# Patient Record
Sex: Male | Born: 1960 | Race: Black or African American | Hispanic: No | Smoking: Never smoker
Health system: Southern US, Community
[De-identification: ages and names within clinical notes are randomized; demographics above are authoritative.]

## PROBLEM LIST (undated history)

## (undated) DIAGNOSIS — T4145XA Adverse effect of unspecified anesthetic, initial encounter: Secondary | ICD-10-CM

## (undated) DIAGNOSIS — T8859XA Other complications of anesthesia, initial encounter: Secondary | ICD-10-CM

## (undated) DIAGNOSIS — I639 Cerebral infarction, unspecified: Secondary | ICD-10-CM

## (undated) DIAGNOSIS — I10 Essential (primary) hypertension: Secondary | ICD-10-CM

## (undated) DIAGNOSIS — D649 Anemia, unspecified: Secondary | ICD-10-CM

## (undated) DIAGNOSIS — I330 Acute and subacute infective endocarditis: Secondary | ICD-10-CM

## (undated) DIAGNOSIS — I480 Paroxysmal atrial fibrillation: Secondary | ICD-10-CM

## (undated) DIAGNOSIS — N289 Disorder of kidney and ureter, unspecified: Secondary | ICD-10-CM

## (undated) DIAGNOSIS — N183 Chronic kidney disease, stage 3 (moderate): Secondary | ICD-10-CM

## (undated) HISTORY — PX: DIALYSIS FISTULA CREATION: SHX611

---
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
## 2003-10-26 ENCOUNTER — Emergency Department (HOSPITAL_COMMUNITY): Admission: AD | Admit: 2003-10-26 | Discharge: 2003-10-26 | Payer: Self-pay | Admitting: Family Medicine

## 2005-01-10 ENCOUNTER — Ambulatory Visit: Payer: Self-pay | Admitting: Family Medicine

## 2005-02-14 ENCOUNTER — Ambulatory Visit: Payer: Self-pay | Admitting: Family Medicine

## 2005-03-07 ENCOUNTER — Ambulatory Visit: Payer: Self-pay | Admitting: Family Medicine

## 2005-04-18 ENCOUNTER — Ambulatory Visit: Payer: Self-pay | Admitting: Family Medicine

## 2005-05-03 ENCOUNTER — Ambulatory Visit: Payer: Self-pay

## 2005-10-16 ENCOUNTER — Ambulatory Visit: Payer: Self-pay | Admitting: Family Medicine

## 2013-09-17 ENCOUNTER — Inpatient Hospital Stay (HOSPITAL_COMMUNITY)
Admission: AD | Admit: 2013-09-17 | Discharge: 2013-09-23 | DRG: 673 | Disposition: A | Discharge status: Home / Self Care | Payer: Medicaid Other | Source: Other Acute Inpatient Hospital | Attending: Internal Medicine | Admitting: Internal Medicine

## 2013-09-17 DIAGNOSIS — E669 Obesity, unspecified: Secondary | ICD-10-CM | POA: Diagnosis present

## 2013-09-17 DIAGNOSIS — Z8249 Family history of ischemic heart disease and other diseases of the circulatory system: Secondary | ICD-10-CM

## 2013-09-17 DIAGNOSIS — N186 End stage renal disease: Secondary | ICD-10-CM | POA: Diagnosis present

## 2013-09-17 DIAGNOSIS — Z6836 Body mass index (BMI) 36.0-36.9, adult: Secondary | ICD-10-CM

## 2013-09-17 DIAGNOSIS — N2581 Secondary hyperparathyroidism of renal origin: Secondary | ICD-10-CM | POA: Diagnosis present

## 2013-09-17 DIAGNOSIS — D649 Anemia, unspecified: Secondary | ICD-10-CM | POA: Diagnosis present

## 2013-09-17 DIAGNOSIS — I10 Essential (primary) hypertension: Secondary | ICD-10-CM | POA: Diagnosis present

## 2013-09-17 DIAGNOSIS — Z992 Dependence on renal dialysis: Secondary | ICD-10-CM

## 2013-09-17 DIAGNOSIS — D638 Anemia in other chronic diseases classified elsewhere: Secondary | ICD-10-CM | POA: Diagnosis present

## 2013-09-17 DIAGNOSIS — N289 Disorder of kidney and ureter, unspecified: Secondary | ICD-10-CM | POA: Diagnosis present

## 2013-09-17 DIAGNOSIS — N179 Acute kidney failure, unspecified: Secondary | ICD-10-CM | POA: Diagnosis present

## 2013-09-17 DIAGNOSIS — I120 Hypertensive chronic kidney disease with stage 5 chronic kidney disease or end stage renal disease: Principal | ICD-10-CM | POA: Diagnosis present

## 2013-09-17 HISTORY — DX: Essential (primary) hypertension: I10

## 2013-09-17 HISTORY — DX: Chronic kidney disease, stage 3 (moderate): N18.3

## 2013-09-17 NOTE — Plan of Care (Signed)
Name: LUM PFAB MRN: [id number]  PCP: No PCP  HPI: Mr. Rasso is a 53 year old gentleman with history of hypertension, has been on lisinopril in the past.  Presented to [facility] with malaise and weakness.  Found to have elevated blood pressure of 250/143.  Patient received clonidine and ACE inhibitor for elevated blood pressure.  Later found to have abnormal labs with a creatinine of 14.3, previously 2012 was 1.88.  Anemic with a hemoglobin of 7.5, Previously was 13.  Requested transfer to [facility] for acute renal failure.  Dr. Justin Mend with nephrology notified. Had a borderline elevated troponin of 0.4, no EKG changes, normal sinus rhythm with LVH.  Vitals:Most recent 98.8, 97, 20, blood pressure 226/124, 99% on RA.  Labs: Sodium 142, potassium 5.3, chloride 106, CO2 18, BUN 90, creatinine 14.3 ( previously in 2012 was 1.88), glucose 83, WBC 8.8, hemoglobin 7.5, platelet count 187, normal liver function tests, INR 1.2, chest x-ray shows pulmonary vascular congestion.  Requested tests: urine drug screen and stool guaiac, still pending.  Bed request: Admit to step down, MC10.  Juanpablo Ciresi A, MD 09/17/2013, 9:06 PM

## 2013-09-18 ENCOUNTER — Inpatient Hospital Stay (HOSPITAL_COMMUNITY): Payer: Medicaid Other

## 2013-09-18 ENCOUNTER — Encounter (HOSPITAL_COMMUNITY): Payer: Self-pay | Admitting: Internal Medicine

## 2013-09-18 DIAGNOSIS — N179 Acute kidney failure, unspecified: Secondary | ICD-10-CM | POA: Diagnosis present

## 2013-09-18 DIAGNOSIS — N183 Chronic kidney disease, stage 3 unspecified: Secondary | ICD-10-CM

## 2013-09-18 DIAGNOSIS — T82898A Other specified complication of vascular prosthetic devices, implants and grafts, initial encounter: Secondary | ICD-10-CM

## 2013-09-18 DIAGNOSIS — I10 Essential (primary) hypertension: Secondary | ICD-10-CM | POA: Diagnosis present

## 2013-09-18 DIAGNOSIS — D649 Anemia, unspecified: Secondary | ICD-10-CM

## 2013-09-18 LAB — HEPATITIS C ANTIBODY (REFLEX): HCV Ab: NEGATIVE

## 2013-09-18 LAB — CBC WITH DIFFERENTIAL/PLATELET
BASOS PCT: 0 % (ref 0–1)
Basophils Absolute: 0 10*3/uL (ref 0.0–0.1)
EOS ABS: 0.1 10*3/uL (ref 0.0–0.7)
EOS PCT: 2 % (ref 0–5)
HEMATOCRIT: 20.5 % — AB (ref 39.0–52.0)
HEMOGLOBIN: 6.7 g/dL — AB (ref 13.0–17.0)
LYMPHS PCT: 14 % (ref 12–46)
Lymphs Abs: 1 10*3/uL (ref 0.7–4.0)
MCH: 27.1 pg (ref 26.0–34.0)
MCHC: 32.7 g/dL (ref 30.0–36.0)
MCV: 83 fL (ref 78.0–100.0)
Monocytes Absolute: 0.3 10*3/uL (ref 0.1–1.0)
Monocytes Relative: 4 % (ref 3–12)
NEUTROS ABS: 5.6 10*3/uL (ref 1.7–7.7)
Neutrophils Relative %: 80 % — ABNORMAL HIGH (ref 43–77)
Platelets: 190 10*3/uL (ref 150–400)
RBC: 2.47 MIL/uL — AB (ref 4.22–5.81)
RDW: 16.3 % — ABNORMAL HIGH (ref 11.5–15.5)
WBC: 7 10*3/uL (ref 4.0–10.5)

## 2013-09-18 LAB — BASIC METABOLIC PANEL
BUN: 93 mg/dL — ABNORMAL HIGH (ref 6–23)
CO2: 16 meq/L — AB (ref 19–32)
CREATININE: 13.85 mg/dL — AB (ref 0.50–1.35)
Calcium: 8.8 mg/dL (ref 8.4–10.5)
Chloride: 103 mEq/L (ref 96–112)
GFR calc non Af Amer: 4 mL/min — ABNORMAL LOW (ref >90)
GFR, EST AFRICAN AMERICAN: 4 mL/min — AB (ref >90)
GLUCOSE: 115 mg/dL — AB (ref 70–99)
POTASSIUM: 5.1 meq/L (ref 3.7–5.3)
Sodium: 139 mEq/L (ref 137–147)

## 2013-09-18 LAB — ABO/RH: ABO/RH(D): O POS

## 2013-09-18 LAB — URINE MICROSCOPIC-ADD ON

## 2013-09-18 LAB — URINALYSIS, ROUTINE W REFLEX MICROSCOPIC
Bilirubin Urine: NEGATIVE
Glucose, UA: NEGATIVE mg/dL
KETONES UR: NEGATIVE mg/dL
Leukocytes, UA: NEGATIVE
Nitrite: NEGATIVE
PROTEIN: 100 mg/dL — AB
Specific Gravity, Urine: 1.01 (ref 1.005–1.030)
Urobilinogen, UA: 0.2 mg/dL (ref 0.0–1.0)
pH: 6.5 (ref 5.0–8.0)

## 2013-09-18 LAB — TROPONIN I
Troponin I: <0.3 ng/mL (ref <0.30)
Troponin I: <0.3 ng/mL (ref <0.30)

## 2013-09-18 LAB — COMPREHENSIVE METABOLIC PANEL
ALBUMIN: 3.6 g/dL (ref 3.5–5.2)
ALK PHOS: 66 U/L (ref 39–117)
ALT: 20 U/L (ref 0–53)
AST: 9 U/L (ref 0–37)
BUN: 93 mg/dL — AB (ref 6–23)
CHLORIDE: 103 meq/L (ref 96–112)
CO2: 17 mEq/L — ABNORMAL LOW (ref 19–32)
CREATININE: 13.75 mg/dL — AB (ref 0.50–1.35)
Calcium: 8.9 mg/dL (ref 8.4–10.5)
GFR calc Af Amer: 4 mL/min — ABNORMAL LOW (ref >90)
GFR, EST NON AFRICAN AMERICAN: 4 mL/min — AB (ref >90)
Glucose, Bld: 115 mg/dL — ABNORMAL HIGH (ref 70–99)
POTASSIUM: 5.2 meq/L (ref 3.7–5.3)
Sodium: 139 mEq/L (ref 137–147)
Total Bilirubin: 0.4 mg/dL (ref 0.3–1.2)
Total Protein: 6.7 g/dL (ref 6.0–8.3)

## 2013-09-18 LAB — PROTEIN / CREATININE RATIO, URINE
CREATININE, URINE: 130.52 mg/dL
Protein Creatinine Ratio: 0.38 — ABNORMAL HIGH (ref 0.00–0.15)
Total Protein, Urine: 50.2 mg/dL

## 2013-09-18 LAB — PROTIME-INR
INR: 1.42 (ref 0.00–1.49)
PROTHROMBIN TIME: 17 s — AB (ref 11.6–15.2)

## 2013-09-18 LAB — IRON AND TIBC
Iron: 34 ug/dL — ABNORMAL LOW (ref 42–135)
Saturation Ratios: 15 % — ABNORMAL LOW (ref 20–55)
TIBC: 230 ug/dL (ref 215–435)
UIBC: 196 ug/dL (ref 125–400)

## 2013-09-18 LAB — RETICULOCYTES
RBC.: 2.48 MIL/uL — ABNORMAL LOW (ref 4.22–5.81)
RETIC COUNT ABSOLUTE: 27.3 10*3/uL (ref 19.0–186.0)
RETIC CT PCT: 1.1 % (ref 0.4–3.1)

## 2013-09-18 LAB — HEPATITIS B SURFACE ANTIGEN: HEP B S AG: NEGATIVE

## 2013-09-18 LAB — FOLATE: Folate: 9.4 ng/mL

## 2013-09-18 LAB — HIV ANTIBODY (ROUTINE TESTING W REFLEX): HIV: NONREACTIVE

## 2013-09-18 LAB — PREPARE RBC (CROSSMATCH)

## 2013-09-18 LAB — VITAMIN B12: VITAMIN B 12: 397 pg/mL (ref 211–911)

## 2013-09-18 LAB — MRSA PCR SCREENING: MRSA by PCR: NEGATIVE

## 2013-09-18 LAB — FERRITIN: FERRITIN: 63 ng/mL (ref 22–322)

## 2013-09-18 MED ORDER — DARBEPOETIN ALFA-POLYSORBATE 150 MCG/0.3ML IJ SOLN
150.0000 ug | INTRAMUSCULAR | Status: DC
  Filled 2013-09-18: qty 0.3

## 2013-09-18 MED ORDER — ONDANSETRON HCL 4 MG PO TABS
4.0000 mg | ORAL_TABLET | Freq: Four times a day (QID) | ORAL | Status: DC | PRN
  Administered 2013-09-18 – 2013-09-19 (×2): 4 mg via ORAL
  Filled 2013-09-18 (×2): qty 1

## 2013-09-18 MED ORDER — SODIUM CHLORIDE 0.9 % IV SOLN
100.0000 mL | INTRAVENOUS | Status: DC | PRN
Start: 2013-09-18 — End: 2013-09-18

## 2013-09-18 MED ORDER — ONDANSETRON HCL 4 MG/2ML IJ SOLN: 4.0000 mg | Freq: Four times a day (QID) | INTRAMUSCULAR | Status: DC | PRN

## 2013-09-18 MED ORDER — LIDOCAINE HCL (PF) 1 % IJ SOLN
5.0000 mL | INTRAMUSCULAR | Status: DC | PRN
Start: 2013-09-18 — End: 2013-09-18

## 2013-09-18 MED ORDER — CLONIDINE HCL 0.1 MG PO TABS
0.1000 mg | ORAL_TABLET | Freq: Three times a day (TID) | ORAL | Status: DC | PRN
  Administered 2013-09-18: 0.1 mg via ORAL
  Filled 2013-09-18: qty 1

## 2013-09-18 MED ORDER — LIDOCAINE-PRILOCAINE 2.5-2.5 % EX CREA
1.0000 | TOPICAL_CREAM | CUTANEOUS | Status: DC | PRN
Start: 2013-09-18 — End: 2013-09-18
  Filled 2013-09-18: qty 5

## 2013-09-18 MED ORDER — NEPRO/CARBSTEADY PO LIQD
237.0000 mL | Freq: Two times a day (BID) | ORAL | Status: DC | PRN
  Filled 2013-09-18: qty 237

## 2013-09-18 MED ORDER — AMLODIPINE BESYLATE 5 MG PO TABS
5.0000 mg | ORAL_TABLET | Freq: Every day | ORAL | Status: DC
  Administered 2013-09-18: 5 mg via ORAL
  Filled 2013-09-18: qty 1

## 2013-09-18 MED ORDER — HEPARIN SODIUM (PORCINE) 1000 UNIT/ML DIALYSIS: 1000.0000 [IU] | INTRAMUSCULAR | Status: DC | PRN

## 2013-09-18 MED ORDER — ACETAMINOPHEN 650 MG RE SUPP: 650.0000 mg | Freq: Four times a day (QID) | RECTAL | Status: DC | PRN

## 2013-09-18 MED ORDER — BOOST / RESOURCE BREEZE PO LIQD
1.0000 | Freq: Two times a day (BID) | ORAL | Status: DC
  Administered 2013-09-20 – 2013-09-23 (×4): 1 via ORAL

## 2013-09-18 MED ORDER — PENTAFLUOROPROP-TETRAFLUOROETH EX AERO
1.0000 | INHALATION_SPRAY | CUTANEOUS | Status: DC | PRN
Start: 2013-09-18 — End: 2013-09-18

## 2013-09-18 MED ORDER — HEPARIN SODIUM (PORCINE) 5000 UNIT/ML IJ SOLN
5000.0000 [IU] | Freq: Three times a day (TID) | INTRAMUSCULAR | Status: DC
  Administered 2013-09-18 – 2013-09-23 (×13): 5000 [IU] via SUBCUTANEOUS
  Filled 2013-09-18 (×19): qty 1

## 2013-09-18 MED ORDER — ALTEPLASE 2 MG IJ SOLR
2.0000 mg | Freq: Once | INTRAMUSCULAR | Status: DC | PRN
  Filled 2013-09-18: qty 2

## 2013-09-18 MED ORDER — AMLODIPINE BESYLATE 10 MG PO TABS
10.0000 mg | ORAL_TABLET | Freq: Every day | ORAL | Status: DC
  Administered 2013-09-19 – 2013-09-23 (×4): 10 mg via ORAL
  Filled 2013-09-18 (×5): qty 1

## 2013-09-18 MED ORDER — ACETAMINOPHEN 325 MG PO TABS
650.0000 mg | ORAL_TABLET | Freq: Four times a day (QID) | ORAL | Status: DC | PRN
  Filled 2013-09-18 (×2): qty 2

## 2013-09-18 MED ORDER — HYDRALAZINE HCL 20 MG/ML IJ SOLN
10.0000 mg | Freq: Four times a day (QID) | INTRAMUSCULAR | Status: DC | PRN
  Administered 2013-09-18 – 2013-09-19 (×2): 10 mg via INTRAVENOUS
  Filled 2013-09-18 (×2): qty 1

## 2013-09-18 MED ORDER — SODIUM CHLORIDE 0.9 % IV SOLN
INTRAVENOUS | Status: AC
  Administered 2013-09-18: 02:00:00 via INTRAVENOUS

## 2013-09-18 MED ORDER — NEPRO/CARBSTEADY PO LIQD
237.0000 mL | ORAL | Status: DC | PRN
  Filled 2013-09-18: qty 237

## 2013-09-18 MED ORDER — SODIUM CHLORIDE 0.9 % IV SOLN: 100.0000 mL | INTRAVENOUS | Status: DC | PRN

## 2013-09-18 NOTE — Progress Notes (Signed)
Pt taken to HD this am for cath placement & treatment, VSS.

## 2013-09-18 NOTE — Progress Notes (Signed)
INITIAL NUTRITION ASSESSMENT  DOCUMENTATION CODES Per approved criteria  -Obesity Unspecified   INTERVENTION: Provide Nepro with Carb Steady BID PRN Provide Resource Breeze BID RD to continue to monitor  NUTRITION DIAGNOSIS: Predicted suboptimal energy intake related to medical condition as evidenced by new CKD stage 5, nausea, and taste changes per chart.   Goal: Pt to meet >/= 90% of their estimated nutrition needs   Monitor:  PO intake, weight trends, labs, plan of care  Reason for Assessment: Malnutrition Screening Tool, score of 5  53 y.o. male  Admitting Dx: Acute renal failure  ASSESSMENT: 53 year old gentleman history of HTN many years with poor compliance and was injured at work in October. 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 He was out of BP meds for 2 weeks and states that since not taking he has had nausea and vomiting. He presented to the ER and labs demonstrated a creatinine of 14 and Hb of 7. He has complained for months of metalic taste in his mouth. Pt out of room for x-ray at time of visit. Per MD note, pt will likely need hemodialysis. No weight history on file. Pt reported recent weight loss and decreased appetite per MST.   Height: Ht Readings from Last 1 Encounters:  09/18/13 5\' 9"  (1.753 m)    Weight: Wt Readings from Last 1 Encounters:  09/18/13 248 lb 3.8 oz (112.6 kg)    Ideal Body Weight: 160 lbs  % Ideal Body Weight: 155%  Wt Readings from Last 10 Encounters:  09/18/13 248 lb 3.8 oz (112.6 kg)    Usual Body Weight: unknown  % Usual Body Weight: NA  BMI:  Body mass index is 36.64 kg/(m^2). (Obesity)  Estimated Nutritional Needs: Kcal: 2100-2300 Protein: 70-80 grams Fluid: 2.1-2.3 L/day  Skin: non-pitting RLE and LLE edema, intact  Diet Order: Renal  EDUCATION NEEDS: -Education not appropriate at this time   Intake/Output Summary (Last 24  hours) at 09/18/13 1450 Last data filed at 09/18/13 1201  Gross per 24 hour  Intake 1112.5 ml  Output   1300 ml  Net -187.5 ml    Last BM: PTA   Labs:   Recent Labs Lab 09/18/13 0100  NA 139  139  K 5.2  5.1  CL 103  103  CO2 17*  16*  BUN 93*  93*  CREATININE 13.75*  13.85*  CALCIUM 8.9  8.8  GLUCOSE 115*  115*    CBG (last 3)  No results found for this basename: GLUCAP,  in the last 72 hours  Scheduled Meds: . amLODipine  5 mg Oral Daily  . darbepoetin (ARANESP) injection - DIALYSIS  150 mcg Intravenous Q Sat-HD  . heparin  5,000 Units Subcutaneous Q8H    Continuous Infusions: . sodium chloride 75 mL/hr at 09/18/13 0600    Past Medical History  Diagnosis Date  . Hypertension   . CKD (chronic kidney disease), stage III     History reviewed. No pertinent past surgical history.  Pryor Ochoa RD, LDN Inpatient Clinical Dietitian Pager: [phone] After Hours Pager: [phone]

## 2013-09-18 NOTE — Procedures (Signed)
I have seen and examined this patient and agree with the plan of care .   Houda Brau W 09/18/2013, 9:06 AM

## 2013-09-18 NOTE — Progress Notes (Signed)
[facility] TEAM 1 - Stepdown/ICU TEAM Progress Note  Thomas Adams [id number] DOB: 1961/06/28 DOA: 09/17/2013 PCP: No PCP Per Patient  Admit HPI / Brief Narrative: 53 y.o. male with history of hypertension and chronic kidney disease stage III (baseline creatinine of 1.88 in 2012) who presented for elevated BP. Patient reported that 3 weeks prior he ran out of his lisinopril. 2 weeks prior he started feeling weak with nausea. He presented to [facility] to get a prescription for his blood pressure medications. In the ED he was found to be hypertensive at 250/143 and he was found to be in acute renal failure with a creatinine of 14.3. As [facility] did not have dialysis capabilities, he was transferred to [facility] for further management. He was also found to be anemic with a hemoglobin of 7.5.   HPI/Subjective: Pt seen for f/u visit.  Assessment/Plan:  Acute on chronic kidney failure   Malignant HTN  Anemia w/o evidence of blood loss  Obesity - Body mass index is 36.64 kg/(m^2).  Code Status: FULL Family Communication: no family present at time of exam Disposition Plan: SDU  Consultants: Nephrology  Procedures: 2/07 - R IJ HD catheter insertion   Antibiotics: None  DVT prophylaxis: SQ heparin   Objective: Blood pressure 179/104, pulse 86, temperature 98.3 F (36.8 C), temperature source Oral, resp. rate 20, height 5\' 9"  (1.753 m), weight 112.6 kg (248 lb 3.8 oz), SpO2 100.00%.  Intake/Output Summary (Last 24 hours) at 09/18/13 1532 Last data filed at 09/18/13 1201  Gross per 24 hour  Intake 1112.5 ml  Output   1300 ml  Net -187.5 ml   Exam: F/U exam completed  Data Reviewed: Basic Metabolic Panel:  Recent Labs Lab 09/18/13 0100  NA 139  139  K 5.2  5.1  CL 103  103  CO2 17*  16*  GLUCOSE 115*  115*  BUN 93*  93*  CREATININE 13.75*  13.85*  CALCIUM 8.9  8.8   Liver Function Tests:  Recent Labs Lab 09/18/13 0100  AST 9    ALT 20  ALKPHOS 66  BILITOT 0.4  PROT 6.7  ALBUMIN 3.6   No results found for this basename: LIPASE, AMYLASE,  in the last 168 hours No results found for this basename: AMMONIA,  in the last 168 hours  CBC:  Recent Labs Lab 09/18/13 0100  WBC 7.0  NEUTROABS 5.6  HGB 6.7*  HCT 20.5*  MCV 83.0  PLT 190   Cardiac Enzymes:  Recent Labs Lab 09/18/13 0100 09/18/13 0703  TROPONINI <0.30 <0.30    Recent Results (from the past 240 hour(s))  MRSA PCR SCREENING     Status: None   Collection Time    09/18/13 12:34 AM      Result Value Range Status   MRSA by PCR NEGATIVE  NEGATIVE Final   Comment:            The GeneXpert MRSA Assay (FDA     approved for NASAL specimens     only), is one component of a     comprehensive MRSA colonization     surveillance program. It is not     intended to diagnose MRSA     infection nor to guide or     monitor treatment for     MRSA infections.     Studies:  Recent x-ray studies have been reviewed in detail by the Attending Physician  Scheduled Meds:  Scheduled Meds: . amLODipine  5 mg Oral Daily  . darbepoetin (ARANESP) injection - DIALYSIS  150 mcg Intravenous Q Sat-HD  . feeding supplement (RESOURCE BREEZE)  1 Container Oral BID WC  . heparin  5,000 Units Subcutaneous Q8H    Time spent on care of this patient: 25+ mins   [facility] Office  [phone] Pager - Text Page per Shea Evans as per below:  On-Call/Text Page:      [email or web address]      password [email or web address]  If 7PM-7AM, please contact night-coverage [email or web address] Password [email or web address] 09/18/2013, 3:32 PM   LOS: 1 day

## 2013-09-18 NOTE — Consult Note (Signed)
Referring Provider: No ref. provider found Primary Care Physician:  No PCP Per Patient Primary Nephrologist:  None  Reason for Consultation:  Management of renal insufficiency  CKD stage 5 Anemia and secondary Hyperparathyroidism, volume overload and HTN  HPI: 53 year old gentleman history of HTN many years with poor compliance and was injured at work in October. 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 He was out of BP meds for 2 weeks and states that since not taking he has had nausea and vomiting. He presented to the ER and labs demonstrated a creatinine of 14 and Hb of 7.  He has complained for months of metalic taste in his mouth  Past Medical History  Diagnosis Date  . Hypertension   . CKD (chronic kidney disease), stage III     History reviewed. No pertinent past surgical history.  Prior to Admission medications   Not on File    Current Facility-Administered Medications  Medication Dose Route Frequency Provider Last Rate Last Dose  . 0.9 %  sodium chloride infusion   Intravenous Continuous Bynum Bellows, MD 75 mL/hr at 09/18/13 0600    . acetaminophen (TYLENOL) tablet 650 mg  650 mg Oral Q6H PRN Bynum Bellows, MD       Or  . acetaminophen (TYLENOL) suppository 650 mg  650 mg Rectal Q6H PRN Bynum Bellows, MD      . amLODipine (NORVASC) tablet 5 mg  5 mg Oral Daily Bynum Bellows, MD   5 mg at 09/18/13 0246  . cloNIDine (CATAPRES) tablet 0.1 mg  0.1 mg Oral TID PRN Bynum Bellows, MD      . heparin injection 5,000 Units  5,000 Units Subcutaneous Q8H Bynum Bellows, MD   5,000 Units at 09/18/13 0543  . hydrALAZINE (APRESOLINE) injection 10 mg  10 mg Intravenous Q6H PRN Bynum Bellows, MD      . ondansetron [facility]) tablet 4 mg  4 mg Oral Q6H PRN Bynum Bellows, MD       Or  . ondansetron (ZOFRAN) injection 4 mg  4 mg Intravenous Q6H PRN Bynum Bellows, MD        Allergies as of 09/17/2013  .  (Not on File)    Family History  Problem Relation Age of Onset  . Hypertension Father     History   Social History  . Marital Status: Divorced    Spouse Name: N/A    Number of Children: N/A  . Years of Education: N/A   Occupational History  . Not on file.   Social History Main Topics  . Smoking status: Never Smoker   . Smokeless tobacco: Not on file  . Alcohol Use: No  . Drug Use: Not on file  . Sexual Activity: Not on file   Other Topics Concern  . Not on file   Social History Narrative  . No narrative on file    Review of Systems: Gen: Denies any fever, chills, sweats, + anorexia, + fatigue, + weakness, + malaise,  HEENT: No visual complaints, No history of Retinopathy. Normal external appearance No Epistaxis or Sore throat. No sinusitis.   CV: Denies chest pain, angina, palpitations, syncope, orthopnea, PND, peripheral edema, and claudication. Resp: Denies dyspnea at rest, dyspnea with exercise, cough, sputum, wheezing, coughing up blood, and pleurisy. GI: + vomiting + nausea GU : Denies urinary burning, blood in urine, urinary  frequency, urinary hesitancy, nocturnal urination, and urinary incontinence.  No renal calculi. MS: Back Pain. Derm: Denies rash, itching, dry skin, hives, moles, warts, or unhealing ulcers.  Psych: Denies depression, anxiety, memory loss, suicidal ideation, hallucinations, paranoia, and confusion. Heme: Denies bruising, bleeding, and enlarged lymph nodes. Neuro: No headache.  No diplopia. No dysarthria.  No dysphasia.  No history of CVA.  No Seizures. No paresthesias.  No weakness. Endocrine No DM.  No Thyroid disease.  No Adrenal disease.  Physical Exam: Vital signs in last 24 hours: Temp:  [97.6 F (36.4 C)-99.2 F (37.3 C)] 99.1 F (37.3 C) (02/07 0819) Pulse Rate:  [75-83] 76 (02/07 0803) Resp:  [16-26] 18 (02/07 0803) BP: (143-161)/(79-99) 157/99 mmHg (02/07 0803) SpO2:  [99 %-100 %] 100 % (02/07 0532) Weight:  [111 kg (244 lb  11.4 oz)-112 kg (246 lb 14.6 oz)] 112 kg (246 lb 14.6 oz) (02/07 0358)   General:   Alert,  Well-developed, well-nourished, pleasant and cooperative in NAD Head:  Normocephalic and atraumatic. Eyes:  Sclera clear, no icterus.   Conjunctiva pink. Ears:  Normal auditory acuity. Nose:  No deformity, discharge,  or lesions. Mouth:  No deformity or lesions, dentition normal. Neck:  Supple; no masses or thyromegaly. JVP not elevated Lungs:  Clear throughout to auscultation.   No wheezes, crackles, or rhonchi. No acute distress. Heart:  Regular rate and rhythm; no murmurs, clicks, rubs,  or gallops. Abdomen:  Soft, nontender and nondistended. No masses, hepatosplenomegaly or hernias noted. Normal bowel sounds, without guarding, and without rebound.   Msk:  Symmetrical without gross deformities. Normal posture. Pulses:  No carotid, renal, femoral bruits. DP and PT symmetrical and equal Extremities:  Without clubbing or edema. Neurologic:  Alert and  oriented x4;  grossly normal neurologically. Skin:  Intact without significant lesions or rashes. Cervical Nodes:  No significant cervical adenopathy. Psych:  Alert and cooperative. Normal mood and affect.  Intake/Output from previous day: 02/06 0701 - 02/07 0700 In: 992.5 [P.O.:480; I.V.:375; Blood:137.5] Out: -  Intake/Output this shift: Total I/O In: 120 [P.O.:120] Out: 300 [Urine:300]  Lab Results:  Recent Labs  09/18/13 0100  WBC 7.0  HGB 6.7*  HCT 20.5*  PLT 190   BMET  Recent Labs  09/18/13 0100  NA 139  139  K 5.2  5.1  CL 103  103  CO2 17*  16*  GLUCOSE 115*  115*  BUN 93*  93*  CREATININE 13.75*  13.85*  CALCIUM 8.9  8.8   LFT  Recent Labs  09/18/13 0100  PROT 6.7  ALBUMIN 3.6  AST 9  ALT 20  ALKPHOS 66  BILITOT 0.4   PT/INR  Recent Labs  09/18/13 0100  LABPROT 17.0*  INR 1.42   Hepatitis Panel No results found for this basename: HEPBSAG, HCVAB, HEPAIGM, HEPBIGM,  in the last 72  hours  Studies/Results: Dg Chest Port 1 View  09/18/2013   CLINICAL DATA:  Status post temporary dialysis catheter placement  EXAM: PORTABLE CHEST - 1 VIEW  COMPARISON:  09/17/2013  FINDINGS: Cardiac shadow is stable. A new right jugular dialysis catheter is noted with the tip at the cavoatrial junction. Mild vascular congestion is seen likely related to volume overload. No bony abnormality is noted. No pneumothorax is seen.  IMPRESSION: No pneumothorax following dialysis catheter placement.  Vascular congestion consistent with volume overload.   Electronically Signed   By: Inez Catalina M.D.   On: 09/18/2013 09:01    Assessment/Plan:  Newly  recognized chronic renal disease stage V  Will check some serologies SPEP UPEP HIV and Hepatitis and renal U/S  HTN continue antihypertensives and challenge fluid  Anemia chronic check Iron studies start aranesp  Bones check PTH calcium and phosphorus  Will need permcath and AVF next week   LOS: 1 Ravon Mcilhenny W @TODAY @9 :09 AM

## 2013-09-18 NOTE — Progress Notes (Signed)
CRITICAL VALUE ALERT  Critical value received:  Hemoglobin 6.7   Date of notification:  09/18/2013  Time of notification:  0152  Critical value read back:yes  Nurse who received alert:  K. Royden Purl  MD notified (1st page):  Rogue Bussing, NP  Time of first page:  0155  MD notified (2nd page):  Time of second page:  Responding MD:  Rogue Bussing, NP  Time MD responded:  0200  NP at bedside to discuss transfusion.

## 2013-09-18 NOTE — Progress Notes (Signed)
09/18/2013 3:48 PM Hemodialysis Outpatient Note; I have initiated the CLIP process for outpatient placement,however I am unsure whether the patient has primary insurance. I have called and left a message for the financial counselor to advise whether emergency medicaid is indicated. I will follow up on Monday February 9. Thank you. Gordy Savers

## 2013-09-18 NOTE — Consult Note (Addendum)
Referred by:  Dr. Justin Mend (Nephrology)  Reason for referral: New access  History of Present Illness  Thomas Adams is a 53 y.o. (01/26/1961) male who presents for evaluation for permanent access.  The patient is right hand dominant.  The patient has not had previous access procedures.  Previous central venous cannulation procedures include: RIJ temp catheter.  The patient has never had a PPM placed.  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  The patient is admitted currently for malignant HTN.  Temp dialysis catheter was placed in RIJV.  Past Medical History  Diagnosis Date  . Hypertension   . CKD (chronic kidney disease), stage III    Past Surgical History: none  History   Social History  . Marital Status: Divorced    Spouse Name: N/A    Number of Children: N/A  . Years of Education: N/A   Occupational History  . Not on file.   Social History Main Topics  . Smoking status: Never Smoker   . Smokeless tobacco: Not on file  . Alcohol Use: No  . Drug Use: Not on file  . Sexual Activity: Not on file   Other Topics Concern  . Not on file   Social History Narrative  . No narrative on file    Family History  Problem Relation Age of Onset  . Hypertension Father    Current Facility-Administered Medications  Medication Dose Route Frequency Provider Last Rate Last Dose  . 0.9 %  sodium chloride infusion   Intravenous Continuous Cherene Altes, MD 75 mL/hr at 09/18/13 0600    . acetaminophen (TYLENOL) tablet 650 mg  650 mg Oral Q6H PRN Bynum Bellows, MD       Or  . acetaminophen (TYLENOL) suppository 650 mg  650 mg Rectal Q6H PRN Bynum Bellows, MD      . Derrill Memo ON 09/19/2013] amLODipine (NORVASC) tablet 10 mg  10 mg Oral Daily Cherene Altes, MD      . cloNIDine (CATAPRES) tablet 0.1 mg  0.1 mg Oral TID PRN Bynum Bellows, MD      . darbepoetin Kyra Searles) injection 150 mcg  150 mcg Intravenous Q Sat-HD Sherril Croon, MD      . feeding supplement (NEPRO CARB STEADY) liquid 237 mL   237 mL Oral BID PRN Baird Lyons, RD      . feeding supplement (RESOURCE BREEZE) (RESOURCE BREEZE) liquid 1 Container  1 Container Oral BID WC Baird Lyons, RD      . heparin injection 5,000 Units  5,000 Units Subcutaneous Q8H Bynum Bellows, MD   5,000 Units at 09/18/13 1500  . hydrALAZINE (APRESOLINE) injection 10 mg  10 mg Intravenous Q6H PRN Bynum Bellows, MD   10 mg at 09/18/13 1801  . ondansetron (ZOFRAN) tablet 4 mg  4 mg Oral Q6H PRN Bynum Bellows, MD       Or  . ondansetron (ZOFRAN) injection 4 mg  4 mg Intravenous Q6H PRN Bynum Bellows, MD        Allergies  Allergen Reactions  . Penicillins Nausea Only    Nausea and dizziness   REVIEW OF SYSTEMS:  (Positives checked otherwise negative)  CARDIOVASCULAR:  []  chest pain, []  chest pressure, []  palpitations, []  shortness of breath when laying flat, []  shortness of breath with exertion,  []  pain in feet when walking, []  pain in feet when laying flat, []  history of blood clot in veins (DVT), []   history of phlebitis, []  swelling in legs, []  varicose veins  PULMONARY:  []  productive cough, []  asthma, []  wheezing  NEUROLOGIC:  []  weakness in arms or legs, []  numbness in arms or legs, []  difficulty speaking or slurred speech, []  temporary loss of vision in one eye, []  dizziness  HEMATOLOGIC:  []  bleeding problems, []  problems with blood clotting too easily  MUSCULOSKEL:  []  joint pain, []  joint swelling  GASTROINTEST:  []  vomiting blood, []  blood in stool     GENITOURINARY:  []  burning with urination, []  blood in urine  PSYCHIATRIC:  []  history of major depression  INTEGUMENTARY:  []  rashes, []  ulcers  CONSTITUTIONAL:  []  fever, []  chills  Physical Examination  Filed Vitals:   09/18/13 1100 09/18/13 1130 09/18/13 1201 09/18/13 1600  BP: 163/97 170/98 179/104   Pulse: 87 92 86   Temp:   98.3 F (36.8 C) 98.3 F (36.8 C)  TempSrc:   Oral Oral  Resp:  20 20   Height:      Weight:   248 lb 3.8 oz (112.6 kg)     SpO2:   100%    Body mass index is 36.64 kg/(m^2).  General: A&O x 3, WD, obese  Head: [place]/AT  Ear/Nose/Throat: Hearing grossly intact, nares w/o erythema or drainage, oropharynx w/o Erythema/Exudate, Mallampati score: 3  Eyes: PERRLA, EOMI  Neck: Supple, no nuchal rigidity, no palpable LAD  Pulmonary: Sym exp, good air movt, CTAB, no rales, rhonchi, & wheezing  Cardiac: RRR, Nl S1, S2, no Murmurs, rubs or gallops  Vascular: Vessel Right Left  Radial Palpable Palpable  Ulnar Faintly Palpable Faintly Palpable  Brachial Palpable Palpable  Carotid Palpable, without bruit Palpable, without bruit  Aorta Not palpable N/A  Femoral Palpable Palpable  Popliteal Not palpable Not palpable  PT Palpable Palpable  DP Palpable Palpable   Gastrointestinal: soft, NTND, -G/R, - HSM, - masses, - CVAT B  Musculoskeletal: M/S 5/5 throughout , Extremities without ischemic changes , antecubital IV in place bilaterally  Neurologic: CN 2-12 intact , Pain and light touch intact in extremities , Motor exam as listed above  Psychiatric: Judgment intact, Mood & affect appropriate for pt's clinical situation  Dermatologic: See M/S exam for extremity exam, no rashes otherwise noted  Lymph : No Cervical, Axillary, or Inguinal lymphadenopathy   Laboratory: CBC:    Component Value Date/Time   WBC 7.0 09/18/2013 0100   RBC 2.47* 09/18/2013 0100   RBC 2.48* 09/18/2013 0100   HGB 6.7* 09/18/2013 0100   HCT 20.5* 09/18/2013 0100   PLT 190 09/18/2013 0100   MCV 83.0 09/18/2013 0100   MCH 27.1 09/18/2013 0100   MCHC 32.7 09/18/2013 0100   RDW 16.3* 09/18/2013 0100   LYMPHSABS 1.0 09/18/2013 0100   MONOABS 0.3 09/18/2013 0100   EOSABS 0.1 09/18/2013 0100   BASOSABS 0.0 09/18/2013 0100    BMP:    Component Value Date/Time   NA 139 09/18/2013 0100   NA 139 09/18/2013 0100   K 5.2 09/18/2013 0100   K 5.1 09/18/2013 0100   CL 103 09/18/2013 0100   CL 103 09/18/2013 0100   CO2 17* 09/18/2013 0100   CO2 16* 09/18/2013 0100   GLUCOSE  115* 09/18/2013 0100   GLUCOSE 115* 09/18/2013 0100   BUN 93* 09/18/2013 0100   BUN 93* 09/18/2013 0100   CREATININE 13.75* 09/18/2013 0100   CREATININE 13.85* 09/18/2013 0100   CALCIUM 8.9 09/18/2013 0100   CALCIUM 8.8 09/18/2013 0100  GFRNONAA 4* 09/18/2013 0100   GFRNONAA 4* 09/18/2013 0100   GFRAA 4* 09/18/2013 0100   GFRAA 4* 09/18/2013 0100    Coagulation: Lab Results  Component Value Date   INR 1.42 09/18/2013   No results found for this basename: PTT    Radiology: US Renal  09/18/2013   CLINICAL DATA:  Chronic kidney disease, stage III.  EXAM: RENAL/URINARY TRACT ULTRASOUND COMPLETE  COMPARISON:  Abdominal CT 09/17/2013  FINDINGS: Right Kidney:  Length: 11.6 cm. There are small round hypoechoic structures in the right kidney mid and upper pole region. One measures 1.3 cm and the other measures up to 1.2 cm. There is a hypoechoic exophytic structure in the right kidney lower pole that measures 2.8 x 2.0 x 2.7 cm. Structure is predominantly hypoechoic but there are internal echogenic areas and the structure does not clearly have acoustic enhancement. This lesion corresponds with the hyperdense structure on the previous CT. Negative for hydronephrosis.  Left Kidney:  Length: 12.3 cm. Echogenicity within normal limits. No mass or hydronephrosis visualized.  Bladder:  Small amount of fluid in the urinary bladder. The prostate is prominent measuring 4.5 x 3.6 x 3.7 cm.  IMPRESSION: Negative for hydronephrosis.  There is a heterogeneous structure in the right kidney lower pole that may represent a complex cyst but indeterminate. Due to the chronic kidney disease, the patient is probably not a candidate for a pre and post-contrast MRI for more definitive evaluation. This lesion could be further evaluated with a non contrast MRI or follow-up with ultrasound.   Electronically Signed   By: Markus Daft M.D.   On: 09/18/2013 15:28   Dg Chest Port 1 View  09/18/2013   CLINICAL DATA:  Status post temporary dialysis catheter  placement  EXAM: PORTABLE CHEST - 1 VIEW  COMPARISON:  09/17/2013  FINDINGS: Cardiac shadow is stable. A new right jugular dialysis catheter is noted with the tip at the cavoatrial junction. Mild vascular congestion is seen likely related to volume overload. No bony abnormality is noted. No pneumothorax is seen.  IMPRESSION: No pneumothorax following dialysis catheter placement.  Vascular congestion consistent with volume overload.   Electronically Signed   By: Inez Catalina M.D.   On: 09/18/2013 09:01   Medical Decision Making  Thomas Adams is a 53 y.o. male who presents with ESRD requiring hemodialysis, malignant HTN   BUE vein mapping ordered.  Protect L arm and remove IV  His dialysis options will be determined once the vein mapping is available.   Will aim for [facility] placement and L arm access placement this coming Tuesday/Wednesday, as I suspect the vein mapping might not be done by tomorrow.  Also poorly controlled HTN will likely result in cancellation by Anesthesiology, so I suspect his BP control will take another day or two to optimize.  Adele Barthel, MD [facility] Office: [phone] Pager: [phone]  09/18/2013, 6:19 PM

## 2013-09-18 NOTE — Procedures (Signed)
Placement of right IJ dialysis catheter  20 cm  Local lidocaine anesthetic  Diagnosis Chronic renal failure stage 5  Now end stage renal disease  Seldinger aseptic technique  Blood loss 10 cc  No complications

## 2013-09-18 NOTE — H&P (Addendum)
Patient's PCP: No PCP Per Patient  Chief Complaint: Elevated blood pressure  History of Present Illness: Thomas Adams is a 53 y.o. African American male with history of hypertension has been on lisinopril in the past, and chronic kidney disease stage III baseline creatinine of 1.88 in 2012 who presented with the above complaints.  Patient reports that about 3 weeks ago he ran out of his lisinopril.  2 weeks ago he started feeling weak with nausea.  He presented to [facility] to get a prescription for his blood pressure medications.  In the emergency department he was found to be hypertensive 250/143 and he was found to be in acute renal failure with a creatinine of 14.3.  As [facility] did not have dialysis capabilities, he was transferred to [facility] hospital for further management.  He was also found to be anemic with a hemoglobin of 7.5. He denies any recent fevers, chills, chest pain, shortness of breath, abdominal pain, diarrhea, headaches or vision changes.  He does admit to feeling nauseated in the morning and has been vomiting frequently, denies any blood in his emesis.  Denies any overt bleeding or blood in stools.  Review of Systems: All systems reviewed with the patient and positive as per history of present illness, otherwise all other systems are negative.  Past Medical History  Diagnosis Date  . Hypertension   . CKD (chronic kidney disease), stage III    History reviewed. No pertinent past surgical history. Family History  Problem Relation Age of Onset  . Hypertension Father    History   Social History  . Marital Status: Divorced    Spouse Name: N/A    Number of Children: N/A  . Years of Education: N/A   Occupational History  . Not on file.   Social History Main Topics  . Smoking status: Never Smoker   . Smokeless tobacco: Not on file  . Alcohol Use: No  . Drug Use: Not on file  . Sexual Activity: Not on file   Other Topics Concern  . Not on file    Social History Narrative  . No narrative on file   Allergies: Penicillins  Home Meds: Prior to Admission medications   Not on File    Physical Exam: Blood pressure 161/94, pulse 77, temperature 99 F (37.2 C), temperature source Oral, resp. rate 16, height 5\' 9"  (1.753 m), weight 111 kg (244 lb 11.4 oz), SpO2 100.00%. General: Awake, Oriented x3, No acute distress. HEENT: EOMI, Moist mucous membranes Neck: Supple CV: S1 and S2 Lungs: Clear to ascultation bilaterally Abdomen: Soft, Nontender, Nondistended, +bowel sounds. Ext: Good pulses. Trace edema. No clubbing or cyanosis noted. Neuro: Cranial Nerves II-XII grossly intact. Has 5/5 motor strength in upper and lower extremities.  Lab results: No results found for this basename: NA, K, CL, CO2, GLUCOSE, BUN, CREATININE, CALCIUM, MG, PHOS,  in the last 72 hours No results found for this basename: AST, ALT, ALKPHOS, BILITOT, PROT, ALBUMIN,  in the last 72 hours No results found for this basename: LIPASE, AMYLASE,  in the last 72 hours No results found for this basename: WBC, NEUTROABS, HGB, HCT, MCV, PLT,  in the last 72 hours No results found for this basename: CKTOTAL, CKMB, CKMBINDEX, TROPONINI,  in the last 72 hours No components found with this basename: POCBNP,  No results found for this basename: DDIMER,  in the last 72 hours No results found for this basename: HGBA1C,  in the last 72 hours No results  found for this basename: CHOL, HDL, LDLCALC, TRIG, CHOLHDL, LDLDIRECT,  in the last 72 hours No results found for this basename: TSH, T4TOTAL, FREET3, T3FREE, THYROIDAB,  in the last 72 hours No results found for this basename: VITAMINB12, FOLATE, FERRITIN, TIBC, IRON, RETICCTPCT,  in the last 72 hours  Labs from [facility]:  Sodium 142, potassium 5.3, chloride 106, CO2 18, BUN 90, creatinine 14.3 ( previously in 2012 was 1.88), glucose 83, WBC 8.8, hemoglobin 7.5, platelet count 187, normal liver function tests, INR  1.2  Imaging results:  Reports from [facility]: CT of abdomen and pelvis without contrast shows mild bilateral perinephric stranding, nonspecific.  No hydronephrosis, renal or ureteral stones.    Chest x-ray shows progressive cardiomegaly and pulmonary vascular congestion, mild chronic interstitial lung disease with resolved interstitial pulmonary edema.    Head CT shows findings consistent with small vessel white matter ischemic changes.  Other results: EKG: EKG at [facility] shows normal sinus rhythm with heart rate of 90, LVH with slightly peaked T. waves in leads V3, V4, and V5.  Assessment & Plan by Problem: Acute renal failure on chronic kidney disease stage III, now progressed possibly to end-stage renal disease Likely due to chronic uncontrolled hypertension.  CT of abdomen and pelvis without contrast shows no hydronephrosis.  Discussed with Dr. Justin Mend, nephrology who will evaluate the patient in the morning.  Given patient's significantly elevated creatinine, likely will need dialysis.  This was discussed patient.  Further workup and management as per nephrology.  Malignant/uncontrolled hypertension Blood pressure improved after being given labetalol.  When necessary hydralazine and clonidine for systolic blood pressure greater than 180 for the first 24 hours then greater than 160.  Started the patient on amlodipine.  Allow for permissive hypertension for the first 24 hours given chronically elevated blood pressure, after the first 24 hours then attempt to control his blood pressure as tolerated.  Further titration of antihypertensive medications depending on patient's clinical course.  Admitted the patient stepped down, thinking he may need to be placed on a drip for uncontrolled hypertension.  Cycle cardiac enzymes, initial troponin at the facility was 0.11, reference range less than 0.44, likely elevated due to acute renal failure.  Hold aspirin or anticoagulation given  anemia, do not suspect ACS at this time.  Anemia Stool guaiac in the emergency department was negative for blood.  Guaiac stool as needed.  Check anemia panel.  Strongly suspect anemia likely related to Acute renal failure with possible progression to end-stage renal disease.  If hemoglobin is less than 7.0 then transfuse blood.  Prophylaxis Subcutaneous heparin.  CODE STATUS Full code.  Disposition Admit the patient as inpatient to step down.  Time spent on admission, talking to the patient, and coordinating care was: 50 mins.  Dorethia Jeanmarie A, MD 09/18/2013, 1:03 AM

## 2013-09-19 LAB — RENAL FUNCTION PANEL
Albumin: 3.2 g/dL — ABNORMAL LOW (ref 3.5–5.2)
BUN: 70 mg/dL — ABNORMAL HIGH (ref 6–23)
CHLORIDE: 100 meq/L (ref 96–112)
CO2: 19 meq/L (ref 19–32)
CREATININE: 10.43 mg/dL — AB (ref 0.50–1.35)
Calcium: 8.3 mg/dL — ABNORMAL LOW (ref 8.4–10.5)
GFR calc Af Amer: 6 mL/min — ABNORMAL LOW (ref >90)
GFR calc non Af Amer: 5 mL/min — ABNORMAL LOW (ref >90)
Glucose, Bld: 103 mg/dL — ABNORMAL HIGH (ref 70–99)
Phosphorus: 4.6 mg/dL (ref 2.3–4.6)
Potassium: 4.4 mEq/L (ref 3.7–5.3)
Sodium: 137 mEq/L (ref 137–147)

## 2013-09-19 LAB — CBC
HEMATOCRIT: 23.1 % — AB (ref 39.0–52.0)
HEMOGLOBIN: 7.7 g/dL — AB (ref 13.0–17.0)
MCH: 27.7 pg (ref 26.0–34.0)
MCHC: 33.3 g/dL (ref 30.0–36.0)
MCV: 83.1 fL (ref 78.0–100.0)
Platelets: 169 10*3/uL (ref 150–400)
RBC: 2.78 MIL/uL — AB (ref 4.22–5.81)
RDW: 15.8 % — ABNORMAL HIGH (ref 11.5–15.5)
WBC: 7.7 10*3/uL (ref 4.0–10.5)

## 2013-09-19 LAB — TYPE AND SCREEN
ABO/RH(D): O POS
Antibody Screen: NEGATIVE
UNIT DIVISION: 0

## 2013-09-19 LAB — HEPATITIS B SURFACE ANTIBODY,QUALITATIVE: Hep B S Ab: NEGATIVE

## 2013-09-19 LAB — HEPATITIS B CORE ANTIBODY, TOTAL: Hep B Core Total Ab: NONREACTIVE

## 2013-09-19 MED ORDER — CLONIDINE HCL 0.1 MG PO TABS
0.2000 mg | ORAL_TABLET | Freq: Three times a day (TID) | ORAL | Status: DC | PRN
  Filled 2013-09-19: qty 1

## 2013-09-19 MED ORDER — HYDRALAZINE HCL 10 MG PO TABS
10.0000 mg | ORAL_TABLET | Freq: Three times a day (TID) | ORAL | Status: DC
  Administered 2013-09-19 – 2013-09-23 (×11): 10 mg via ORAL
  Filled 2013-09-19 (×18): qty 1

## 2013-09-19 MED ORDER — ISOSORBIDE DINITRATE 10 MG PO TABS
10.0000 mg | ORAL_TABLET | Freq: Three times a day (TID) | ORAL | Status: DC
  Administered 2013-09-19 – 2013-09-23 (×12): 10 mg via ORAL
  Filled 2013-09-19 (×15): qty 1

## 2013-09-19 MED ORDER — PANTOPRAZOLE SODIUM 40 MG PO TBEC
40.0000 mg | DELAYED_RELEASE_TABLET | Freq: Every day | ORAL | Status: DC
  Administered 2013-09-19 – 2013-09-23 (×4): 40 mg via ORAL
  Filled 2013-09-19 (×3): qty 1

## 2013-09-19 MED ORDER — LABETALOL HCL 200 MG PO TABS
200.0000 mg | ORAL_TABLET | Freq: Two times a day (BID) | ORAL | Status: DC
  Administered 2013-09-19 – 2013-09-23 (×9): 200 mg via ORAL
  Filled 2013-09-19 (×11): qty 1

## 2013-09-19 NOTE — Progress Notes (Signed)
[facility] ROUNDING NOTE   Subjective:   Interval History:no new complaints  Quiet and asking appropriate questions . Still shocked about state of kidneys but appears to be processing the information. Watched dialysis video   Objective:  Vital signs in last 24 hours:  Temp:  [98 F (36.7 C)-98.6 F (37 C)] 98.6 F (37 C) (02/08 1230) Pulse Rate:  [84-92] 88 (02/08 1230) Resp:  [19-32] 25 (02/08 1230) BP: (158-187)/(81-105) 182/103 mmHg (02/08 1230) SpO2:  [98 %-99 %] 98 % (02/08 0812) Weight:  [113.4 kg (250 lb)] 113.4 kg (250 lb) (02/08 0334)  Weight change: 2.4 kg (5 lb 4.7 oz) Filed Weights   09/18/13 1000 09/18/13 1201 09/19/13 0334  Weight: 113.4 kg (250 lb) 112.6 kg (248 lb 3.8 oz) 113.4 kg (250 lb)    Intake/Output: I/O last 3 completed shifts: In: 1112.5 [P.O.:600; I.V.:375; Blood:137.5] Out: 2150 [Urine:1150; Other:1000]   Intake/Output this shift:  Total I/O In: 360 [P.O.:360] Out: 225 [Urine:225]  CVS- RRR RS- CTA Right IJ ABD- BS present soft non-distended EXT- no edema   Basic Metabolic Panel:  Recent Labs Lab 09/18/13 0100 09/19/13 0328  NA 139  139 137  K 5.2  5.1 4.4  CL 103  103 100  CO2 17*  16* 19  GLUCOSE 115*  115* 103*  BUN 93*  93* 70*  CREATININE 13.75*  13.85* 10.43*  CALCIUM 8.9  8.8 8.3*  PHOS  --  4.6    Liver Function Tests:  Recent Labs Lab 09/18/13 0100 09/19/13 0328  AST 9  --   ALT 20  --   ALKPHOS 66  --   BILITOT 0.4  --   PROT 6.7  --   ALBUMIN 3.6 3.2*   No results found for this basename: LIPASE, AMYLASE,  in the last 168 hours No results found for this basename: AMMONIA,  in the last 168 hours  CBC:  Recent Labs Lab 09/18/13 0100 09/19/13 0328  WBC 7.0 7.7  NEUTROABS 5.6  --   HGB 6.7* 7.7*  HCT 20.5* 23.1*  MCV 83.0 83.1  PLT 190 169    Cardiac Enzymes:  Recent Labs Lab 09/18/13 0100 09/18/13 0703  TROPONINI <0.30 <0.30    BNP: No components found with this  basename: POCBNP,   CBG: No results found for this basename: GLUCAP,  in the last 168 hours  Microbiology: Results for orders placed during the hospital encounter of 09/17/13  MRSA PCR SCREENING     Status: None   Collection Time    09/18/13 12:34 AM      Result Value Range Status   MRSA by PCR NEGATIVE  NEGATIVE Final   Comment:            The GeneXpert MRSA Assay (FDA     approved for NASAL specimens     only), is one component of a     comprehensive MRSA colonization     surveillance program. It is not     intended to diagnose MRSA     infection nor to guide or     monitor treatment for     MRSA infections.    Coagulation Studies:  Recent Labs  09/18/13 0100  LABPROT 17.0*  INR 1.42    Urinalysis:  Recent Labs  09/18/13 2013  COLORURINE YELLOW  LABSPEC 1.010  PHURINE 6.5  GLUCOSEU NEGATIVE  HGBUR SMALL*  BILIRUBINUR NEGATIVE  KETONESUR NEGATIVE  PROTEINUR 100*  UROBILINOGEN 0.2  NITRITE NEGATIVE  LEUKOCYTESUR NEGATIVE      Imaging: US Renal  09/18/2013   CLINICAL DATA:  Chronic kidney disease, stage III.  EXAM: RENAL/URINARY TRACT ULTRASOUND COMPLETE  COMPARISON:  Abdominal CT 09/17/2013  FINDINGS: Right Kidney:  Length: 11.6 cm. There are small round hypoechoic structures in the right kidney mid and upper pole region. One measures 1.3 cm and the other measures up to 1.2 cm. There is a hypoechoic exophytic structure in the right kidney lower pole that measures 2.8 x 2.0 x 2.7 cm. Structure is predominantly hypoechoic but there are internal echogenic areas and the structure does not clearly have acoustic enhancement. This lesion corresponds with the hyperdense structure on the previous CT. Negative for hydronephrosis.  Left Kidney:  Length: 12.3 cm. Echogenicity within normal limits. No mass or hydronephrosis visualized.  Bladder:  Small amount of fluid in the urinary bladder. The prostate is prominent measuring 4.5 x 3.6 x 3.7 cm.  IMPRESSION: Negative for  hydronephrosis.  There is a heterogeneous structure in the right kidney lower pole that may represent a complex cyst but indeterminate. Due to the chronic kidney disease, the patient is probably not a candidate for a pre and post-contrast MRI for more definitive evaluation. This lesion could be further evaluated with a non contrast MRI or follow-up with ultrasound.   Electronically Signed   By: Markus Daft M.D.   On: 09/18/2013 15:28   Dg Chest Port 1 View  09/18/2013   CLINICAL DATA:  Status post temporary dialysis catheter placement  EXAM: PORTABLE CHEST - 1 VIEW  COMPARISON:  09/17/2013  FINDINGS: Cardiac shadow is stable. A new right jugular dialysis catheter is noted with the tip at the cavoatrial junction. Mild vascular congestion is seen likely related to volume overload. No bony abnormality is noted. No pneumothorax is seen.  IMPRESSION: No pneumothorax following dialysis catheter placement.  Vascular congestion consistent with volume overload.   Electronically Signed   By: Inez Catalina M.D.   On: 09/18/2013 09:01     Medications:   . sodium chloride 75 mL/hr at 09/18/13 0600   . amLODipine  10 mg Oral Daily  . darbepoetin (ARANESP) injection - DIALYSIS  150 mcg Intravenous Q Sat-HD  . feeding supplement (RESOURCE BREEZE)  1 Container Oral BID WC  . heparin  5,000 Units Subcutaneous Q8H  . hydrALAZINE  10 mg Oral Q8H  . isosorbide dinitrate  10 mg Oral TID  . labetalol  200 mg Oral BID  . pantoprazole  40 mg Oral Q1200   acetaminophen, acetaminophen, cloNIDine, feeding supplement (NEPRO CARB STEADY), hydrALAZINE, ondansetron (ZOFRAN) IV, ondansetron  Assessment/ Plan:  Assessment/Plan:  Newly recognized chronic renal disease stage V serologies negative so far HTN continue antihypertensives and challenge fluid add labetalol 200 BID Anemia chronic check Iron studies start aranesp transfuse 1-2 units with dialysis Bones check PTH calcium and phosphorus  Will need permcath and AVF next  week appreciate  Dr Bridgett Larsson Renal mass  I believe that urology will need to see patient as there are some worrying features of the Left Kidney. This can be set up as an outpatient    LOS: 2 Thomas Adams W @TODAY @2 :18 PM

## 2013-09-19 NOTE — Progress Notes (Signed)
[place] TEAM 1 - Stepdown/ICU TEAM Progress Note  Thomas Adams [id number] DOB: 1961-07-17 DOA: 09/17/2013 PCP: No PCP Per Patient  Admit HPI / Brief Narrative: 53 y.o. male with history of hypertension and chronic kidney disease stage III (baseline creatinine of 1.88 in 2012) who presented for elevated BP. Patient reported that 3 weeks prior he ran out of his lisinopril. 2 weeks prior he started feeling weak with nausea. He presented to [facility] to get a prescription for his blood pressure medications. In the ED he was found to be hypertensive at 250/143 and he was found to be in acute renal failure with a creatinine of 14.3. As [facility] did not have dialysis capabilities, he was transferred to [facility] for further management. He was also found to be anemic with a hemoglobin of 7.5.   HPI/Subjective: Having a hard time accepting that he now needs HD.  No physical complaints otherwise.  Does admit to some mild SOB.  Assessment/Plan:  Acute on chronic kidney failure  As per Nephrology - HD has been initiated   Malignant HTN Remains poorly controlled - HD will be greatest contributor to control - adjust meds for slow improvement as is a chronic issue   Anemia w/o evidence of blood loss Hgb improved w/ HD (volume removal) - epo and Fe as per Renal protocol   Obesity - Body mass index is 36.9 kg/(m^2).  Code Status: FULL Family Communication: no family present at time of exam  Disposition Plan: SDU - move to Renal unit in AM if BP improved   Consultants: Nephrology Vasc Surgery   Procedures: 2/07 - R IJ HD catheter insertion   Antibiotics: None  DVT prophylaxis: SQ heparin   Objective: Blood pressure 182/103, pulse 88, temperature 98.6 F (37 C), temperature source Oral, resp. rate 25, height 5\' 9"  (1.753 m), weight 113.4 kg (250 lb), SpO2 98.00%.  Intake/Output Summary (Last 24 hours) at 09/19/13 1412 Last data filed at 09/19/13 1000  Gross  per 24 hour  Intake    360 ml  Output   1075 ml  Net   -715 ml   Exam: General: No acute respiratory distress Lungs: mild diffuse crackles - no wheeze  Cardiovascular: Regular rate and rhythm without murmur gallop or rub normal S1 and S2 Abdomen: Nontender, nondistended, soft, bowel sounds positive, no rebound, no ascites, no appreciable mass Extremities: No significant cyanosis, clubbing; 1+ edema bilateral lower extremities  Data Reviewed: Basic Metabolic Panel:  Recent Labs Lab 09/18/13 0100 09/19/13 0328  NA 139  139 137  K 5.2  5.1 4.4  CL 103  103 100  CO2 17*  16* 19  GLUCOSE 115*  115* 103*  BUN 93*  93* 70*  CREATININE 13.75*  13.85* 10.43*  CALCIUM 8.9  8.8 8.3*  PHOS  --  4.6   Liver Function Tests:  Recent Labs Lab 09/18/13 0100 09/19/13 0328  AST 9  --   ALT 20  --   ALKPHOS 66  --   BILITOT 0.4  --   PROT 6.7  --   ALBUMIN 3.6 3.2*   No results found for this basename: LIPASE, AMYLASE,  in the last 168 hours No results found for this basename: AMMONIA,  in the last 168 hours  CBC:  Recent Labs Lab 09/18/13 0100 09/19/13 0328  WBC 7.0 7.7  NEUTROABS 5.6  --   HGB 6.7* 7.7*  HCT 20.5* 23.1*  MCV 83.0 83.1  PLT 190 169  Cardiac Enzymes:  Recent Labs Lab 09/18/13 0100 09/18/13 0703  TROPONINI <0.30 <0.30    Recent Results (from the past 240 hour(s))  MRSA PCR SCREENING     Status: None   Collection Time    09/18/13 12:34 AM      Result Value Range Status   MRSA by PCR NEGATIVE  NEGATIVE Final   Comment:            The GeneXpert MRSA Assay (FDA     approved for NASAL specimens     only), is one component of a     comprehensive MRSA colonization     surveillance program. It is not     intended to diagnose MRSA     infection nor to guide or     monitor treatment for     MRSA infections.     Studies:  Recent x-ray studies have been reviewed in detail by the Attending Physician  Scheduled Meds:  Scheduled  Meds: . amLODipine  10 mg Oral Daily  . darbepoetin (ARANESP) injection - DIALYSIS  150 mcg Intravenous Q Sat-HD  . feeding supplement (RESOURCE BREEZE)  1 Container Oral BID WC  . heparin  5,000 Units Subcutaneous Q8H  . pantoprazole  40 mg Oral Q1200    Time spent on care of this patient: 35 mins   [place]  [phone] Pager - Text Page per Shea Evans as per below:  On-Call/Text Page:      [email or web address]      password [email or web address]  If 7PM-7AM, please contact night-coverage [email or web address] Password [email or web address] 09/19/2013, 2:12 PM   LOS: 2 days

## 2013-09-20 LAB — RENAL FUNCTION PANEL
Albumin: 3 g/dL — ABNORMAL LOW (ref 3.5–5.2)
Albumin: 3.3 g/dL — ABNORMAL LOW (ref 3.5–5.2)
BUN: 74 mg/dL — ABNORMAL HIGH (ref 6–23)
BUN: 71 mg/dL — AB (ref 6–23)
CHLORIDE: 100 meq/L (ref 96–112)
CO2: 18 mEq/L — ABNORMAL LOW (ref 19–32)
CO2: 19 meq/L (ref 19–32)
Calcium: 8.2 mg/dL — ABNORMAL LOW (ref 8.4–10.5)
Calcium: 8.5 mg/dL (ref 8.4–10.5)
Chloride: 102 mEq/L (ref 96–112)
Creatinine, Ser: 11.42 mg/dL — ABNORMAL HIGH (ref 0.50–1.35)
Creatinine, Ser: 11.57 mg/dL — ABNORMAL HIGH (ref 0.50–1.35)
GFR calc Af Amer: 5 mL/min — ABNORMAL LOW (ref >90)
GFR, EST AFRICAN AMERICAN: 5 mL/min — AB (ref >90)
GFR, EST NON AFRICAN AMERICAN: 5 mL/min — AB (ref >90)
GFR, EST NON AFRICAN AMERICAN: 4 mL/min — AB (ref >90)
GLUCOSE: 91 mg/dL (ref 70–99)
Glucose, Bld: 114 mg/dL — ABNORMAL HIGH (ref 70–99)
PHOSPHORUS: 4.6 mg/dL (ref 2.3–4.6)
POTASSIUM: 4.8 meq/L (ref 3.7–5.3)
Phosphorus: 4.5 mg/dL (ref 2.3–4.6)
Potassium: 4.6 mEq/L (ref 3.7–5.3)
Sodium: 137 mEq/L (ref 137–147)
Sodium: 136 mEq/L — ABNORMAL LOW (ref 137–147)

## 2013-09-20 LAB — CBC
HEMATOCRIT: 21.6 % — AB (ref 39.0–52.0)
Hemoglobin: 7.3 g/dL — ABNORMAL LOW (ref 13.0–17.0)
MCH: 28.4 pg (ref 26.0–34.0)
MCHC: 33.8 g/dL (ref 30.0–36.0)
MCV: 84 fL (ref 78.0–100.0)
PLATELETS: 176 10*3/uL (ref 150–400)
RBC: 2.57 MIL/uL — AB (ref 4.22–5.81)
RDW: 16.2 % — ABNORMAL HIGH (ref 11.5–15.5)
WBC: 8.6 10*3/uL (ref 4.0–10.5)

## 2013-09-20 LAB — PARATHYROID HORMONE, INTACT (NO CA): PTH: 1127.3 pg/mL — ABNORMAL HIGH (ref 14.0–72.0)

## 2013-09-20 MED ORDER — LIDOCAINE HCL (PF) 1 % IJ SOLN: 5.0000 mL | INTRAMUSCULAR | Status: DC | PRN

## 2013-09-20 MED ORDER — PENTAFLUOROPROP-TETRAFLUOROETH EX AERO: 1.0000 "application " | INHALATION_SPRAY | CUTANEOUS | Status: DC | PRN

## 2013-09-20 MED ORDER — ALTEPLASE 2 MG IJ SOLR: 2.0000 mg | Freq: Once | INTRAMUSCULAR | Status: DC | PRN

## 2013-09-20 MED ORDER — LIDOCAINE-PRILOCAINE 2.5-2.5 % EX CREA: 1.0000 "application " | TOPICAL_CREAM | CUTANEOUS | Status: DC | PRN

## 2013-09-20 MED ORDER — NEPRO/CARBSTEADY PO LIQD: 237.0000 mL | ORAL | Status: DC | PRN

## 2013-09-20 MED ORDER — DARBEPOETIN ALFA-POLYSORBATE 150 MCG/0.3ML IJ SOLN
150.0000 ug | INTRAMUSCULAR | Status: DC
  Administered 2013-09-22: 150 ug via INTRAVENOUS
  Filled 2013-09-20: qty 0.3

## 2013-09-20 MED ORDER — SODIUM CHLORIDE 0.9 % IV SOLN: 100.0000 mL | INTRAVENOUS | Status: DC | PRN

## 2013-09-20 MED ORDER — HEPARIN SODIUM (PORCINE) 1000 UNIT/ML DIALYSIS
1000.0000 [IU] | INTRAMUSCULAR | Status: DC | PRN
Start: 2013-09-20 — End: 2013-09-20

## 2013-09-20 NOTE — Progress Notes (Signed)
[place] TEAM 1 - Stepdown/ICU TEAM Progress Note  TALIS GREENLIEF [id number] DOB: October 20, 1960 DOA: 09/17/2013 PCP: No PCP Per Patient  Admit HPI / Brief Narrative: 53 y.o. male with history of hypertension and chronic kidney disease stage III (baseline creatinine of 1.88 in 2012) who presented for elevated BP. Patient reported that 3 weeks prior he ran out of his lisinopril. 2 weeks prior he started feeling weak with nausea. He presented to [facility] to get a prescription for his blood pressure medications. In the ED he was found to be hypertensive at 250/143 and he was found to be in acute renal failure with a creatinine of 14.3. As [facility] did not have dialysis capabilities, he was transferred to [facility] for further management. He was also found to be anemic with a hemoglobin of 7.5.   HPI/Subjective: Still a bit down about needing HD.  No new physical complaints.  Denies sob, cp, n/v, or abdom pain.    Assessment/Plan:  Acute on chronic kidney failure  As per Nephrology - HD has been initiated - awaiting long term HD cath and perm vasc access per [facility]   Malignant HTN Much improved, and not too rapidly corrected - HD will likely be greatest contributor to control, and meds may need to be decreased after more tx are completed/pt approaches dry weight   R kidney mass Will need further eval, as noted by Nephrology - pt will need outpt Urology referral, perhaps arranged per [facility]?  Anemia w/o evidence of blood loss Hgb improved w/ HD (volume removal) + 1U PRBC 2/6  - epo and Fe as per Renal protocol   Obesity - Body mass index is 36.15 kg/(m^2).  Code Status: FULL Family Communication: no family present at time of exam  Disposition Plan: move to Renal unit   Consultants: Nephrology Vasc Surgery   Procedures: 2/07 - R IJ HD catheter insertion   Antibiotics: None  DVT prophylaxis: SQ heparin   Objective: Blood pressure 152/91, pulse 89,  temperature 98.4 F (36.9 C), temperature source Oral, resp. rate 23, height 5\' 9"  (1.753 m), weight 111.1 kg (244 lb 14.9 oz), SpO2 100.00%.  Intake/Output Summary (Last 24 hours) at 09/20/13 1251 Last data filed at 09/20/13 1233  Gross per 24 hour  Intake    360 ml  Output   2752 ml  Net  -2392 ml   Exam: General: No acute respiratory distress Lungs: mild diffuse crackles but less pronounced - no wheeze  Cardiovascular: Regular rate and rhythm without murmur gallop or rub normal S1 and S2 Abdomen: Nontender, nondistended, soft, bowel sounds positive, no rebound, no ascites, no appreciable mass Extremities: No significant cyanosis, clubbing; trace edema bilateral lower extremities  Data Reviewed: Basic Metabolic Panel:  Recent Labs Lab 09/18/13 0100 09/19/13 0328 09/20/13 0310 09/20/13 0710  NA 139  139 137 137 136*  K 5.2  5.1 4.4 4.8 4.6  CL 103  103 100 102 100  CO2 17*  16* 19 18* 19  GLUCOSE 115*  115* 103* 91 114*  BUN 93*  93* 70* 74* 71*  CREATININE 13.75*  13.85* 10.43* 11.42* 11.57*  CALCIUM 8.9  8.8 8.3* 8.2* 8.5  PHOS  --  4.6 4.6 4.5   Liver Function Tests:  Recent Labs Lab 09/18/13 0100 09/19/13 0328 09/20/13 0310 09/20/13 0710  AST 9  --   --   --   ALT 20  --   --   --   Arabella Merles  66  --   --   --   BILITOT 0.4  --   --   --   PROT 6.7  --   --   --   ALBUMIN 3.6 3.2* 3.0* 3.3*   No results found for this basename: LIPASE, AMYLASE,  in the last 168 hours No results found for this basename: AMMONIA,  in the last 168 hours  CBC:  Recent Labs Lab 09/18/13 0100 09/19/13 0328 09/20/13 0710  WBC 7.0 7.7 8.6  NEUTROABS 5.6  --   --   HGB 6.7* 7.7* 7.3*  HCT 20.5* 23.1* 21.6*  MCV 83.0 83.1 84.0  PLT 190 169 176   Cardiac Enzymes:  Recent Labs Lab 09/18/13 0100 09/18/13 0703  TROPONINI <0.30 <0.30    Recent Results (from the past 240 hour(s))  MRSA PCR SCREENING     Status: None   Collection Time    09/18/13 12:34 AM       Result Value Range Status   MRSA by PCR NEGATIVE  NEGATIVE Final   Comment:            The GeneXpert MRSA Assay (FDA     approved for NASAL specimens     only), is one component of a     comprehensive MRSA colonization     surveillance program. It is not     intended to diagnose MRSA     infection nor to guide or     monitor treatment for     MRSA infections.     Studies:  Recent x-ray studies have been reviewed in detail by the Attending Physician  Scheduled Meds:  Scheduled Meds: . amLODipine  10 mg Oral Daily  . darbepoetin (ARANESP) injection - DIALYSIS  150 mcg Intravenous Q Sat-HD  . feeding supplement (RESOURCE BREEZE)  1 Container Oral BID WC  . heparin  5,000 Units Subcutaneous Q8H  . hydrALAZINE  10 mg Oral Q8H  . isosorbide dinitrate  10 mg Oral TID  . labetalol  200 mg Oral BID  . pantoprazole  40 mg Oral Q1200    Time spent on care of this patient: 25 mins   [place]  [phone] Pager - Text Page per Shea Evans as per below:  On-Call/Text Page:      [email or web address]      password [email or web address]  If 7PM-7AM, please contact night-coverage [email or web address] Password [email or web address] 09/20/2013, 12:51 PM   LOS: 3 days

## 2013-09-20 NOTE — Progress Notes (Signed)
Utilization review completed.  

## 2013-09-20 NOTE — Progress Notes (Signed)
   Awaiting vein mapping.  Will plan on access + TDC once available.  Adele Barthel, MD [facility] Office: [phone] Pager: [phone]  09/20/2013, 11:34 AM

## 2013-09-20 NOTE — Progress Notes (Signed)
[facility] ROUNDING NOTE   Subjective:   Interval History: no new complaints  S/p HD this AM-tolerated well.  CLIP in process   Objective:  Vital signs in last 24 hours:  Temp:  [98.2 F (36.8 C)-98.6 F (37 C)] 98.4 F (36.9 C) (02/09 0958) Pulse Rate:  [81-102] 89 (02/09 0940) Resp:  [17-29] 23 (02/09 0940) BP: (118-182)/(69-103) 152/91 mmHg (02/09 0940) SpO2:  [98 %-100 %] 100 % (02/09 0940) Weight:  [111.1 kg (244 lb 14.9 oz)-113.5 kg (250 lb 3.6 oz)] 111.1 kg (244 lb 14.9 oz) (02/09 0940)  Weight change: 0.1 kg (3.5 oz) Filed Weights   09/20/13 0419 09/20/13 0656 09/20/13 0940  Weight: 113.5 kg (250 lb 3.6 oz) 113.2 kg (249 lb 9 oz) 111.1 kg (244 lb 14.9 oz)    Intake/Output: I/O last 3 completed shifts: In: 600 [P.O.:600] Out: 1525 [Urine:1525]   Intake/Output this shift:  Total I/O In: -  Out: 2002 [Other:2002]  CVS- RRR RS- CTA Right IJ ABD- BS present soft non-distended EXT- no edema   Basic Metabolic Panel:  Recent Labs Lab 09/18/13 0100 09/19/13 0328 09/20/13 0310 09/20/13 0710  NA 139  139 137 137 136*  K 5.2  5.1 4.4 4.8 4.6  CL 103  103 100 102 100  CO2 17*  16* 19 18* 19  GLUCOSE 115*  115* 103* 91 114*  BUN 93*  93* 70* 74* 71*  CREATININE 13.75*  13.85* 10.43* 11.42* 11.57*  CALCIUM 8.9  8.8 8.3* 8.2* 8.5  PHOS  --  4.6 4.6 4.5    Liver Function Tests:  Recent Labs Lab 09/18/13 0100 09/19/13 0328 09/20/13 0310 09/20/13 0710  AST 9  --   --   --   ALT 20  --   --   --   ALKPHOS 66  --   --   --   BILITOT 0.4  --   --   --   PROT 6.7  --   --   --   ALBUMIN 3.6 3.2* 3.0* 3.3*   No results found for this basename: LIPASE, AMYLASE,  in the last 168 hours No results found for this basename: AMMONIA,  in the last 168 hours  CBC:  Recent Labs Lab 09/18/13 0100 09/19/13 0328 09/20/13 0710  WBC 7.0 7.7 8.6  NEUTROABS 5.6  --   --   HGB 6.7* 7.7* 7.3*  HCT 20.5* 23.1* 21.6*  MCV 83.0 83.1 84.0   PLT 190 169 176    Cardiac Enzymes:  Recent Labs Lab 09/18/13 0100 09/18/13 0703  TROPONINI <0.30 <0.30    BNP: No components found with this basename: POCBNP,   CBG: No results found for this basename: GLUCAP,  in the last 168 hours  Microbiology: Results for orders placed during the hospital encounter of 09/17/13  MRSA PCR SCREENING     Status: None   Collection Time    09/18/13 12:34 AM      Result Value Range Status   MRSA by PCR NEGATIVE  NEGATIVE Final   Comment:            The GeneXpert MRSA Assay (FDA     approved for NASAL specimens     only), is one component of a     comprehensive MRSA colonization     surveillance program. It is not     intended to diagnose MRSA     infection nor to guide or     monitor treatment  for     MRSA infections.    Coagulation Studies:  Recent Labs  09/18/13 0100  LABPROT 17.0*  INR 1.42    Urinalysis:  Recent Labs  09/18/13 2013  COLORURINE YELLOW  LABSPEC 1.010  PHURINE 6.5  GLUCOSEU NEGATIVE  HGBUR SMALL*  BILIRUBINUR NEGATIVE  KETONESUR NEGATIVE  PROTEINUR 100*  UROBILINOGEN 0.2  NITRITE NEGATIVE  LEUKOCYTESUR NEGATIVE      Imaging: US Renal  09/18/2013   CLINICAL DATA:  Chronic kidney disease, stage III.  EXAM: RENAL/URINARY TRACT ULTRASOUND COMPLETE  COMPARISON:  Abdominal CT 09/17/2013  FINDINGS: Right Kidney:  Length: 11.6 cm. There are small round hypoechoic structures in the right kidney mid and upper pole region. One measures 1.3 cm and the other measures up to 1.2 cm. There is a hypoechoic exophytic structure in the right kidney lower pole that measures 2.8 x 2.0 x 2.7 cm. Structure is predominantly hypoechoic but there are internal echogenic areas and the structure does not clearly have acoustic enhancement. This lesion corresponds with the hyperdense structure on the previous CT. Negative for hydronephrosis.  Left Kidney:  Length: 12.3 cm. Echogenicity within normal limits. No mass or  hydronephrosis visualized.  Bladder:  Small amount of fluid in the urinary bladder. The prostate is prominent measuring 4.5 x 3.6 x 3.7 cm.  IMPRESSION: Negative for hydronephrosis.  There is a heterogeneous structure in the right kidney lower pole that may represent a complex cyst but indeterminate. Due to the chronic kidney disease, the patient is probably not a candidate for a pre and post-contrast MRI for more definitive evaluation. This lesion could be further evaluated with a non contrast MRI or follow-up with ultrasound.   Electronically Signed   By: Markus Daft M.D.   On: 09/18/2013 15:28     Medications:     . amLODipine  10 mg Oral Daily  . darbepoetin (ARANESP) injection - DIALYSIS  150 mcg Intravenous Q Sat-HD  . feeding supplement (RESOURCE BREEZE)  1 Container Oral BID WC  . heparin  5,000 Units Subcutaneous Q8H  . hydrALAZINE  10 mg Oral Q8H  . isosorbide dinitrate  10 mg Oral TID  . labetalol  200 mg Oral BID  . pantoprazole  40 mg Oral Q1200   acetaminophen, acetaminophen, cloNIDine, feeding supplement (NEPRO CARB STEADY), hydrALAZINE, ondansetron (ZOFRAN) IV, ondansetron  Assessment/ Plan:  Assessment/Plan:  Newly recognized chronic renal disease stage V serologies negative so far- have initiated HD- s/p HD this AM via temp HD cath- plan is for [facility] and perm access per [facility] tomorrow or Wednesday and will coordinate next HD with that but likely will try to do on Wednesday.   Awaiting OP dialysis arrangements.   HTN continue antihypertensives and challenge fluid add labetalol 200 BID- not terrible control at present on amlodipine/hydralazine and labetalol.  May be able to wean meds as HD gets more established.  Anemia  Iron studies low, will replete and also giving aranesp transfused previously, now watching closely Bones check PTH calcium and phosphorus - PTH still pending- phos good on no binder at present Will need permcath and AVF next week appreciate  Dr Bridgett Larsson Renal mass  I  believe that urology will need to see patient as there are some worrying features of the Left Kidney. This can be set up as an outpatient    LOS: 3 Nancylee Gaines A @TODAY @10 :15 AM

## 2013-09-21 DIAGNOSIS — N19 Unspecified kidney failure: Secondary | ICD-10-CM

## 2013-09-21 LAB — PROTEIN ELECTROPHORESIS, SERUM
ALPHA-2-GLOBULIN: 8.8 % (ref 7.1–11.8)
Albumin ELP: 60.5 % (ref 55.8–66.1)
Alpha-1-Globulin: 4.8 % (ref 2.9–4.9)
BETA GLOBULIN: 4.8 % (ref 4.7–7.2)
Beta 2: 4.4 % (ref 3.2–6.5)
GAMMA GLOBULIN: 16.7 % (ref 11.1–18.8)
M-Spike, %: NOT DETECTED g/dL
Total Protein ELP: 3.8 g/dL — ABNORMAL LOW (ref 6.0–8.3)

## 2013-09-21 LAB — RENAL FUNCTION PANEL
ALBUMIN: 3.3 g/dL — AB (ref 3.5–5.2)
BUN: 52 mg/dL — ABNORMAL HIGH (ref 6–23)
CHLORIDE: 101 meq/L (ref 96–112)
CO2: 21 mEq/L (ref 19–32)
Calcium: 8.7 mg/dL (ref 8.4–10.5)
Creatinine, Ser: 9.62 mg/dL — ABNORMAL HIGH (ref 0.50–1.35)
GFR calc non Af Amer: 6 mL/min — ABNORMAL LOW (ref >90)
GFR, EST AFRICAN AMERICAN: 6 mL/min — AB (ref >90)
Glucose, Bld: 87 mg/dL (ref 70–99)
POTASSIUM: 4.7 meq/L (ref 3.7–5.3)
Phosphorus: 4.6 mg/dL (ref 2.3–4.6)
Sodium: 137 mEq/L (ref 137–147)

## 2013-09-21 MED ORDER — KIDNEY FAILURE BOOK
Freq: Once | Status: AC
  Administered 2013-09-21: 04:00:00
  Filled 2013-09-21: qty 1

## 2013-09-21 MED ORDER — DOXERCALCIFEROL 4 MCG/2ML IV SOLN
4.0000 ug | INTRAVENOUS | Status: DC
  Administered 2013-09-22: 4 ug via INTRAVENOUS
  Filled 2013-09-21: qty 2

## 2013-09-21 MED ORDER — FAMOTIDINE 20 MG PO TABS
20.0000 mg | ORAL_TABLET | Freq: Two times a day (BID) | ORAL | Status: DC | PRN
Start: 2013-09-21 — End: 2013-09-23
  Filled 2013-09-21: qty 1

## 2013-09-21 MED ORDER — NA FERRIC GLUC CPLX IN SUCROSE 12.5 MG/ML IV SOLN
125.0000 mg | Freq: Every day | INTRAVENOUS | Status: AC
  Administered 2013-09-21 – 2013-09-22 (×2): 125 mg via INTRAVENOUS
  Filled 2013-09-21 (×3): qty 10

## 2013-09-21 NOTE — Progress Notes (Signed)
VASCULAR LAB PRELIMINARY  PRELIMINARY  PRELIMINARY  PRELIMINARY  Right  Upper Extremity Vein Map    Cephalic  Segment Diameter Depth Comment  1. Near shoulder 4.5 mm mm   2. Proximal upper arm 4.3 mm mm   3. Mid upper arm 4.1 mm mm   4. Above AC 6.0 mm mm   5. In AC mm mm IV  6. Below AC mm mm IV  7. Mid forearm 3.3 mm mm   8. Above wrist 3.5 mm mm    mm mm    mm mm    Basilic  Segment Diameter Depth Comment  1. Mid upper arm 4.0 mm 19.2 mm   2. Above AC 2.5 mm 14.1 mm   3. At [facility] mm mm IV   mm mm    mm mm    mm mm    mm mm    mm mm    mm mm    mm mm    Left Upper Extremity Vein Map    Cephalic  Segment Diameter Depth Comment  1. Above shoulder 4.2 mm mm   2. Proximal upper arm 4.0 mm mm tortuous  3. Mid upper arm 4.4 mm mm branch  4. Above AC 3.5 mm mm   5. In AC 4.3 mm mm branch  6. Below AC 3.2 mm mm   7. Mid forearm 3.3 mm mm   8. Above wrist 3.2 mm mm    mm mm    mm mm    Basilic  Segment Diameter Depth Comment  1. Above AC 3.1 mm 8.9 mm   2. At [facility] 2.7 mm 3.2 mm   3. Below AC 2.1 mm 2.8 mm    mm mm    mm mm    mm mm    mm mm    mm mm    mm mm    mm mm     Lorna Strother, RVT 09/21/2013, 10:28 AM

## 2013-09-21 NOTE — Progress Notes (Signed)
NUTRITION FOLLOW-UP  DOCUMENTATION CODES Per approved criteria  -Obesity Unspecified   INTERVENTION: Continue Resource Breeze BID. RD to continue to monitor.  NUTRITION DIAGNOSIS: Predicted suboptimal energy intake related to medical condition as evidenced by new CKD stage 5, nausea, and taste changes per chart. Ongoing.  Goal: Pt to meet >/= 90% of their estimated nutrition needs   Monitor:  PO intake, weight trends, labs, plan of care  ASSESSMENT: 53 year old gentleman history of HTN many years with poor compliance and was injured at work in October. 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 He was out of BP meds for 2 weeks and states that since not taking he has had nausea and vomiting. He presented to the ER and labs demonstrated a creatinine of 14 and Hb of 7. He has complained for months of metalic taste in his mouth.  CLIP process initiated 2/7. HD has been initiated; pt is currently awaiting long-term HD cath and permanent vascular access per [facility].  Pt reports that his usual body weight is 225 lb, however current weight is 249 lb. Pt says he has lost some weight, however discussed current weight trends. Pt reports that his appetite is slowly improving and is drinking his [employer]. Denies any questions/concerns. Agreeable to education closer to d/c. Appears well-nourished with no significant fat/muscle mass loss.  Height: Ht Readings from Last 1 Encounters:  09/18/13 5\' 9"  (1.753 m)    Weight: Wt Readings from Last 1 Encounters:  09/20/13 249 lb 6.4 oz (113.127 kg)  Admit wt 244 lb  BMI:  Body mass index is 36.81 kg/(m^2). (Obesity)  Estimated Nutritional Needs: Kcal: 2100-2300 Protein: 115 - 125 grams Fluid: 1.2 L/day  Skin: non-pitting RLE and LLE edema, intact  Diet Order: Renal  EDUCATION NEEDS: -Education not appropriate at this time   Intake/Output Summary (Last 24  hours) at 09/21/13 0951 Last data filed at 09/20/13 1900  Gross per 24 hour  Intake    360 ml  Output    300 ml  Net     60 ml    Last BM: 2/8  Labs:   Recent Labs Lab 09/20/13 0310 09/20/13 0710 09/21/13 0515  NA 137 136* 137  K 4.8 4.6 4.7  CL 102 100 101  CO2 18* 19 21  BUN 74* 71* 52*  CREATININE 11.42* 11.57* 9.62*  CALCIUM 8.2* 8.5 8.7  PHOS 4.6 4.5 4.6  GLUCOSE 91 114* 87    CBG (last 3)  No results found for this basename: GLUCAP,  in the last 72 hours  Scheduled Meds: . amLODipine  10 mg Oral Daily  . [START ON 09/22/2013] darbepoetin (ARANESP) injection - DIALYSIS  150 mcg Intravenous Q Wed-HD  . feeding supplement (RESOURCE BREEZE)  1 Container Oral BID WC  . heparin  5,000 Units Subcutaneous Q8H  . hydrALAZINE  10 mg Oral Q8H  . isosorbide dinitrate  10 mg Oral TID  . labetalol  200 mg Oral BID  . pantoprazole  40 mg Oral Q1200    Continuous Infusions:    Inda Coke MS, RD, LDN Pager: [phone] After-hours pager: [phone]

## 2013-09-21 NOTE — Progress Notes (Signed)
PROGRESS NOTE   Thomas Adams [id number] DOB: 1960/11/29 DOA: 09/17/2013 PCP: No PCP Per Patient  Brief narrative: Thomas Adams is an 53 y.o. male with a PMH of hypertension and chronic kidney disease stage III (baseline creatinine of 1.88 in 2012) who presented for elevated BP. Patient reported that 3 weeks prior he ran out of his lisinopril. 2 weeks prior he started feeling weak with nausea. He presented to [facility] to get a prescription for his blood pressure medications. In the ED he was found to be hypertensive at 250/143 and he was found to be in acute renal failure with a creatinine of 14.3. As [facility] did not have dialysis capabilities, he was transferred to [facility] for further management. He was also found to be anemic with a hemoglobin of 7.5.   Assessment/Plan: Principal Problem:   Acute on chronic renal failure Continue hemodialysis per nephrology. Serologies including SPEP, UPEP, HIV and hepatitis studies negative. Renal ultrasound done 09/18/13, negative for hydronephrosis. Continue renal diet. Active Problems:   Obesity Body mass index is 36.15 kg/(m^2). Dietician consulted for diet counseling.   Right kidney mass Further outpatient evaluation needed, but not a good candidate for MRI with contrast.   Secondary hyperparathyroidism Phosphorus levels within normal limits.   Malignant hypertension Continue labetalol, amlodipine, isordil, hydralazine and as needed clonidine.   Anemia of chronic disease Continue Aranesp and iron.  Status post 1 unit of packed red blood cells on 09/17/13.   DVT Prophylaxis Continue heparin every 8 hours.  Code Status: Full. Family Communication: No family at the bedside. Disposition Plan: Home when stable.   IV access:  2/07 - R IJ HD catheter insertion    Medical Consultants:  Dr. Adele Barthel, Vascular Surgery  Dr. Edrick Oh, Nephrology  Anti-infectives:  None  HPI/Subjective: Thomas Adams  denies pain, dyspnea, constipation, nausea/vomiting.  Objective: Filed Vitals:   09/20/13 1501 09/20/13 1615 09/20/13 1746 09/20/13 2114  BP:  140/65 142/93 143/97  Pulse:  92 104 98  Temp: 98.3 F (36.8 C) 98.3 F (36.8 C) 98.8 F (37.1 C) 99.1 F (37.3 C)  TempSrc: Oral Oral Oral Oral  Resp:  19 20 18   Height:      Weight:    113.127 kg (249 lb 6.4 oz)  SpO2:  100% 100% 100%    Intake/Output Summary (Last 24 hours) at 09/21/13 0853 Last data filed at 09/20/13 1900  Gross per 24 hour  Intake    360 ml  Output   2302 ml  Net  -1942 ml    Exam: Gen:  NAD Cardiovascular:  Mildly tachycardic, No M/R/G Respiratory:  Lungs slightly course Gastrointestinal:  Abdomen soft, NT/ND, + BS Extremities:  No C/E/C  Data Reviewed: Basic Metabolic Panel:  Recent Labs Lab 09/18/13 0100 09/19/13 0328 09/20/13 0310 09/20/13 0710 09/21/13 0515  NA 139  139 137 137 136* 137  K 5.2  5.1 4.4 4.8 4.6 4.7  CL 103  103 100 102 100 101  CO2 17*  16* 19 18* 19 21  GLUCOSE 115*  115* 103* 91 114* 87  BUN 93*  93* 70* 74* 71* 52*  CREATININE 13.75*  13.85* 10.43* 11.42* 11.57* 9.62*  CALCIUM 8.9  8.8 8.3* 8.2* 8.5 8.7  PHOS  --  4.6 4.6 4.5 4.6   GFR Estimated Creatinine Clearance: 11.4 ml/min (by C-G formula based on Cr of 9.62). Liver Function Tests:  Recent Labs Lab 09/18/13 0100 09/19/13 0328 09/20/13  0310 09/20/13 0710 09/21/13 0515  AST 9  --   --   --   --   ALT 20  --   --   --   --   ALKPHOS 66  --   --   --   --   BILITOT 0.4  --   --   --   --   PROT 6.7  --   --   --   --   ALBUMIN 3.6 3.2* 3.0* 3.3* 3.3*   Coagulation profile  Recent Labs Lab 09/18/13 0100  INR 1.42    CBC:  Recent Labs Lab 09/18/13 0100 09/19/13 0328 09/20/13 0710  WBC 7.0 7.7 8.6  NEUTROABS 5.6  --   --   HGB 6.7* 7.7* 7.3*  HCT 20.5* 23.1* 21.6*  MCV 83.0 83.1 84.0  PLT 190 169 176   Cardiac Enzymes:  Recent Labs Lab 09/18/13 0100 09/18/13 0703  TROPONINI  <0.30 <0.30   Microbiology Recent Results (from the past 240 hour(s))  MRSA PCR SCREENING     Status: None   Collection Time    09/18/13 12:34 AM      Result Value Range Status   MRSA by PCR NEGATIVE  NEGATIVE Final   Comment:            The GeneXpert MRSA Assay (FDA     approved for NASAL specimens     only), is one component of a     comprehensive MRSA colonization     surveillance program. It is not     intended to diagnose MRSA     infection nor to guide or     monitor treatment for     MRSA infections.     Procedures and Diagnostic Studies: US Renal  09/18/2013   CLINICAL DATA:  Chronic kidney disease, stage III.  EXAM: RENAL/URINARY TRACT ULTRASOUND COMPLETE  COMPARISON:  Abdominal CT 09/17/2013  FINDINGS: Right Kidney:  Length: 11.6 cm. There are small round hypoechoic structures in the right kidney mid and upper pole region. One measures 1.3 cm and the other measures up to 1.2 cm. There is a hypoechoic exophytic structure in the right kidney lower pole that measures 2.8 x 2.0 x 2.7 cm. Structure is predominantly hypoechoic but there are internal echogenic areas and the structure does not clearly have acoustic enhancement. This lesion corresponds with the hyperdense structure on the previous CT. Negative for hydronephrosis.  Left Kidney:  Length: 12.3 cm. Echogenicity within normal limits. No mass or hydronephrosis visualized.  Bladder:  Small amount of fluid in the urinary bladder. The prostate is prominent measuring 4.5 x 3.6 x 3.7 cm.  IMPRESSION: Negative for hydronephrosis.  There is a heterogeneous structure in the right kidney lower pole that may represent a complex cyst but indeterminate. Due to the chronic kidney disease, the patient is probably not a candidate for a pre and post-contrast MRI for more definitive evaluation. This lesion could be further evaluated with a non contrast MRI or follow-up with ultrasound.   Electronically Signed   By: Markus Daft M.D.   On: 09/18/2013  15:28   Dg Chest Port 1 View  09/18/2013   CLINICAL DATA:  Status post temporary dialysis catheter placement  EXAM: PORTABLE CHEST - 1 VIEW  COMPARISON:  09/17/2013  FINDINGS: Cardiac shadow is stable. A new right jugular dialysis catheter is noted with the tip at the cavoatrial junction. Mild vascular congestion is seen likely related to volume overload. No bony  abnormality is noted. No pneumothorax is seen.  IMPRESSION: No pneumothorax following dialysis catheter placement.  Vascular congestion consistent with volume overload.   Electronically Signed   By: Inez Catalina M.D.   On: 09/18/2013 09:01    Scheduled Meds: . amLODipine  10 mg Oral Daily  . [START ON 09/22/2013] darbepoetin (ARANESP) injection - DIALYSIS  150 mcg Intravenous Q Wed-HD  . feeding supplement (RESOURCE BREEZE)  1 Container Oral BID WC  . heparin  5,000 Units Subcutaneous Q8H  . hydrALAZINE  10 mg Oral Q8H  . isosorbide dinitrate  10 mg Oral TID  . labetalol  200 mg Oral BID  . pantoprazole  40 mg Oral Q1200   Continuous Infusions:   Time spent: 25 minutes.   LOS: 4 days   [facility] Pager [phone]. If unable to reach me by pager, please call my cell phone at [phone].  *Please note that the hospitalists switch teams on Wednesdays. Please call the flow manager at [phone] if you are having difficulty reaching the hospitalist taking care of this patient as she can update you and provide the most up-to-date pager number of provider caring for the patient. If 8PM-8AM, please contact night-coverage at [email or web address], password [facility]  09/21/2013, 8:53 AM    **Disclaimer: This note was dictated with voice recognition software. Similar sounding words can inadvertently be transcribed and this note may contain transcription errors which may not have been corrected upon publication of note.**

## 2013-09-21 NOTE — Progress Notes (Signed)
09/21/2013 12:54 PM Hemodialysis Outpatient Note; financial counselor Sharyn Lull has been assigned to Mr Scheid.  We are attempting to place this patient at the [facility]. I am awaiting acceptance from regional VP and the medical director of the [place] [facility]. I will follow up when I am advised of an appointment time for the the medicaid application. Thank you.Gordy Savers

## 2013-09-21 NOTE — Progress Notes (Signed)
   Daily Progress Note  Assessment/Planning: ESRD   TDC conversion and L RC vs BC AVF scheduled for tomorrow Risk, benefits, and alternatives to access surgery were discussed.  The patient is aware the risks include but are not limited to: bleeding, infection, steal syndrome, nerve damage, ischemic monomelic neuropathy, failure to mature, need for additional procedures, death and stroke.   The patient agrees to proceed forward with the procedure.  Subjective  - * No surgery found *  No complaints  Objective Filed Vitals:   09/20/13 1615 09/20/13 1746 09/20/13 2114 09/21/13 0857  BP: 140/65 142/93 143/97 157/81  Pulse: 92 104 98 78  Temp: 98.3 F (36.8 C) 98.8 F (37.1 C) 99.1 F (37.3 C) 98.1 F (36.7 C)  TempSrc: Oral Oral Oral Oral  Resp: 19 20 18 16   Height:      Weight:   249 lb 6.4 oz (113.127 kg)   SpO2: 100% 100% 100% 99%    Intake/Output Summary (Last 24 hours) at 09/21/13 1111 Last data filed at 09/20/13 1900  Gross per 24 hour  Intake    360 ml  Output    300 ml  Net     60 ml    PULM  CTAB CV  RRR VASC  L radial and brachial pulses palpable Vein mapping: 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

## 2013-09-21 NOTE — Progress Notes (Signed)
[facility] ROUNDING NOTE   Subjective:   Interval History: no new complaints   CLIP in process- no insurance will complicate.  VVS following for perm access and PC   Objective:  Vital signs in last 24 hours:  Temp:  [98.1 F (36.7 C)-99.1 F (37.3 C)] 98.1 F (36.7 C) (02/10 0857) Pulse Rate:  [78-104] 78 (02/10 0857) Resp:  [16-20] 16 (02/10 0857) BP: (133-157)/(65-97) 157/81 mmHg (02/10 0857) SpO2:  [99 %-100 %] 99 % (02/10 0857) Weight:  [113.127 kg (249 lb 6.4 oz)] 113.127 kg (249 lb 6.4 oz) (02/09 2114)  Weight change: -2.4 kg (-5 lb 4.7 oz) Filed Weights   09/20/13 0656 09/20/13 0940 09/20/13 2114  Weight: 113.2 kg (249 lb 9 oz) 111.1 kg (244 lb 14.9 oz) 113.127 kg (249 lb 6.4 oz)    Intake/Output: I/O last 3 completed shifts: In: 600 [P.O.:600] Out: 2752 [Urine:750; Other:2002]   Intake/Output this shift:     CVS- RRR RS- CTA Right IJ ABD- BS present soft non-distended EXT- no edema   Basic Metabolic Panel:  Recent Labs Lab 09/18/13 0100 09/19/13 0328 09/20/13 0310 09/20/13 0710 09/21/13 0515  NA 139  139 137 137 136* 137  K 5.2  5.1 4.4 4.8 4.6 4.7  CL 103  103 100 102 100 101  CO2 17*  16* 19 18* 19 21  GLUCOSE 115*  115* 103* 91 114* 87  BUN 93*  93* 70* 74* 71* 52*  CREATININE 13.75*  13.85* 10.43* 11.42* 11.57* 9.62*  CALCIUM 8.9  8.8 8.3* 8.2* 8.5 8.7  PHOS  --  4.6 4.6 4.5 4.6    Liver Function Tests:  Recent Labs Lab 09/18/13 0100 09/19/13 0328 09/20/13 0310 09/20/13 0710 09/21/13 0515  AST 9  --   --   --   --   ALT 20  --   --   --   --   ALKPHOS 66  --   --   --   --   BILITOT 0.4  --   --   --   --   PROT 6.7  --   --   --   --   ALBUMIN 3.6 3.2* 3.0* 3.3* 3.3*   No results found for this basename: LIPASE, AMYLASE,  in the last 168 hours No results found for this basename: AMMONIA,  in the last 168 hours  CBC:  Recent Labs Lab 09/18/13 0100 09/19/13 0328 09/20/13 0710  WBC 7.0 7.7 8.6   NEUTROABS 5.6  --   --   HGB 6.7* 7.7* 7.3*  HCT 20.5* 23.1* 21.6*  MCV 83.0 83.1 84.0  PLT 190 169 176    Cardiac Enzymes:  Recent Labs Lab 09/18/13 0100 09/18/13 0703  TROPONINI <0.30 <0.30    BNP: No components found with this basename: POCBNP,   CBG: No results found for this basename: GLUCAP,  in the last 168 hours  Microbiology: Results for orders placed during the hospital encounter of 09/17/13  MRSA PCR SCREENING     Status: None   Collection Time    09/18/13 12:34 AM      Result Value Range Status   MRSA by PCR NEGATIVE  NEGATIVE Final   Comment:            The GeneXpert MRSA Assay (FDA     approved for NASAL specimens     only), is one component of a     comprehensive MRSA colonization  surveillance program. It is not     intended to diagnose MRSA     infection nor to guide or     monitor treatment for     MRSA infections.    Coagulation Studies: No results found for this basename: LABPROT, INR,  in the last 72 hours  Urinalysis:  Recent Labs  09/18/13 2013  COLORURINE YELLOW  LABSPEC 1.010  PHURINE 6.5  GLUCOSEU NEGATIVE  HGBUR SMALL*  BILIRUBINUR NEGATIVE  KETONESUR NEGATIVE  PROTEINUR 100*  UROBILINOGEN 0.2  NITRITE NEGATIVE  LEUKOCYTESUR NEGATIVE      Imaging: No results found.   Medications:     . amLODipine  10 mg Oral Daily  . [START ON 09/22/2013] darbepoetin (ARANESP) injection - DIALYSIS  150 mcg Intravenous Q Wed-HD  . feeding supplement (RESOURCE BREEZE)  1 Container Oral BID WC  . heparin  5,000 Units Subcutaneous Q8H  . hydrALAZINE  10 mg Oral Q8H  . isosorbide dinitrate  10 mg Oral TID  . labetalol  200 mg Oral BID  . pantoprazole  40 mg Oral Q1200   acetaminophen, acetaminophen, cloNIDine, feeding supplement (NEPRO CARB STEADY), hydrALAZINE, ondansetron (ZOFRAN) IV, ondansetron  Assessment/ Plan:  Assessment/Plan:  Newly recognized chronic renal disease stage V serologies negative so far- have initiated  HD- s/p HD #2 on Monday via temp HD cath- plan is for PC and perm access per VVS soon and will coordinate next HD for now planning #3 for tomorrow (wed).   Awaiting OP dialysis arrangements, unfortunately pt not having insurance may complicate.   HTN continue antihypertensives and challenge fluid- not terrible control at present on amlodipine/hydralazine and labetalol.  May be able to wean meds as HD gets more established.  Anemia  Iron studies low, will replete and also giving aranesp transfused previously, now watching closely Bones  - PTH 1100 - phos good on no binder at present- will add vitamin D Will need permcath and AVF soon appreciate  Dr Thomas Adams Renal mass  I believe that urology will need to see patient as there are some worrying features of the Left Kidney. This can be set up as an outpatient    LOS: 4 Thomas Adams A @TODAY @10 :16 AM

## 2013-09-22 ENCOUNTER — Inpatient Hospital Stay (HOSPITAL_COMMUNITY): Payer: Medicaid Other | Admitting: Anesthesiology

## 2013-09-22 ENCOUNTER — Encounter (HOSPITAL_COMMUNITY): Payer: Self-pay | Admitting: Anesthesiology

## 2013-09-22 ENCOUNTER — Encounter (HOSPITAL_COMMUNITY)
Admission: AD | Disposition: A | Discharge status: Home / Self Care | Payer: Self-pay | Source: Other Acute Inpatient Hospital | Attending: Internal Medicine

## 2013-09-22 ENCOUNTER — Inpatient Hospital Stay (HOSPITAL_COMMUNITY): Payer: Medicaid Other

## 2013-09-22 DIAGNOSIS — N183 Chronic kidney disease, stage 3 unspecified: Secondary | ICD-10-CM

## 2013-09-22 HISTORY — PX: INSERTION OF DIALYSIS CATHETER: SHX1324

## 2013-09-22 HISTORY — PX: REMOVAL OF A DIALYSIS CATHETER: SHX6053

## 2013-09-22 HISTORY — PX: AV FISTULA PLACEMENT: SHX1204

## 2013-09-22 LAB — RENAL FUNCTION PANEL
ALBUMIN: 3.1 g/dL — AB (ref 3.5–5.2)
BUN: 57 mg/dL — ABNORMAL HIGH (ref 6–23)
CO2: 21 mEq/L (ref 19–32)
Calcium: 8.5 mg/dL (ref 8.4–10.5)
Chloride: 98 mEq/L (ref 96–112)
Creatinine, Ser: 11.01 mg/dL — ABNORMAL HIGH (ref 0.50–1.35)
GFR, EST AFRICAN AMERICAN: 5 mL/min — AB (ref >90)
GFR, EST NON AFRICAN AMERICAN: 5 mL/min — AB (ref >90)
GLUCOSE: 95 mg/dL (ref 70–99)
PHOSPHORUS: 5 mg/dL — AB (ref 2.3–4.6)
POTASSIUM: 4.5 meq/L (ref 3.7–5.3)
SODIUM: 135 meq/L — AB (ref 137–147)

## 2013-09-22 LAB — UIFE/LIGHT CHAINS/TP QN, 24-HR UR
Albumin, U: DETECTED
Alpha 1, Urine: DETECTED — AB
Alpha 2, Urine: DETECTED — AB
BETA UR: DETECTED — AB
Free Kappa Lt Chains,Ur: 34.4 mg/dL — ABNORMAL HIGH (ref 0.14–2.42)
Free Kappa/Lambda Ratio: 4.74 ratio (ref 2.04–10.37)
Free Lambda Lt Chains,Ur: 7.26 mg/dL — ABNORMAL HIGH (ref 0.02–0.67)
Gamma Globulin, Urine: DETECTED — AB
Total Protein, Urine: 57.9 mg/dL

## 2013-09-22 SURGERY — ARTERIOVENOUS (AV) FISTULA CREATION
Anesthesia: Monitor Anesthesia Care | Site: Neck | Laterality: Right

## 2013-09-22 MED ORDER — HEPARIN SODIUM (PORCINE) 1000 UNIT/ML DIALYSIS
1000.0000 [IU] | INTRAMUSCULAR | Status: DC | PRN
  Filled 2013-09-22: qty 1

## 2013-09-22 MED ORDER — SODIUM CHLORIDE 0.9 % IV SOLN
INTRAVENOUS | Status: DC | PRN
  Administered 2013-09-22 (×2): via INTRAVENOUS

## 2013-09-22 MED ORDER — LIDOCAINE HCL (PF) 1 % IJ SOLN: 5.0000 mL | INTRAMUSCULAR | Status: DC | PRN

## 2013-09-22 MED ORDER — DARBEPOETIN ALFA-POLYSORBATE 150 MCG/0.3ML IJ SOLN
INTRAMUSCULAR | Status: AC
  Administered 2013-09-22: 150 ug via INTRAVENOUS
  Filled 2013-09-22: qty 0.3

## 2013-09-22 MED ORDER — GLYCOPYRROLATE 0.2 MG/ML IJ SOLN
INTRAMUSCULAR | Status: DC | PRN
  Administered 2013-09-22: .2 mg via INTRAVENOUS

## 2013-09-22 MED ORDER — ONDANSETRON HCL 4 MG/2ML IJ SOLN
4.0000 mg | Freq: Once | INTRAMUSCULAR | Status: AC | PRN
  Administered 2013-09-22: 4 mg via INTRAVENOUS

## 2013-09-22 MED ORDER — PROMETHAZINE HCL 25 MG/ML IJ SOLN
INTRAMUSCULAR | Status: AC
  Filled 2013-09-22: qty 1

## 2013-09-22 MED ORDER — HEPARIN SODIUM (PORCINE) 1000 UNIT/ML IJ SOLN
INTRAMUSCULAR | Status: AC
  Filled 2013-09-22: qty 1

## 2013-09-22 MED ORDER — DOXERCALCIFEROL 4 MCG/2ML IV SOLN
INTRAVENOUS | Status: AC
  Administered 2013-09-22: 4 ug via INTRAVENOUS
  Filled 2013-09-22: qty 2

## 2013-09-22 MED ORDER — PROPOFOL 10 MG/ML IV BOLUS
INTRAVENOUS | Status: AC
  Filled 2013-09-22: qty 20

## 2013-09-22 MED ORDER — PENTAFLUOROPROP-TETRAFLUOROETH EX AERO: 1.0000 "application " | INHALATION_SPRAY | CUTANEOUS | Status: DC | PRN

## 2013-09-22 MED ORDER — LIDOCAINE HCL (CARDIAC) 20 MG/ML IV SOLN
INTRAVENOUS | Status: DC | PRN
  Administered 2013-09-22: 100 mg via INTRAVENOUS

## 2013-09-22 MED ORDER — ONDANSETRON HCL 4 MG/2ML IJ SOLN
INTRAMUSCULAR | Status: DC | PRN
  Administered 2013-09-22: 4 mg via INTRAVENOUS

## 2013-09-22 MED ORDER — HEPARIN SODIUM (PORCINE) 1000 UNIT/ML IJ SOLN
INTRAMUSCULAR | Status: DC | PRN
  Administered 2013-09-22: 10 mL

## 2013-09-22 MED ORDER — HYDROMORPHONE HCL PF 1 MG/ML IJ SOLN
0.2500 mg | INTRAMUSCULAR | Status: DC | PRN
  Administered 2013-09-22: 0.5 mg via INTRAVENOUS

## 2013-09-22 MED ORDER — LIDOCAINE HCL (PF) 1 % IJ SOLN
INTRAMUSCULAR | Status: AC
  Filled 2013-09-22: qty 30

## 2013-09-22 MED ORDER — SODIUM CHLORIDE 0.9 % IV SOLN: 100.0000 mL | INTRAVENOUS | Status: DC | PRN

## 2013-09-22 MED ORDER — THROMBIN 20000 UNITS EX SOLR
CUTANEOUS | Status: AC
  Filled 2013-09-22: qty 20000

## 2013-09-22 MED ORDER — LIDOCAINE-PRILOCAINE 2.5-2.5 % EX CREA: 1.0000 "application " | TOPICAL_CREAM | CUTANEOUS | Status: DC | PRN

## 2013-09-22 MED ORDER — SUCCINYLCHOLINE CHLORIDE 20 MG/ML IJ SOLN
INTRAMUSCULAR | Status: DC | PRN
  Administered 2013-09-22: 40 mg via INTRAVENOUS

## 2013-09-22 MED ORDER — VANCOMYCIN HCL IN DEXTROSE 750-5 MG/150ML-% IV SOLN
750.0000 mg | INTRAVENOUS | Status: AC
  Administered 2013-09-22: 750 mg via INTRAVENOUS
  Filled 2013-09-22: qty 150

## 2013-09-22 MED ORDER — LIDOCAINE HCL (CARDIAC) 20 MG/ML IV SOLN
INTRAVENOUS | Status: AC
  Filled 2013-09-22: qty 5

## 2013-09-22 MED ORDER — PROMETHAZINE HCL 25 MG/ML IJ SOLN
6.2500 mg | Freq: Once | INTRAMUSCULAR | Status: AC
  Administered 2013-09-22: 6.25 mg via INTRAVENOUS

## 2013-09-22 MED ORDER — FENTANYL CITRATE 0.05 MG/ML IJ SOLN
INTRAMUSCULAR | Status: DC | PRN
  Administered 2013-09-22: 100 ug via INTRAVENOUS
  Administered 2013-09-22: 50 ug via INTRAVENOUS

## 2013-09-22 MED ORDER — CALCIUM CARBONATE ANTACID 500 MG PO CHEW
400.0000 mg | CHEWABLE_TABLET | Freq: Three times a day (TID) | ORAL | Status: DC
  Administered 2013-09-22 – 2013-09-23 (×2): 400 mg via ORAL
  Filled 2013-09-22 (×5): qty 2

## 2013-09-22 MED ORDER — HYDROMORPHONE HCL PF 1 MG/ML IJ SOLN
INTRAMUSCULAR | Status: AC
  Filled 2013-09-22: qty 1

## 2013-09-22 MED ORDER — OXYCODONE-ACETAMINOPHEN 5-325 MG PO TABS
1.0000 | ORAL_TABLET | ORAL | Status: DC | PRN
  Administered 2013-09-23: 2 via ORAL
  Filled 2013-09-22: qty 2

## 2013-09-22 MED ORDER — ROCURONIUM BROMIDE 100 MG/10ML IV SOLN
INTRAVENOUS | Status: DC | PRN
  Administered 2013-09-22: 20 mg via INTRAVENOUS

## 2013-09-22 MED ORDER — 0.9 % SODIUM CHLORIDE (POUR BTL) OPTIME
TOPICAL | Status: DC | PRN
Start: 2013-09-22 — End: 2013-09-22
  Administered 2013-09-22: 1000 mL

## 2013-09-22 MED ORDER — ALTEPLASE 2 MG IJ SOLR
2.0000 mg | Freq: Once | INTRAMUSCULAR | Status: AC | PRN
  Filled 2013-09-22: qty 2

## 2013-09-22 MED ORDER — MIDAZOLAM HCL 2 MG/2ML IJ SOLN
INTRAMUSCULAR | Status: AC
  Filled 2013-09-22: qty 2

## 2013-09-22 MED ORDER — NEOSTIGMINE METHYLSULFATE 1 MG/ML IJ SOLN
INTRAMUSCULAR | Status: DC | PRN
  Administered 2013-09-22: 2 mg via INTRAVENOUS

## 2013-09-22 MED ORDER — NEPRO/CARBSTEADY PO LIQD: 237.0000 mL | ORAL | Status: DC | PRN

## 2013-09-22 MED ORDER — PROPOFOL 10 MG/ML IV BOLUS
INTRAVENOUS | Status: DC | PRN
  Administered 2013-09-22: 200 mg via INTRAVENOUS

## 2013-09-22 MED ORDER — SODIUM CHLORIDE 0.9 % IR SOLN
Status: DC | PRN
  Administered 2013-09-22: 10:00:00

## 2013-09-22 MED ORDER — MIDAZOLAM HCL 5 MG/5ML IJ SOLN
INTRAMUSCULAR | Status: DC | PRN
  Administered 2013-09-22: 1 mg via INTRAVENOUS

## 2013-09-22 MED ORDER — HEPARIN SODIUM (PORCINE) 1000 UNIT/ML DIALYSIS
20.0000 [IU]/kg | INTRAMUSCULAR | Status: DC | PRN
  Filled 2013-09-22: qty 3

## 2013-09-22 MED ORDER — ONDANSETRON HCL 4 MG/2ML IJ SOLN
INTRAMUSCULAR | Status: AC
  Filled 2013-09-22: qty 2

## 2013-09-22 MED ORDER — FENTANYL CITRATE 0.05 MG/ML IJ SOLN
INTRAMUSCULAR | Status: AC
  Filled 2013-09-22: qty 5

## 2013-09-22 SURGICAL SUPPLY — 77 items
ADH SKN CLS APL DERMABOND .7 (GAUZE/BANDAGES/DRESSINGS) ×2
ARMBAND PINK RESTRICT EXTREMIT (MISCELLANEOUS) ×3 IMPLANT
BAG BANDED W/RUBBER/TAPE 36X54 (MISCELLANEOUS) ×1 IMPLANT
BAG DECANTER FOR FLEXI CONT (MISCELLANEOUS) ×3 IMPLANT
BAG EQP BAND 135X91 W/RBR TAPE (MISCELLANEOUS) ×2
CANISTER SUCTION 2500CC (MISCELLANEOUS) ×3 IMPLANT
CATH CANNON HEMO 15F 50CM (CATHETERS) IMPLANT
CATH CANNON HEMO 15FR 19 (HEMODIALYSIS SUPPLIES) IMPLANT
CATH CANNON HEMO 15FR 23CM (HEMODIALYSIS SUPPLIES) ×1 IMPLANT
CATH CANNON HEMO 15FR 31CM (HEMODIALYSIS SUPPLIES) IMPLANT
CATH CANNON HEMO 15FR 32 (HEMODIALYSIS SUPPLIES) IMPLANT
CATH CANNON HEMO 15FR 32CM (HEMODIALYSIS SUPPLIES) IMPLANT
CATH STRAIGHT 5FR 65CM (CATHETERS) IMPLANT
CHLORAPREP W/TINT 26ML (MISCELLANEOUS) ×3 IMPLANT
CLIP TI MEDIUM 6 (CLIP) ×3 IMPLANT
CLIP TI WIDE RED SMALL 6 (CLIP) ×3 IMPLANT
COVER DOME SNAP 22 D (MISCELLANEOUS) ×1 IMPLANT
COVER PROBE W GEL 5X96 (DRAPES) ×3 IMPLANT
COVER SURGICAL LIGHT HANDLE (MISCELLANEOUS) ×3 IMPLANT
DECANTER SPIKE VIAL GLASS SM (MISCELLANEOUS) ×2 IMPLANT
DERMABOND ADVANCED (GAUZE/BANDAGES/DRESSINGS) ×1
DERMABOND ADVANCED .7 DNX12 (GAUZE/BANDAGES/DRESSINGS) ×2 IMPLANT
DRAIN PENROSE 1/4X12 LTX STRL (WOUND CARE) ×3 IMPLANT
DRAPE C-ARM 42X72 X-RAY (DRAPES) ×2 IMPLANT
DRAPE CHEST BREAST 15X10 FENES (DRAPES) ×3 IMPLANT
ELECT REM PT RETURN 9FT ADLT (ELECTROSURGICAL) ×3
ELECTRODE REM PT RTRN 9FT ADLT (ELECTROSURGICAL) ×2 IMPLANT
GAUZE SPONGE 2X2 8PLY STRL LF (GAUZE/BANDAGES/DRESSINGS) ×2 IMPLANT
GAUZE SPONGE 4X4 16PLY XRAY LF (GAUZE/BANDAGES/DRESSINGS) ×2 IMPLANT
GEL ULTRASOUND 20GR AQUASONIC (MISCELLANEOUS) IMPLANT
GLOVE BIO SURGEON STRL SZ7.5 (GLOVE) ×4 IMPLANT
GLOVE BIOGEL PI IND STRL 6.5 (GLOVE) IMPLANT
GLOVE BIOGEL PI IND STRL 7.0 (GLOVE) IMPLANT
GLOVE BIOGEL PI IND STRL 7.5 (GLOVE) IMPLANT
GLOVE BIOGEL PI IND STRL 8 (GLOVE) IMPLANT
GLOVE BIOGEL PI INDICATOR 6.5 (GLOVE) ×2
GLOVE BIOGEL PI INDICATOR 7.0 (GLOVE) ×1
GLOVE BIOGEL PI INDICATOR 7.5 (GLOVE) ×1
GLOVE BIOGEL PI INDICATOR 8 (GLOVE) ×1
GLOVE ECLIPSE 6.5 STRL STRAW (GLOVE) ×2 IMPLANT
GLOVE SS BIOGEL STRL SZ 7 (GLOVE) IMPLANT
GLOVE SUPERSENSE BIOGEL SZ 7 (GLOVE) ×1
GOWN STRL REUS W/ TWL LRG LVL3 (GOWN DISPOSABLE) ×6 IMPLANT
GOWN STRL REUS W/TWL LRG LVL3 (GOWN DISPOSABLE) ×15
KIT BASIN OR (CUSTOM PROCEDURE TRAY) ×3 IMPLANT
KIT ROOM TURNOVER OR (KITS) ×3 IMPLANT
LOOP VESSEL MINI RED (MISCELLANEOUS) IMPLANT
NDL 18GX1X1/2 (RX/OR ONLY) (NEEDLE) ×2 IMPLANT
NDL HYPO 25GX1X1/2 BEV (NEEDLE) ×2 IMPLANT
NEEDLE 18GX1X1/2 (RX/OR ONLY) (NEEDLE) ×3 IMPLANT
NEEDLE 22X1 1/2 (OR ONLY) (NEEDLE) ×3 IMPLANT
NEEDLE HYPO 25GX1X1/2 BEV (NEEDLE) ×3 IMPLANT
NS IRRIG 1000ML POUR BTL (IV SOLUTION) ×3 IMPLANT
PACK CV ACCESS (CUSTOM PROCEDURE TRAY) ×3 IMPLANT
PACK SURGICAL SETUP 50X90 (CUSTOM PROCEDURE TRAY) ×3 IMPLANT
PAD ARMBOARD 7.5X6 YLW CONV (MISCELLANEOUS) ×6 IMPLANT
SET MICROPUNCTURE 5F STIFF (MISCELLANEOUS) IMPLANT
SPONGE GAUZE 2X2 STER 10/PKG (GAUZE/BANDAGES/DRESSINGS) ×1
SPONGE GAUZE 4X4 12PLY (GAUZE/BANDAGES/DRESSINGS) ×1 IMPLANT
SPONGE SURGIFOAM ABS GEL 100 (HEMOSTASIS) IMPLANT
SUT ETHILON 3 0 PS 1 (SUTURE) ×4 IMPLANT
SUT PROLENE 6 0 BV (SUTURE) ×1 IMPLANT
SUT PROLENE 7 0 BV 1 (SUTURE) ×3 IMPLANT
SUT VIC AB 3-0 SH 27 (SUTURE) ×3
SUT VIC AB 3-0 SH 27X BRD (SUTURE) ×2 IMPLANT
SUT VICRYL 4-0 PS2 18IN ABS (SUTURE) ×3 IMPLANT
SYR 20CC LL (SYRINGE) ×6 IMPLANT
SYR 30ML LL (SYRINGE) IMPLANT
SYR 5ML LL (SYRINGE) ×6 IMPLANT
SYR CONTROL 10ML LL (SYRINGE) ×3 IMPLANT
SYRINGE 10CC LL (SYRINGE) ×3 IMPLANT
TAPE CLOTH SURG 4X10 WHT LF (GAUZE/BANDAGES/DRESSINGS) ×1 IMPLANT
TOWEL OR 17X24 6PK STRL BLUE (TOWEL DISPOSABLE) ×3 IMPLANT
TOWEL OR 17X26 10 PK STRL BLUE (TOWEL DISPOSABLE) ×3 IMPLANT
UNDERPAD 30X30 INCONTINENT (UNDERPADS AND DIAPERS) ×3 IMPLANT
WATER STERILE IRR 1000ML POUR (IV SOLUTION) ×3 IMPLANT
WIRE AMPLATZ SS-J .035X180CM (WIRE) IMPLANT

## 2013-09-22 NOTE — H&P (View-Only) (Signed)
   Daily Progress Note  Assessment/Planning: ESRD   TDC conversion and L RC vs BC AVF scheduled for tomorrow Risk, benefits, and alternatives to access surgery were discussed.  The patient is aware the risks include but are not limited to: bleeding, infection, steal syndrome, nerve damage, ischemic monomelic neuropathy, failure to mature, need for additional procedures, death and stroke.   The patient agrees to proceed forward with the procedure.  Subjective  - * No surgery found *  No complaints  Objective Filed Vitals:   09/20/13 1615 09/20/13 1746 09/20/13 2114 09/21/13 0857  BP: 140/65 142/93 143/97 157/81  Pulse: 92 104 98 78  Temp: 98.3 F (36.8 C) 98.8 F (37.1 C) 99.1 F (37.3 C) 98.1 F (36.7 C)  TempSrc: Oral Oral Oral Oral  Resp: 19 20 18 16   Height:      Weight:   249 lb 6.4 oz (113.127 kg)   SpO2: 100% 100% 100% 99%    Intake/Output Summary (Last 24 hours) at 09/21/13 1111 Last data filed at 09/20/13 1900  Gross per 24 hour  Intake    360 ml  Output    300 ml  Net     60 ml    PULM  CTAB CV  RRR VASC  L radial and brachial pulses palpable Vein mapping: 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

## 2013-09-22 NOTE — Transfer of Care (Signed)
Immediate Anesthesia Transfer of Care Note  Patient: Thomas Adams  Procedure(s) Performed: Procedure(s): ARTERIOVENOUS (AV) FISTULA CREATION RADIOCEPHALIC (Left) REMOVAL OF A DIALYSIS CATHETER OF TEMPORARY RIGHT INTERNAL JUGULAR  (Right) INSERTION OF DIALYSIS CATHETER; ULTRASOUND GUIDED (Right)  Patient Location: PACU  Anesthesia Type:General  Level of Consciousness: awake, alert  and oriented  Airway & Oxygen Therapy: Patient Spontanous Breathing and Patient connected to nasal cannula oxygen  Post-op Assessment: Report given to PACU RN and Post -op Vital signs reviewed and stable  Post vital signs: Reviewed and stable  Complications: No apparent anesthesia complications

## 2013-09-22 NOTE — Anesthesia Procedure Notes (Addendum)
Procedure Name: Intubation Date/Time: 09/22/2013 8:34 AM Performed by: Laretta Alstrom Pre-anesthesia Checklist: Patient identified, Emergency Drugs available, Suction available, Patient being monitored and Timeout performed Patient Re-evaluated:Patient Re-evaluated prior to inductionOxygen Delivery Method: Circle system utilized and Simple face mask Preoxygenation: Pre-oxygenation with 100% oxygen Intubation Type: IV induction Ventilation: Mask ventilation without difficulty Laryngoscope Size: Mac and 4 Grade View: Grade II Tube type: Oral Tube size: 7.5 mm Number of attempts: 1 Airway Equipment and Method: Stylet Placement Confirmation: ETT inserted through vocal cords under direct vision,  positive ETCO2,  CO2 detector and breath sounds checked- equal and bilateral Secured at: 24 cm Tube secured with: Tape Dental Injury: Teeth and Oropharynx as per pre-operative assessment

## 2013-09-22 NOTE — Anesthesia Preprocedure Evaluation (Signed)
Anesthesia Evaluation  Patient identified by MRN, date of birth, ID band Patient awake    Reviewed: Allergy & Precautions, H&P , NPO status , Patient's Chart, lab work & pertinent test results  Airway       Dental   Pulmonary          Cardiovascular hypertension,     Neuro/Psych    GI/Hepatic   Endo/Other    Renal/GU ESRF, Dialysis and CRFRenal disease     Musculoskeletal   Abdominal   Peds  Hematology  (+) anemia ,   Anesthesia Other Findings   Reproductive/Obstetrics                           Anesthesia Physical Anesthesia Plan  ASA: III  Anesthesia Plan: General and MAC   Post-op Pain Management:    Induction: Intravenous  Airway Management Planned: LMA and Mask  Additional Equipment:   Intra-op Plan:   Post-operative Plan: Extubation in OR  Informed Consent: I have reviewed the patients History and Physical, chart, labs and discussed the procedure including the risks, benefits and alternatives for the proposed anesthesia with the patient or authorized representative who has indicated his/her understanding and acceptance.     Plan Discussed with:   Anesthesia Plan Comments:         Anesthesia Quick Evaluation

## 2013-09-22 NOTE — Preoperative (Signed)
Beta Blockers   Reason not to administer Beta Blockers:Not Applicable 

## 2013-09-22 NOTE — Progress Notes (Signed)
Chart reviewed.  PROGRESS NOTE   [name] [id number] DOB: May 30, 1961 DOA: 09/17/2013 PCP: No PCP Per Patient  Brief narrative: Thomas Adams is an 53 y.o. male with a PMH of hypertension and chronic kidney disease stage III (baseline creatinine of 1.88 in 2012) who presented for elevated BP. Patient reported that 3 weeks prior he ran out of his lisinopril. 2 weeks prior he started feeling weak with nausea. He presented to [facility] to get a prescription for his blood pressure medications. In the ED he was found to be hypertensive at 250/143 and he was found to be in acute renal failure with a creatinine of 14.3. As [facility] did not have dialysis capabilities, he was transferred to [facility] for further management. He was also found to be anemic with a hemoglobin of 7.5.   Assessment/Plan: Principal Problem:   Acute on chronic renal failure Continue hemodialysis per nephrology. Serologies including SPEP, UPEP, HIV and hepatitis studies negative. Renal ultrasound done 09/18/13, negative for hydronephrosis. Continue renal diet. Active Problems:   Obesity Body mass index is 36.15 kg/(m^2). Dietician consulted for diet counseling.   Right kidney mass Further outpatient evaluation needed   Secondary hyperparathyroidism Phosphorus levels within normal limits.   Malignant hypertension Continue labetalol, amlodipine, isordil, hydralazine and as needed clonidine.   Anemia of chronic disease Continue Aranesp and iron.  Status post 1 unit of packed red blood cells on 09/17/13.   DVT Prophylaxis Continue heparin every 8 hours.  Code Status: Full. Family Communication: No family at the bedside. Disposition Plan: Home when stable.   procedures  2/07 - R IJ HD catheter insertion   AVF 2/11 Right tunnelled dialysis catheter 2/1  Medical Consultants:  Dr. Adele Barthel, Vascular Surgery  Dr. Edrick Oh, Nephrology  Anti-infectives:  None  HPI/Subjective: Thomas Adams denies pain, dyspnea, constipation, nausea/vomiting.  Objective: Filed Vitals:   09/22/13 1830 09/22/13 1900 09/22/13 1930 09/22/13 1942  BP: 174/99 186/103 181/101 175/96  Pulse: 84 88 92 96  Temp:    98.6 F (37 C)  TempSrc:    Oral  Resp: 20 20 20 20   Height:      Weight:      SpO2:        Intake/Output Summary (Last 24 hours) at 09/22/13 2016 Last data filed at 09/22/13 1942  Gross per 24 hour  Intake    720 ml  Output   4510 ml  Net  -3790 ml    Exam: Gen:  Asleep on dialysis. Arousable. Comfortable. Cardiovascular:  Regular rate rhythm without murmurs rubs Respiratory:  Lungs slightly course Gastrointestinal:  Abdomen soft, NT/ND, + BS Extremities:  No C/E/C  Data Reviewed: Basic Metabolic Panel:  Recent Labs Lab 09/19/13 0328 09/20/13 0310 09/20/13 0710 09/21/13 0515 09/22/13 0340  NA 137 137 136* 137 135*  K 4.4 4.8 4.6 4.7 4.5  CL 100 102 100 101 98  CO2 19 18* 19 21 21   GLUCOSE 103* 91 114* 87 95  BUN 70* 74* 71* 52* 57*  CREATININE 10.43* 11.42* 11.57* 9.62* 11.01*  CALCIUM 8.3* 8.2* 8.5 8.7 8.5  PHOS 4.6 4.6 4.5 4.6 5.0*   GFR Estimated Creatinine Clearance: 9.9 ml/min (by C-G formula based on Cr of 11.01). Liver Function Tests:  Recent Labs Lab 09/18/13 0100 09/19/13 0328 09/20/13 0310 09/20/13 0710 09/21/13 0515 09/22/13 0340  AST 9  --   --   --   --   --   ALT 20  --   --   --   --   --  ALKPHOS 66  --   --   --   --   --   BILITOT 0.4  --   --   --   --   --   PROT 6.7  --   --   --   --   --   ALBUMIN 3.6 3.2* 3.0* 3.3* 3.3* 3.1*   Coagulation profile  Recent Labs Lab 09/18/13 0100  INR 1.42    CBC:  Recent Labs Lab 09/18/13 0100 09/19/13 0328 09/20/13 0710  WBC 7.0 7.7 8.6  NEUTROABS 5.6  --   --   HGB 6.7* 7.7* 7.3*  HCT 20.5* 23.1* 21.6*  MCV 83.0 83.1 84.0  PLT 190 169 176   Cardiac Enzymes:  Recent Labs Lab 09/18/13 0100 09/18/13 0703  TROPONINI <0.30 <0.30   Microbiology Recent  Results (from the past 240 hour(s))  MRSA PCR SCREENING     Status: None   Collection Time    09/18/13 12:34 AM      Result Value Ref Range Status   MRSA by PCR NEGATIVE  NEGATIVE Final   Comment:            The GeneXpert MRSA Assay (FDA     approved for NASAL specimens     only), is one component of a     comprehensive MRSA colonization     surveillance program. It is not     intended to diagnose MRSA     infection nor to guide or     monitor treatment for     MRSA infections.     Procedures and Diagnostic Studies: US Renal  09/18/2013   CLINICAL DATA:  Chronic kidney disease, stage III.  EXAM: RENAL/URINARY TRACT ULTRASOUND COMPLETE  COMPARISON:  Abdominal CT 09/17/2013  FINDINGS: Right Kidney:  Length: 11.6 cm. There are small round hypoechoic structures in the right kidney mid and upper pole region. One measures 1.3 cm and the other measures up to 1.2 cm. There is a hypoechoic exophytic structure in the right kidney lower pole that measures 2.8 x 2.0 x 2.7 cm. Structure is predominantly hypoechoic but there are internal echogenic areas and the structure does not clearly have acoustic enhancement. This lesion corresponds with the hyperdense structure on the previous CT. Negative for hydronephrosis.  Left Kidney:  Length: 12.3 cm. Echogenicity within normal limits. No mass or hydronephrosis visualized.  Bladder:  Small amount of fluid in the urinary bladder. The prostate is prominent measuring 4.5 x 3.6 x 3.7 cm.  IMPRESSION: Negative for hydronephrosis.  There is a heterogeneous structure in the right kidney lower pole that may represent a complex cyst but indeterminate. Due to the chronic kidney disease, the patient is probably not a candidate for a pre and post-contrast MRI for more definitive evaluation. This lesion could be further evaluated with a non contrast MRI or follow-up with ultrasound.   Electronically Signed   By: Markus Daft M.D.   On: 09/18/2013 15:28   Dg Chest Port 1  View  09/18/2013   CLINICAL DATA:  Status post temporary dialysis catheter placement  EXAM: PORTABLE CHEST - 1 VIEW  COMPARISON:  09/17/2013  FINDINGS: Cardiac shadow is stable. A new right jugular dialysis catheter is noted with the tip at the cavoatrial junction. Mild vascular congestion is seen likely related to volume overload. No bony abnormality is noted. No pneumothorax is seen.  IMPRESSION: No pneumothorax following dialysis catheter placement.  Vascular congestion consistent with volume overload.   Electronically  Signed   By: Inez Catalina M.D.   On: 09/18/2013 09:01    Scheduled Meds: . amLODipine  10 mg Oral Daily  . calcium carbonate  400 mg of elemental calcium Oral TID WC  . darbepoetin (ARANESP) injection - DIALYSIS  150 mcg Intravenous Q Wed-HD  . doxercalciferol  4 mcg Intravenous Q M,W,F-HD  . feeding supplement (RESOURCE BREEZE)  1 Container Oral BID WC  . ferric gluconate (FERRLECIT/NULECIT) IV  125 mg Intravenous Daily  . heparin  5,000 Units Subcutaneous 3 times per day  . hydrALAZINE  10 mg Oral 3 times per day  . HYDROmorphone      . isosorbide dinitrate  10 mg Oral TID  . labetalol  200 mg Oral BID  . ondansetron      . pantoprazole  40 mg Oral Q1200  . promethazine       Continuous Infusions:   Time spent: 25 minutes.   LOS: 5 days   [facility] Pager [phone]. If unable to reach me by pager, please call my cell phone at [phone].  *Please note that the hospitalists switch teams on Wednesdays. Please call the flow manager at [phone] if you are having difficulty reaching the hospitalist taking care of this patient as she can update you and provide the most up-to-date pager number of provider caring for the patient. If 8PM-8AM, please contact night-coverage at [email or web address], password [facility]  09/22/2013, 8:16 PM

## 2013-09-22 NOTE — Progress Notes (Signed)
09/22/2013 3:18 PM Hemodialysis Outpatient Note; this patient has been accepted at the [facility] on a Tuesday, Thursday and Saturday 2nd shift schedule. Patient will have to arrange a time to visit the center on Thursday or Friday if patient is to start this Saturday September 25, 2013. Thank you. Gordy Savers

## 2013-09-22 NOTE — Interval H&P Note (Signed)
History and Physical Interval Note:  09/22/2013 8:15 AM  [name]  has presented today for surgery, with the diagnosis of End Stage Renal Disease  The various methods of treatment have been discussed with the patient and family. After consideration of risks, benefits and other options for treatment, the patient has consented to  Procedure(s): ARTERIOVENOUS (AV) FISTULA CREATION - LEFT RADIOCEPHALIC VS BRACHIOCEPHALIC (Left) REMOVAL OF A DIALYSIS CATHETER OF TEMPORARY RIGHT INTERNAL JUGULAR  (Right) INSERTION OF DIALYSIS CATHETER- RIGHT TUNNELLED DIALYSIS CATHETER (Right) as a surgical intervention .  The patient's history has been reviewed, patient examined, no change in status, stable for surgery.  I have reviewed the patient's chart and labs.  Questions were answered to the patient's satisfaction.     FIELDS,CHARLES E

## 2013-09-22 NOTE — Op Note (Signed)
Procedure: 1.  Removal temporary dialysis catheter                      2.  Placement right internal jugular vein diatek catheter with ultrasound                    3.  Left Radial Cephalic AV fistula   Preop: ESRD   Postop: ESRD   Anesthesia: MAC with local   Assistant: Gerri Lins, PA-c   Findings: 3 mm cephalic vein       2 mm radial artery                 23 cm right IJ diatek   Operative details: After obtaining informed consent, the patient was taken to the operating room. The patient was placed in supine position on the operating room table. After adequate sedation the patient's entire neck and chest were prepped and draped in usual sterile fashion. The patient was placed in Trendelenburg position. Ultrasound was used to identify the patient's right internal jugular vein. This had normal compressibility and respiratory variation. Local anesthesia was infiltrated over the right jugular vein.  Using ultrasound guidance, the right internal jugular vein was successfully cannulated.  A 0.035 J-tipped guidewire was threaded into the right internal jugular vein and into the superior vena cava followed by the inferior vena cava under fluoroscopic guidance.   Next sequential 12 and 14 dilators were placed over the guidewire into the right atrium.  A 16 French dilator with a peel-away sheath was then placed over the guidewire into the right atrium.   The guidewire and dilator were removed. A 23 cm Diatek catheter was then placed through the peel away sheath into the right atrium.  The catheter was then tunneled subcutaneously, cut to length, and the hub attached. The catheter was noted to flush and draw easily. The catheter was inspected under fluoroscopy and found with its tip to be in the right atrium without any kinks throughout its course. The catheter was sutured to the skin with nylon sutures. The neck insertion site was closed with Vicryl stitch. The catheter was then loaded with  concentrated Heparin solution. A dry sterile dressing was applied.  Next a preexisting right internal jugular vein temporary catheter was removed after removing the sutures.  The catheter was removed and hemostasis achieved with direct pressure.  Next, the left upper extremity was prepped and draped in usual sterile fashion. Local anesthesia was infiltrated midway between the cephalic and radial artery anatomically. The vein was not palpable due to the patients obesity. Ultrasound was used to identify the location of the vein. A longitudinal skin incision was then made in this location at the distal left forearm. The incision was carried into the subcutaneous tissues down to level cephalic vein. The vein had some spasm but was overall reasonable quality accepting a 3 mm dilator. The vein was dissected free circumferentially and small side branches ligated and divided between silk ties. The distal end was ligated and the vein probed and found to accept up to a 3 mm dilator. This was gently distended with heparinized saline, spatulated, and marked for orientation. Next the radial artery was dissected free in the medial portion incision. The artery was 2 mm in diameter but had a reasonable pulse. There was some spasm.  The vessel loops were placed proximal and distal to the planned site of arteriotomy. The patient was given 5000 units of intravenous heparin. After  appropriate circulation time, the vessel loops were used to control the artery. A longitudinal opening was made in the left radial artery. The vein was controlled proximally with a fine bulldog clamp. The vein was then swung over to the artery and sewn end of vein to side of artery using a running 7-0 Prolene suture. Just prior to completion, the anastomosis was fore bled back bled and thoroughly flushed. The anastomosis was secured, vessel loops released, and there was a palpable thrill in the fistula immediately. After hemostasis was obtained, the  subcutaneous tissues were reapproximated using a running 3-0 Vicryl suture. The skin was then closed with a 4 Vicryl subcuticular stitch. Dermabond was applied to the skin incision. The patient tolerated the procedure well and there were no complications. Instrument sponge and needle count were correct at the end of the case. The patient was taken to PACU in stable condition.   Ruta Hinds, MD  [facility]  Office: [phone]  Pager: [phone]

## 2013-09-22 NOTE — Progress Notes (Signed)
[facility] ROUNDING NOTE   Subjective:   Interval History: no new complaints   CLIP in process- no insurance may complicate.  VVS following - s/p avf and PC today.  A little out of it after surgery    Objective:  Vital signs in last 24 hours:  Temp:  [97.8 F (36.6 C)-98.7 F (37.1 C)] 97.8 F (36.6 C) (02/11 1130) Pulse Rate:  [75-122] 77 (02/11 1300) Resp:  [14-19] 19 (02/11 1300) BP: (150-184)/(83-112) 173/94 mmHg (02/11 1300) SpO2:  [96 %-100 %] 100 % (02/11 1300) Weight:  [114.261 kg (251 lb 14.4 oz)] 114.261 kg (251 lb 14.4 oz) (02/10 2052)  Weight change: 3.161 kg (6 lb 15.5 oz) Filed Weights   09/20/13 0940 09/20/13 2114 09/21/13 2052  Weight: 111.1 kg (244 lb 14.9 oz) 113.127 kg (249 lb 6.4 oz) 114.261 kg (251 lb 14.4 oz)    Intake/Output: I/O last 3 completed shifts: In: 360 [P.O.:360] Out: 950 [Urine:950]   Intake/Output this shift:  Total I/O In: 600 [I.V.:600] Out: 60 [Blood:60]  CVS- RRR RS- CTA Right IJ ABD- BS present soft non-distended EXT- no edema   Basic Metabolic Panel:  Recent Labs Lab 09/19/13 0328 09/20/13 0310 09/20/13 0710 09/21/13 0515 09/22/13 0340  NA 137 137 136* 137 135*  K 4.4 4.8 4.6 4.7 4.5  CL 100 102 100 101 98  CO2 19 18* 19 21 21   GLUCOSE 103* 91 114* 87 95  BUN 70* 74* 71* 52* 57*  CREATININE 10.43* 11.42* 11.57* 9.62* 11.01*  CALCIUM 8.3* 8.2* 8.5 8.7 8.5  PHOS 4.6 4.6 4.5 4.6 5.0*    Liver Function Tests:  Recent Labs Lab 09/18/13 0100 09/19/13 0328 09/20/13 0310 09/20/13 0710 09/21/13 0515 09/22/13 0340  AST 9  --   --   --   --   --   ALT 20  --   --   --   --   --   ALKPHOS 66  --   --   --   --   --   BILITOT 0.4  --   --   --   --   --   PROT 6.7  --   --   --   --   --   ALBUMIN 3.6 3.2* 3.0* 3.3* 3.3* 3.1*   No results found for this basename: LIPASE, AMYLASE,  in the last 168 hours No results found for this basename: AMMONIA,  in the last 168 hours  CBC:  Recent  Labs Lab 09/18/13 0100 09/19/13 0328 09/20/13 0710  WBC 7.0 7.7 8.6  NEUTROABS 5.6  --   --   HGB 6.7* 7.7* 7.3*  HCT 20.5* 23.1* 21.6*  MCV 83.0 83.1 84.0  PLT 190 169 176    Cardiac Enzymes:  Recent Labs Lab 09/18/13 0100 09/18/13 0703  TROPONINI <0.30 <0.30    BNP: No components found with this basename: POCBNP,   CBG: No results found for this basename: GLUCAP,  in the last 168 hours  Microbiology: Results for orders placed during the hospital encounter of 09/17/13  MRSA PCR SCREENING     Status: None   Collection Time    09/18/13 12:34 AM      Result Value Ref Range Status   MRSA by PCR NEGATIVE  NEGATIVE Final   Comment:            The GeneXpert MRSA Assay (FDA     approved for NASAL specimens     only),  is one component of a     comprehensive MRSA colonization     surveillance program. It is not     intended to diagnose MRSA     infection nor to guide or     monitor treatment for     MRSA infections.    Coagulation Studies: No results found for this basename: LABPROT, INR,  in the last 72 hours  Urinalysis: No results found for this basename: COLORURINE, APPERANCEUR, LABSPEC, PHURINE, GLUCOSEU, HGBUR, BILIRUBINUR, KETONESUR, PROTEINUR, UROBILINOGEN, NITRITE, LEUKOCYTESUR,  in the last 72 hours    Imaging: Dg Chest Port 1 View  09/22/2013   CLINICAL DATA:  Status post dialysis catheter insertion.  EXAM: PORTABLE CHEST - 1 VIEW  COMPARISON:  Chest x-ray 09/18/2013.  FINDINGS: Previously noted right IJ Vas-Cath has been removed, and there is a new right internal jugular PermCath in position with tips terminating at the superior cavoatrial junction and in the right atrium. Lung volumes are low. Mild diffuse interstitial prominence. Mild cephalization of the pulmonary vasculature. Mild cardiomegaly. No definite pleural effusions. No pneumothorax. Upper mediastinal contours are within normal limits.  IMPRESSION: 1. Interval placement of a right IJ PermCath, as  above, without acute complicating features. 2. Low lung volumes with probable mild interstitial pulmonary edema. 3. Mild cardiomegaly.   Electronically Signed   By: Vinnie Langton M.D.   On: 09/22/2013 12:56     Medications:     . amLODipine  10 mg Oral Daily  . darbepoetin (ARANESP) injection - DIALYSIS  150 mcg Intravenous Q Wed-HD  . doxercalciferol  4 mcg Intravenous Q M,W,F-HD  . feeding supplement (RESOURCE BREEZE)  1 Container Oral BID WC  . ferric gluconate (FERRLECIT/NULECIT) IV  125 mg Intravenous Daily  . heparin  5,000 Units Subcutaneous 3 times per day  . hydrALAZINE  10 mg Oral 3 times per day  . HYDROmorphone      . isosorbide dinitrate  10 mg Oral TID  . labetalol  200 mg Oral BID  . ondansetron      . pantoprazole  40 mg Oral Q1200  . promethazine       acetaminophen, acetaminophen, cloNIDine, famotidine, feeding supplement (NEPRO CARB STEADY), hydrALAZINE, ondansetron (ZOFRAN) IV, ondansetron, oxyCODONE-acetaminophen  Assessment/ Plan:  Assessment/Plan:  Newly recognized chronic renal disease stage V serologies negative so far- have initiated HD- s/p HD #2 on Monday via temp HD cath- now s/p PC  and avf , due for next HD for now planning #3 for today (wed).   Awaiting OP dialysis arrangements, unfortunately pt not having insurance may complicate.  Have contacted [place] [facility] directly so as to speed up the process HTN continue antihypertensives and challenge fluid- not terrible control at present on amlodipine/hydralazine and labetalol.  May be able to wean meds as HD gets more established.  Anemia  Iron studies low, will replete and also giving aranesp transfused previously, now watching closely Bones  - PTH 1100 - phos good on no binder at present- will start TUMS- have added vitamin D with HD S/p permcath and AVF soon appreciate  Dr Chen/Fields  Renal mass  I believe that urology will need to see patient as there are some worrying features of the Left  Kidney. This can be set up as an outpatient    LOS: 5 Jay Haskew A @TODAY @1 :18 PM

## 2013-09-22 NOTE — Anesthesia Postprocedure Evaluation (Signed)
  Anesthesia Post-op Note  Patient: Thomas Adams  Procedure(s) Performed: Procedure(s): ARTERIOVENOUS (AV) FISTULA CREATION RADIOCEPHALIC (Left) REMOVAL OF A DIALYSIS CATHETER OF TEMPORARY RIGHT INTERNAL JUGULAR  (Right) INSERTION OF DIALYSIS CATHETER; ULTRASOUND GUIDED (Right)  Patient Location: PACU  Anesthesia Type:General  Level of Consciousness: awake  Airway and Oxygen Therapy: Patient Spontanous Breathing  Post-op Pain: mild  Post-op Assessment: Post-op Vital signs reviewed  Post-op Vital Signs: Reviewed  Complications: No apparent anesthesia complications

## 2013-09-23 ENCOUNTER — Encounter (HOSPITAL_COMMUNITY): Payer: Self-pay | Admitting: Vascular Surgery

## 2013-09-23 DIAGNOSIS — Z992 Dependence on renal dialysis: Secondary | ICD-10-CM

## 2013-09-23 DIAGNOSIS — N186 End stage renal disease: Secondary | ICD-10-CM

## 2013-09-23 HISTORY — DX: Dependence on renal dialysis: N18.6

## 2013-09-23 LAB — RENAL FUNCTION PANEL
ALBUMIN: 3.2 g/dL — AB (ref 3.5–5.2)
BUN: 33 mg/dL — ABNORMAL HIGH (ref 6–23)
CHLORIDE: 96 meq/L (ref 96–112)
CO2: 25 meq/L (ref 19–32)
Calcium: 8.7 mg/dL (ref 8.4–10.5)
Creatinine, Ser: 7.83 mg/dL — ABNORMAL HIGH (ref 0.50–1.35)
GFR, EST AFRICAN AMERICAN: 8 mL/min — AB (ref >90)
GFR, EST NON AFRICAN AMERICAN: 7 mL/min — AB (ref >90)
Glucose, Bld: 119 mg/dL — ABNORMAL HIGH (ref 70–99)
Phosphorus: 5.1 mg/dL — ABNORMAL HIGH (ref 2.3–4.6)
Potassium: 4.1 mEq/L (ref 3.7–5.3)
SODIUM: 137 meq/L (ref 137–147)

## 2013-09-23 MED ORDER — ISOSORBIDE DINITRATE 10 MG PO TABS: 10.0000 mg | ORAL_TABLET | Freq: Three times a day (TID) | ORAL | Status: DC

## 2013-09-23 MED ORDER — OXYCODONE-ACETAMINOPHEN 5-325 MG PO TABS: 1.0000 | ORAL_TABLET | Freq: Three times a day (TID) | ORAL | Status: DC | PRN

## 2013-09-23 MED ORDER — HYDRALAZINE HCL 25 MG PO TABS: 25.0000 mg | ORAL_TABLET | Freq: Three times a day (TID) | ORAL | Status: AC

## 2013-09-23 MED ORDER — AMLODIPINE BESYLATE 10 MG PO TABS: 10.0000 mg | ORAL_TABLET | Freq: Every day | ORAL | Status: DC

## 2013-09-23 MED ORDER — ACETAMINOPHEN 325 MG PO TABS: 650.0000 mg | ORAL_TABLET | Freq: Four times a day (QID) | ORAL | Status: DC | PRN

## 2013-09-23 MED ORDER — LABETALOL HCL 200 MG PO TABS: 200.0000 mg | ORAL_TABLET | Freq: Two times a day (BID) | ORAL | Status: DC

## 2013-09-23 MED ORDER — HYDRALAZINE HCL 10 MG PO TABS: 10.0000 mg | ORAL_TABLET | Freq: Three times a day (TID) | ORAL | Status: DC

## 2013-09-23 NOTE — Discharge Summary (Signed)
Physician Discharge Summary  Thomas Adams [id number] DOB: 08/29/1960 DOA: 09/17/2013  PCP: No PCP Per Patient  Admit date: 09/17/2013 Discharge date: 09/23/2013  Time spent: greater than 30 minutes  Recommendations for Outpatient Follow-up:  1. Refer to urologist as outpatient  Discharge Diagnoses:  Principal Problem:   Acute renal failure, now ESRD Active Problems:   Malignant hypertension   Anemia Right kidney mass, needs outpatient referral to urologist.  Discharge Condition: stable  Filed Weights   09/21/13 2052 09/22/13 1600 09/22/13 2100  Weight: 114.261 kg (251 lb 14.4 oz) 112.8 kg (248 lb 10.9 oz) 110.7 kg (244 lb 0.8 oz)    History of present illness:  53 y.o. African American male with history of hypertension has been on lisinopril in the past, and chronic kidney disease stage III baseline creatinine of 1.88 in 2012 who presented with the above complaints. Patient reports that about 3 weeks ago he ran out of his lisinopril. 2 weeks ago he started feeling weak with nausea. He presented to [facility] to get a prescription for his blood pressure medications. In the emergency department he was found to be hypertensive 250/143 and he was found to be in acute renal failure with a creatinine of 14.3. As [facility] did not have dialysis capabilities, he was transferred to [facility] hospital for further management. He was also found to be anemic with a hemoglobin of 7.5. He denies any recent fevers, chills, chest pain, shortness of breath, abdominal pain, diarrhea, headaches or vision changes. He does admit to feeling nauseated in the morning and has been vomiting frequently, denies any blood in his emesis. Denies any overt bleeding or blood in stools.   Hospital Course:  Acute on chronic kidney failure  HD initiated, now ESRD. Initially via right IJ HD catheter.  AVF and Right tunnelled catheter Malignant HTN  Antihypertensives started. Normotensive by discharge R kidney  mass  Will need outpatient referral to urology Anemia w/o evidence of blood loss  Hgb improved w/ HD (volume removal) + 1U PRBC 2/6 - epo and Fe as per Renal protocol   procedures  2/07 - R IJ HD catheter insertion  AVF 2/11  Right tunnelled dialysis catheter 2/11 Hemodialysis  Medical Consultants:  Dr. Adele Barthel, Vascular Surgery  Dr. Edrick Oh, Nephrology   Discharge Exam: Filed Vitals:   09/23/13 1306  BP: 122/65  Pulse: 83  Temp: 98 F (36.7 C)  Resp: 19    General: alert, oriented Cardiovascular: RRR without MGR Respiratory: CTA without WRR  Discharge Instructions  Discharge Orders   Future Orders Complete By Expires   Activity as tolerated - No restrictions  As directed    Diet - low sodium heart healthy  As directed        Medication List         acetaminophen 325 MG tablet  Commonly known as:  TYLENOL  Take 2 tablets (650 mg total) by mouth every 6 (six) hours as needed for mild pain (or Fever >/= 101).     amLODipine 10 MG tablet  Commonly known as:  NORVASC  Take 1 tablet (10 mg total) by mouth daily.     cetirizine 10 MG tablet  Commonly known as:  ZYRTEC  Take 10 mg by mouth daily as needed for allergies.     hydrALAZINE 25 MG tablet  Commonly known as:  APRESOLINE  Take 1 tablet (25 mg total) by mouth every 8 (eight) hours.     labetalol 200  MG tablet  Commonly known as:  NORMODYNE  Take 1 tablet (200 mg total) by mouth 2 (two) times daily.     omeprazole 20 MG capsule  Commonly known as:  PRILOSEC  Take 20 mg by mouth 2 (two) times daily as needed (for indigestion).     oxyCODONE-acetaminophen 5-325 MG per tablet  Commonly known as:  PERCOCET/ROXICET  Take 1 tablet by mouth every 8 (eight) hours as needed for severe pain.       Allergies  Allergen Reactions  . Penicillins Nausea Only, Rash and Other (See Comments)    also dizziness       Follow-up Information   Follow up with Elam Dutch, MD In 6 weeks.    Specialty:  Vascular Surgery   Contact information:   [address] [place] [address] [phone]       Please follow up. [facility] TOMORROW)        The results of significant diagnostics from this hospitalization (including imaging, microbiology, ancillary and laboratory) are listed below for reference.    Significant Diagnostic Studies: US Renal  09/18/2013   CLINICAL DATA:  Chronic kidney disease, stage III.  EXAM: RENAL/URINARY TRACT ULTRASOUND COMPLETE  COMPARISON:  Abdominal CT 09/17/2013  FINDINGS: Right Kidney:  Length: 11.6 cm. There are small round hypoechoic structures in the right kidney mid and upper pole region. One measures 1.3 cm and the other measures up to 1.2 cm. There is a hypoechoic exophytic structure in the right kidney lower pole that measures 2.8 x 2.0 x 2.7 cm. Structure is predominantly hypoechoic but there are internal echogenic areas and the structure does not clearly have acoustic enhancement. This lesion corresponds with the hyperdense structure on the previous CT. Negative for hydronephrosis.  Left Kidney:  Length: 12.3 cm. Echogenicity within normal limits. No mass or hydronephrosis visualized.  Bladder:  Small amount of fluid in the urinary bladder. The prostate is prominent measuring 4.5 x 3.6 x 3.7 cm.  IMPRESSION: Negative for hydronephrosis.  There is a heterogeneous structure in the right kidney lower pole that may represent a complex cyst but indeterminate. Due to the chronic kidney disease, the patient is probably not a candidate for a pre and post-contrast MRI for more definitive evaluation. This lesion could be further evaluated with a non contrast MRI or follow-up with ultrasound.   Electronically Signed   By: Markus Daft M.D.   On: 09/18/2013 15:28   Dg Chest Port 1 View  09/22/2013   CLINICAL DATA:  Status post dialysis catheter insertion.  EXAM: PORTABLE CHEST - 1 VIEW  COMPARISON:  Chest x-ray 09/18/2013.  FINDINGS: Previously noted right IJ  Vas-Cath has been removed, and there is a new right internal jugular PermCath in position with tips terminating at the superior cavoatrial junction and in the right atrium. Lung volumes are low. Mild diffuse interstitial prominence. Mild cephalization of the pulmonary vasculature. Mild cardiomegaly. No definite pleural effusions. No pneumothorax. Upper mediastinal contours are within normal limits.  IMPRESSION: 1. Interval placement of a right IJ PermCath, as above, without acute complicating features. 2. Low lung volumes with probable mild interstitial pulmonary edema. 3. Mild cardiomegaly.   Electronically Signed   By: Vinnie Langton M.D.   On: 09/22/2013 12:56   Dg Chest Port 1 View  09/18/2013   CLINICAL DATA:  Status post temporary dialysis catheter placement  EXAM: PORTABLE CHEST - 1 VIEW  COMPARISON:  09/17/2013  FINDINGS: Cardiac shadow is stable. A new right jugular  dialysis catheter is noted with the tip at the cavoatrial junction. Mild vascular congestion is seen likely related to volume overload. No bony abnormality is noted. No pneumothorax is seen.  IMPRESSION: No pneumothorax following dialysis catheter placement.  Vascular congestion consistent with volume overload.   Electronically Signed   By: Inez Catalina M.D.   On: 09/18/2013 09:01    Microbiology: Recent Results (from the past 240 hour(s))  MRSA PCR SCREENING     Status: None   Collection Time    09/18/13 12:34 AM      Result Value Ref Range Status   MRSA by PCR NEGATIVE  NEGATIVE Final   Comment:            The GeneXpert MRSA Assay (FDA     approved for NASAL specimens     only), is one component of a     comprehensive MRSA colonization     surveillance program. It is not     intended to diagnose MRSA     infection nor to guide or     monitor treatment for     MRSA infections.     Labs: Basic Metabolic Panel:  Recent Labs Lab 09/20/13 0310 09/20/13 0710 09/21/13 0515 09/22/13 0340 09/23/13 0427  NA 137 136*  137 135* 137  K 4.8 4.6 4.7 4.5 4.1  CL 102 100 101 98 96  CO2 18* 19 21 21 25   GLUCOSE 91 114* 87 95 119*  BUN 74* 71* 52* 57* 33*  CREATININE 11.42* 11.57* 9.62* 11.01* 7.83*  CALCIUM 8.2* 8.5 8.7 8.5 8.7  PHOS 4.6 4.5 4.6 5.0* 5.1*   Liver Function Tests:  Recent Labs Lab 09/18/13 0100  09/20/13 0310 09/20/13 0710 09/21/13 0515 09/22/13 0340 09/23/13 0427  AST 9  --   --   --   --   --   --   ALT 20  --   --   --   --   --   --   ALKPHOS 66  --   --   --   --   --   --   BILITOT 0.4  --   --   --   --   --   --   PROT 6.7  --   --   --   --   --   --   ALBUMIN 3.6  < > 3.0* 3.3* 3.3* 3.1* 3.2*  < > = values in this interval not displayed. No results found for this basename: LIPASE, AMYLASE,  in the last 168 hours No results found for this basename: AMMONIA,  in the last 168 hours CBC:  Recent Labs Lab 09/18/13 0100 09/19/13 0328 09/20/13 0710  WBC 7.0 7.7 8.6  NEUTROABS 5.6  --   --   HGB 6.7* 7.7* 7.3*  HCT 20.5* 23.1* 21.6*  MCV 83.0 83.1 84.0  PLT 190 169 176   Cardiac Enzymes:  Recent Labs Lab 09/18/13 0100 09/18/13 0703  TROPONINI <0.30 <0.30   BNP: BNP (last 3 results) No results found for this basename: PROBNP,  in the last 8760 hours CBG: No results found for this basename: GLUCAP,  in the last 168 hours     Signed:  [place] L  [facility] 09/23/2013, 3:25 PM

## 2013-09-23 NOTE — Progress Notes (Signed)
   Daily Progress Note  Assessment/Planning: POD #1 s/p L RC AVF, RIJV TDC   No signs of steal  Follow up with Dr. Oneida Alar in office in 6 weeks  Subjective  - 1 Day Post-Op  C/o R hand numbness  Objective Filed Vitals:   09/22/13 1942 09/22/13 2100 09/23/13 0414 09/23/13 0834  BP: 175/96 167/92 138/84 164/88  Pulse: 96 99 87 82  Temp: 98.6 F (37 C) 99 F (37.2 C) 99.4 F (37.4 C) 98.6 F (37 C)  TempSrc: Oral Oral Oral Oral  Resp: 20 20 18 19   Height:      Weight:  244 lb 0.8 oz (110.7 kg)    SpO2:  95% 95% 96%    Intake/Output Summary (Last 24 hours) at 09/23/13 1059 Last data filed at 09/23/13 0900  Gross per 24 hour  Intake    460 ml  Output   4060 ml  Net  -3600 ml    PULM  CTAB CV  RRR, RIJ TDC bandaged GI  soft, NTND VASC  L hand grip 5/5, warm hand, palpable RC AVF thrill, +bruit, inc c/d/i  Laboratory CBC    Component Value Date/Time   WBC 8.6 09/20/2013 0710   HGB 7.3* 09/20/2013 0710   HCT 21.6* 09/20/2013 0710   PLT 176 09/20/2013 0710    BMET    Component Value Date/Time   NA 137 09/23/2013 0427   K 4.1 09/23/2013 0427   CL 96 09/23/2013 0427   CO2 25 09/23/2013 0427   GLUCOSE 119* 09/23/2013 0427   BUN 33* 09/23/2013 0427   CREATININE 7.83* 09/23/2013 0427   CALCIUM 8.7 09/23/2013 0427   GFRNONAA 7* 09/23/2013 0427   GFRAA 8* 09/23/2013 0427    Adele Barthel, MD [facility] Office: [phone] Pager: [phone]  09/23/2013, 10:59 AM

## 2013-09-23 NOTE — Progress Notes (Signed)
[facility] ROUNDING NOTE   Subjective:   Interval History: Had a  Busy day yesterday- AVF and PC followed by dialysis- still feeling groggy- has OP spot in [place] to start Saturday.    Objective:  Vital signs in last 24 hours:  Temp:  [97.8 F (36.6 C)-99.4 F (37.4 C)] 98.6 F (37 C) (02/12 0834) Pulse Rate:  [74-99] 82 (02/12 0834) Resp:  [14-20] 19 (02/12 0834) BP: (138-194)/(83-110) 164/88 mmHg (02/12 0834) SpO2:  [95 %-100 %] 96 % (02/12 0834) Weight:  [110.7 kg (244 lb 0.8 oz)-112.8 kg (248 lb 10.9 oz)] 110.7 kg (244 lb 0.8 oz) (02/11 2100)  Weight change: -1.461 kg (-3 lb 3.5 oz) Filed Weights   09/21/13 2052 09/22/13 1600 09/22/13 2100  Weight: 114.261 kg (251 lb 14.4 oz) 112.8 kg (248 lb 10.9 oz) 110.7 kg (244 lb 0.8 oz)    Intake/Output: I/O last 3 completed shifts: In: 960 [P.O.:360; I.V.:600] Out: [id number] [Urine:450; Other:4000; Blood:60]   Intake/Output this shift:  Total I/O In: 120 [P.O.:120] Out: -   CVS- RRR RS- CTA Right IJ ABD- BS present soft non-distended EXT- no edema- new left avf - with thrill   Basic Metabolic Panel:  Recent Labs Lab 09/20/13 0310 09/20/13 0710 09/21/13 0515 09/22/13 0340 09/23/13 0427  NA 137 136* 137 135* 137  K 4.8 4.6 4.7 4.5 4.1  CL 102 100 101 98 96  CO2 18* 19 21 21 25   GLUCOSE 91 114* 87 95 119*  BUN 74* 71* 52* 57* 33*  CREATININE 11.42* 11.57* 9.62* 11.01* 7.83*  CALCIUM 8.2* 8.5 8.7 8.5 8.7  PHOS 4.6 4.5 4.6 5.0* 5.1*    Liver Function Tests:  Recent Labs Lab 09/18/13 0100  09/20/13 0310 09/20/13 0710 09/21/13 0515 09/22/13 0340 09/23/13 0427  AST 9  --   --   --   --   --   --   ALT 20  --   --   --   --   --   --   ALKPHOS 66  --   --   --   --   --   --   BILITOT 0.4  --   --   --   --   --   --   PROT 6.7  --   --   --   --   --   --   ALBUMIN 3.6  < > 3.0* 3.3* 3.3* 3.1* 3.2*  < > = values in this interval not displayed. No results found for this basename: LIPASE,  AMYLASE,  in the last 168 hours No results found for this basename: AMMONIA,  in the last 168 hours  CBC:  Recent Labs Lab 09/18/13 0100 09/19/13 0328 09/20/13 0710  WBC 7.0 7.7 8.6  NEUTROABS 5.6  --   --   HGB 6.7* 7.7* 7.3*  HCT 20.5* 23.1* 21.6*  MCV 83.0 83.1 84.0  PLT 190 169 176    Cardiac Enzymes:  Recent Labs Lab 09/18/13 0100 09/18/13 0703  TROPONINI <0.30 <0.30    BNP: No components found with this basename: POCBNP,   CBG: No results found for this basename: GLUCAP,  in the last 168 hours  Microbiology: Results for orders placed during the hospital encounter of 09/17/13  MRSA PCR SCREENING     Status: None   Collection Time    09/18/13 12:34 AM      Result Value Ref Range Status   MRSA by PCR NEGATIVE  NEGATIVE Final   Comment:            The GeneXpert MRSA Assay (FDA     approved for NASAL specimens     only), is one component of a     comprehensive MRSA colonization     surveillance program. It is not     intended to diagnose MRSA     infection nor to guide or     monitor treatment for     MRSA infections.    Coagulation Studies: No results found for this basename: LABPROT, INR,  in the last 72 hours  Urinalysis: No results found for this basename: COLORURINE, APPERANCEUR, LABSPEC, PHURINE, GLUCOSEU, HGBUR, BILIRUBINUR, KETONESUR, PROTEINUR, UROBILINOGEN, NITRITE, LEUKOCYTESUR,  in the last 72 hours    Imaging: Dg Chest Port 1 View  09/22/2013   CLINICAL DATA:  Status post dialysis catheter insertion.  EXAM: PORTABLE CHEST - 1 VIEW  COMPARISON:  Chest x-ray 09/18/2013.  FINDINGS: Previously noted right IJ Vas-Cath has been removed, and there is a new right internal jugular PermCath in position with tips terminating at the superior cavoatrial junction and in the right atrium. Lung volumes are low. Mild diffuse interstitial prominence. Mild cephalization of the pulmonary vasculature. Mild cardiomegaly. No definite pleural effusions. No  pneumothorax. Upper mediastinal contours are within normal limits.  IMPRESSION: 1. Interval placement of a right IJ PermCath, as above, without acute complicating features. 2. Low lung volumes with probable mild interstitial pulmonary edema. 3. Mild cardiomegaly.   Electronically Signed   By: Vinnie Langton M.D.   On: 09/22/2013 12:56     Medications:     . amLODipine  10 mg Oral Daily  . calcium carbonate  400 mg of elemental calcium Oral TID WC  . darbepoetin (ARANESP) injection - DIALYSIS  150 mcg Intravenous Q Wed-HD  . doxercalciferol  4 mcg Intravenous Q M,W,F-HD  . feeding supplement (RESOURCE BREEZE)  1 Container Oral BID WC  . heparin  5,000 Units Subcutaneous 3 times per day  . hydrALAZINE  10 mg Oral 3 times per day  . isosorbide dinitrate  10 mg Oral TID  . labetalol  200 mg Oral BID  . pantoprazole  40 mg Oral Q1200   sodium chloride, sodium chloride, acetaminophen, acetaminophen, cloNIDine, famotidine, feeding supplement (NEPRO CARB STEADY), feeding supplement (NEPRO CARB STEADY), heparin, heparin, hydrALAZINE, lidocaine (PF), lidocaine-prilocaine, ondansetron (ZOFRAN) IV, ondansetron, oxyCODONE-acetaminophen, pentafluoroprop-tetrafluoroeth  Assessment/ Plan:  Assessment/Plan:  Newly recognized chronic renal disease stage V  have initiated HD- s/p HD #2 on 2/9, HD#3 on 2/11  now s/p PC  and avf  Now has spot established at the [facility] for TTS- to start on Saturday but needs to go there Friday to "get admitted"  Is clear for discharge from our standpoint- is saying that he is not ready but hopefully will feel better as the day goes on.  HTN continue antihypertensives and challenge fluid- UF of 4 liters yest- not terrible control at present on amlodipine/hydralazine and labetalol.  May be able to wean meds as HD gets more established.  Anemia  Iron studies low, have repleted and also giving aranesp transfused previously, now watching closely Bones  - PTH 1100 - phos good on  no binder at present- will start TUMS- have added vitamin D with HD S/p permcath and AVF 2/11 Dr Chen/Fields  Renal mass  I believe that urology will need to see patient as there are some worrying features of the Left Kidney. This  can be set up as an outpatient    LOS: 6 Kataya Guimont A @TODAY @10 :23 AM

## 2013-09-23 NOTE — Progress Notes (Signed)
09/23/13 1930 nsg Patient d/c to home. D/c instructions understood.

## 2013-09-27 ENCOUNTER — Telehealth: Payer: Self-pay | Admitting: Vascular Surgery

## 2013-09-27 NOTE — Telephone Encounter (Signed)
Pt lm on answering service inquiring about his 6 w f/u w/ CEF from his surgery on 09/22/13, I informed pt that we sch post-ops via staff message and someone will call him w/ appt, 09/26/14 mar

## 2013-10-04 ENCOUNTER — Telehealth: Payer: Self-pay | Admitting: Vascular Surgery

## 2013-10-04 NOTE — Telephone Encounter (Signed)
l/m for patient fu on 11-04-13 at 8:45 with dr. Oneida Alar

## 2013-11-03 ENCOUNTER — Encounter: Payer: Self-pay | Admitting: Vascular Surgery

## 2013-11-04 ENCOUNTER — Ambulatory Visit (INDEPENDENT_AMBULATORY_CARE_PROVIDER_SITE_OTHER): Payer: Self-pay | Admitting: Vascular Surgery

## 2013-11-04 ENCOUNTER — Encounter: Payer: Self-pay | Admitting: Vascular Surgery

## 2013-11-04 VITALS — BP 192/102 | HR 86 | Ht 69.0 in | Wt 245.0 lb

## 2013-11-04 DIAGNOSIS — N186 End stage renal disease: Secondary | ICD-10-CM

## 2013-11-04 HISTORY — DX: End stage renal disease: N18.6

## 2013-11-04 NOTE — Progress Notes (Signed)
Patient is a 53 year old male who is status post placement of a left radiocephalic AV fistula on February 11. He returns for followup today. He is currently dialyzing via a right-sided catheter.  He denies any numbness or tingling in his hand. He has no drainage from the incision.  Physical exam:  Filed Vitals:   11/04/13 0935  BP: 192/102  Pulse: 86  Height: 5\' 9"  (1.753 m)  Weight: 245 lb (111.131 kg)  SpO2: 100%    Left upper extremity: Palpable thrill in fistula well-healed incision fistula is visible throughout the proximal forearm  Assessment: Maturing left radiocephalic AV fistula  Plan: Continue to exercise fistula. Start cannulation in 6 weeks. Followup as needed.  Ruta Hinds, MD [facility] Office: [phone] Pager: [phone]

## 2013-12-06 ENCOUNTER — Other Ambulatory Visit: Payer: Self-pay | Admitting: *Deleted

## 2013-12-06 DIAGNOSIS — T82598A Other mechanical complication of other cardiac and vascular devices and implants, initial encounter: Secondary | ICD-10-CM

## 2013-12-10 ENCOUNTER — Encounter: Payer: Self-pay | Admitting: Surgery

## 2013-12-10 ENCOUNTER — Other Ambulatory Visit: Payer: Self-pay | Admitting: Urology

## 2013-12-10 DIAGNOSIS — N2889 Other specified disorders of kidney and ureter: Secondary | ICD-10-CM

## 2013-12-11 DIAGNOSIS — N186 End stage renal disease: Secondary | ICD-10-CM | POA: Diagnosis not present

## 2013-12-11 DIAGNOSIS — N2581 Secondary hyperparathyroidism of renal origin: Secondary | ICD-10-CM | POA: Diagnosis not present

## 2013-12-11 DIAGNOSIS — N039 Chronic nephritic syndrome with unspecified morphologic changes: Secondary | ICD-10-CM | POA: Diagnosis not present

## 2013-12-11 DIAGNOSIS — D509 Iron deficiency anemia, unspecified: Secondary | ICD-10-CM | POA: Diagnosis not present

## 2013-12-11 DIAGNOSIS — D631 Anemia in chronic kidney disease: Secondary | ICD-10-CM | POA: Diagnosis not present

## 2013-12-13 ENCOUNTER — Ambulatory Visit (HOSPITAL_COMMUNITY)
Admission: RE | Admit: 2013-12-13 | Discharge: 2013-12-13 | Disposition: A | Discharge status: Home / Self Care | Payer: Medicaid Other | Source: Ambulatory Visit | Attending: Surgery | Admitting: Surgery

## 2013-12-13 ENCOUNTER — Ambulatory Visit (INDEPENDENT_AMBULATORY_CARE_PROVIDER_SITE_OTHER): Payer: Medicaid Other | Admitting: Surgery

## 2013-12-13 ENCOUNTER — Encounter: Payer: Self-pay | Admitting: Surgery

## 2013-12-13 VITALS — BP 187/98 | HR 80 | Ht 69.0 in | Wt 243.5 lb

## 2013-12-13 DIAGNOSIS — N186 End stage renal disease: Secondary | ICD-10-CM

## 2013-12-13 DIAGNOSIS — T82598A Other mechanical complication of other cardiac and vascular devices and implants, initial encounter: Secondary | ICD-10-CM | POA: Insufficient documentation

## 2013-12-13 DIAGNOSIS — Y838 Other surgical procedures as the cause of abnormal reaction of the patient, or of later complication, without mention of misadventure at the time of the procedure: Secondary | ICD-10-CM | POA: Insufficient documentation

## 2013-12-13 NOTE — Progress Notes (Signed)
Patient name: Thomas Adams MRN: [id number] DOB: [birth year] Sex: male     Chief Complaint  Patient presents with  . Re-evaluation    competing vein (AVF)    HISTORY OF PRESENT ILLNESS: The patient is back today for followup.  He is status post left radiocephalic fistula on [id number] by Dr. Oneida Alar.  He had an excellent thrill of his first postoperative visit and has not been seen back since that time.  He is referred back for non-maturation.  He dialyzes through a catheter.  Past Medical History  Diagnosis Date  . Hypertension   . CKD (chronic kidney disease), stage III     Past Surgical History  Procedure Laterality Date  . Av fistula placement Left 09/22/2013    Procedure: ARTERIOVENOUS (AV) FISTULA CREATION RADIOCEPHALIC;  Surgeon: Elam Dutch, MD;  Location: [facility] OR;  Service: Vascular;  Laterality: Left;  . Removal of a dialysis catheter Right 09/22/2013    Procedure: REMOVAL OF A DIALYSIS CATHETER OF TEMPORARY RIGHT INTERNAL JUGULAR ;  Surgeon: Elam Dutch, MD;  Location: [place];  Service: Vascular;  Laterality: Right;  . Insertion of dialysis catheter Right 09/22/2013    Procedure: INSERTION OF DIALYSIS CATHETER; ULTRASOUND GUIDED;  Surgeon: Elam Dutch, MD;  Location: [place];  Service: Vascular;  Laterality: Right;    History   Social History  . Marital Status: Divorced    Spouse Name: N/A    Number of Children: N/A  . Years of Education: N/A   Occupational History  . Not on file.   Social History Main Topics  . Smoking status: Never Smoker   . Smokeless tobacco: Not on file  . Alcohol Use: No  . Drug Use: Not on file  . Sexual Activity: Not on file   Other Topics Concern  . Not on file   Social History Narrative  . No narrative on file    Family History  Problem Relation Age of Onset  . Hypertension Father     Allergies as of 12/13/2013 - Review Complete 12/13/2013  Allergen Reaction Noted  . Penicillins Nausea Only, Rash, and Other  (See Comments) 09/18/2013    Current Outpatient Prescriptions on File Prior to Visit  Medication Sig Dispense Refill  . acetaminophen (TYLENOL) 325 MG tablet Take 2 tablets (650 mg total) by mouth every 6 (six) hours as needed for mild pain (or Fever >/= 101).      Marland Kitchen amLODipine (NORVASC) 10 MG tablet Take 1 tablet (10 mg total) by mouth daily.  30 tablet  0  . cetirizine (ZYRTEC) 10 MG tablet Take 10 mg by mouth daily as needed for allergies.      . hydrALAZINE (APRESOLINE) 25 MG tablet Take 1 tablet (25 mg total) by mouth every 8 (eight) hours.  90 tablet  0  . labetalol (NORMODYNE) 200 MG tablet Take 1 tablet (200 mg total) by mouth 2 (two) times daily.  60 tablet  0  . omeprazole (PRILOSEC) 20 MG capsule Take 20 mg by mouth 2 (two) times daily as needed (for indigestion).      Marland Kitchen oxyCODONE-acetaminophen (PERCOCET/ROXICET) 5-325 MG per tablet Take 1 tablet by mouth every 8 (eight) hours as needed for severe pain.  20 tablet  0   No current facility-administered medications on file prior to visit.     REVIEW OF SYSTEMS: Cardiovascular: No chest pain, chest pressure, palpitations, orthopnea, or dyspnea on exertion. No claudication or rest pain,  No history  of DVT or phlebitis. Pulmonary: No productive cough, asthma or wheezing. Neurologic: No weakness, paresthesias, aphasia, or amaurosis. No dizziness. Hematologic: No bleeding problems or clotting disorders. Musculoskeletal: No joint pain or joint swelling. Gastrointestinal: No blood in stool or hematemesis Genitourinary: No dysuria or hematuria. Psychiatric:: No history of major depression. Integumentary: No rashes or ulcers. Constitutional: No fever or chills.  PHYSICAL EXAMINATION:   Vital signs are BP   Ht 5\' 9"  (1.753 m)  Wt 243 lb 8 oz (110.451 kg)  BMI 35.94 kg/m2 General: The patient appears their stated age. HEENT:  No gross abnormalities Pulmonary:  Non labored breathing Abdomen: Soft and non-tender Musculoskeletal:  There are no major deformities. Neurologic: No focal weakness or paresthesias are detected, Skin: There are no ulcer or rashes noted. Psychiatric: The patient has normal affect. Cardiovascular: Excellent left radiocephalic fistula thrill.  There is a large vein figures towards the hand.  With compression of this vein there is no significant change in the patient's thrill   Diagnostic Studies Ultrasound was ordered and reviewed.  The cephalic vein has excellent diameters measuring from 0.56-0.77.  The depths are appropriate.  There multiple branches in the upper arm, 1 at the antecubital crease and one in the distal forearm.  Assessment: End-stage renal disease Plan: There is one prominent branch just proximal to the anastomosis.  Compression of this branch is not changed the thrill within the fistula.  The fistula has excellent thrill and is of excellent diameter.  I do not think ligation of its branches can have a significant impact on the utilization of this fistula.  I would recommend starting to use his fistula.  There any difficulties, branch ligation can be considered at that time.  I had a lengthy discussion with the patient regarding need to be on the transplant list.  He is to contact Dr. Justin Mend for further follow up  V. Leia Alf, M.D. [facility] Office: [phone] Pager:  [phone]

## 2013-12-14 DIAGNOSIS — N2581 Secondary hyperparathyroidism of renal origin: Secondary | ICD-10-CM | POA: Diagnosis not present

## 2013-12-14 DIAGNOSIS — N186 End stage renal disease: Secondary | ICD-10-CM | POA: Diagnosis not present

## 2013-12-14 DIAGNOSIS — D509 Iron deficiency anemia, unspecified: Secondary | ICD-10-CM | POA: Diagnosis not present

## 2013-12-14 DIAGNOSIS — D631 Anemia in chronic kidney disease: Secondary | ICD-10-CM | POA: Diagnosis not present

## 2013-12-16 DIAGNOSIS — N186 End stage renal disease: Secondary | ICD-10-CM | POA: Diagnosis not present

## 2013-12-16 DIAGNOSIS — D509 Iron deficiency anemia, unspecified: Secondary | ICD-10-CM | POA: Diagnosis not present

## 2013-12-16 DIAGNOSIS — N2581 Secondary hyperparathyroidism of renal origin: Secondary | ICD-10-CM | POA: Diagnosis not present

## 2013-12-16 DIAGNOSIS — D631 Anemia in chronic kidney disease: Secondary | ICD-10-CM | POA: Diagnosis not present

## 2013-12-18 DIAGNOSIS — N039 Chronic nephritic syndrome with unspecified morphologic changes: Secondary | ICD-10-CM | POA: Diagnosis not present

## 2013-12-18 DIAGNOSIS — N2581 Secondary hyperparathyroidism of renal origin: Secondary | ICD-10-CM | POA: Diagnosis not present

## 2013-12-18 DIAGNOSIS — D631 Anemia in chronic kidney disease: Secondary | ICD-10-CM | POA: Diagnosis not present

## 2013-12-18 DIAGNOSIS — D509 Iron deficiency anemia, unspecified: Secondary | ICD-10-CM | POA: Diagnosis not present

## 2013-12-18 DIAGNOSIS — N186 End stage renal disease: Secondary | ICD-10-CM | POA: Diagnosis not present

## 2013-12-21 DIAGNOSIS — N039 Chronic nephritic syndrome with unspecified morphologic changes: Secondary | ICD-10-CM | POA: Diagnosis not present

## 2013-12-21 DIAGNOSIS — D631 Anemia in chronic kidney disease: Secondary | ICD-10-CM | POA: Diagnosis not present

## 2013-12-21 DIAGNOSIS — N2581 Secondary hyperparathyroidism of renal origin: Secondary | ICD-10-CM | POA: Diagnosis not present

## 2013-12-21 DIAGNOSIS — N186 End stage renal disease: Secondary | ICD-10-CM | POA: Diagnosis not present

## 2013-12-21 DIAGNOSIS — D509 Iron deficiency anemia, unspecified: Secondary | ICD-10-CM | POA: Diagnosis not present

## 2013-12-23 ENCOUNTER — Inpatient Hospital Stay: Admission: RE | Admit: 2013-12-23 | Payer: Medicaid Other | Source: Ambulatory Visit

## 2013-12-23 DIAGNOSIS — N2581 Secondary hyperparathyroidism of renal origin: Secondary | ICD-10-CM | POA: Diagnosis not present

## 2013-12-23 DIAGNOSIS — N186 End stage renal disease: Secondary | ICD-10-CM | POA: Diagnosis not present

## 2013-12-23 DIAGNOSIS — D631 Anemia in chronic kidney disease: Secondary | ICD-10-CM | POA: Diagnosis not present

## 2013-12-23 DIAGNOSIS — D509 Iron deficiency anemia, unspecified: Secondary | ICD-10-CM | POA: Diagnosis not present

## 2013-12-25 DIAGNOSIS — N2581 Secondary hyperparathyroidism of renal origin: Secondary | ICD-10-CM | POA: Diagnosis not present

## 2013-12-25 DIAGNOSIS — D509 Iron deficiency anemia, unspecified: Secondary | ICD-10-CM | POA: Diagnosis not present

## 2013-12-25 DIAGNOSIS — D631 Anemia in chronic kidney disease: Secondary | ICD-10-CM | POA: Diagnosis not present

## 2013-12-25 DIAGNOSIS — N186 End stage renal disease: Secondary | ICD-10-CM | POA: Diagnosis not present

## 2013-12-28 DIAGNOSIS — N2581 Secondary hyperparathyroidism of renal origin: Secondary | ICD-10-CM | POA: Diagnosis not present

## 2013-12-28 DIAGNOSIS — D631 Anemia in chronic kidney disease: Secondary | ICD-10-CM | POA: Diagnosis not present

## 2013-12-28 DIAGNOSIS — N039 Chronic nephritic syndrome with unspecified morphologic changes: Secondary | ICD-10-CM | POA: Diagnosis not present

## 2013-12-28 DIAGNOSIS — N186 End stage renal disease: Secondary | ICD-10-CM | POA: Diagnosis not present

## 2013-12-28 DIAGNOSIS — D509 Iron deficiency anemia, unspecified: Secondary | ICD-10-CM | POA: Diagnosis not present

## 2013-12-30 DIAGNOSIS — D509 Iron deficiency anemia, unspecified: Secondary | ICD-10-CM | POA: Diagnosis not present

## 2013-12-30 DIAGNOSIS — N2581 Secondary hyperparathyroidism of renal origin: Secondary | ICD-10-CM | POA: Diagnosis not present

## 2013-12-30 DIAGNOSIS — N186 End stage renal disease: Secondary | ICD-10-CM | POA: Diagnosis not present

## 2013-12-30 DIAGNOSIS — D631 Anemia in chronic kidney disease: Secondary | ICD-10-CM | POA: Diagnosis not present

## 2014-01-01 DIAGNOSIS — N2581 Secondary hyperparathyroidism of renal origin: Secondary | ICD-10-CM | POA: Diagnosis not present

## 2014-01-01 DIAGNOSIS — N186 End stage renal disease: Secondary | ICD-10-CM | POA: Diagnosis not present

## 2014-01-01 DIAGNOSIS — D631 Anemia in chronic kidney disease: Secondary | ICD-10-CM | POA: Diagnosis not present

## 2014-01-01 DIAGNOSIS — D509 Iron deficiency anemia, unspecified: Secondary | ICD-10-CM | POA: Diagnosis not present

## 2014-01-04 DIAGNOSIS — D509 Iron deficiency anemia, unspecified: Secondary | ICD-10-CM | POA: Diagnosis not present

## 2014-01-04 DIAGNOSIS — N186 End stage renal disease: Secondary | ICD-10-CM | POA: Diagnosis not present

## 2014-01-04 DIAGNOSIS — D631 Anemia in chronic kidney disease: Secondary | ICD-10-CM | POA: Diagnosis not present

## 2014-01-04 DIAGNOSIS — N2581 Secondary hyperparathyroidism of renal origin: Secondary | ICD-10-CM | POA: Diagnosis not present

## 2014-01-06 DIAGNOSIS — D631 Anemia in chronic kidney disease: Secondary | ICD-10-CM | POA: Diagnosis not present

## 2014-01-06 DIAGNOSIS — D509 Iron deficiency anemia, unspecified: Secondary | ICD-10-CM | POA: Diagnosis not present

## 2014-01-06 DIAGNOSIS — N186 End stage renal disease: Secondary | ICD-10-CM | POA: Diagnosis not present

## 2014-01-06 DIAGNOSIS — N2581 Secondary hyperparathyroidism of renal origin: Secondary | ICD-10-CM | POA: Diagnosis not present

## 2014-01-08 DIAGNOSIS — N186 End stage renal disease: Secondary | ICD-10-CM | POA: Diagnosis not present

## 2014-01-08 DIAGNOSIS — N2581 Secondary hyperparathyroidism of renal origin: Secondary | ICD-10-CM | POA: Diagnosis not present

## 2014-01-08 DIAGNOSIS — N039 Chronic nephritic syndrome with unspecified morphologic changes: Secondary | ICD-10-CM | POA: Diagnosis not present

## 2014-01-08 DIAGNOSIS — D631 Anemia in chronic kidney disease: Secondary | ICD-10-CM | POA: Diagnosis not present

## 2014-01-08 DIAGNOSIS — D509 Iron deficiency anemia, unspecified: Secondary | ICD-10-CM | POA: Diagnosis not present

## 2014-01-09 DIAGNOSIS — N186 End stage renal disease: Secondary | ICD-10-CM | POA: Diagnosis not present

## 2014-01-11 DIAGNOSIS — D509 Iron deficiency anemia, unspecified: Secondary | ICD-10-CM | POA: Diagnosis not present

## 2014-01-11 DIAGNOSIS — N186 End stage renal disease: Secondary | ICD-10-CM | POA: Diagnosis not present

## 2014-01-11 DIAGNOSIS — D631 Anemia in chronic kidney disease: Secondary | ICD-10-CM | POA: Diagnosis not present

## 2014-01-11 DIAGNOSIS — N2581 Secondary hyperparathyroidism of renal origin: Secondary | ICD-10-CM | POA: Diagnosis not present

## 2014-01-17 DIAGNOSIS — N186 End stage renal disease: Secondary | ICD-10-CM | POA: Diagnosis not present

## 2014-01-17 DIAGNOSIS — Z452 Encounter for adjustment and management of vascular access device: Secondary | ICD-10-CM | POA: Diagnosis not present

## 2014-01-18 ENCOUNTER — Encounter (INDEPENDENT_AMBULATORY_CARE_PROVIDER_SITE_OTHER): Payer: Self-pay

## 2014-01-18 ENCOUNTER — Ambulatory Visit
Admission: RE | Admit: 2014-01-18 | Discharge: 2014-01-18 | Disposition: A | Discharge status: Home / Self Care | Payer: Medicare Other | Source: Ambulatory Visit | Attending: Urology | Admitting: Urology

## 2014-01-18 DIAGNOSIS — N289 Disorder of kidney and ureter, unspecified: Secondary | ICD-10-CM | POA: Diagnosis not present

## 2014-01-18 DIAGNOSIS — N2889 Other specified disorders of kidney and ureter: Secondary | ICD-10-CM

## 2014-01-20 ENCOUNTER — Telehealth: Payer: Self-pay | Admitting: Emergency Medicine

## 2014-01-20 NOTE — Telephone Encounter (Signed)
S/W ASHLEY, RN W/ DR WEBB, TO SEE IF PT WILL BE ABLE TO HAVE IVCM FOR CT IN 2 MOS SINCE HE HAS ONLY BEEN ON DIALYSIS FOR 4MOS. FAXED [place] NOTE FROM 01-19-14- AWAITING RESPONSE.   01-25-14- DR WEBB STATED THAT PT CAN HAVE IV DYE FOR CT JUST NOT FOR MRI.- NOTE SCANNED IN EPIC.

## 2014-02-08 DIAGNOSIS — N186 End stage renal disease: Secondary | ICD-10-CM | POA: Diagnosis not present

## 2014-02-10 ENCOUNTER — Other Ambulatory Visit (HOSPITAL_COMMUNITY): Payer: Self-pay | Admitting: Interventional Radiology

## 2014-02-10 DIAGNOSIS — D509 Iron deficiency anemia, unspecified: Secondary | ICD-10-CM | POA: Diagnosis not present

## 2014-02-10 DIAGNOSIS — N2889 Other specified disorders of kidney and ureter: Secondary | ICD-10-CM

## 2014-02-10 DIAGNOSIS — N186 End stage renal disease: Secondary | ICD-10-CM | POA: Diagnosis not present

## 2014-02-10 DIAGNOSIS — N2581 Secondary hyperparathyroidism of renal origin: Secondary | ICD-10-CM | POA: Diagnosis not present

## 2014-02-12 DIAGNOSIS — N186 End stage renal disease: Secondary | ICD-10-CM | POA: Diagnosis not present

## 2014-02-12 DIAGNOSIS — D509 Iron deficiency anemia, unspecified: Secondary | ICD-10-CM | POA: Diagnosis not present

## 2014-02-12 DIAGNOSIS — N2581 Secondary hyperparathyroidism of renal origin: Secondary | ICD-10-CM | POA: Diagnosis not present

## 2014-02-15 DIAGNOSIS — N186 End stage renal disease: Secondary | ICD-10-CM | POA: Diagnosis not present

## 2014-02-15 DIAGNOSIS — D509 Iron deficiency anemia, unspecified: Secondary | ICD-10-CM | POA: Diagnosis not present

## 2014-02-15 DIAGNOSIS — N2581 Secondary hyperparathyroidism of renal origin: Secondary | ICD-10-CM | POA: Diagnosis not present

## 2014-02-17 DIAGNOSIS — D509 Iron deficiency anemia, unspecified: Secondary | ICD-10-CM | POA: Diagnosis not present

## 2014-02-17 DIAGNOSIS — N2581 Secondary hyperparathyroidism of renal origin: Secondary | ICD-10-CM | POA: Diagnosis not present

## 2014-02-17 DIAGNOSIS — N186 End stage renal disease: Secondary | ICD-10-CM | POA: Diagnosis not present

## 2014-02-19 DIAGNOSIS — N2581 Secondary hyperparathyroidism of renal origin: Secondary | ICD-10-CM | POA: Diagnosis not present

## 2014-02-19 DIAGNOSIS — N186 End stage renal disease: Secondary | ICD-10-CM | POA: Diagnosis not present

## 2014-02-19 DIAGNOSIS — D509 Iron deficiency anemia, unspecified: Secondary | ICD-10-CM | POA: Diagnosis not present

## 2014-02-22 DIAGNOSIS — N2581 Secondary hyperparathyroidism of renal origin: Secondary | ICD-10-CM | POA: Diagnosis not present

## 2014-02-22 DIAGNOSIS — D509 Iron deficiency anemia, unspecified: Secondary | ICD-10-CM | POA: Diagnosis not present

## 2014-02-22 DIAGNOSIS — N186 End stage renal disease: Secondary | ICD-10-CM | POA: Diagnosis not present

## 2014-02-24 DIAGNOSIS — N186 End stage renal disease: Secondary | ICD-10-CM | POA: Diagnosis not present

## 2014-02-24 DIAGNOSIS — N2581 Secondary hyperparathyroidism of renal origin: Secondary | ICD-10-CM | POA: Diagnosis not present

## 2014-02-24 DIAGNOSIS — D509 Iron deficiency anemia, unspecified: Secondary | ICD-10-CM | POA: Diagnosis not present

## 2014-02-25 DIAGNOSIS — Z1211 Encounter for screening for malignant neoplasm of colon: Secondary | ICD-10-CM | POA: Diagnosis not present

## 2014-02-25 DIAGNOSIS — N186 End stage renal disease: Secondary | ICD-10-CM | POA: Diagnosis not present

## 2014-02-25 DIAGNOSIS — Z8371 Family history of colonic polyps: Secondary | ICD-10-CM | POA: Diagnosis not present

## 2014-02-26 DIAGNOSIS — N2581 Secondary hyperparathyroidism of renal origin: Secondary | ICD-10-CM | POA: Diagnosis not present

## 2014-02-26 DIAGNOSIS — N186 End stage renal disease: Secondary | ICD-10-CM | POA: Diagnosis not present

## 2014-02-26 DIAGNOSIS — D509 Iron deficiency anemia, unspecified: Secondary | ICD-10-CM | POA: Diagnosis not present

## 2014-03-01 DIAGNOSIS — D509 Iron deficiency anemia, unspecified: Secondary | ICD-10-CM | POA: Diagnosis not present

## 2014-03-01 DIAGNOSIS — N186 End stage renal disease: Secondary | ICD-10-CM | POA: Diagnosis not present

## 2014-03-01 DIAGNOSIS — N2581 Secondary hyperparathyroidism of renal origin: Secondary | ICD-10-CM | POA: Diagnosis not present

## 2014-03-03 DIAGNOSIS — N186 End stage renal disease: Secondary | ICD-10-CM | POA: Diagnosis not present

## 2014-03-03 DIAGNOSIS — N2581 Secondary hyperparathyroidism of renal origin: Secondary | ICD-10-CM | POA: Diagnosis not present

## 2014-03-03 DIAGNOSIS — D509 Iron deficiency anemia, unspecified: Secondary | ICD-10-CM | POA: Diagnosis not present

## 2014-03-05 DIAGNOSIS — N2581 Secondary hyperparathyroidism of renal origin: Secondary | ICD-10-CM | POA: Diagnosis not present

## 2014-03-05 DIAGNOSIS — N186 End stage renal disease: Secondary | ICD-10-CM | POA: Diagnosis not present

## 2014-03-05 DIAGNOSIS — D509 Iron deficiency anemia, unspecified: Secondary | ICD-10-CM | POA: Diagnosis not present

## 2014-03-08 DIAGNOSIS — N186 End stage renal disease: Secondary | ICD-10-CM | POA: Diagnosis not present

## 2014-03-08 DIAGNOSIS — N2581 Secondary hyperparathyroidism of renal origin: Secondary | ICD-10-CM | POA: Diagnosis not present

## 2014-03-08 DIAGNOSIS — D509 Iron deficiency anemia, unspecified: Secondary | ICD-10-CM | POA: Diagnosis not present

## 2014-03-10 DIAGNOSIS — D509 Iron deficiency anemia, unspecified: Secondary | ICD-10-CM | POA: Diagnosis not present

## 2014-03-10 DIAGNOSIS — N2581 Secondary hyperparathyroidism of renal origin: Secondary | ICD-10-CM | POA: Diagnosis not present

## 2014-03-10 DIAGNOSIS — N186 End stage renal disease: Secondary | ICD-10-CM | POA: Diagnosis not present

## 2014-03-11 DIAGNOSIS — N186 End stage renal disease: Secondary | ICD-10-CM | POA: Diagnosis not present

## 2014-03-12 DIAGNOSIS — N186 End stage renal disease: Secondary | ICD-10-CM | POA: Diagnosis not present

## 2014-03-12 DIAGNOSIS — N039 Chronic nephritic syndrome with unspecified morphologic changes: Secondary | ICD-10-CM | POA: Diagnosis not present

## 2014-03-12 DIAGNOSIS — N2581 Secondary hyperparathyroidism of renal origin: Secondary | ICD-10-CM | POA: Diagnosis not present

## 2014-03-12 DIAGNOSIS — D631 Anemia in chronic kidney disease: Secondary | ICD-10-CM | POA: Diagnosis not present

## 2014-03-16 ENCOUNTER — Inpatient Hospital Stay: Admission: RE | Admit: 2014-03-16 | Payer: Medicare Other | Source: Ambulatory Visit

## 2014-03-16 ENCOUNTER — Ambulatory Visit (HOSPITAL_COMMUNITY): Payer: Medicare Other

## 2014-03-25 ENCOUNTER — Ambulatory Visit (HOSPITAL_COMMUNITY): Admission: RE | Admit: 2014-03-25 | Payer: Medicare Other | Source: Ambulatory Visit

## 2014-03-29 ENCOUNTER — Other Ambulatory Visit: Payer: Medicare Other

## 2014-04-01 DIAGNOSIS — K5730 Diverticulosis of large intestine without perforation or abscess without bleeding: Secondary | ICD-10-CM | POA: Diagnosis not present

## 2014-04-01 DIAGNOSIS — K648 Other hemorrhoids: Secondary | ICD-10-CM | POA: Diagnosis not present

## 2014-04-01 DIAGNOSIS — Z800 Family history of malignant neoplasm of digestive organs: Secondary | ICD-10-CM | POA: Diagnosis not present

## 2014-04-01 DIAGNOSIS — Z1211 Encounter for screening for malignant neoplasm of colon: Secondary | ICD-10-CM | POA: Diagnosis not present

## 2014-04-11 ENCOUNTER — Encounter (HOSPITAL_COMMUNITY): Payer: Self-pay

## 2014-04-11 ENCOUNTER — Ambulatory Visit (HOSPITAL_COMMUNITY)
Admission: RE | Admit: 2014-04-11 | Discharge: 2014-04-11 | Disposition: A | Discharge status: Home / Self Care | Payer: Medicare Other | Source: Ambulatory Visit | Attending: Interventional Radiology | Admitting: Interventional Radiology

## 2014-04-11 DIAGNOSIS — N289 Disorder of kidney and ureter, unspecified: Secondary | ICD-10-CM | POA: Insufficient documentation

## 2014-04-11 DIAGNOSIS — R911 Solitary pulmonary nodule: Secondary | ICD-10-CM | POA: Insufficient documentation

## 2014-04-11 DIAGNOSIS — N186 End stage renal disease: Secondary | ICD-10-CM | POA: Diagnosis not present

## 2014-04-11 DIAGNOSIS — N2889 Other specified disorders of kidney and ureter: Secondary | ICD-10-CM

## 2014-04-11 DIAGNOSIS — N281 Cyst of kidney, acquired: Secondary | ICD-10-CM | POA: Diagnosis not present

## 2014-04-11 HISTORY — DX: Disorder of kidney and ureter, unspecified: N28.9

## 2014-04-11 MED ORDER — IOHEXOL 300 MG/ML  SOLN
125.0000 mL | Freq: Once | INTRAMUSCULAR | Status: AC | PRN
  Administered 2014-04-11: 125 mL via INTRAVENOUS

## 2014-04-12 DIAGNOSIS — D509 Iron deficiency anemia, unspecified: Secondary | ICD-10-CM | POA: Diagnosis not present

## 2014-04-12 DIAGNOSIS — N2581 Secondary hyperparathyroidism of renal origin: Secondary | ICD-10-CM | POA: Diagnosis not present

## 2014-04-12 DIAGNOSIS — D631 Anemia in chronic kidney disease: Secondary | ICD-10-CM | POA: Diagnosis not present

## 2014-04-12 DIAGNOSIS — N186 End stage renal disease: Secondary | ICD-10-CM | POA: Diagnosis not present

## 2014-04-13 ENCOUNTER — Ambulatory Visit
Admission: RE | Admit: 2014-04-13 | Discharge: 2014-04-13 | Disposition: A | Discharge status: Home / Self Care | Payer: Medicare Other | Source: Ambulatory Visit | Attending: Interventional Radiology | Admitting: Interventional Radiology

## 2014-04-13 DIAGNOSIS — R911 Solitary pulmonary nodule: Secondary | ICD-10-CM | POA: Diagnosis not present

## 2014-04-13 DIAGNOSIS — N2889 Other specified disorders of kidney and ureter: Secondary | ICD-10-CM

## 2014-04-13 DIAGNOSIS — N281 Cyst of kidney, acquired: Secondary | ICD-10-CM | POA: Diagnosis not present

## 2014-05-11 DIAGNOSIS — N186 End stage renal disease: Secondary | ICD-10-CM | POA: Diagnosis not present

## 2014-05-12 DIAGNOSIS — D631 Anemia in chronic kidney disease: Secondary | ICD-10-CM | POA: Diagnosis not present

## 2014-05-12 DIAGNOSIS — Z23 Encounter for immunization: Secondary | ICD-10-CM | POA: Diagnosis not present

## 2014-05-12 DIAGNOSIS — N186 End stage renal disease: Secondary | ICD-10-CM | POA: Diagnosis not present

## 2014-05-12 DIAGNOSIS — D509 Iron deficiency anemia, unspecified: Secondary | ICD-10-CM | POA: Diagnosis not present

## 2014-05-12 DIAGNOSIS — N2581 Secondary hyperparathyroidism of renal origin: Secondary | ICD-10-CM | POA: Diagnosis not present

## 2014-06-11 DIAGNOSIS — N186 End stage renal disease: Secondary | ICD-10-CM | POA: Diagnosis not present

## 2014-06-11 DIAGNOSIS — Z992 Dependence on renal dialysis: Secondary | ICD-10-CM | POA: Diagnosis not present

## 2014-06-14 DIAGNOSIS — D509 Iron deficiency anemia, unspecified: Secondary | ICD-10-CM | POA: Diagnosis not present

## 2014-06-14 DIAGNOSIS — D631 Anemia in chronic kidney disease: Secondary | ICD-10-CM | POA: Diagnosis not present

## 2014-06-14 DIAGNOSIS — N186 End stage renal disease: Secondary | ICD-10-CM | POA: Diagnosis not present

## 2014-06-14 DIAGNOSIS — N2581 Secondary hyperparathyroidism of renal origin: Secondary | ICD-10-CM | POA: Diagnosis not present

## 2014-07-11 DIAGNOSIS — N186 End stage renal disease: Secondary | ICD-10-CM | POA: Diagnosis not present

## 2014-07-11 DIAGNOSIS — Z992 Dependence on renal dialysis: Secondary | ICD-10-CM | POA: Diagnosis not present

## 2014-07-12 DIAGNOSIS — D509 Iron deficiency anemia, unspecified: Secondary | ICD-10-CM | POA: Diagnosis not present

## 2014-07-12 DIAGNOSIS — D631 Anemia in chronic kidney disease: Secondary | ICD-10-CM | POA: Diagnosis not present

## 2014-07-12 DIAGNOSIS — N186 End stage renal disease: Secondary | ICD-10-CM | POA: Diagnosis not present

## 2014-07-12 DIAGNOSIS — N2581 Secondary hyperparathyroidism of renal origin: Secondary | ICD-10-CM | POA: Diagnosis not present

## 2014-07-13 DIAGNOSIS — D631 Anemia in chronic kidney disease: Secondary | ICD-10-CM | POA: Diagnosis not present

## 2014-07-13 DIAGNOSIS — N2581 Secondary hyperparathyroidism of renal origin: Secondary | ICD-10-CM | POA: Diagnosis not present

## 2014-07-13 DIAGNOSIS — N186 End stage renal disease: Secondary | ICD-10-CM | POA: Diagnosis not present

## 2014-07-13 DIAGNOSIS — D509 Iron deficiency anemia, unspecified: Secondary | ICD-10-CM | POA: Diagnosis not present

## 2014-07-15 DIAGNOSIS — D631 Anemia in chronic kidney disease: Secondary | ICD-10-CM | POA: Diagnosis not present

## 2014-07-15 DIAGNOSIS — D509 Iron deficiency anemia, unspecified: Secondary | ICD-10-CM | POA: Diagnosis not present

## 2014-07-15 DIAGNOSIS — N186 End stage renal disease: Secondary | ICD-10-CM | POA: Diagnosis not present

## 2014-07-15 DIAGNOSIS — N2581 Secondary hyperparathyroidism of renal origin: Secondary | ICD-10-CM | POA: Diagnosis not present

## 2014-07-18 DIAGNOSIS — N186 End stage renal disease: Secondary | ICD-10-CM | POA: Diagnosis not present

## 2014-07-18 DIAGNOSIS — N2581 Secondary hyperparathyroidism of renal origin: Secondary | ICD-10-CM | POA: Diagnosis not present

## 2014-07-18 DIAGNOSIS — D509 Iron deficiency anemia, unspecified: Secondary | ICD-10-CM | POA: Diagnosis not present

## 2014-07-18 DIAGNOSIS — D631 Anemia in chronic kidney disease: Secondary | ICD-10-CM | POA: Diagnosis not present

## 2014-07-21 DIAGNOSIS — D631 Anemia in chronic kidney disease: Secondary | ICD-10-CM | POA: Diagnosis not present

## 2014-07-21 DIAGNOSIS — N186 End stage renal disease: Secondary | ICD-10-CM | POA: Diagnosis not present

## 2014-07-21 DIAGNOSIS — D509 Iron deficiency anemia, unspecified: Secondary | ICD-10-CM | POA: Diagnosis not present

## 2014-07-21 DIAGNOSIS — N2581 Secondary hyperparathyroidism of renal origin: Secondary | ICD-10-CM | POA: Diagnosis not present

## 2014-07-22 DIAGNOSIS — D631 Anemia in chronic kidney disease: Secondary | ICD-10-CM | POA: Diagnosis not present

## 2014-07-22 DIAGNOSIS — N2581 Secondary hyperparathyroidism of renal origin: Secondary | ICD-10-CM | POA: Diagnosis not present

## 2014-07-22 DIAGNOSIS — D509 Iron deficiency anemia, unspecified: Secondary | ICD-10-CM | POA: Diagnosis not present

## 2014-07-22 DIAGNOSIS — N186 End stage renal disease: Secondary | ICD-10-CM | POA: Diagnosis not present

## 2014-07-25 DIAGNOSIS — D631 Anemia in chronic kidney disease: Secondary | ICD-10-CM | POA: Diagnosis not present

## 2014-07-25 DIAGNOSIS — D509 Iron deficiency anemia, unspecified: Secondary | ICD-10-CM | POA: Diagnosis not present

## 2014-07-25 DIAGNOSIS — N186 End stage renal disease: Secondary | ICD-10-CM | POA: Diagnosis not present

## 2014-07-25 DIAGNOSIS — N2581 Secondary hyperparathyroidism of renal origin: Secondary | ICD-10-CM | POA: Diagnosis not present

## 2014-07-27 DIAGNOSIS — N186 End stage renal disease: Secondary | ICD-10-CM | POA: Diagnosis not present

## 2014-07-27 DIAGNOSIS — D631 Anemia in chronic kidney disease: Secondary | ICD-10-CM | POA: Diagnosis not present

## 2014-07-27 DIAGNOSIS — N2581 Secondary hyperparathyroidism of renal origin: Secondary | ICD-10-CM | POA: Diagnosis not present

## 2014-07-27 DIAGNOSIS — D509 Iron deficiency anemia, unspecified: Secondary | ICD-10-CM | POA: Diagnosis not present

## 2014-07-29 DIAGNOSIS — D509 Iron deficiency anemia, unspecified: Secondary | ICD-10-CM | POA: Diagnosis not present

## 2014-07-29 DIAGNOSIS — D631 Anemia in chronic kidney disease: Secondary | ICD-10-CM | POA: Diagnosis not present

## 2014-07-29 DIAGNOSIS — N186 End stage renal disease: Secondary | ICD-10-CM | POA: Diagnosis not present

## 2014-07-29 DIAGNOSIS — N2581 Secondary hyperparathyroidism of renal origin: Secondary | ICD-10-CM | POA: Diagnosis not present

## 2014-08-01 DIAGNOSIS — N186 End stage renal disease: Secondary | ICD-10-CM | POA: Diagnosis not present

## 2014-08-01 DIAGNOSIS — D631 Anemia in chronic kidney disease: Secondary | ICD-10-CM | POA: Diagnosis not present

## 2014-08-01 DIAGNOSIS — D509 Iron deficiency anemia, unspecified: Secondary | ICD-10-CM | POA: Diagnosis not present

## 2014-08-01 DIAGNOSIS — N2581 Secondary hyperparathyroidism of renal origin: Secondary | ICD-10-CM | POA: Diagnosis not present

## 2014-08-03 DIAGNOSIS — N2581 Secondary hyperparathyroidism of renal origin: Secondary | ICD-10-CM | POA: Diagnosis not present

## 2014-08-03 DIAGNOSIS — D509 Iron deficiency anemia, unspecified: Secondary | ICD-10-CM | POA: Diagnosis not present

## 2014-08-03 DIAGNOSIS — N186 End stage renal disease: Secondary | ICD-10-CM | POA: Diagnosis not present

## 2014-08-03 DIAGNOSIS — D631 Anemia in chronic kidney disease: Secondary | ICD-10-CM | POA: Diagnosis not present

## 2014-08-06 DIAGNOSIS — D509 Iron deficiency anemia, unspecified: Secondary | ICD-10-CM | POA: Diagnosis not present

## 2014-08-06 DIAGNOSIS — N186 End stage renal disease: Secondary | ICD-10-CM | POA: Diagnosis not present

## 2014-08-06 DIAGNOSIS — N2581 Secondary hyperparathyroidism of renal origin: Secondary | ICD-10-CM | POA: Diagnosis not present

## 2014-08-06 DIAGNOSIS — D631 Anemia in chronic kidney disease: Secondary | ICD-10-CM | POA: Diagnosis not present

## 2014-08-08 DIAGNOSIS — D509 Iron deficiency anemia, unspecified: Secondary | ICD-10-CM | POA: Diagnosis not present

## 2014-08-08 DIAGNOSIS — D631 Anemia in chronic kidney disease: Secondary | ICD-10-CM | POA: Diagnosis not present

## 2014-08-08 DIAGNOSIS — N186 End stage renal disease: Secondary | ICD-10-CM | POA: Diagnosis not present

## 2014-08-08 DIAGNOSIS — N2581 Secondary hyperparathyroidism of renal origin: Secondary | ICD-10-CM | POA: Diagnosis not present

## 2014-08-09 DIAGNOSIS — I10 Essential (primary) hypertension: Secondary | ICD-10-CM | POA: Insufficient documentation

## 2014-08-09 DIAGNOSIS — I517 Cardiomegaly: Secondary | ICD-10-CM | POA: Diagnosis not present

## 2014-08-09 DIAGNOSIS — I4581 Long QT syndrome: Secondary | ICD-10-CM | POA: Diagnosis not present

## 2014-08-10 DIAGNOSIS — D631 Anemia in chronic kidney disease: Secondary | ICD-10-CM | POA: Diagnosis not present

## 2014-08-10 DIAGNOSIS — D509 Iron deficiency anemia, unspecified: Secondary | ICD-10-CM | POA: Diagnosis not present

## 2014-08-10 DIAGNOSIS — N2581 Secondary hyperparathyroidism of renal origin: Secondary | ICD-10-CM | POA: Diagnosis not present

## 2014-08-10 DIAGNOSIS — N186 End stage renal disease: Secondary | ICD-10-CM | POA: Diagnosis not present

## 2014-08-11 DIAGNOSIS — Z992 Dependence on renal dialysis: Secondary | ICD-10-CM | POA: Diagnosis not present

## 2014-08-11 DIAGNOSIS — N186 End stage renal disease: Secondary | ICD-10-CM | POA: Diagnosis not present

## 2014-08-12 DIAGNOSIS — N186 End stage renal disease: Secondary | ICD-10-CM | POA: Diagnosis not present

## 2014-08-12 DIAGNOSIS — D631 Anemia in chronic kidney disease: Secondary | ICD-10-CM | POA: Diagnosis not present

## 2014-08-12 DIAGNOSIS — D509 Iron deficiency anemia, unspecified: Secondary | ICD-10-CM | POA: Diagnosis not present

## 2014-08-12 DIAGNOSIS — N2581 Secondary hyperparathyroidism of renal origin: Secondary | ICD-10-CM | POA: Diagnosis not present

## 2014-08-15 DIAGNOSIS — D631 Anemia in chronic kidney disease: Secondary | ICD-10-CM | POA: Diagnosis not present

## 2014-08-15 DIAGNOSIS — D509 Iron deficiency anemia, unspecified: Secondary | ICD-10-CM | POA: Diagnosis not present

## 2014-08-15 DIAGNOSIS — N2581 Secondary hyperparathyroidism of renal origin: Secondary | ICD-10-CM | POA: Diagnosis not present

## 2014-08-15 DIAGNOSIS — N186 End stage renal disease: Secondary | ICD-10-CM | POA: Diagnosis not present

## 2014-08-17 DIAGNOSIS — D631 Anemia in chronic kidney disease: Secondary | ICD-10-CM | POA: Diagnosis not present

## 2014-08-17 DIAGNOSIS — N186 End stage renal disease: Secondary | ICD-10-CM | POA: Diagnosis not present

## 2014-08-17 DIAGNOSIS — N2581 Secondary hyperparathyroidism of renal origin: Secondary | ICD-10-CM | POA: Diagnosis not present

## 2014-08-17 DIAGNOSIS — D509 Iron deficiency anemia, unspecified: Secondary | ICD-10-CM | POA: Diagnosis not present

## 2014-08-19 DIAGNOSIS — D509 Iron deficiency anemia, unspecified: Secondary | ICD-10-CM | POA: Diagnosis not present

## 2014-08-19 DIAGNOSIS — D631 Anemia in chronic kidney disease: Secondary | ICD-10-CM | POA: Diagnosis not present

## 2014-08-19 DIAGNOSIS — N2581 Secondary hyperparathyroidism of renal origin: Secondary | ICD-10-CM | POA: Diagnosis not present

## 2014-08-19 DIAGNOSIS — N186 End stage renal disease: Secondary | ICD-10-CM | POA: Diagnosis not present

## 2014-08-22 DIAGNOSIS — D631 Anemia in chronic kidney disease: Secondary | ICD-10-CM | POA: Diagnosis not present

## 2014-08-22 DIAGNOSIS — D509 Iron deficiency anemia, unspecified: Secondary | ICD-10-CM | POA: Diagnosis not present

## 2014-08-22 DIAGNOSIS — N2581 Secondary hyperparathyroidism of renal origin: Secondary | ICD-10-CM | POA: Diagnosis not present

## 2014-08-22 DIAGNOSIS — N186 End stage renal disease: Secondary | ICD-10-CM | POA: Diagnosis not present

## 2014-08-24 DIAGNOSIS — D631 Anemia in chronic kidney disease: Secondary | ICD-10-CM | POA: Diagnosis not present

## 2014-08-24 DIAGNOSIS — D509 Iron deficiency anemia, unspecified: Secondary | ICD-10-CM | POA: Diagnosis not present

## 2014-08-24 DIAGNOSIS — N186 End stage renal disease: Secondary | ICD-10-CM | POA: Diagnosis not present

## 2014-08-24 DIAGNOSIS — N2581 Secondary hyperparathyroidism of renal origin: Secondary | ICD-10-CM | POA: Diagnosis not present

## 2014-08-26 DIAGNOSIS — D509 Iron deficiency anemia, unspecified: Secondary | ICD-10-CM | POA: Diagnosis not present

## 2014-08-26 DIAGNOSIS — N186 End stage renal disease: Secondary | ICD-10-CM | POA: Diagnosis not present

## 2014-08-26 DIAGNOSIS — N2581 Secondary hyperparathyroidism of renal origin: Secondary | ICD-10-CM | POA: Diagnosis not present

## 2014-08-26 DIAGNOSIS — D631 Anemia in chronic kidney disease: Secondary | ICD-10-CM | POA: Diagnosis not present

## 2014-08-29 DIAGNOSIS — N186 End stage renal disease: Secondary | ICD-10-CM | POA: Diagnosis not present

## 2014-08-29 DIAGNOSIS — D509 Iron deficiency anemia, unspecified: Secondary | ICD-10-CM | POA: Diagnosis not present

## 2014-08-29 DIAGNOSIS — D631 Anemia in chronic kidney disease: Secondary | ICD-10-CM | POA: Diagnosis not present

## 2014-08-29 DIAGNOSIS — N2581 Secondary hyperparathyroidism of renal origin: Secondary | ICD-10-CM | POA: Diagnosis not present

## 2014-08-31 DIAGNOSIS — D631 Anemia in chronic kidney disease: Secondary | ICD-10-CM | POA: Diagnosis not present

## 2014-08-31 DIAGNOSIS — N2581 Secondary hyperparathyroidism of renal origin: Secondary | ICD-10-CM | POA: Diagnosis not present

## 2014-08-31 DIAGNOSIS — D509 Iron deficiency anemia, unspecified: Secondary | ICD-10-CM | POA: Diagnosis not present

## 2014-08-31 DIAGNOSIS — N186 End stage renal disease: Secondary | ICD-10-CM | POA: Diagnosis not present

## 2014-09-02 DIAGNOSIS — D631 Anemia in chronic kidney disease: Secondary | ICD-10-CM | POA: Diagnosis not present

## 2014-09-02 DIAGNOSIS — N2581 Secondary hyperparathyroidism of renal origin: Secondary | ICD-10-CM | POA: Diagnosis not present

## 2014-09-02 DIAGNOSIS — D509 Iron deficiency anemia, unspecified: Secondary | ICD-10-CM | POA: Diagnosis not present

## 2014-09-02 DIAGNOSIS — N186 End stage renal disease: Secondary | ICD-10-CM | POA: Diagnosis not present

## 2014-09-05 DIAGNOSIS — D631 Anemia in chronic kidney disease: Secondary | ICD-10-CM | POA: Diagnosis not present

## 2014-09-05 DIAGNOSIS — D509 Iron deficiency anemia, unspecified: Secondary | ICD-10-CM | POA: Diagnosis not present

## 2014-09-05 DIAGNOSIS — N2581 Secondary hyperparathyroidism of renal origin: Secondary | ICD-10-CM | POA: Diagnosis not present

## 2014-09-05 DIAGNOSIS — N186 End stage renal disease: Secondary | ICD-10-CM | POA: Diagnosis not present

## 2014-09-07 DIAGNOSIS — N186 End stage renal disease: Secondary | ICD-10-CM | POA: Diagnosis not present

## 2014-09-07 DIAGNOSIS — D509 Iron deficiency anemia, unspecified: Secondary | ICD-10-CM | POA: Diagnosis not present

## 2014-09-07 DIAGNOSIS — D631 Anemia in chronic kidney disease: Secondary | ICD-10-CM | POA: Diagnosis not present

## 2014-09-07 DIAGNOSIS — N2581 Secondary hyperparathyroidism of renal origin: Secondary | ICD-10-CM | POA: Diagnosis not present

## 2014-09-09 DIAGNOSIS — N2581 Secondary hyperparathyroidism of renal origin: Secondary | ICD-10-CM | POA: Diagnosis not present

## 2014-09-09 DIAGNOSIS — D631 Anemia in chronic kidney disease: Secondary | ICD-10-CM | POA: Diagnosis not present

## 2014-09-09 DIAGNOSIS — D509 Iron deficiency anemia, unspecified: Secondary | ICD-10-CM | POA: Diagnosis not present

## 2014-09-09 DIAGNOSIS — N186 End stage renal disease: Secondary | ICD-10-CM | POA: Diagnosis not present

## 2014-09-11 DIAGNOSIS — N186 End stage renal disease: Secondary | ICD-10-CM | POA: Diagnosis not present

## 2014-09-11 DIAGNOSIS — Z992 Dependence on renal dialysis: Secondary | ICD-10-CM | POA: Diagnosis not present

## 2014-09-12 DIAGNOSIS — N186 End stage renal disease: Secondary | ICD-10-CM | POA: Diagnosis not present

## 2014-09-12 DIAGNOSIS — D509 Iron deficiency anemia, unspecified: Secondary | ICD-10-CM | POA: Diagnosis not present

## 2014-09-12 DIAGNOSIS — D631 Anemia in chronic kidney disease: Secondary | ICD-10-CM | POA: Diagnosis not present

## 2014-09-12 DIAGNOSIS — N2581 Secondary hyperparathyroidism of renal origin: Secondary | ICD-10-CM | POA: Diagnosis not present

## 2014-09-14 DIAGNOSIS — N2581 Secondary hyperparathyroidism of renal origin: Secondary | ICD-10-CM | POA: Diagnosis not present

## 2014-09-14 DIAGNOSIS — D509 Iron deficiency anemia, unspecified: Secondary | ICD-10-CM | POA: Diagnosis not present

## 2014-09-14 DIAGNOSIS — D631 Anemia in chronic kidney disease: Secondary | ICD-10-CM | POA: Diagnosis not present

## 2014-09-14 DIAGNOSIS — N186 End stage renal disease: Secondary | ICD-10-CM | POA: Diagnosis not present

## 2014-09-16 DIAGNOSIS — D631 Anemia in chronic kidney disease: Secondary | ICD-10-CM | POA: Diagnosis not present

## 2014-09-16 DIAGNOSIS — N186 End stage renal disease: Secondary | ICD-10-CM | POA: Diagnosis not present

## 2014-09-16 DIAGNOSIS — D509 Iron deficiency anemia, unspecified: Secondary | ICD-10-CM | POA: Diagnosis not present

## 2014-09-16 DIAGNOSIS — N2581 Secondary hyperparathyroidism of renal origin: Secondary | ICD-10-CM | POA: Diagnosis not present

## 2014-09-19 DIAGNOSIS — N186 End stage renal disease: Secondary | ICD-10-CM | POA: Diagnosis not present

## 2014-09-19 DIAGNOSIS — N2581 Secondary hyperparathyroidism of renal origin: Secondary | ICD-10-CM | POA: Diagnosis not present

## 2014-09-19 DIAGNOSIS — D509 Iron deficiency anemia, unspecified: Secondary | ICD-10-CM | POA: Diagnosis not present

## 2014-09-19 DIAGNOSIS — D631 Anemia in chronic kidney disease: Secondary | ICD-10-CM | POA: Diagnosis not present

## 2014-09-21 DIAGNOSIS — N186 End stage renal disease: Secondary | ICD-10-CM | POA: Diagnosis not present

## 2014-09-21 DIAGNOSIS — D509 Iron deficiency anemia, unspecified: Secondary | ICD-10-CM | POA: Diagnosis not present

## 2014-09-21 DIAGNOSIS — N2581 Secondary hyperparathyroidism of renal origin: Secondary | ICD-10-CM | POA: Diagnosis not present

## 2014-09-21 DIAGNOSIS — D631 Anemia in chronic kidney disease: Secondary | ICD-10-CM | POA: Diagnosis not present

## 2014-09-23 DIAGNOSIS — D509 Iron deficiency anemia, unspecified: Secondary | ICD-10-CM | POA: Diagnosis not present

## 2014-09-23 DIAGNOSIS — N2581 Secondary hyperparathyroidism of renal origin: Secondary | ICD-10-CM | POA: Diagnosis not present

## 2014-09-23 DIAGNOSIS — N186 End stage renal disease: Secondary | ICD-10-CM | POA: Diagnosis not present

## 2014-09-23 DIAGNOSIS — D631 Anemia in chronic kidney disease: Secondary | ICD-10-CM | POA: Diagnosis not present

## 2014-09-26 DIAGNOSIS — D631 Anemia in chronic kidney disease: Secondary | ICD-10-CM | POA: Diagnosis not present

## 2014-09-26 DIAGNOSIS — D509 Iron deficiency anemia, unspecified: Secondary | ICD-10-CM | POA: Diagnosis not present

## 2014-09-26 DIAGNOSIS — N186 End stage renal disease: Secondary | ICD-10-CM | POA: Diagnosis not present

## 2014-09-26 DIAGNOSIS — N2581 Secondary hyperparathyroidism of renal origin: Secondary | ICD-10-CM | POA: Diagnosis not present

## 2014-09-28 DIAGNOSIS — D631 Anemia in chronic kidney disease: Secondary | ICD-10-CM | POA: Diagnosis not present

## 2014-09-28 DIAGNOSIS — D509 Iron deficiency anemia, unspecified: Secondary | ICD-10-CM | POA: Diagnosis not present

## 2014-09-28 DIAGNOSIS — N2581 Secondary hyperparathyroidism of renal origin: Secondary | ICD-10-CM | POA: Diagnosis not present

## 2014-09-28 DIAGNOSIS — N186 End stage renal disease: Secondary | ICD-10-CM | POA: Diagnosis not present

## 2014-09-30 DIAGNOSIS — N186 End stage renal disease: Secondary | ICD-10-CM | POA: Diagnosis not present

## 2014-09-30 DIAGNOSIS — D631 Anemia in chronic kidney disease: Secondary | ICD-10-CM | POA: Diagnosis not present

## 2014-09-30 DIAGNOSIS — D509 Iron deficiency anemia, unspecified: Secondary | ICD-10-CM | POA: Diagnosis not present

## 2014-09-30 DIAGNOSIS — N2581 Secondary hyperparathyroidism of renal origin: Secondary | ICD-10-CM | POA: Diagnosis not present

## 2014-10-03 DIAGNOSIS — D631 Anemia in chronic kidney disease: Secondary | ICD-10-CM | POA: Diagnosis not present

## 2014-10-03 DIAGNOSIS — D509 Iron deficiency anemia, unspecified: Secondary | ICD-10-CM | POA: Diagnosis not present

## 2014-10-03 DIAGNOSIS — N2581 Secondary hyperparathyroidism of renal origin: Secondary | ICD-10-CM | POA: Diagnosis not present

## 2014-10-03 DIAGNOSIS — N186 End stage renal disease: Secondary | ICD-10-CM | POA: Diagnosis not present

## 2014-10-05 DIAGNOSIS — D631 Anemia in chronic kidney disease: Secondary | ICD-10-CM | POA: Diagnosis not present

## 2014-10-05 DIAGNOSIS — D509 Iron deficiency anemia, unspecified: Secondary | ICD-10-CM | POA: Diagnosis not present

## 2014-10-05 DIAGNOSIS — N186 End stage renal disease: Secondary | ICD-10-CM | POA: Diagnosis not present

## 2014-10-05 DIAGNOSIS — N2581 Secondary hyperparathyroidism of renal origin: Secondary | ICD-10-CM | POA: Diagnosis not present

## 2014-10-07 DIAGNOSIS — N2581 Secondary hyperparathyroidism of renal origin: Secondary | ICD-10-CM | POA: Diagnosis not present

## 2014-10-07 DIAGNOSIS — D509 Iron deficiency anemia, unspecified: Secondary | ICD-10-CM | POA: Diagnosis not present

## 2014-10-07 DIAGNOSIS — N186 End stage renal disease: Secondary | ICD-10-CM | POA: Diagnosis not present

## 2014-10-07 DIAGNOSIS — D631 Anemia in chronic kidney disease: Secondary | ICD-10-CM | POA: Diagnosis not present

## 2014-10-10 DIAGNOSIS — N186 End stage renal disease: Secondary | ICD-10-CM | POA: Diagnosis not present

## 2014-10-10 DIAGNOSIS — D631 Anemia in chronic kidney disease: Secondary | ICD-10-CM | POA: Diagnosis not present

## 2014-10-10 DIAGNOSIS — D509 Iron deficiency anemia, unspecified: Secondary | ICD-10-CM | POA: Diagnosis not present

## 2014-10-10 DIAGNOSIS — Z992 Dependence on renal dialysis: Secondary | ICD-10-CM | POA: Diagnosis not present

## 2014-10-10 DIAGNOSIS — N2581 Secondary hyperparathyroidism of renal origin: Secondary | ICD-10-CM | POA: Diagnosis not present

## 2014-10-12 DIAGNOSIS — N186 End stage renal disease: Secondary | ICD-10-CM | POA: Diagnosis not present

## 2014-10-12 DIAGNOSIS — D631 Anemia in chronic kidney disease: Secondary | ICD-10-CM | POA: Diagnosis not present

## 2014-10-12 DIAGNOSIS — Z23 Encounter for immunization: Secondary | ICD-10-CM | POA: Diagnosis not present

## 2014-10-12 DIAGNOSIS — D509 Iron deficiency anemia, unspecified: Secondary | ICD-10-CM | POA: Diagnosis not present

## 2014-10-12 DIAGNOSIS — N2581 Secondary hyperparathyroidism of renal origin: Secondary | ICD-10-CM | POA: Diagnosis not present

## 2014-10-14 DIAGNOSIS — Z23 Encounter for immunization: Secondary | ICD-10-CM | POA: Diagnosis not present

## 2014-10-14 DIAGNOSIS — N186 End stage renal disease: Secondary | ICD-10-CM | POA: Diagnosis not present

## 2014-10-14 DIAGNOSIS — D631 Anemia in chronic kidney disease: Secondary | ICD-10-CM | POA: Diagnosis not present

## 2014-10-14 DIAGNOSIS — N2581 Secondary hyperparathyroidism of renal origin: Secondary | ICD-10-CM | POA: Diagnosis not present

## 2014-10-14 DIAGNOSIS — D509 Iron deficiency anemia, unspecified: Secondary | ICD-10-CM | POA: Diagnosis not present

## 2014-10-17 DIAGNOSIS — N2581 Secondary hyperparathyroidism of renal origin: Secondary | ICD-10-CM | POA: Diagnosis not present

## 2014-10-17 DIAGNOSIS — Z23 Encounter for immunization: Secondary | ICD-10-CM | POA: Diagnosis not present

## 2014-10-17 DIAGNOSIS — N186 End stage renal disease: Secondary | ICD-10-CM | POA: Diagnosis not present

## 2014-10-17 DIAGNOSIS — D509 Iron deficiency anemia, unspecified: Secondary | ICD-10-CM | POA: Diagnosis not present

## 2014-10-17 DIAGNOSIS — D631 Anemia in chronic kidney disease: Secondary | ICD-10-CM | POA: Diagnosis not present

## 2014-10-19 DIAGNOSIS — N2581 Secondary hyperparathyroidism of renal origin: Secondary | ICD-10-CM | POA: Diagnosis not present

## 2014-10-19 DIAGNOSIS — Z23 Encounter for immunization: Secondary | ICD-10-CM | POA: Diagnosis not present

## 2014-10-19 DIAGNOSIS — N186 End stage renal disease: Secondary | ICD-10-CM | POA: Diagnosis not present

## 2014-10-19 DIAGNOSIS — D631 Anemia in chronic kidney disease: Secondary | ICD-10-CM | POA: Diagnosis not present

## 2014-10-19 DIAGNOSIS — D509 Iron deficiency anemia, unspecified: Secondary | ICD-10-CM | POA: Diagnosis not present

## 2014-10-21 DIAGNOSIS — D631 Anemia in chronic kidney disease: Secondary | ICD-10-CM | POA: Diagnosis not present

## 2014-10-21 DIAGNOSIS — N2581 Secondary hyperparathyroidism of renal origin: Secondary | ICD-10-CM | POA: Diagnosis not present

## 2014-10-21 DIAGNOSIS — N186 End stage renal disease: Secondary | ICD-10-CM | POA: Diagnosis not present

## 2014-10-21 DIAGNOSIS — D509 Iron deficiency anemia, unspecified: Secondary | ICD-10-CM | POA: Diagnosis not present

## 2014-10-21 DIAGNOSIS — Z23 Encounter for immunization: Secondary | ICD-10-CM | POA: Diagnosis not present

## 2014-10-24 DIAGNOSIS — D509 Iron deficiency anemia, unspecified: Secondary | ICD-10-CM | POA: Diagnosis not present

## 2014-10-24 DIAGNOSIS — Z23 Encounter for immunization: Secondary | ICD-10-CM | POA: Diagnosis not present

## 2014-10-24 DIAGNOSIS — N186 End stage renal disease: Secondary | ICD-10-CM | POA: Diagnosis not present

## 2014-10-24 DIAGNOSIS — N2581 Secondary hyperparathyroidism of renal origin: Secondary | ICD-10-CM | POA: Diagnosis not present

## 2014-10-24 DIAGNOSIS — D631 Anemia in chronic kidney disease: Secondary | ICD-10-CM | POA: Diagnosis not present

## 2014-10-26 DIAGNOSIS — D509 Iron deficiency anemia, unspecified: Secondary | ICD-10-CM | POA: Diagnosis not present

## 2014-10-26 DIAGNOSIS — Z23 Encounter for immunization: Secondary | ICD-10-CM | POA: Diagnosis not present

## 2014-10-26 DIAGNOSIS — D631 Anemia in chronic kidney disease: Secondary | ICD-10-CM | POA: Diagnosis not present

## 2014-10-26 DIAGNOSIS — N186 End stage renal disease: Secondary | ICD-10-CM | POA: Diagnosis not present

## 2014-10-26 DIAGNOSIS — N2581 Secondary hyperparathyroidism of renal origin: Secondary | ICD-10-CM | POA: Diagnosis not present

## 2014-10-28 DIAGNOSIS — N2581 Secondary hyperparathyroidism of renal origin: Secondary | ICD-10-CM | POA: Diagnosis not present

## 2014-10-28 DIAGNOSIS — D631 Anemia in chronic kidney disease: Secondary | ICD-10-CM | POA: Diagnosis not present

## 2014-10-28 DIAGNOSIS — N186 End stage renal disease: Secondary | ICD-10-CM | POA: Diagnosis not present

## 2014-10-28 DIAGNOSIS — Z23 Encounter for immunization: Secondary | ICD-10-CM | POA: Diagnosis not present

## 2014-10-28 DIAGNOSIS — D509 Iron deficiency anemia, unspecified: Secondary | ICD-10-CM | POA: Diagnosis not present

## 2014-10-31 DIAGNOSIS — N2581 Secondary hyperparathyroidism of renal origin: Secondary | ICD-10-CM | POA: Diagnosis not present

## 2014-10-31 DIAGNOSIS — N186 End stage renal disease: Secondary | ICD-10-CM | POA: Diagnosis not present

## 2014-10-31 DIAGNOSIS — D509 Iron deficiency anemia, unspecified: Secondary | ICD-10-CM | POA: Diagnosis not present

## 2014-10-31 DIAGNOSIS — Z23 Encounter for immunization: Secondary | ICD-10-CM | POA: Diagnosis not present

## 2014-10-31 DIAGNOSIS — D631 Anemia in chronic kidney disease: Secondary | ICD-10-CM | POA: Diagnosis not present

## 2014-11-02 DIAGNOSIS — N186 End stage renal disease: Secondary | ICD-10-CM | POA: Diagnosis not present

## 2014-11-02 DIAGNOSIS — Z23 Encounter for immunization: Secondary | ICD-10-CM | POA: Diagnosis not present

## 2014-11-02 DIAGNOSIS — D509 Iron deficiency anemia, unspecified: Secondary | ICD-10-CM | POA: Diagnosis not present

## 2014-11-02 DIAGNOSIS — N2581 Secondary hyperparathyroidism of renal origin: Secondary | ICD-10-CM | POA: Diagnosis not present

## 2014-11-02 DIAGNOSIS — D631 Anemia in chronic kidney disease: Secondary | ICD-10-CM | POA: Diagnosis not present

## 2014-11-04 DIAGNOSIS — D631 Anemia in chronic kidney disease: Secondary | ICD-10-CM | POA: Diagnosis not present

## 2014-11-04 DIAGNOSIS — Z23 Encounter for immunization: Secondary | ICD-10-CM | POA: Diagnosis not present

## 2014-11-04 DIAGNOSIS — N186 End stage renal disease: Secondary | ICD-10-CM | POA: Diagnosis not present

## 2014-11-04 DIAGNOSIS — N2581 Secondary hyperparathyroidism of renal origin: Secondary | ICD-10-CM | POA: Diagnosis not present

## 2014-11-04 DIAGNOSIS — D509 Iron deficiency anemia, unspecified: Secondary | ICD-10-CM | POA: Diagnosis not present

## 2014-11-07 DIAGNOSIS — Z23 Encounter for immunization: Secondary | ICD-10-CM | POA: Diagnosis not present

## 2014-11-07 DIAGNOSIS — N2581 Secondary hyperparathyroidism of renal origin: Secondary | ICD-10-CM | POA: Diagnosis not present

## 2014-11-07 DIAGNOSIS — N186 End stage renal disease: Secondary | ICD-10-CM | POA: Diagnosis not present

## 2014-11-07 DIAGNOSIS — D509 Iron deficiency anemia, unspecified: Secondary | ICD-10-CM | POA: Diagnosis not present

## 2014-11-07 DIAGNOSIS — D631 Anemia in chronic kidney disease: Secondary | ICD-10-CM | POA: Diagnosis not present

## 2014-11-09 DIAGNOSIS — D509 Iron deficiency anemia, unspecified: Secondary | ICD-10-CM | POA: Diagnosis not present

## 2014-11-09 DIAGNOSIS — Z23 Encounter for immunization: Secondary | ICD-10-CM | POA: Diagnosis not present

## 2014-11-09 DIAGNOSIS — D631 Anemia in chronic kidney disease: Secondary | ICD-10-CM | POA: Diagnosis not present

## 2014-11-09 DIAGNOSIS — N186 End stage renal disease: Secondary | ICD-10-CM | POA: Diagnosis not present

## 2014-11-09 DIAGNOSIS — N2581 Secondary hyperparathyroidism of renal origin: Secondary | ICD-10-CM | POA: Diagnosis not present

## 2014-11-10 DIAGNOSIS — N186 End stage renal disease: Secondary | ICD-10-CM | POA: Diagnosis not present

## 2014-11-10 DIAGNOSIS — Z992 Dependence on renal dialysis: Secondary | ICD-10-CM | POA: Diagnosis not present

## 2014-11-10 DIAGNOSIS — I120 Hypertensive chronic kidney disease with stage 5 chronic kidney disease or end stage renal disease: Secondary | ICD-10-CM | POA: Diagnosis not present

## 2014-11-11 DIAGNOSIS — N186 End stage renal disease: Secondary | ICD-10-CM | POA: Diagnosis not present

## 2014-11-11 DIAGNOSIS — Z23 Encounter for immunization: Secondary | ICD-10-CM | POA: Diagnosis not present

## 2014-11-11 DIAGNOSIS — N2581 Secondary hyperparathyroidism of renal origin: Secondary | ICD-10-CM | POA: Diagnosis not present

## 2014-11-11 DIAGNOSIS — D631 Anemia in chronic kidney disease: Secondary | ICD-10-CM | POA: Diagnosis not present

## 2014-11-14 DIAGNOSIS — N186 End stage renal disease: Secondary | ICD-10-CM | POA: Diagnosis not present

## 2014-11-14 DIAGNOSIS — D631 Anemia in chronic kidney disease: Secondary | ICD-10-CM | POA: Diagnosis not present

## 2014-11-14 DIAGNOSIS — Z23 Encounter for immunization: Secondary | ICD-10-CM | POA: Diagnosis not present

## 2014-11-14 DIAGNOSIS — N2581 Secondary hyperparathyroidism of renal origin: Secondary | ICD-10-CM | POA: Diagnosis not present

## 2014-11-16 DIAGNOSIS — D631 Anemia in chronic kidney disease: Secondary | ICD-10-CM | POA: Diagnosis not present

## 2014-11-16 DIAGNOSIS — Z23 Encounter for immunization: Secondary | ICD-10-CM | POA: Diagnosis not present

## 2014-11-16 DIAGNOSIS — N186 End stage renal disease: Secondary | ICD-10-CM | POA: Diagnosis not present

## 2014-11-16 DIAGNOSIS — N2581 Secondary hyperparathyroidism of renal origin: Secondary | ICD-10-CM | POA: Diagnosis not present

## 2014-11-18 DIAGNOSIS — N2581 Secondary hyperparathyroidism of renal origin: Secondary | ICD-10-CM | POA: Diagnosis not present

## 2014-11-18 DIAGNOSIS — N186 End stage renal disease: Secondary | ICD-10-CM | POA: Diagnosis not present

## 2014-11-18 DIAGNOSIS — D631 Anemia in chronic kidney disease: Secondary | ICD-10-CM | POA: Diagnosis not present

## 2014-11-18 DIAGNOSIS — Z23 Encounter for immunization: Secondary | ICD-10-CM | POA: Diagnosis not present

## 2014-11-21 DIAGNOSIS — N2581 Secondary hyperparathyroidism of renal origin: Secondary | ICD-10-CM | POA: Diagnosis not present

## 2014-11-21 DIAGNOSIS — Z23 Encounter for immunization: Secondary | ICD-10-CM | POA: Diagnosis not present

## 2014-11-21 DIAGNOSIS — D631 Anemia in chronic kidney disease: Secondary | ICD-10-CM | POA: Diagnosis not present

## 2014-11-21 DIAGNOSIS — N186 End stage renal disease: Secondary | ICD-10-CM | POA: Diagnosis not present

## 2014-11-23 DIAGNOSIS — D631 Anemia in chronic kidney disease: Secondary | ICD-10-CM | POA: Diagnosis not present

## 2014-11-23 DIAGNOSIS — N2581 Secondary hyperparathyroidism of renal origin: Secondary | ICD-10-CM | POA: Diagnosis not present

## 2014-11-23 DIAGNOSIS — Z23 Encounter for immunization: Secondary | ICD-10-CM | POA: Diagnosis not present

## 2014-11-23 DIAGNOSIS — N186 End stage renal disease: Secondary | ICD-10-CM | POA: Diagnosis not present

## 2014-11-25 DIAGNOSIS — N186 End stage renal disease: Secondary | ICD-10-CM | POA: Diagnosis not present

## 2014-11-25 DIAGNOSIS — N2581 Secondary hyperparathyroidism of renal origin: Secondary | ICD-10-CM | POA: Diagnosis not present

## 2014-11-25 DIAGNOSIS — D631 Anemia in chronic kidney disease: Secondary | ICD-10-CM | POA: Diagnosis not present

## 2014-11-25 DIAGNOSIS — Z23 Encounter for immunization: Secondary | ICD-10-CM | POA: Diagnosis not present

## 2014-11-28 DIAGNOSIS — N186 End stage renal disease: Secondary | ICD-10-CM | POA: Diagnosis not present

## 2014-11-28 DIAGNOSIS — N2581 Secondary hyperparathyroidism of renal origin: Secondary | ICD-10-CM | POA: Diagnosis not present

## 2014-11-28 DIAGNOSIS — Z23 Encounter for immunization: Secondary | ICD-10-CM | POA: Diagnosis not present

## 2014-11-28 DIAGNOSIS — D631 Anemia in chronic kidney disease: Secondary | ICD-10-CM | POA: Diagnosis not present

## 2014-11-30 DIAGNOSIS — D631 Anemia in chronic kidney disease: Secondary | ICD-10-CM | POA: Diagnosis not present

## 2014-11-30 DIAGNOSIS — N186 End stage renal disease: Secondary | ICD-10-CM | POA: Diagnosis not present

## 2014-11-30 DIAGNOSIS — Z23 Encounter for immunization: Secondary | ICD-10-CM | POA: Diagnosis not present

## 2014-11-30 DIAGNOSIS — N2581 Secondary hyperparathyroidism of renal origin: Secondary | ICD-10-CM | POA: Diagnosis not present

## 2014-12-02 DIAGNOSIS — N186 End stage renal disease: Secondary | ICD-10-CM | POA: Diagnosis not present

## 2014-12-02 DIAGNOSIS — D631 Anemia in chronic kidney disease: Secondary | ICD-10-CM | POA: Diagnosis not present

## 2014-12-02 DIAGNOSIS — N2581 Secondary hyperparathyroidism of renal origin: Secondary | ICD-10-CM | POA: Diagnosis not present

## 2014-12-02 DIAGNOSIS — Z23 Encounter for immunization: Secondary | ICD-10-CM | POA: Diagnosis not present

## 2014-12-05 DIAGNOSIS — N186 End stage renal disease: Secondary | ICD-10-CM | POA: Diagnosis not present

## 2014-12-05 DIAGNOSIS — D631 Anemia in chronic kidney disease: Secondary | ICD-10-CM | POA: Diagnosis not present

## 2014-12-05 DIAGNOSIS — N2581 Secondary hyperparathyroidism of renal origin: Secondary | ICD-10-CM | POA: Diagnosis not present

## 2014-12-05 DIAGNOSIS — Z23 Encounter for immunization: Secondary | ICD-10-CM | POA: Diagnosis not present

## 2014-12-07 DIAGNOSIS — D631 Anemia in chronic kidney disease: Secondary | ICD-10-CM | POA: Diagnosis not present

## 2014-12-07 DIAGNOSIS — Z23 Encounter for immunization: Secondary | ICD-10-CM | POA: Diagnosis not present

## 2014-12-07 DIAGNOSIS — N2581 Secondary hyperparathyroidism of renal origin: Secondary | ICD-10-CM | POA: Diagnosis not present

## 2014-12-07 DIAGNOSIS — N186 End stage renal disease: Secondary | ICD-10-CM | POA: Diagnosis not present

## 2014-12-09 DIAGNOSIS — N2581 Secondary hyperparathyroidism of renal origin: Secondary | ICD-10-CM | POA: Diagnosis not present

## 2014-12-09 DIAGNOSIS — N186 End stage renal disease: Secondary | ICD-10-CM | POA: Diagnosis not present

## 2014-12-09 DIAGNOSIS — Z23 Encounter for immunization: Secondary | ICD-10-CM | POA: Diagnosis not present

## 2014-12-09 DIAGNOSIS — D631 Anemia in chronic kidney disease: Secondary | ICD-10-CM | POA: Diagnosis not present

## 2014-12-10 DIAGNOSIS — N186 End stage renal disease: Secondary | ICD-10-CM | POA: Diagnosis not present

## 2014-12-10 DIAGNOSIS — I120 Hypertensive chronic kidney disease with stage 5 chronic kidney disease or end stage renal disease: Secondary | ICD-10-CM | POA: Diagnosis not present

## 2014-12-10 DIAGNOSIS — Z992 Dependence on renal dialysis: Secondary | ICD-10-CM | POA: Diagnosis not present

## 2014-12-12 DIAGNOSIS — N186 End stage renal disease: Secondary | ICD-10-CM | POA: Diagnosis not present

## 2014-12-12 DIAGNOSIS — D509 Iron deficiency anemia, unspecified: Secondary | ICD-10-CM | POA: Diagnosis not present

## 2014-12-12 DIAGNOSIS — N2581 Secondary hyperparathyroidism of renal origin: Secondary | ICD-10-CM | POA: Diagnosis not present

## 2014-12-12 DIAGNOSIS — Z23 Encounter for immunization: Secondary | ICD-10-CM | POA: Diagnosis not present

## 2014-12-12 DIAGNOSIS — D631 Anemia in chronic kidney disease: Secondary | ICD-10-CM | POA: Diagnosis not present

## 2014-12-14 DIAGNOSIS — Z23 Encounter for immunization: Secondary | ICD-10-CM | POA: Diagnosis not present

## 2014-12-14 DIAGNOSIS — D509 Iron deficiency anemia, unspecified: Secondary | ICD-10-CM | POA: Diagnosis not present

## 2014-12-14 DIAGNOSIS — N2581 Secondary hyperparathyroidism of renal origin: Secondary | ICD-10-CM | POA: Diagnosis not present

## 2014-12-14 DIAGNOSIS — N186 End stage renal disease: Secondary | ICD-10-CM | POA: Diagnosis not present

## 2014-12-14 DIAGNOSIS — D631 Anemia in chronic kidney disease: Secondary | ICD-10-CM | POA: Diagnosis not present

## 2014-12-16 DIAGNOSIS — N186 End stage renal disease: Secondary | ICD-10-CM | POA: Diagnosis not present

## 2014-12-16 DIAGNOSIS — N2581 Secondary hyperparathyroidism of renal origin: Secondary | ICD-10-CM | POA: Diagnosis not present

## 2014-12-16 DIAGNOSIS — D631 Anemia in chronic kidney disease: Secondary | ICD-10-CM | POA: Diagnosis not present

## 2014-12-16 DIAGNOSIS — D509 Iron deficiency anemia, unspecified: Secondary | ICD-10-CM | POA: Diagnosis not present

## 2014-12-16 DIAGNOSIS — Z23 Encounter for immunization: Secondary | ICD-10-CM | POA: Diagnosis not present

## 2014-12-19 DIAGNOSIS — Z23 Encounter for immunization: Secondary | ICD-10-CM | POA: Diagnosis not present

## 2014-12-19 DIAGNOSIS — D631 Anemia in chronic kidney disease: Secondary | ICD-10-CM | POA: Diagnosis not present

## 2014-12-19 DIAGNOSIS — N2581 Secondary hyperparathyroidism of renal origin: Secondary | ICD-10-CM | POA: Diagnosis not present

## 2014-12-19 DIAGNOSIS — D509 Iron deficiency anemia, unspecified: Secondary | ICD-10-CM | POA: Diagnosis not present

## 2014-12-19 DIAGNOSIS — N186 End stage renal disease: Secondary | ICD-10-CM | POA: Diagnosis not present

## 2014-12-21 DIAGNOSIS — D509 Iron deficiency anemia, unspecified: Secondary | ICD-10-CM | POA: Diagnosis not present

## 2014-12-21 DIAGNOSIS — Z23 Encounter for immunization: Secondary | ICD-10-CM | POA: Diagnosis not present

## 2014-12-21 DIAGNOSIS — N186 End stage renal disease: Secondary | ICD-10-CM | POA: Diagnosis not present

## 2014-12-21 DIAGNOSIS — D631 Anemia in chronic kidney disease: Secondary | ICD-10-CM | POA: Diagnosis not present

## 2014-12-21 DIAGNOSIS — N2581 Secondary hyperparathyroidism of renal origin: Secondary | ICD-10-CM | POA: Diagnosis not present

## 2014-12-23 DIAGNOSIS — D631 Anemia in chronic kidney disease: Secondary | ICD-10-CM | POA: Diagnosis not present

## 2014-12-23 DIAGNOSIS — N186 End stage renal disease: Secondary | ICD-10-CM | POA: Diagnosis not present

## 2014-12-23 DIAGNOSIS — Z23 Encounter for immunization: Secondary | ICD-10-CM | POA: Diagnosis not present

## 2014-12-23 DIAGNOSIS — N2581 Secondary hyperparathyroidism of renal origin: Secondary | ICD-10-CM | POA: Diagnosis not present

## 2014-12-23 DIAGNOSIS — D509 Iron deficiency anemia, unspecified: Secondary | ICD-10-CM | POA: Diagnosis not present

## 2014-12-26 DIAGNOSIS — N186 End stage renal disease: Secondary | ICD-10-CM | POA: Diagnosis not present

## 2014-12-26 DIAGNOSIS — N2581 Secondary hyperparathyroidism of renal origin: Secondary | ICD-10-CM | POA: Diagnosis not present

## 2014-12-26 DIAGNOSIS — D631 Anemia in chronic kidney disease: Secondary | ICD-10-CM | POA: Diagnosis not present

## 2014-12-26 DIAGNOSIS — Z23 Encounter for immunization: Secondary | ICD-10-CM | POA: Diagnosis not present

## 2014-12-26 DIAGNOSIS — D509 Iron deficiency anemia, unspecified: Secondary | ICD-10-CM | POA: Diagnosis not present

## 2014-12-28 DIAGNOSIS — N186 End stage renal disease: Secondary | ICD-10-CM | POA: Diagnosis not present

## 2014-12-28 DIAGNOSIS — N2581 Secondary hyperparathyroidism of renal origin: Secondary | ICD-10-CM | POA: Diagnosis not present

## 2014-12-28 DIAGNOSIS — D631 Anemia in chronic kidney disease: Secondary | ICD-10-CM | POA: Diagnosis not present

## 2014-12-28 DIAGNOSIS — D509 Iron deficiency anemia, unspecified: Secondary | ICD-10-CM | POA: Diagnosis not present

## 2014-12-28 DIAGNOSIS — Z23 Encounter for immunization: Secondary | ICD-10-CM | POA: Diagnosis not present

## 2014-12-30 DIAGNOSIS — D509 Iron deficiency anemia, unspecified: Secondary | ICD-10-CM | POA: Diagnosis not present

## 2014-12-30 DIAGNOSIS — N186 End stage renal disease: Secondary | ICD-10-CM | POA: Diagnosis not present

## 2014-12-30 DIAGNOSIS — Z23 Encounter for immunization: Secondary | ICD-10-CM | POA: Diagnosis not present

## 2014-12-30 DIAGNOSIS — D631 Anemia in chronic kidney disease: Secondary | ICD-10-CM | POA: Diagnosis not present

## 2014-12-30 DIAGNOSIS — N2581 Secondary hyperparathyroidism of renal origin: Secondary | ICD-10-CM | POA: Diagnosis not present

## 2015-01-02 DIAGNOSIS — D631 Anemia in chronic kidney disease: Secondary | ICD-10-CM | POA: Diagnosis not present

## 2015-01-02 DIAGNOSIS — D509 Iron deficiency anemia, unspecified: Secondary | ICD-10-CM | POA: Diagnosis not present

## 2015-01-02 DIAGNOSIS — N186 End stage renal disease: Secondary | ICD-10-CM | POA: Diagnosis not present

## 2015-01-02 DIAGNOSIS — N2581 Secondary hyperparathyroidism of renal origin: Secondary | ICD-10-CM | POA: Diagnosis not present

## 2015-01-02 DIAGNOSIS — Z23 Encounter for immunization: Secondary | ICD-10-CM | POA: Diagnosis not present

## 2015-01-04 DIAGNOSIS — Z23 Encounter for immunization: Secondary | ICD-10-CM | POA: Diagnosis not present

## 2015-01-04 DIAGNOSIS — N2581 Secondary hyperparathyroidism of renal origin: Secondary | ICD-10-CM | POA: Diagnosis not present

## 2015-01-04 DIAGNOSIS — D509 Iron deficiency anemia, unspecified: Secondary | ICD-10-CM | POA: Diagnosis not present

## 2015-01-04 DIAGNOSIS — D631 Anemia in chronic kidney disease: Secondary | ICD-10-CM | POA: Diagnosis not present

## 2015-01-04 DIAGNOSIS — N186 End stage renal disease: Secondary | ICD-10-CM | POA: Diagnosis not present

## 2015-01-06 DIAGNOSIS — N186 End stage renal disease: Secondary | ICD-10-CM | POA: Diagnosis not present

## 2015-01-06 DIAGNOSIS — D631 Anemia in chronic kidney disease: Secondary | ICD-10-CM | POA: Diagnosis not present

## 2015-01-06 DIAGNOSIS — D509 Iron deficiency anemia, unspecified: Secondary | ICD-10-CM | POA: Diagnosis not present

## 2015-01-06 DIAGNOSIS — Z23 Encounter for immunization: Secondary | ICD-10-CM | POA: Diagnosis not present

## 2015-01-06 DIAGNOSIS — N2581 Secondary hyperparathyroidism of renal origin: Secondary | ICD-10-CM | POA: Diagnosis not present

## 2015-01-09 DIAGNOSIS — N186 End stage renal disease: Secondary | ICD-10-CM | POA: Diagnosis not present

## 2015-01-09 DIAGNOSIS — Z23 Encounter for immunization: Secondary | ICD-10-CM | POA: Diagnosis not present

## 2015-01-09 DIAGNOSIS — D509 Iron deficiency anemia, unspecified: Secondary | ICD-10-CM | POA: Diagnosis not present

## 2015-01-09 DIAGNOSIS — N2581 Secondary hyperparathyroidism of renal origin: Secondary | ICD-10-CM | POA: Diagnosis not present

## 2015-01-09 DIAGNOSIS — D631 Anemia in chronic kidney disease: Secondary | ICD-10-CM | POA: Diagnosis not present

## 2015-01-10 DIAGNOSIS — I120 Hypertensive chronic kidney disease with stage 5 chronic kidney disease or end stage renal disease: Secondary | ICD-10-CM | POA: Diagnosis not present

## 2015-01-10 DIAGNOSIS — Z992 Dependence on renal dialysis: Secondary | ICD-10-CM | POA: Diagnosis not present

## 2015-01-10 DIAGNOSIS — N186 End stage renal disease: Secondary | ICD-10-CM | POA: Diagnosis not present

## 2015-01-11 DIAGNOSIS — N186 End stage renal disease: Secondary | ICD-10-CM | POA: Diagnosis not present

## 2015-01-11 DIAGNOSIS — N2581 Secondary hyperparathyroidism of renal origin: Secondary | ICD-10-CM | POA: Diagnosis not present

## 2015-01-11 DIAGNOSIS — D509 Iron deficiency anemia, unspecified: Secondary | ICD-10-CM | POA: Diagnosis not present

## 2015-01-11 DIAGNOSIS — D631 Anemia in chronic kidney disease: Secondary | ICD-10-CM | POA: Diagnosis not present

## 2015-01-13 DIAGNOSIS — N2581 Secondary hyperparathyroidism of renal origin: Secondary | ICD-10-CM | POA: Diagnosis not present

## 2015-01-13 DIAGNOSIS — D631 Anemia in chronic kidney disease: Secondary | ICD-10-CM | POA: Diagnosis not present

## 2015-01-13 DIAGNOSIS — D509 Iron deficiency anemia, unspecified: Secondary | ICD-10-CM | POA: Diagnosis not present

## 2015-01-13 DIAGNOSIS — N186 End stage renal disease: Secondary | ICD-10-CM | POA: Diagnosis not present

## 2015-01-16 DIAGNOSIS — N2581 Secondary hyperparathyroidism of renal origin: Secondary | ICD-10-CM | POA: Diagnosis not present

## 2015-01-16 DIAGNOSIS — D631 Anemia in chronic kidney disease: Secondary | ICD-10-CM | POA: Diagnosis not present

## 2015-01-16 DIAGNOSIS — N186 End stage renal disease: Secondary | ICD-10-CM | POA: Diagnosis not present

## 2015-01-16 DIAGNOSIS — D509 Iron deficiency anemia, unspecified: Secondary | ICD-10-CM | POA: Diagnosis not present

## 2015-01-18 DIAGNOSIS — N186 End stage renal disease: Secondary | ICD-10-CM | POA: Diagnosis not present

## 2015-01-18 DIAGNOSIS — N2581 Secondary hyperparathyroidism of renal origin: Secondary | ICD-10-CM | POA: Diagnosis not present

## 2015-01-18 DIAGNOSIS — D509 Iron deficiency anemia, unspecified: Secondary | ICD-10-CM | POA: Diagnosis not present

## 2015-01-18 DIAGNOSIS — D631 Anemia in chronic kidney disease: Secondary | ICD-10-CM | POA: Diagnosis not present

## 2015-01-20 DIAGNOSIS — D509 Iron deficiency anemia, unspecified: Secondary | ICD-10-CM | POA: Diagnosis not present

## 2015-01-20 DIAGNOSIS — N2581 Secondary hyperparathyroidism of renal origin: Secondary | ICD-10-CM | POA: Diagnosis not present

## 2015-01-20 DIAGNOSIS — N186 End stage renal disease: Secondary | ICD-10-CM | POA: Diagnosis not present

## 2015-01-20 DIAGNOSIS — D631 Anemia in chronic kidney disease: Secondary | ICD-10-CM | POA: Diagnosis not present

## 2015-01-23 DIAGNOSIS — N2581 Secondary hyperparathyroidism of renal origin: Secondary | ICD-10-CM | POA: Diagnosis not present

## 2015-01-23 DIAGNOSIS — D631 Anemia in chronic kidney disease: Secondary | ICD-10-CM | POA: Diagnosis not present

## 2015-01-23 DIAGNOSIS — N186 End stage renal disease: Secondary | ICD-10-CM | POA: Diagnosis not present

## 2015-01-23 DIAGNOSIS — D509 Iron deficiency anemia, unspecified: Secondary | ICD-10-CM | POA: Diagnosis not present

## 2015-01-25 DIAGNOSIS — N186 End stage renal disease: Secondary | ICD-10-CM | POA: Diagnosis not present

## 2015-01-25 DIAGNOSIS — D509 Iron deficiency anemia, unspecified: Secondary | ICD-10-CM | POA: Diagnosis not present

## 2015-01-25 DIAGNOSIS — D631 Anemia in chronic kidney disease: Secondary | ICD-10-CM | POA: Diagnosis not present

## 2015-01-25 DIAGNOSIS — N2581 Secondary hyperparathyroidism of renal origin: Secondary | ICD-10-CM | POA: Diagnosis not present

## 2015-01-27 DIAGNOSIS — D509 Iron deficiency anemia, unspecified: Secondary | ICD-10-CM | POA: Diagnosis not present

## 2015-01-27 DIAGNOSIS — N2581 Secondary hyperparathyroidism of renal origin: Secondary | ICD-10-CM | POA: Diagnosis not present

## 2015-01-27 DIAGNOSIS — D631 Anemia in chronic kidney disease: Secondary | ICD-10-CM | POA: Diagnosis not present

## 2015-01-27 DIAGNOSIS — N186 End stage renal disease: Secondary | ICD-10-CM | POA: Diagnosis not present

## 2015-01-30 DIAGNOSIS — N2581 Secondary hyperparathyroidism of renal origin: Secondary | ICD-10-CM | POA: Diagnosis not present

## 2015-01-30 DIAGNOSIS — D631 Anemia in chronic kidney disease: Secondary | ICD-10-CM | POA: Diagnosis not present

## 2015-01-30 DIAGNOSIS — D509 Iron deficiency anemia, unspecified: Secondary | ICD-10-CM | POA: Diagnosis not present

## 2015-01-30 DIAGNOSIS — N186 End stage renal disease: Secondary | ICD-10-CM | POA: Diagnosis not present

## 2015-02-01 DIAGNOSIS — D509 Iron deficiency anemia, unspecified: Secondary | ICD-10-CM | POA: Diagnosis not present

## 2015-02-01 DIAGNOSIS — D631 Anemia in chronic kidney disease: Secondary | ICD-10-CM | POA: Diagnosis not present

## 2015-02-01 DIAGNOSIS — N186 End stage renal disease: Secondary | ICD-10-CM | POA: Diagnosis not present

## 2015-02-01 DIAGNOSIS — N2581 Secondary hyperparathyroidism of renal origin: Secondary | ICD-10-CM | POA: Diagnosis not present

## 2015-02-03 DIAGNOSIS — D509 Iron deficiency anemia, unspecified: Secondary | ICD-10-CM | POA: Diagnosis not present

## 2015-02-03 DIAGNOSIS — D631 Anemia in chronic kidney disease: Secondary | ICD-10-CM | POA: Diagnosis not present

## 2015-02-03 DIAGNOSIS — N2581 Secondary hyperparathyroidism of renal origin: Secondary | ICD-10-CM | POA: Diagnosis not present

## 2015-02-03 DIAGNOSIS — N186 End stage renal disease: Secondary | ICD-10-CM | POA: Diagnosis not present

## 2015-02-06 DIAGNOSIS — N186 End stage renal disease: Secondary | ICD-10-CM | POA: Diagnosis not present

## 2015-02-06 DIAGNOSIS — D509 Iron deficiency anemia, unspecified: Secondary | ICD-10-CM | POA: Diagnosis not present

## 2015-02-06 DIAGNOSIS — N2581 Secondary hyperparathyroidism of renal origin: Secondary | ICD-10-CM | POA: Diagnosis not present

## 2015-02-06 DIAGNOSIS — D631 Anemia in chronic kidney disease: Secondary | ICD-10-CM | POA: Diagnosis not present

## 2015-02-08 DIAGNOSIS — D509 Iron deficiency anemia, unspecified: Secondary | ICD-10-CM | POA: Diagnosis not present

## 2015-02-08 DIAGNOSIS — N186 End stage renal disease: Secondary | ICD-10-CM | POA: Diagnosis not present

## 2015-02-08 DIAGNOSIS — N2581 Secondary hyperparathyroidism of renal origin: Secondary | ICD-10-CM | POA: Diagnosis not present

## 2015-02-08 DIAGNOSIS — D631 Anemia in chronic kidney disease: Secondary | ICD-10-CM | POA: Diagnosis not present

## 2015-02-09 DIAGNOSIS — Z992 Dependence on renal dialysis: Secondary | ICD-10-CM | POA: Diagnosis not present

## 2015-02-09 DIAGNOSIS — N186 End stage renal disease: Secondary | ICD-10-CM | POA: Diagnosis not present

## 2015-02-09 DIAGNOSIS — I120 Hypertensive chronic kidney disease with stage 5 chronic kidney disease or end stage renal disease: Secondary | ICD-10-CM | POA: Diagnosis not present

## 2015-02-10 DIAGNOSIS — D509 Iron deficiency anemia, unspecified: Secondary | ICD-10-CM | POA: Diagnosis not present

## 2015-02-10 DIAGNOSIS — N186 End stage renal disease: Secondary | ICD-10-CM | POA: Diagnosis not present

## 2015-02-10 DIAGNOSIS — D631 Anemia in chronic kidney disease: Secondary | ICD-10-CM | POA: Diagnosis not present

## 2015-02-10 DIAGNOSIS — N2581 Secondary hyperparathyroidism of renal origin: Secondary | ICD-10-CM | POA: Diagnosis not present

## 2015-02-13 DIAGNOSIS — N2581 Secondary hyperparathyroidism of renal origin: Secondary | ICD-10-CM | POA: Diagnosis not present

## 2015-02-13 DIAGNOSIS — D631 Anemia in chronic kidney disease: Secondary | ICD-10-CM | POA: Diagnosis not present

## 2015-02-13 DIAGNOSIS — N186 End stage renal disease: Secondary | ICD-10-CM | POA: Diagnosis not present

## 2015-02-13 DIAGNOSIS — D509 Iron deficiency anemia, unspecified: Secondary | ICD-10-CM | POA: Diagnosis not present

## 2015-02-15 DIAGNOSIS — D631 Anemia in chronic kidney disease: Secondary | ICD-10-CM | POA: Diagnosis not present

## 2015-02-15 DIAGNOSIS — N186 End stage renal disease: Secondary | ICD-10-CM | POA: Diagnosis not present

## 2015-02-15 DIAGNOSIS — N2581 Secondary hyperparathyroidism of renal origin: Secondary | ICD-10-CM | POA: Diagnosis not present

## 2015-02-15 DIAGNOSIS — D509 Iron deficiency anemia, unspecified: Secondary | ICD-10-CM | POA: Diagnosis not present

## 2015-02-17 DIAGNOSIS — D509 Iron deficiency anemia, unspecified: Secondary | ICD-10-CM | POA: Diagnosis not present

## 2015-02-17 DIAGNOSIS — D631 Anemia in chronic kidney disease: Secondary | ICD-10-CM | POA: Diagnosis not present

## 2015-02-17 DIAGNOSIS — N186 End stage renal disease: Secondary | ICD-10-CM | POA: Diagnosis not present

## 2015-02-17 DIAGNOSIS — N2581 Secondary hyperparathyroidism of renal origin: Secondary | ICD-10-CM | POA: Diagnosis not present

## 2015-02-20 DIAGNOSIS — N186 End stage renal disease: Secondary | ICD-10-CM | POA: Diagnosis not present

## 2015-02-20 DIAGNOSIS — N2581 Secondary hyperparathyroidism of renal origin: Secondary | ICD-10-CM | POA: Diagnosis not present

## 2015-02-20 DIAGNOSIS — D509 Iron deficiency anemia, unspecified: Secondary | ICD-10-CM | POA: Diagnosis not present

## 2015-02-20 DIAGNOSIS — D631 Anemia in chronic kidney disease: Secondary | ICD-10-CM | POA: Diagnosis not present

## 2015-02-22 DIAGNOSIS — D509 Iron deficiency anemia, unspecified: Secondary | ICD-10-CM | POA: Diagnosis not present

## 2015-02-22 DIAGNOSIS — D631 Anemia in chronic kidney disease: Secondary | ICD-10-CM | POA: Diagnosis not present

## 2015-02-22 DIAGNOSIS — N186 End stage renal disease: Secondary | ICD-10-CM | POA: Diagnosis not present

## 2015-02-22 DIAGNOSIS — N2581 Secondary hyperparathyroidism of renal origin: Secondary | ICD-10-CM | POA: Diagnosis not present

## 2015-02-24 DIAGNOSIS — D509 Iron deficiency anemia, unspecified: Secondary | ICD-10-CM | POA: Diagnosis not present

## 2015-02-24 DIAGNOSIS — D631 Anemia in chronic kidney disease: Secondary | ICD-10-CM | POA: Diagnosis not present

## 2015-02-24 DIAGNOSIS — N186 End stage renal disease: Secondary | ICD-10-CM | POA: Diagnosis not present

## 2015-02-24 DIAGNOSIS — N2581 Secondary hyperparathyroidism of renal origin: Secondary | ICD-10-CM | POA: Diagnosis not present

## 2015-02-27 DIAGNOSIS — N186 End stage renal disease: Secondary | ICD-10-CM | POA: Diagnosis not present

## 2015-02-27 DIAGNOSIS — D509 Iron deficiency anemia, unspecified: Secondary | ICD-10-CM | POA: Diagnosis not present

## 2015-02-27 DIAGNOSIS — N2581 Secondary hyperparathyroidism of renal origin: Secondary | ICD-10-CM | POA: Diagnosis not present

## 2015-02-27 DIAGNOSIS — D631 Anemia in chronic kidney disease: Secondary | ICD-10-CM | POA: Diagnosis not present

## 2015-03-01 DIAGNOSIS — D631 Anemia in chronic kidney disease: Secondary | ICD-10-CM | POA: Diagnosis not present

## 2015-03-01 DIAGNOSIS — N186 End stage renal disease: Secondary | ICD-10-CM | POA: Diagnosis not present

## 2015-03-01 DIAGNOSIS — D509 Iron deficiency anemia, unspecified: Secondary | ICD-10-CM | POA: Diagnosis not present

## 2015-03-01 DIAGNOSIS — N2581 Secondary hyperparathyroidism of renal origin: Secondary | ICD-10-CM | POA: Diagnosis not present

## 2015-03-03 DIAGNOSIS — D631 Anemia in chronic kidney disease: Secondary | ICD-10-CM | POA: Diagnosis not present

## 2015-03-03 DIAGNOSIS — N186 End stage renal disease: Secondary | ICD-10-CM | POA: Diagnosis not present

## 2015-03-03 DIAGNOSIS — N2581 Secondary hyperparathyroidism of renal origin: Secondary | ICD-10-CM | POA: Diagnosis not present

## 2015-03-03 DIAGNOSIS — D509 Iron deficiency anemia, unspecified: Secondary | ICD-10-CM | POA: Diagnosis not present

## 2015-03-06 DIAGNOSIS — N186 End stage renal disease: Secondary | ICD-10-CM | POA: Diagnosis not present

## 2015-03-06 DIAGNOSIS — D631 Anemia in chronic kidney disease: Secondary | ICD-10-CM | POA: Diagnosis not present

## 2015-03-06 DIAGNOSIS — N2581 Secondary hyperparathyroidism of renal origin: Secondary | ICD-10-CM | POA: Diagnosis not present

## 2015-03-06 DIAGNOSIS — D509 Iron deficiency anemia, unspecified: Secondary | ICD-10-CM | POA: Diagnosis not present

## 2015-03-08 DIAGNOSIS — D509 Iron deficiency anemia, unspecified: Secondary | ICD-10-CM | POA: Diagnosis not present

## 2015-03-08 DIAGNOSIS — N2581 Secondary hyperparathyroidism of renal origin: Secondary | ICD-10-CM | POA: Diagnosis not present

## 2015-03-08 DIAGNOSIS — N186 End stage renal disease: Secondary | ICD-10-CM | POA: Diagnosis not present

## 2015-03-08 DIAGNOSIS — D631 Anemia in chronic kidney disease: Secondary | ICD-10-CM | POA: Diagnosis not present

## 2015-03-10 DIAGNOSIS — N186 End stage renal disease: Secondary | ICD-10-CM | POA: Diagnosis not present

## 2015-03-10 DIAGNOSIS — D509 Iron deficiency anemia, unspecified: Secondary | ICD-10-CM | POA: Diagnosis not present

## 2015-03-10 DIAGNOSIS — N2581 Secondary hyperparathyroidism of renal origin: Secondary | ICD-10-CM | POA: Diagnosis not present

## 2015-03-10 DIAGNOSIS — D631 Anemia in chronic kidney disease: Secondary | ICD-10-CM | POA: Diagnosis not present

## 2015-03-12 DIAGNOSIS — I120 Hypertensive chronic kidney disease with stage 5 chronic kidney disease or end stage renal disease: Secondary | ICD-10-CM | POA: Diagnosis not present

## 2015-03-12 DIAGNOSIS — N186 End stage renal disease: Secondary | ICD-10-CM | POA: Diagnosis not present

## 2015-03-12 DIAGNOSIS — Z992 Dependence on renal dialysis: Secondary | ICD-10-CM | POA: Diagnosis not present

## 2015-03-13 DIAGNOSIS — N2581 Secondary hyperparathyroidism of renal origin: Secondary | ICD-10-CM | POA: Diagnosis not present

## 2015-03-13 DIAGNOSIS — D631 Anemia in chronic kidney disease: Secondary | ICD-10-CM | POA: Diagnosis not present

## 2015-03-13 DIAGNOSIS — D509 Iron deficiency anemia, unspecified: Secondary | ICD-10-CM | POA: Diagnosis not present

## 2015-03-13 DIAGNOSIS — N186 End stage renal disease: Secondary | ICD-10-CM | POA: Diagnosis not present

## 2015-03-15 DIAGNOSIS — D509 Iron deficiency anemia, unspecified: Secondary | ICD-10-CM | POA: Diagnosis not present

## 2015-03-15 DIAGNOSIS — N2581 Secondary hyperparathyroidism of renal origin: Secondary | ICD-10-CM | POA: Diagnosis not present

## 2015-03-15 DIAGNOSIS — N186 End stage renal disease: Secondary | ICD-10-CM | POA: Diagnosis not present

## 2015-03-15 DIAGNOSIS — D631 Anemia in chronic kidney disease: Secondary | ICD-10-CM | POA: Diagnosis not present

## 2015-03-17 DIAGNOSIS — N186 End stage renal disease: Secondary | ICD-10-CM | POA: Diagnosis not present

## 2015-03-17 DIAGNOSIS — D631 Anemia in chronic kidney disease: Secondary | ICD-10-CM | POA: Diagnosis not present

## 2015-03-17 DIAGNOSIS — N2581 Secondary hyperparathyroidism of renal origin: Secondary | ICD-10-CM | POA: Diagnosis not present

## 2015-03-17 DIAGNOSIS — D509 Iron deficiency anemia, unspecified: Secondary | ICD-10-CM | POA: Diagnosis not present

## 2015-03-20 DIAGNOSIS — N2581 Secondary hyperparathyroidism of renal origin: Secondary | ICD-10-CM | POA: Diagnosis not present

## 2015-03-20 DIAGNOSIS — D509 Iron deficiency anemia, unspecified: Secondary | ICD-10-CM | POA: Diagnosis not present

## 2015-03-20 DIAGNOSIS — D631 Anemia in chronic kidney disease: Secondary | ICD-10-CM | POA: Diagnosis not present

## 2015-03-20 DIAGNOSIS — N186 End stage renal disease: Secondary | ICD-10-CM | POA: Diagnosis not present

## 2015-03-22 DIAGNOSIS — N2581 Secondary hyperparathyroidism of renal origin: Secondary | ICD-10-CM | POA: Diagnosis not present

## 2015-03-22 DIAGNOSIS — D509 Iron deficiency anemia, unspecified: Secondary | ICD-10-CM | POA: Diagnosis not present

## 2015-03-22 DIAGNOSIS — D631 Anemia in chronic kidney disease: Secondary | ICD-10-CM | POA: Diagnosis not present

## 2015-03-22 DIAGNOSIS — N186 End stage renal disease: Secondary | ICD-10-CM | POA: Diagnosis not present

## 2015-03-24 DIAGNOSIS — D631 Anemia in chronic kidney disease: Secondary | ICD-10-CM | POA: Diagnosis not present

## 2015-03-24 DIAGNOSIS — N186 End stage renal disease: Secondary | ICD-10-CM | POA: Diagnosis not present

## 2015-03-24 DIAGNOSIS — D509 Iron deficiency anemia, unspecified: Secondary | ICD-10-CM | POA: Diagnosis not present

## 2015-03-24 DIAGNOSIS — N2581 Secondary hyperparathyroidism of renal origin: Secondary | ICD-10-CM | POA: Diagnosis not present

## 2015-03-27 DIAGNOSIS — N2581 Secondary hyperparathyroidism of renal origin: Secondary | ICD-10-CM | POA: Diagnosis not present

## 2015-03-27 DIAGNOSIS — D509 Iron deficiency anemia, unspecified: Secondary | ICD-10-CM | POA: Diagnosis not present

## 2015-03-27 DIAGNOSIS — D631 Anemia in chronic kidney disease: Secondary | ICD-10-CM | POA: Diagnosis not present

## 2015-03-27 DIAGNOSIS — N186 End stage renal disease: Secondary | ICD-10-CM | POA: Diagnosis not present

## 2015-03-29 DIAGNOSIS — D509 Iron deficiency anemia, unspecified: Secondary | ICD-10-CM | POA: Diagnosis not present

## 2015-03-29 DIAGNOSIS — N186 End stage renal disease: Secondary | ICD-10-CM | POA: Diagnosis not present

## 2015-03-29 DIAGNOSIS — D631 Anemia in chronic kidney disease: Secondary | ICD-10-CM | POA: Diagnosis not present

## 2015-03-29 DIAGNOSIS — N2581 Secondary hyperparathyroidism of renal origin: Secondary | ICD-10-CM | POA: Diagnosis not present

## 2015-03-31 DIAGNOSIS — N2581 Secondary hyperparathyroidism of renal origin: Secondary | ICD-10-CM | POA: Diagnosis not present

## 2015-03-31 DIAGNOSIS — D509 Iron deficiency anemia, unspecified: Secondary | ICD-10-CM | POA: Diagnosis not present

## 2015-03-31 DIAGNOSIS — N186 End stage renal disease: Secondary | ICD-10-CM | POA: Diagnosis not present

## 2015-03-31 DIAGNOSIS — D631 Anemia in chronic kidney disease: Secondary | ICD-10-CM | POA: Diagnosis not present

## 2015-04-03 ENCOUNTER — Other Ambulatory Visit (HOSPITAL_COMMUNITY): Payer: Self-pay | Admitting: Interventional Radiology

## 2015-04-03 DIAGNOSIS — N186 End stage renal disease: Secondary | ICD-10-CM | POA: Diagnosis not present

## 2015-04-03 DIAGNOSIS — D509 Iron deficiency anemia, unspecified: Secondary | ICD-10-CM | POA: Diagnosis not present

## 2015-04-03 DIAGNOSIS — N2581 Secondary hyperparathyroidism of renal origin: Secondary | ICD-10-CM | POA: Diagnosis not present

## 2015-04-03 DIAGNOSIS — D631 Anemia in chronic kidney disease: Secondary | ICD-10-CM | POA: Diagnosis not present

## 2015-04-04 ENCOUNTER — Other Ambulatory Visit (HOSPITAL_COMMUNITY): Payer: Self-pay | Admitting: Interventional Radiology

## 2015-04-04 DIAGNOSIS — N281 Cyst of kidney, acquired: Secondary | ICD-10-CM

## 2015-04-05 DIAGNOSIS — N186 End stage renal disease: Secondary | ICD-10-CM | POA: Diagnosis not present

## 2015-04-05 DIAGNOSIS — D509 Iron deficiency anemia, unspecified: Secondary | ICD-10-CM | POA: Diagnosis not present

## 2015-04-05 DIAGNOSIS — D631 Anemia in chronic kidney disease: Secondary | ICD-10-CM | POA: Diagnosis not present

## 2015-04-05 DIAGNOSIS — N2581 Secondary hyperparathyroidism of renal origin: Secondary | ICD-10-CM | POA: Diagnosis not present

## 2015-04-07 DIAGNOSIS — N186 End stage renal disease: Secondary | ICD-10-CM | POA: Diagnosis not present

## 2015-04-07 DIAGNOSIS — N2581 Secondary hyperparathyroidism of renal origin: Secondary | ICD-10-CM | POA: Diagnosis not present

## 2015-04-07 DIAGNOSIS — D631 Anemia in chronic kidney disease: Secondary | ICD-10-CM | POA: Diagnosis not present

## 2015-04-07 DIAGNOSIS — D509 Iron deficiency anemia, unspecified: Secondary | ICD-10-CM | POA: Diagnosis not present

## 2015-04-10 DIAGNOSIS — N186 End stage renal disease: Secondary | ICD-10-CM | POA: Diagnosis not present

## 2015-04-10 DIAGNOSIS — N2581 Secondary hyperparathyroidism of renal origin: Secondary | ICD-10-CM | POA: Diagnosis not present

## 2015-04-10 DIAGNOSIS — D509 Iron deficiency anemia, unspecified: Secondary | ICD-10-CM | POA: Diagnosis not present

## 2015-04-10 DIAGNOSIS — D631 Anemia in chronic kidney disease: Secondary | ICD-10-CM | POA: Diagnosis not present

## 2015-04-12 DIAGNOSIS — I120 Hypertensive chronic kidney disease with stage 5 chronic kidney disease or end stage renal disease: Secondary | ICD-10-CM | POA: Diagnosis not present

## 2015-04-12 DIAGNOSIS — Z992 Dependence on renal dialysis: Secondary | ICD-10-CM | POA: Diagnosis not present

## 2015-04-12 DIAGNOSIS — N186 End stage renal disease: Secondary | ICD-10-CM | POA: Diagnosis not present

## 2015-04-12 DIAGNOSIS — D631 Anemia in chronic kidney disease: Secondary | ICD-10-CM | POA: Diagnosis not present

## 2015-04-12 DIAGNOSIS — D509 Iron deficiency anemia, unspecified: Secondary | ICD-10-CM | POA: Diagnosis not present

## 2015-04-12 DIAGNOSIS — N2581 Secondary hyperparathyroidism of renal origin: Secondary | ICD-10-CM | POA: Diagnosis not present

## 2015-04-14 DIAGNOSIS — N186 End stage renal disease: Secondary | ICD-10-CM | POA: Diagnosis not present

## 2015-04-14 DIAGNOSIS — Z23 Encounter for immunization: Secondary | ICD-10-CM | POA: Diagnosis not present

## 2015-04-14 DIAGNOSIS — D509 Iron deficiency anemia, unspecified: Secondary | ICD-10-CM | POA: Diagnosis not present

## 2015-04-14 DIAGNOSIS — N2581 Secondary hyperparathyroidism of renal origin: Secondary | ICD-10-CM | POA: Diagnosis not present

## 2015-04-14 DIAGNOSIS — D631 Anemia in chronic kidney disease: Secondary | ICD-10-CM | POA: Diagnosis not present

## 2015-04-17 DIAGNOSIS — D631 Anemia in chronic kidney disease: Secondary | ICD-10-CM | POA: Diagnosis not present

## 2015-04-17 DIAGNOSIS — N2581 Secondary hyperparathyroidism of renal origin: Secondary | ICD-10-CM | POA: Diagnosis not present

## 2015-04-17 DIAGNOSIS — Z23 Encounter for immunization: Secondary | ICD-10-CM | POA: Diagnosis not present

## 2015-04-17 DIAGNOSIS — D509 Iron deficiency anemia, unspecified: Secondary | ICD-10-CM | POA: Diagnosis not present

## 2015-04-17 DIAGNOSIS — N186 End stage renal disease: Secondary | ICD-10-CM | POA: Diagnosis not present

## 2015-04-19 DIAGNOSIS — N186 End stage renal disease: Secondary | ICD-10-CM | POA: Diagnosis not present

## 2015-04-19 DIAGNOSIS — D509 Iron deficiency anemia, unspecified: Secondary | ICD-10-CM | POA: Diagnosis not present

## 2015-04-19 DIAGNOSIS — D631 Anemia in chronic kidney disease: Secondary | ICD-10-CM | POA: Diagnosis not present

## 2015-04-19 DIAGNOSIS — N2581 Secondary hyperparathyroidism of renal origin: Secondary | ICD-10-CM | POA: Diagnosis not present

## 2015-04-19 DIAGNOSIS — Z23 Encounter for immunization: Secondary | ICD-10-CM | POA: Diagnosis not present

## 2015-04-21 DIAGNOSIS — N2581 Secondary hyperparathyroidism of renal origin: Secondary | ICD-10-CM | POA: Diagnosis not present

## 2015-04-21 DIAGNOSIS — Z23 Encounter for immunization: Secondary | ICD-10-CM | POA: Diagnosis not present

## 2015-04-21 DIAGNOSIS — N186 End stage renal disease: Secondary | ICD-10-CM | POA: Diagnosis not present

## 2015-04-21 DIAGNOSIS — D509 Iron deficiency anemia, unspecified: Secondary | ICD-10-CM | POA: Diagnosis not present

## 2015-04-21 DIAGNOSIS — D631 Anemia in chronic kidney disease: Secondary | ICD-10-CM | POA: Diagnosis not present

## 2015-04-24 DIAGNOSIS — D631 Anemia in chronic kidney disease: Secondary | ICD-10-CM | POA: Diagnosis not present

## 2015-04-24 DIAGNOSIS — N2581 Secondary hyperparathyroidism of renal origin: Secondary | ICD-10-CM | POA: Diagnosis not present

## 2015-04-24 DIAGNOSIS — D509 Iron deficiency anemia, unspecified: Secondary | ICD-10-CM | POA: Diagnosis not present

## 2015-04-24 DIAGNOSIS — Z23 Encounter for immunization: Secondary | ICD-10-CM | POA: Diagnosis not present

## 2015-04-24 DIAGNOSIS — N186 End stage renal disease: Secondary | ICD-10-CM | POA: Diagnosis not present

## 2015-04-26 DIAGNOSIS — N2581 Secondary hyperparathyroidism of renal origin: Secondary | ICD-10-CM | POA: Diagnosis not present

## 2015-04-26 DIAGNOSIS — D509 Iron deficiency anemia, unspecified: Secondary | ICD-10-CM | POA: Diagnosis not present

## 2015-04-26 DIAGNOSIS — Z23 Encounter for immunization: Secondary | ICD-10-CM | POA: Diagnosis not present

## 2015-04-26 DIAGNOSIS — D631 Anemia in chronic kidney disease: Secondary | ICD-10-CM | POA: Diagnosis not present

## 2015-04-26 DIAGNOSIS — N186 End stage renal disease: Secondary | ICD-10-CM | POA: Diagnosis not present

## 2015-04-28 DIAGNOSIS — N186 End stage renal disease: Secondary | ICD-10-CM | POA: Diagnosis not present

## 2015-04-28 DIAGNOSIS — D509 Iron deficiency anemia, unspecified: Secondary | ICD-10-CM | POA: Diagnosis not present

## 2015-04-28 DIAGNOSIS — N2581 Secondary hyperparathyroidism of renal origin: Secondary | ICD-10-CM | POA: Diagnosis not present

## 2015-04-28 DIAGNOSIS — D631 Anemia in chronic kidney disease: Secondary | ICD-10-CM | POA: Diagnosis not present

## 2015-04-28 DIAGNOSIS — Z23 Encounter for immunization: Secondary | ICD-10-CM | POA: Diagnosis not present

## 2015-05-01 DIAGNOSIS — D631 Anemia in chronic kidney disease: Secondary | ICD-10-CM | POA: Diagnosis not present

## 2015-05-01 DIAGNOSIS — N2581 Secondary hyperparathyroidism of renal origin: Secondary | ICD-10-CM | POA: Diagnosis not present

## 2015-05-01 DIAGNOSIS — Z23 Encounter for immunization: Secondary | ICD-10-CM | POA: Diagnosis not present

## 2015-05-01 DIAGNOSIS — N186 End stage renal disease: Secondary | ICD-10-CM | POA: Diagnosis not present

## 2015-05-01 DIAGNOSIS — D509 Iron deficiency anemia, unspecified: Secondary | ICD-10-CM | POA: Diagnosis not present

## 2015-05-03 DIAGNOSIS — N186 End stage renal disease: Secondary | ICD-10-CM | POA: Diagnosis not present

## 2015-05-03 DIAGNOSIS — Z23 Encounter for immunization: Secondary | ICD-10-CM | POA: Diagnosis not present

## 2015-05-03 DIAGNOSIS — D631 Anemia in chronic kidney disease: Secondary | ICD-10-CM | POA: Diagnosis not present

## 2015-05-03 DIAGNOSIS — N2581 Secondary hyperparathyroidism of renal origin: Secondary | ICD-10-CM | POA: Diagnosis not present

## 2015-05-03 DIAGNOSIS — D509 Iron deficiency anemia, unspecified: Secondary | ICD-10-CM | POA: Diagnosis not present

## 2015-05-05 DIAGNOSIS — D631 Anemia in chronic kidney disease: Secondary | ICD-10-CM | POA: Diagnosis not present

## 2015-05-05 DIAGNOSIS — N2581 Secondary hyperparathyroidism of renal origin: Secondary | ICD-10-CM | POA: Diagnosis not present

## 2015-05-05 DIAGNOSIS — Z23 Encounter for immunization: Secondary | ICD-10-CM | POA: Diagnosis not present

## 2015-05-05 DIAGNOSIS — N186 End stage renal disease: Secondary | ICD-10-CM | POA: Diagnosis not present

## 2015-05-05 DIAGNOSIS — D509 Iron deficiency anemia, unspecified: Secondary | ICD-10-CM | POA: Diagnosis not present

## 2015-05-08 DIAGNOSIS — Z23 Encounter for immunization: Secondary | ICD-10-CM | POA: Diagnosis not present

## 2015-05-08 DIAGNOSIS — D631 Anemia in chronic kidney disease: Secondary | ICD-10-CM | POA: Diagnosis not present

## 2015-05-08 DIAGNOSIS — N186 End stage renal disease: Secondary | ICD-10-CM | POA: Diagnosis not present

## 2015-05-08 DIAGNOSIS — N2581 Secondary hyperparathyroidism of renal origin: Secondary | ICD-10-CM | POA: Diagnosis not present

## 2015-05-08 DIAGNOSIS — D509 Iron deficiency anemia, unspecified: Secondary | ICD-10-CM | POA: Diagnosis not present

## 2015-05-10 DIAGNOSIS — N2581 Secondary hyperparathyroidism of renal origin: Secondary | ICD-10-CM | POA: Diagnosis not present

## 2015-05-10 DIAGNOSIS — Z23 Encounter for immunization: Secondary | ICD-10-CM | POA: Diagnosis not present

## 2015-05-10 DIAGNOSIS — D631 Anemia in chronic kidney disease: Secondary | ICD-10-CM | POA: Diagnosis not present

## 2015-05-10 DIAGNOSIS — N186 End stage renal disease: Secondary | ICD-10-CM | POA: Diagnosis not present

## 2015-05-10 DIAGNOSIS — D509 Iron deficiency anemia, unspecified: Secondary | ICD-10-CM | POA: Diagnosis not present

## 2015-05-12 DIAGNOSIS — D509 Iron deficiency anemia, unspecified: Secondary | ICD-10-CM | POA: Diagnosis not present

## 2015-05-12 DIAGNOSIS — N2581 Secondary hyperparathyroidism of renal origin: Secondary | ICD-10-CM | POA: Diagnosis not present

## 2015-05-12 DIAGNOSIS — Z992 Dependence on renal dialysis: Secondary | ICD-10-CM | POA: Diagnosis not present

## 2015-05-12 DIAGNOSIS — Z23 Encounter for immunization: Secondary | ICD-10-CM | POA: Diagnosis not present

## 2015-05-12 DIAGNOSIS — I120 Hypertensive chronic kidney disease with stage 5 chronic kidney disease or end stage renal disease: Secondary | ICD-10-CM | POA: Diagnosis not present

## 2015-05-12 DIAGNOSIS — N186 End stage renal disease: Secondary | ICD-10-CM | POA: Diagnosis not present

## 2015-05-12 DIAGNOSIS — D631 Anemia in chronic kidney disease: Secondary | ICD-10-CM | POA: Diagnosis not present

## 2015-05-15 DIAGNOSIS — N186 End stage renal disease: Secondary | ICD-10-CM | POA: Diagnosis not present

## 2015-05-15 DIAGNOSIS — N2581 Secondary hyperparathyroidism of renal origin: Secondary | ICD-10-CM | POA: Diagnosis not present

## 2015-05-15 DIAGNOSIS — D631 Anemia in chronic kidney disease: Secondary | ICD-10-CM | POA: Diagnosis not present

## 2015-05-17 DIAGNOSIS — N2581 Secondary hyperparathyroidism of renal origin: Secondary | ICD-10-CM | POA: Diagnosis not present

## 2015-05-17 DIAGNOSIS — D631 Anemia in chronic kidney disease: Secondary | ICD-10-CM | POA: Diagnosis not present

## 2015-05-17 DIAGNOSIS — N186 End stage renal disease: Secondary | ICD-10-CM | POA: Diagnosis not present

## 2015-05-19 DIAGNOSIS — D631 Anemia in chronic kidney disease: Secondary | ICD-10-CM | POA: Diagnosis not present

## 2015-05-19 DIAGNOSIS — N2581 Secondary hyperparathyroidism of renal origin: Secondary | ICD-10-CM | POA: Diagnosis not present

## 2015-05-19 DIAGNOSIS — N186 End stage renal disease: Secondary | ICD-10-CM | POA: Diagnosis not present

## 2015-05-22 DIAGNOSIS — D631 Anemia in chronic kidney disease: Secondary | ICD-10-CM | POA: Diagnosis not present

## 2015-05-22 DIAGNOSIS — N2581 Secondary hyperparathyroidism of renal origin: Secondary | ICD-10-CM | POA: Diagnosis not present

## 2015-05-22 DIAGNOSIS — N186 End stage renal disease: Secondary | ICD-10-CM | POA: Diagnosis not present

## 2015-05-24 DIAGNOSIS — D631 Anemia in chronic kidney disease: Secondary | ICD-10-CM | POA: Diagnosis not present

## 2015-05-24 DIAGNOSIS — N2581 Secondary hyperparathyroidism of renal origin: Secondary | ICD-10-CM | POA: Diagnosis not present

## 2015-05-24 DIAGNOSIS — N186 End stage renal disease: Secondary | ICD-10-CM | POA: Diagnosis not present

## 2015-05-26 DIAGNOSIS — D631 Anemia in chronic kidney disease: Secondary | ICD-10-CM | POA: Diagnosis not present

## 2015-05-26 DIAGNOSIS — N2581 Secondary hyperparathyroidism of renal origin: Secondary | ICD-10-CM | POA: Diagnosis not present

## 2015-05-26 DIAGNOSIS — N186 End stage renal disease: Secondary | ICD-10-CM | POA: Diagnosis not present

## 2015-05-29 DIAGNOSIS — N2581 Secondary hyperparathyroidism of renal origin: Secondary | ICD-10-CM | POA: Diagnosis not present

## 2015-05-29 DIAGNOSIS — N186 End stage renal disease: Secondary | ICD-10-CM | POA: Diagnosis not present

## 2015-05-29 DIAGNOSIS — D631 Anemia in chronic kidney disease: Secondary | ICD-10-CM | POA: Diagnosis not present

## 2015-05-30 DIAGNOSIS — Z23 Encounter for immunization: Secondary | ICD-10-CM | POA: Diagnosis not present

## 2015-05-31 DIAGNOSIS — N2581 Secondary hyperparathyroidism of renal origin: Secondary | ICD-10-CM | POA: Diagnosis not present

## 2015-05-31 DIAGNOSIS — D631 Anemia in chronic kidney disease: Secondary | ICD-10-CM | POA: Diagnosis not present

## 2015-05-31 DIAGNOSIS — N186 End stage renal disease: Secondary | ICD-10-CM | POA: Diagnosis not present

## 2015-06-02 DIAGNOSIS — N2581 Secondary hyperparathyroidism of renal origin: Secondary | ICD-10-CM | POA: Diagnosis not present

## 2015-06-02 DIAGNOSIS — N186 End stage renal disease: Secondary | ICD-10-CM | POA: Diagnosis not present

## 2015-06-02 DIAGNOSIS — D631 Anemia in chronic kidney disease: Secondary | ICD-10-CM | POA: Diagnosis not present

## 2015-06-05 DIAGNOSIS — N2581 Secondary hyperparathyroidism of renal origin: Secondary | ICD-10-CM | POA: Diagnosis not present

## 2015-06-05 DIAGNOSIS — D631 Anemia in chronic kidney disease: Secondary | ICD-10-CM | POA: Diagnosis not present

## 2015-06-05 DIAGNOSIS — N186 End stage renal disease: Secondary | ICD-10-CM | POA: Diagnosis not present

## 2015-06-07 DIAGNOSIS — D631 Anemia in chronic kidney disease: Secondary | ICD-10-CM | POA: Diagnosis not present

## 2015-06-07 DIAGNOSIS — N2581 Secondary hyperparathyroidism of renal origin: Secondary | ICD-10-CM | POA: Diagnosis not present

## 2015-06-07 DIAGNOSIS — N186 End stage renal disease: Secondary | ICD-10-CM | POA: Diagnosis not present

## 2015-06-08 DIAGNOSIS — K219 Gastro-esophageal reflux disease without esophagitis: Secondary | ICD-10-CM | POA: Diagnosis not present

## 2015-06-08 DIAGNOSIS — E039 Hypothyroidism, unspecified: Secondary | ICD-10-CM | POA: Diagnosis not present

## 2015-06-08 DIAGNOSIS — I10 Essential (primary) hypertension: Secondary | ICD-10-CM | POA: Diagnosis not present

## 2015-06-08 DIAGNOSIS — E663 Overweight: Secondary | ICD-10-CM | POA: Diagnosis not present

## 2015-06-08 DIAGNOSIS — N186 End stage renal disease: Secondary | ICD-10-CM | POA: Diagnosis not present

## 2015-06-08 DIAGNOSIS — Z6831 Body mass index (BMI) 31.0-31.9, adult: Secondary | ICD-10-CM | POA: Diagnosis not present

## 2015-06-08 DIAGNOSIS — R630 Anorexia: Secondary | ICD-10-CM | POA: Diagnosis not present

## 2015-06-08 DIAGNOSIS — E785 Hyperlipidemia, unspecified: Secondary | ICD-10-CM | POA: Diagnosis not present

## 2015-06-09 DIAGNOSIS — N186 End stage renal disease: Secondary | ICD-10-CM | POA: Diagnosis not present

## 2015-06-09 DIAGNOSIS — D631 Anemia in chronic kidney disease: Secondary | ICD-10-CM | POA: Diagnosis not present

## 2015-06-09 DIAGNOSIS — N2581 Secondary hyperparathyroidism of renal origin: Secondary | ICD-10-CM | POA: Diagnosis not present

## 2015-06-12 DIAGNOSIS — I120 Hypertensive chronic kidney disease with stage 5 chronic kidney disease or end stage renal disease: Secondary | ICD-10-CM | POA: Diagnosis not present

## 2015-06-12 DIAGNOSIS — Z992 Dependence on renal dialysis: Secondary | ICD-10-CM | POA: Diagnosis not present

## 2015-06-12 DIAGNOSIS — N2581 Secondary hyperparathyroidism of renal origin: Secondary | ICD-10-CM | POA: Diagnosis not present

## 2015-06-12 DIAGNOSIS — N186 End stage renal disease: Secondary | ICD-10-CM | POA: Diagnosis not present

## 2015-06-12 DIAGNOSIS — D631 Anemia in chronic kidney disease: Secondary | ICD-10-CM | POA: Diagnosis not present

## 2015-06-14 DIAGNOSIS — N186 End stage renal disease: Secondary | ICD-10-CM | POA: Diagnosis not present

## 2015-06-14 DIAGNOSIS — D631 Anemia in chronic kidney disease: Secondary | ICD-10-CM | POA: Diagnosis not present

## 2015-06-14 DIAGNOSIS — D509 Iron deficiency anemia, unspecified: Secondary | ICD-10-CM | POA: Diagnosis not present

## 2015-06-14 DIAGNOSIS — N2581 Secondary hyperparathyroidism of renal origin: Secondary | ICD-10-CM | POA: Diagnosis not present

## 2015-06-16 DIAGNOSIS — D631 Anemia in chronic kidney disease: Secondary | ICD-10-CM | POA: Diagnosis not present

## 2015-06-16 DIAGNOSIS — N186 End stage renal disease: Secondary | ICD-10-CM | POA: Diagnosis not present

## 2015-06-16 DIAGNOSIS — N2581 Secondary hyperparathyroidism of renal origin: Secondary | ICD-10-CM | POA: Diagnosis not present

## 2015-06-16 DIAGNOSIS — D509 Iron deficiency anemia, unspecified: Secondary | ICD-10-CM | POA: Diagnosis not present

## 2015-06-19 DIAGNOSIS — N186 End stage renal disease: Secondary | ICD-10-CM | POA: Diagnosis not present

## 2015-06-19 DIAGNOSIS — D631 Anemia in chronic kidney disease: Secondary | ICD-10-CM | POA: Diagnosis not present

## 2015-06-19 DIAGNOSIS — D509 Iron deficiency anemia, unspecified: Secondary | ICD-10-CM | POA: Diagnosis not present

## 2015-06-19 DIAGNOSIS — N2581 Secondary hyperparathyroidism of renal origin: Secondary | ICD-10-CM | POA: Diagnosis not present

## 2015-06-21 DIAGNOSIS — D509 Iron deficiency anemia, unspecified: Secondary | ICD-10-CM | POA: Diagnosis not present

## 2015-06-21 DIAGNOSIS — N2581 Secondary hyperparathyroidism of renal origin: Secondary | ICD-10-CM | POA: Diagnosis not present

## 2015-06-21 DIAGNOSIS — N186 End stage renal disease: Secondary | ICD-10-CM | POA: Diagnosis not present

## 2015-06-21 DIAGNOSIS — D631 Anemia in chronic kidney disease: Secondary | ICD-10-CM | POA: Diagnosis not present

## 2015-06-23 DIAGNOSIS — D509 Iron deficiency anemia, unspecified: Secondary | ICD-10-CM | POA: Diagnosis not present

## 2015-06-23 DIAGNOSIS — N2581 Secondary hyperparathyroidism of renal origin: Secondary | ICD-10-CM | POA: Diagnosis not present

## 2015-06-23 DIAGNOSIS — N186 End stage renal disease: Secondary | ICD-10-CM | POA: Diagnosis not present

## 2015-06-23 DIAGNOSIS — D631 Anemia in chronic kidney disease: Secondary | ICD-10-CM | POA: Diagnosis not present

## 2015-06-26 DIAGNOSIS — N2581 Secondary hyperparathyroidism of renal origin: Secondary | ICD-10-CM | POA: Diagnosis not present

## 2015-06-26 DIAGNOSIS — D509 Iron deficiency anemia, unspecified: Secondary | ICD-10-CM | POA: Diagnosis not present

## 2015-06-26 DIAGNOSIS — N186 End stage renal disease: Secondary | ICD-10-CM | POA: Diagnosis not present

## 2015-06-26 DIAGNOSIS — D631 Anemia in chronic kidney disease: Secondary | ICD-10-CM | POA: Diagnosis not present

## 2015-06-28 ENCOUNTER — Other Ambulatory Visit: Payer: Medicare Other

## 2015-06-28 ENCOUNTER — Other Ambulatory Visit (HOSPITAL_COMMUNITY): Payer: Medicare Other

## 2015-06-28 DIAGNOSIS — N2581 Secondary hyperparathyroidism of renal origin: Secondary | ICD-10-CM | POA: Diagnosis not present

## 2015-06-28 DIAGNOSIS — D509 Iron deficiency anemia, unspecified: Secondary | ICD-10-CM | POA: Diagnosis not present

## 2015-06-28 DIAGNOSIS — N186 End stage renal disease: Secondary | ICD-10-CM | POA: Diagnosis not present

## 2015-06-28 DIAGNOSIS — D631 Anemia in chronic kidney disease: Secondary | ICD-10-CM | POA: Diagnosis not present

## 2015-06-30 DIAGNOSIS — D631 Anemia in chronic kidney disease: Secondary | ICD-10-CM | POA: Diagnosis not present

## 2015-06-30 DIAGNOSIS — N2581 Secondary hyperparathyroidism of renal origin: Secondary | ICD-10-CM | POA: Diagnosis not present

## 2015-06-30 DIAGNOSIS — D509 Iron deficiency anemia, unspecified: Secondary | ICD-10-CM | POA: Diagnosis not present

## 2015-06-30 DIAGNOSIS — N186 End stage renal disease: Secondary | ICD-10-CM | POA: Diagnosis not present

## 2015-07-03 DIAGNOSIS — D509 Iron deficiency anemia, unspecified: Secondary | ICD-10-CM | POA: Diagnosis not present

## 2015-07-03 DIAGNOSIS — D631 Anemia in chronic kidney disease: Secondary | ICD-10-CM | POA: Diagnosis not present

## 2015-07-03 DIAGNOSIS — N2581 Secondary hyperparathyroidism of renal origin: Secondary | ICD-10-CM | POA: Diagnosis not present

## 2015-07-03 DIAGNOSIS — N186 End stage renal disease: Secondary | ICD-10-CM | POA: Diagnosis not present

## 2015-07-05 DIAGNOSIS — N186 End stage renal disease: Secondary | ICD-10-CM | POA: Diagnosis not present

## 2015-07-05 DIAGNOSIS — N2581 Secondary hyperparathyroidism of renal origin: Secondary | ICD-10-CM | POA: Diagnosis not present

## 2015-07-05 DIAGNOSIS — D631 Anemia in chronic kidney disease: Secondary | ICD-10-CM | POA: Diagnosis not present

## 2015-07-05 DIAGNOSIS — D509 Iron deficiency anemia, unspecified: Secondary | ICD-10-CM | POA: Diagnosis not present

## 2015-07-07 DIAGNOSIS — D631 Anemia in chronic kidney disease: Secondary | ICD-10-CM | POA: Diagnosis not present

## 2015-07-07 DIAGNOSIS — D509 Iron deficiency anemia, unspecified: Secondary | ICD-10-CM | POA: Diagnosis not present

## 2015-07-07 DIAGNOSIS — N186 End stage renal disease: Secondary | ICD-10-CM | POA: Diagnosis not present

## 2015-07-07 DIAGNOSIS — N2581 Secondary hyperparathyroidism of renal origin: Secondary | ICD-10-CM | POA: Diagnosis not present

## 2015-07-10 DIAGNOSIS — N2581 Secondary hyperparathyroidism of renal origin: Secondary | ICD-10-CM | POA: Diagnosis not present

## 2015-07-10 DIAGNOSIS — N186 End stage renal disease: Secondary | ICD-10-CM | POA: Diagnosis not present

## 2015-07-10 DIAGNOSIS — D509 Iron deficiency anemia, unspecified: Secondary | ICD-10-CM | POA: Diagnosis not present

## 2015-07-10 DIAGNOSIS — D631 Anemia in chronic kidney disease: Secondary | ICD-10-CM | POA: Diagnosis not present

## 2015-07-12 DIAGNOSIS — D509 Iron deficiency anemia, unspecified: Secondary | ICD-10-CM | POA: Diagnosis not present

## 2015-07-12 DIAGNOSIS — I120 Hypertensive chronic kidney disease with stage 5 chronic kidney disease or end stage renal disease: Secondary | ICD-10-CM | POA: Diagnosis not present

## 2015-07-12 DIAGNOSIS — D631 Anemia in chronic kidney disease: Secondary | ICD-10-CM | POA: Diagnosis not present

## 2015-07-12 DIAGNOSIS — Z992 Dependence on renal dialysis: Secondary | ICD-10-CM | POA: Diagnosis not present

## 2015-07-12 DIAGNOSIS — N186 End stage renal disease: Secondary | ICD-10-CM | POA: Diagnosis not present

## 2015-07-12 DIAGNOSIS — N2581 Secondary hyperparathyroidism of renal origin: Secondary | ICD-10-CM | POA: Diagnosis not present

## 2015-07-14 DIAGNOSIS — D509 Iron deficiency anemia, unspecified: Secondary | ICD-10-CM | POA: Diagnosis not present

## 2015-07-14 DIAGNOSIS — D631 Anemia in chronic kidney disease: Secondary | ICD-10-CM | POA: Diagnosis not present

## 2015-07-14 DIAGNOSIS — N186 End stage renal disease: Secondary | ICD-10-CM | POA: Diagnosis not present

## 2015-07-14 DIAGNOSIS — N2581 Secondary hyperparathyroidism of renal origin: Secondary | ICD-10-CM | POA: Diagnosis not present

## 2015-07-17 DIAGNOSIS — D631 Anemia in chronic kidney disease: Secondary | ICD-10-CM | POA: Diagnosis not present

## 2015-07-17 DIAGNOSIS — N2581 Secondary hyperparathyroidism of renal origin: Secondary | ICD-10-CM | POA: Diagnosis not present

## 2015-07-17 DIAGNOSIS — N186 End stage renal disease: Secondary | ICD-10-CM | POA: Diagnosis not present

## 2015-07-17 DIAGNOSIS — D509 Iron deficiency anemia, unspecified: Secondary | ICD-10-CM | POA: Diagnosis not present

## 2015-07-19 DIAGNOSIS — N186 End stage renal disease: Secondary | ICD-10-CM | POA: Diagnosis not present

## 2015-07-19 DIAGNOSIS — D631 Anemia in chronic kidney disease: Secondary | ICD-10-CM | POA: Diagnosis not present

## 2015-07-19 DIAGNOSIS — N2581 Secondary hyperparathyroidism of renal origin: Secondary | ICD-10-CM | POA: Diagnosis not present

## 2015-07-19 DIAGNOSIS — D509 Iron deficiency anemia, unspecified: Secondary | ICD-10-CM | POA: Diagnosis not present

## 2015-07-21 DIAGNOSIS — N186 End stage renal disease: Secondary | ICD-10-CM | POA: Diagnosis not present

## 2015-07-21 DIAGNOSIS — N2581 Secondary hyperparathyroidism of renal origin: Secondary | ICD-10-CM | POA: Diagnosis not present

## 2015-07-21 DIAGNOSIS — D509 Iron deficiency anemia, unspecified: Secondary | ICD-10-CM | POA: Diagnosis not present

## 2015-07-21 DIAGNOSIS — D631 Anemia in chronic kidney disease: Secondary | ICD-10-CM | POA: Diagnosis not present

## 2015-07-24 DIAGNOSIS — N2581 Secondary hyperparathyroidism of renal origin: Secondary | ICD-10-CM | POA: Diagnosis not present

## 2015-07-24 DIAGNOSIS — N186 End stage renal disease: Secondary | ICD-10-CM | POA: Diagnosis not present

## 2015-07-24 DIAGNOSIS — D509 Iron deficiency anemia, unspecified: Secondary | ICD-10-CM | POA: Diagnosis not present

## 2015-07-24 DIAGNOSIS — D631 Anemia in chronic kidney disease: Secondary | ICD-10-CM | POA: Diagnosis not present

## 2015-07-26 DIAGNOSIS — N186 End stage renal disease: Secondary | ICD-10-CM | POA: Diagnosis not present

## 2015-07-26 DIAGNOSIS — D631 Anemia in chronic kidney disease: Secondary | ICD-10-CM | POA: Diagnosis not present

## 2015-07-26 DIAGNOSIS — D509 Iron deficiency anemia, unspecified: Secondary | ICD-10-CM | POA: Diagnosis not present

## 2015-07-26 DIAGNOSIS — N2581 Secondary hyperparathyroidism of renal origin: Secondary | ICD-10-CM | POA: Diagnosis not present

## 2015-07-28 DIAGNOSIS — D631 Anemia in chronic kidney disease: Secondary | ICD-10-CM | POA: Diagnosis not present

## 2015-07-28 DIAGNOSIS — D509 Iron deficiency anemia, unspecified: Secondary | ICD-10-CM | POA: Diagnosis not present

## 2015-07-28 DIAGNOSIS — N186 End stage renal disease: Secondary | ICD-10-CM | POA: Diagnosis not present

## 2015-07-28 DIAGNOSIS — N2581 Secondary hyperparathyroidism of renal origin: Secondary | ICD-10-CM | POA: Diagnosis not present

## 2015-07-31 DIAGNOSIS — D631 Anemia in chronic kidney disease: Secondary | ICD-10-CM | POA: Diagnosis not present

## 2015-07-31 DIAGNOSIS — N186 End stage renal disease: Secondary | ICD-10-CM | POA: Diagnosis not present

## 2015-07-31 DIAGNOSIS — N2581 Secondary hyperparathyroidism of renal origin: Secondary | ICD-10-CM | POA: Diagnosis not present

## 2015-07-31 DIAGNOSIS — D509 Iron deficiency anemia, unspecified: Secondary | ICD-10-CM | POA: Diagnosis not present

## 2015-08-02 DIAGNOSIS — D509 Iron deficiency anemia, unspecified: Secondary | ICD-10-CM | POA: Diagnosis not present

## 2015-08-02 DIAGNOSIS — N186 End stage renal disease: Secondary | ICD-10-CM | POA: Diagnosis not present

## 2015-08-02 DIAGNOSIS — D631 Anemia in chronic kidney disease: Secondary | ICD-10-CM | POA: Diagnosis not present

## 2015-08-02 DIAGNOSIS — N2581 Secondary hyperparathyroidism of renal origin: Secondary | ICD-10-CM | POA: Diagnosis not present

## 2015-08-04 DIAGNOSIS — N2581 Secondary hyperparathyroidism of renal origin: Secondary | ICD-10-CM | POA: Diagnosis not present

## 2015-08-04 DIAGNOSIS — D631 Anemia in chronic kidney disease: Secondary | ICD-10-CM | POA: Diagnosis not present

## 2015-08-04 DIAGNOSIS — N186 End stage renal disease: Secondary | ICD-10-CM | POA: Diagnosis not present

## 2015-08-04 DIAGNOSIS — D509 Iron deficiency anemia, unspecified: Secondary | ICD-10-CM | POA: Diagnosis not present

## 2015-08-07 DIAGNOSIS — D631 Anemia in chronic kidney disease: Secondary | ICD-10-CM | POA: Diagnosis not present

## 2015-08-07 DIAGNOSIS — N186 End stage renal disease: Secondary | ICD-10-CM | POA: Diagnosis not present

## 2015-08-07 DIAGNOSIS — N2581 Secondary hyperparathyroidism of renal origin: Secondary | ICD-10-CM | POA: Diagnosis not present

## 2015-08-07 DIAGNOSIS — D509 Iron deficiency anemia, unspecified: Secondary | ICD-10-CM | POA: Diagnosis not present

## 2015-08-09 DIAGNOSIS — D509 Iron deficiency anemia, unspecified: Secondary | ICD-10-CM | POA: Diagnosis not present

## 2015-08-09 DIAGNOSIS — N186 End stage renal disease: Secondary | ICD-10-CM | POA: Diagnosis not present

## 2015-08-09 DIAGNOSIS — N2581 Secondary hyperparathyroidism of renal origin: Secondary | ICD-10-CM | POA: Diagnosis not present

## 2015-08-09 DIAGNOSIS — D631 Anemia in chronic kidney disease: Secondary | ICD-10-CM | POA: Diagnosis not present

## 2015-08-11 DIAGNOSIS — N2581 Secondary hyperparathyroidism of renal origin: Secondary | ICD-10-CM | POA: Diagnosis not present

## 2015-08-11 DIAGNOSIS — D631 Anemia in chronic kidney disease: Secondary | ICD-10-CM | POA: Diagnosis not present

## 2015-08-11 DIAGNOSIS — N186 End stage renal disease: Secondary | ICD-10-CM | POA: Diagnosis not present

## 2015-08-11 DIAGNOSIS — D509 Iron deficiency anemia, unspecified: Secondary | ICD-10-CM | POA: Diagnosis not present

## 2015-08-12 DIAGNOSIS — Z992 Dependence on renal dialysis: Secondary | ICD-10-CM | POA: Diagnosis not present

## 2015-08-12 DIAGNOSIS — N186 End stage renal disease: Secondary | ICD-10-CM | POA: Diagnosis not present

## 2015-08-12 DIAGNOSIS — I120 Hypertensive chronic kidney disease with stage 5 chronic kidney disease or end stage renal disease: Secondary | ICD-10-CM | POA: Diagnosis not present

## 2015-08-14 DIAGNOSIS — N2581 Secondary hyperparathyroidism of renal origin: Secondary | ICD-10-CM | POA: Diagnosis not present

## 2015-08-14 DIAGNOSIS — D509 Iron deficiency anemia, unspecified: Secondary | ICD-10-CM | POA: Diagnosis not present

## 2015-08-14 DIAGNOSIS — D631 Anemia in chronic kidney disease: Secondary | ICD-10-CM | POA: Diagnosis not present

## 2015-08-14 DIAGNOSIS — N186 End stage renal disease: Secondary | ICD-10-CM | POA: Diagnosis not present

## 2015-08-16 DIAGNOSIS — N186 End stage renal disease: Secondary | ICD-10-CM | POA: Diagnosis not present

## 2015-08-16 DIAGNOSIS — N2581 Secondary hyperparathyroidism of renal origin: Secondary | ICD-10-CM | POA: Diagnosis not present

## 2015-08-16 DIAGNOSIS — D631 Anemia in chronic kidney disease: Secondary | ICD-10-CM | POA: Diagnosis not present

## 2015-08-16 DIAGNOSIS — D509 Iron deficiency anemia, unspecified: Secondary | ICD-10-CM | POA: Diagnosis not present

## 2015-08-18 DIAGNOSIS — D509 Iron deficiency anemia, unspecified: Secondary | ICD-10-CM | POA: Diagnosis not present

## 2015-08-18 DIAGNOSIS — N2581 Secondary hyperparathyroidism of renal origin: Secondary | ICD-10-CM | POA: Diagnosis not present

## 2015-08-18 DIAGNOSIS — D631 Anemia in chronic kidney disease: Secondary | ICD-10-CM | POA: Diagnosis not present

## 2015-08-18 DIAGNOSIS — N186 End stage renal disease: Secondary | ICD-10-CM | POA: Diagnosis not present

## 2015-08-21 DIAGNOSIS — N186 End stage renal disease: Secondary | ICD-10-CM | POA: Diagnosis not present

## 2015-08-21 DIAGNOSIS — N2581 Secondary hyperparathyroidism of renal origin: Secondary | ICD-10-CM | POA: Diagnosis not present

## 2015-08-21 DIAGNOSIS — D631 Anemia in chronic kidney disease: Secondary | ICD-10-CM | POA: Diagnosis not present

## 2015-08-21 DIAGNOSIS — D509 Iron deficiency anemia, unspecified: Secondary | ICD-10-CM | POA: Diagnosis not present

## 2015-08-23 DIAGNOSIS — D509 Iron deficiency anemia, unspecified: Secondary | ICD-10-CM | POA: Diagnosis not present

## 2015-08-23 DIAGNOSIS — N2581 Secondary hyperparathyroidism of renal origin: Secondary | ICD-10-CM | POA: Diagnosis not present

## 2015-08-23 DIAGNOSIS — D631 Anemia in chronic kidney disease: Secondary | ICD-10-CM | POA: Diagnosis not present

## 2015-08-23 DIAGNOSIS — N186 End stage renal disease: Secondary | ICD-10-CM | POA: Diagnosis not present

## 2015-08-25 DIAGNOSIS — N2581 Secondary hyperparathyroidism of renal origin: Secondary | ICD-10-CM | POA: Diagnosis not present

## 2015-08-25 DIAGNOSIS — D509 Iron deficiency anemia, unspecified: Secondary | ICD-10-CM | POA: Diagnosis not present

## 2015-08-25 DIAGNOSIS — D631 Anemia in chronic kidney disease: Secondary | ICD-10-CM | POA: Diagnosis not present

## 2015-08-25 DIAGNOSIS — N186 End stage renal disease: Secondary | ICD-10-CM | POA: Diagnosis not present

## 2015-08-28 DIAGNOSIS — N186 End stage renal disease: Secondary | ICD-10-CM | POA: Diagnosis not present

## 2015-08-28 DIAGNOSIS — D509 Iron deficiency anemia, unspecified: Secondary | ICD-10-CM | POA: Diagnosis not present

## 2015-08-28 DIAGNOSIS — D631 Anemia in chronic kidney disease: Secondary | ICD-10-CM | POA: Diagnosis not present

## 2015-08-28 DIAGNOSIS — N2581 Secondary hyperparathyroidism of renal origin: Secondary | ICD-10-CM | POA: Diagnosis not present

## 2015-08-30 DIAGNOSIS — N2581 Secondary hyperparathyroidism of renal origin: Secondary | ICD-10-CM | POA: Diagnosis not present

## 2015-08-30 DIAGNOSIS — N186 End stage renal disease: Secondary | ICD-10-CM | POA: Diagnosis not present

## 2015-08-30 DIAGNOSIS — D509 Iron deficiency anemia, unspecified: Secondary | ICD-10-CM | POA: Diagnosis not present

## 2015-08-30 DIAGNOSIS — D631 Anemia in chronic kidney disease: Secondary | ICD-10-CM | POA: Diagnosis not present

## 2015-09-01 DIAGNOSIS — D509 Iron deficiency anemia, unspecified: Secondary | ICD-10-CM | POA: Diagnosis not present

## 2015-09-01 DIAGNOSIS — N2581 Secondary hyperparathyroidism of renal origin: Secondary | ICD-10-CM | POA: Diagnosis not present

## 2015-09-01 DIAGNOSIS — N186 End stage renal disease: Secondary | ICD-10-CM | POA: Diagnosis not present

## 2015-09-01 DIAGNOSIS — D631 Anemia in chronic kidney disease: Secondary | ICD-10-CM | POA: Diagnosis not present

## 2015-09-04 DIAGNOSIS — N186 End stage renal disease: Secondary | ICD-10-CM | POA: Diagnosis not present

## 2015-09-04 DIAGNOSIS — N2581 Secondary hyperparathyroidism of renal origin: Secondary | ICD-10-CM | POA: Diagnosis not present

## 2015-09-04 DIAGNOSIS — D509 Iron deficiency anemia, unspecified: Secondary | ICD-10-CM | POA: Diagnosis not present

## 2015-09-04 DIAGNOSIS — D631 Anemia in chronic kidney disease: Secondary | ICD-10-CM | POA: Diagnosis not present

## 2015-09-06 DIAGNOSIS — D509 Iron deficiency anemia, unspecified: Secondary | ICD-10-CM | POA: Diagnosis not present

## 2015-09-06 DIAGNOSIS — N2581 Secondary hyperparathyroidism of renal origin: Secondary | ICD-10-CM | POA: Diagnosis not present

## 2015-09-06 DIAGNOSIS — N186 End stage renal disease: Secondary | ICD-10-CM | POA: Diagnosis not present

## 2015-09-06 DIAGNOSIS — D631 Anemia in chronic kidney disease: Secondary | ICD-10-CM | POA: Diagnosis not present

## 2015-09-08 DIAGNOSIS — N2581 Secondary hyperparathyroidism of renal origin: Secondary | ICD-10-CM | POA: Diagnosis not present

## 2015-09-08 DIAGNOSIS — D509 Iron deficiency anemia, unspecified: Secondary | ICD-10-CM | POA: Diagnosis not present

## 2015-09-08 DIAGNOSIS — D631 Anemia in chronic kidney disease: Secondary | ICD-10-CM | POA: Diagnosis not present

## 2015-09-08 DIAGNOSIS — N186 End stage renal disease: Secondary | ICD-10-CM | POA: Diagnosis not present

## 2015-09-11 DIAGNOSIS — D509 Iron deficiency anemia, unspecified: Secondary | ICD-10-CM | POA: Diagnosis not present

## 2015-09-11 DIAGNOSIS — N2581 Secondary hyperparathyroidism of renal origin: Secondary | ICD-10-CM | POA: Diagnosis not present

## 2015-09-11 DIAGNOSIS — D631 Anemia in chronic kidney disease: Secondary | ICD-10-CM | POA: Diagnosis not present

## 2015-09-11 DIAGNOSIS — N186 End stage renal disease: Secondary | ICD-10-CM | POA: Diagnosis not present

## 2015-09-12 DIAGNOSIS — N186 End stage renal disease: Secondary | ICD-10-CM | POA: Diagnosis not present

## 2015-09-12 DIAGNOSIS — Z992 Dependence on renal dialysis: Secondary | ICD-10-CM | POA: Diagnosis not present

## 2015-09-12 DIAGNOSIS — I120 Hypertensive chronic kidney disease with stage 5 chronic kidney disease or end stage renal disease: Secondary | ICD-10-CM | POA: Diagnosis not present

## 2015-09-13 DIAGNOSIS — N186 End stage renal disease: Secondary | ICD-10-CM | POA: Diagnosis not present

## 2015-09-13 DIAGNOSIS — D631 Anemia in chronic kidney disease: Secondary | ICD-10-CM | POA: Diagnosis not present

## 2015-09-13 DIAGNOSIS — N2581 Secondary hyperparathyroidism of renal origin: Secondary | ICD-10-CM | POA: Diagnosis not present

## 2015-09-13 DIAGNOSIS — D509 Iron deficiency anemia, unspecified: Secondary | ICD-10-CM | POA: Diagnosis not present

## 2015-09-15 DIAGNOSIS — N186 End stage renal disease: Secondary | ICD-10-CM | POA: Diagnosis not present

## 2015-09-15 DIAGNOSIS — D631 Anemia in chronic kidney disease: Secondary | ICD-10-CM | POA: Diagnosis not present

## 2015-09-15 DIAGNOSIS — N2581 Secondary hyperparathyroidism of renal origin: Secondary | ICD-10-CM | POA: Diagnosis not present

## 2015-09-15 DIAGNOSIS — D509 Iron deficiency anemia, unspecified: Secondary | ICD-10-CM | POA: Diagnosis not present

## 2015-09-18 DIAGNOSIS — D631 Anemia in chronic kidney disease: Secondary | ICD-10-CM | POA: Diagnosis not present

## 2015-09-18 DIAGNOSIS — N186 End stage renal disease: Secondary | ICD-10-CM | POA: Diagnosis not present

## 2015-09-18 DIAGNOSIS — D509 Iron deficiency anemia, unspecified: Secondary | ICD-10-CM | POA: Diagnosis not present

## 2015-09-18 DIAGNOSIS — N2581 Secondary hyperparathyroidism of renal origin: Secondary | ICD-10-CM | POA: Diagnosis not present

## 2015-09-20 DIAGNOSIS — D509 Iron deficiency anemia, unspecified: Secondary | ICD-10-CM | POA: Diagnosis not present

## 2015-09-20 DIAGNOSIS — D631 Anemia in chronic kidney disease: Secondary | ICD-10-CM | POA: Diagnosis not present

## 2015-09-20 DIAGNOSIS — N186 End stage renal disease: Secondary | ICD-10-CM | POA: Diagnosis not present

## 2015-09-20 DIAGNOSIS — N2581 Secondary hyperparathyroidism of renal origin: Secondary | ICD-10-CM | POA: Diagnosis not present

## 2015-09-22 DIAGNOSIS — N186 End stage renal disease: Secondary | ICD-10-CM | POA: Diagnosis not present

## 2015-09-22 DIAGNOSIS — D631 Anemia in chronic kidney disease: Secondary | ICD-10-CM | POA: Diagnosis not present

## 2015-09-22 DIAGNOSIS — D509 Iron deficiency anemia, unspecified: Secondary | ICD-10-CM | POA: Diagnosis not present

## 2015-09-22 DIAGNOSIS — N2581 Secondary hyperparathyroidism of renal origin: Secondary | ICD-10-CM | POA: Diagnosis not present

## 2015-09-25 DIAGNOSIS — D509 Iron deficiency anemia, unspecified: Secondary | ICD-10-CM | POA: Diagnosis not present

## 2015-09-25 DIAGNOSIS — N2581 Secondary hyperparathyroidism of renal origin: Secondary | ICD-10-CM | POA: Diagnosis not present

## 2015-09-25 DIAGNOSIS — D631 Anemia in chronic kidney disease: Secondary | ICD-10-CM | POA: Diagnosis not present

## 2015-09-25 DIAGNOSIS — N186 End stage renal disease: Secondary | ICD-10-CM | POA: Diagnosis not present

## 2015-09-27 DIAGNOSIS — N186 End stage renal disease: Secondary | ICD-10-CM | POA: Diagnosis not present

## 2015-09-27 DIAGNOSIS — N2581 Secondary hyperparathyroidism of renal origin: Secondary | ICD-10-CM | POA: Diagnosis not present

## 2015-09-27 DIAGNOSIS — D631 Anemia in chronic kidney disease: Secondary | ICD-10-CM | POA: Diagnosis not present

## 2015-09-27 DIAGNOSIS — D509 Iron deficiency anemia, unspecified: Secondary | ICD-10-CM | POA: Diagnosis not present

## 2015-09-29 DIAGNOSIS — N2581 Secondary hyperparathyroidism of renal origin: Secondary | ICD-10-CM | POA: Diagnosis not present

## 2015-09-29 DIAGNOSIS — N186 End stage renal disease: Secondary | ICD-10-CM | POA: Diagnosis not present

## 2015-09-29 DIAGNOSIS — D631 Anemia in chronic kidney disease: Secondary | ICD-10-CM | POA: Diagnosis not present

## 2015-09-29 DIAGNOSIS — D509 Iron deficiency anemia, unspecified: Secondary | ICD-10-CM | POA: Diagnosis not present

## 2015-10-02 DIAGNOSIS — N2581 Secondary hyperparathyroidism of renal origin: Secondary | ICD-10-CM | POA: Diagnosis not present

## 2015-10-02 DIAGNOSIS — D631 Anemia in chronic kidney disease: Secondary | ICD-10-CM | POA: Diagnosis not present

## 2015-10-02 DIAGNOSIS — N186 End stage renal disease: Secondary | ICD-10-CM | POA: Diagnosis not present

## 2015-10-02 DIAGNOSIS — D509 Iron deficiency anemia, unspecified: Secondary | ICD-10-CM | POA: Diagnosis not present

## 2015-10-04 DIAGNOSIS — N186 End stage renal disease: Secondary | ICD-10-CM | POA: Diagnosis not present

## 2015-10-04 DIAGNOSIS — D509 Iron deficiency anemia, unspecified: Secondary | ICD-10-CM | POA: Diagnosis not present

## 2015-10-04 DIAGNOSIS — N2581 Secondary hyperparathyroidism of renal origin: Secondary | ICD-10-CM | POA: Diagnosis not present

## 2015-10-04 DIAGNOSIS — D631 Anemia in chronic kidney disease: Secondary | ICD-10-CM | POA: Diagnosis not present

## 2015-10-06 DIAGNOSIS — D509 Iron deficiency anemia, unspecified: Secondary | ICD-10-CM | POA: Diagnosis not present

## 2015-10-06 DIAGNOSIS — N186 End stage renal disease: Secondary | ICD-10-CM | POA: Diagnosis not present

## 2015-10-06 DIAGNOSIS — N2581 Secondary hyperparathyroidism of renal origin: Secondary | ICD-10-CM | POA: Diagnosis not present

## 2015-10-06 DIAGNOSIS — D631 Anemia in chronic kidney disease: Secondary | ICD-10-CM | POA: Diagnosis not present

## 2015-10-09 DIAGNOSIS — D509 Iron deficiency anemia, unspecified: Secondary | ICD-10-CM | POA: Diagnosis not present

## 2015-10-09 DIAGNOSIS — N186 End stage renal disease: Secondary | ICD-10-CM | POA: Diagnosis not present

## 2015-10-09 DIAGNOSIS — D631 Anemia in chronic kidney disease: Secondary | ICD-10-CM | POA: Diagnosis not present

## 2015-10-09 DIAGNOSIS — N2581 Secondary hyperparathyroidism of renal origin: Secondary | ICD-10-CM | POA: Diagnosis not present

## 2015-10-10 DIAGNOSIS — N186 End stage renal disease: Secondary | ICD-10-CM | POA: Diagnosis not present

## 2015-10-10 DIAGNOSIS — Z992 Dependence on renal dialysis: Secondary | ICD-10-CM | POA: Diagnosis not present

## 2015-10-10 DIAGNOSIS — I120 Hypertensive chronic kidney disease with stage 5 chronic kidney disease or end stage renal disease: Secondary | ICD-10-CM | POA: Diagnosis not present

## 2015-10-11 DIAGNOSIS — N2581 Secondary hyperparathyroidism of renal origin: Secondary | ICD-10-CM | POA: Diagnosis not present

## 2015-10-11 DIAGNOSIS — N186 End stage renal disease: Secondary | ICD-10-CM | POA: Diagnosis not present

## 2015-10-11 DIAGNOSIS — D509 Iron deficiency anemia, unspecified: Secondary | ICD-10-CM | POA: Diagnosis not present

## 2015-10-11 DIAGNOSIS — D631 Anemia in chronic kidney disease: Secondary | ICD-10-CM | POA: Diagnosis not present

## 2015-10-13 DIAGNOSIS — D631 Anemia in chronic kidney disease: Secondary | ICD-10-CM | POA: Diagnosis not present

## 2015-10-13 DIAGNOSIS — D509 Iron deficiency anemia, unspecified: Secondary | ICD-10-CM | POA: Diagnosis not present

## 2015-10-13 DIAGNOSIS — N2581 Secondary hyperparathyroidism of renal origin: Secondary | ICD-10-CM | POA: Diagnosis not present

## 2015-10-13 DIAGNOSIS — N186 End stage renal disease: Secondary | ICD-10-CM | POA: Diagnosis not present

## 2015-10-16 DIAGNOSIS — D631 Anemia in chronic kidney disease: Secondary | ICD-10-CM | POA: Diagnosis not present

## 2015-10-16 DIAGNOSIS — D509 Iron deficiency anemia, unspecified: Secondary | ICD-10-CM | POA: Diagnosis not present

## 2015-10-16 DIAGNOSIS — N2581 Secondary hyperparathyroidism of renal origin: Secondary | ICD-10-CM | POA: Diagnosis not present

## 2015-10-16 DIAGNOSIS — N186 End stage renal disease: Secondary | ICD-10-CM | POA: Diagnosis not present

## 2015-10-18 DIAGNOSIS — D509 Iron deficiency anemia, unspecified: Secondary | ICD-10-CM | POA: Diagnosis not present

## 2015-10-18 DIAGNOSIS — N186 End stage renal disease: Secondary | ICD-10-CM | POA: Diagnosis not present

## 2015-10-18 DIAGNOSIS — D631 Anemia in chronic kidney disease: Secondary | ICD-10-CM | POA: Diagnosis not present

## 2015-10-18 DIAGNOSIS — N2581 Secondary hyperparathyroidism of renal origin: Secondary | ICD-10-CM | POA: Diagnosis not present

## 2015-10-20 DIAGNOSIS — N2581 Secondary hyperparathyroidism of renal origin: Secondary | ICD-10-CM | POA: Diagnosis not present

## 2015-10-20 DIAGNOSIS — D509 Iron deficiency anemia, unspecified: Secondary | ICD-10-CM | POA: Diagnosis not present

## 2015-10-20 DIAGNOSIS — D631 Anemia in chronic kidney disease: Secondary | ICD-10-CM | POA: Diagnosis not present

## 2015-10-20 DIAGNOSIS — N186 End stage renal disease: Secondary | ICD-10-CM | POA: Diagnosis not present

## 2015-10-23 DIAGNOSIS — D631 Anemia in chronic kidney disease: Secondary | ICD-10-CM | POA: Diagnosis not present

## 2015-10-23 DIAGNOSIS — D509 Iron deficiency anemia, unspecified: Secondary | ICD-10-CM | POA: Diagnosis not present

## 2015-10-23 DIAGNOSIS — N2581 Secondary hyperparathyroidism of renal origin: Secondary | ICD-10-CM | POA: Diagnosis not present

## 2015-10-23 DIAGNOSIS — N186 End stage renal disease: Secondary | ICD-10-CM | POA: Diagnosis not present

## 2015-10-25 DIAGNOSIS — D509 Iron deficiency anemia, unspecified: Secondary | ICD-10-CM | POA: Diagnosis not present

## 2015-10-25 DIAGNOSIS — N2581 Secondary hyperparathyroidism of renal origin: Secondary | ICD-10-CM | POA: Diagnosis not present

## 2015-10-25 DIAGNOSIS — N186 End stage renal disease: Secondary | ICD-10-CM | POA: Diagnosis not present

## 2015-10-25 DIAGNOSIS — D631 Anemia in chronic kidney disease: Secondary | ICD-10-CM | POA: Diagnosis not present

## 2015-10-27 DIAGNOSIS — N186 End stage renal disease: Secondary | ICD-10-CM | POA: Diagnosis not present

## 2015-10-27 DIAGNOSIS — D631 Anemia in chronic kidney disease: Secondary | ICD-10-CM | POA: Diagnosis not present

## 2015-10-27 DIAGNOSIS — N2581 Secondary hyperparathyroidism of renal origin: Secondary | ICD-10-CM | POA: Diagnosis not present

## 2015-10-27 DIAGNOSIS — D509 Iron deficiency anemia, unspecified: Secondary | ICD-10-CM | POA: Diagnosis not present

## 2015-10-30 DIAGNOSIS — D631 Anemia in chronic kidney disease: Secondary | ICD-10-CM | POA: Diagnosis not present

## 2015-10-30 DIAGNOSIS — D509 Iron deficiency anemia, unspecified: Secondary | ICD-10-CM | POA: Diagnosis not present

## 2015-10-30 DIAGNOSIS — N186 End stage renal disease: Secondary | ICD-10-CM | POA: Diagnosis not present

## 2015-10-30 DIAGNOSIS — N2581 Secondary hyperparathyroidism of renal origin: Secondary | ICD-10-CM | POA: Diagnosis not present

## 2015-11-01 DIAGNOSIS — D509 Iron deficiency anemia, unspecified: Secondary | ICD-10-CM | POA: Diagnosis not present

## 2015-11-01 DIAGNOSIS — N186 End stage renal disease: Secondary | ICD-10-CM | POA: Diagnosis not present

## 2015-11-01 DIAGNOSIS — D631 Anemia in chronic kidney disease: Secondary | ICD-10-CM | POA: Diagnosis not present

## 2015-11-01 DIAGNOSIS — N2581 Secondary hyperparathyroidism of renal origin: Secondary | ICD-10-CM | POA: Diagnosis not present

## 2015-11-03 DIAGNOSIS — D631 Anemia in chronic kidney disease: Secondary | ICD-10-CM | POA: Diagnosis not present

## 2015-11-03 DIAGNOSIS — N2581 Secondary hyperparathyroidism of renal origin: Secondary | ICD-10-CM | POA: Diagnosis not present

## 2015-11-03 DIAGNOSIS — D509 Iron deficiency anemia, unspecified: Secondary | ICD-10-CM | POA: Diagnosis not present

## 2015-11-03 DIAGNOSIS — N186 End stage renal disease: Secondary | ICD-10-CM | POA: Diagnosis not present

## 2015-11-06 DIAGNOSIS — D509 Iron deficiency anemia, unspecified: Secondary | ICD-10-CM | POA: Diagnosis not present

## 2015-11-06 DIAGNOSIS — D631 Anemia in chronic kidney disease: Secondary | ICD-10-CM | POA: Diagnosis not present

## 2015-11-06 DIAGNOSIS — N186 End stage renal disease: Secondary | ICD-10-CM | POA: Diagnosis not present

## 2015-11-06 DIAGNOSIS — N2581 Secondary hyperparathyroidism of renal origin: Secondary | ICD-10-CM | POA: Diagnosis not present

## 2015-11-08 ENCOUNTER — Encounter: Payer: Self-pay | Admitting: *Deleted

## 2015-11-08 DIAGNOSIS — N186 End stage renal disease: Secondary | ICD-10-CM | POA: Diagnosis not present

## 2015-11-08 DIAGNOSIS — N2581 Secondary hyperparathyroidism of renal origin: Secondary | ICD-10-CM | POA: Diagnosis not present

## 2015-11-08 DIAGNOSIS — D631 Anemia in chronic kidney disease: Secondary | ICD-10-CM | POA: Diagnosis not present

## 2015-11-08 DIAGNOSIS — D509 Iron deficiency anemia, unspecified: Secondary | ICD-10-CM | POA: Diagnosis not present

## 2015-11-10 DIAGNOSIS — D509 Iron deficiency anemia, unspecified: Secondary | ICD-10-CM | POA: Diagnosis not present

## 2015-11-10 DIAGNOSIS — Z992 Dependence on renal dialysis: Secondary | ICD-10-CM | POA: Diagnosis not present

## 2015-11-10 DIAGNOSIS — N2581 Secondary hyperparathyroidism of renal origin: Secondary | ICD-10-CM | POA: Diagnosis not present

## 2015-11-10 DIAGNOSIS — I120 Hypertensive chronic kidney disease with stage 5 chronic kidney disease or end stage renal disease: Secondary | ICD-10-CM | POA: Diagnosis not present

## 2015-11-10 DIAGNOSIS — N186 End stage renal disease: Secondary | ICD-10-CM | POA: Diagnosis not present

## 2015-11-10 DIAGNOSIS — D631 Anemia in chronic kidney disease: Secondary | ICD-10-CM | POA: Diagnosis not present

## 2015-11-13 DIAGNOSIS — D509 Iron deficiency anemia, unspecified: Secondary | ICD-10-CM | POA: Diagnosis not present

## 2015-11-13 DIAGNOSIS — D631 Anemia in chronic kidney disease: Secondary | ICD-10-CM | POA: Diagnosis not present

## 2015-11-13 DIAGNOSIS — N2581 Secondary hyperparathyroidism of renal origin: Secondary | ICD-10-CM | POA: Diagnosis not present

## 2015-11-13 DIAGNOSIS — Z23 Encounter for immunization: Secondary | ICD-10-CM | POA: Diagnosis not present

## 2015-11-13 DIAGNOSIS — N186 End stage renal disease: Secondary | ICD-10-CM | POA: Diagnosis not present

## 2015-11-15 DIAGNOSIS — N2581 Secondary hyperparathyroidism of renal origin: Secondary | ICD-10-CM | POA: Diagnosis not present

## 2015-11-15 DIAGNOSIS — Z23 Encounter for immunization: Secondary | ICD-10-CM | POA: Diagnosis not present

## 2015-11-15 DIAGNOSIS — D509 Iron deficiency anemia, unspecified: Secondary | ICD-10-CM | POA: Diagnosis not present

## 2015-11-15 DIAGNOSIS — N186 End stage renal disease: Secondary | ICD-10-CM | POA: Diagnosis not present

## 2015-11-15 DIAGNOSIS — D631 Anemia in chronic kidney disease: Secondary | ICD-10-CM | POA: Diagnosis not present

## 2015-11-17 DIAGNOSIS — N186 End stage renal disease: Secondary | ICD-10-CM | POA: Diagnosis not present

## 2015-11-17 DIAGNOSIS — N2581 Secondary hyperparathyroidism of renal origin: Secondary | ICD-10-CM | POA: Diagnosis not present

## 2015-11-17 DIAGNOSIS — D631 Anemia in chronic kidney disease: Secondary | ICD-10-CM | POA: Diagnosis not present

## 2015-11-17 DIAGNOSIS — Z23 Encounter for immunization: Secondary | ICD-10-CM | POA: Diagnosis not present

## 2015-11-17 DIAGNOSIS — D509 Iron deficiency anemia, unspecified: Secondary | ICD-10-CM | POA: Diagnosis not present

## 2015-11-20 DIAGNOSIS — Z23 Encounter for immunization: Secondary | ICD-10-CM | POA: Diagnosis not present

## 2015-11-20 DIAGNOSIS — D509 Iron deficiency anemia, unspecified: Secondary | ICD-10-CM | POA: Diagnosis not present

## 2015-11-20 DIAGNOSIS — N2581 Secondary hyperparathyroidism of renal origin: Secondary | ICD-10-CM | POA: Diagnosis not present

## 2015-11-20 DIAGNOSIS — N186 End stage renal disease: Secondary | ICD-10-CM | POA: Diagnosis not present

## 2015-11-20 DIAGNOSIS — D631 Anemia in chronic kidney disease: Secondary | ICD-10-CM | POA: Diagnosis not present

## 2015-11-22 DIAGNOSIS — N2581 Secondary hyperparathyroidism of renal origin: Secondary | ICD-10-CM | POA: Diagnosis not present

## 2015-11-22 DIAGNOSIS — D631 Anemia in chronic kidney disease: Secondary | ICD-10-CM | POA: Diagnosis not present

## 2015-11-22 DIAGNOSIS — Z23 Encounter for immunization: Secondary | ICD-10-CM | POA: Diagnosis not present

## 2015-11-22 DIAGNOSIS — D509 Iron deficiency anemia, unspecified: Secondary | ICD-10-CM | POA: Diagnosis not present

## 2015-11-22 DIAGNOSIS — N186 End stage renal disease: Secondary | ICD-10-CM | POA: Diagnosis not present

## 2015-11-24 DIAGNOSIS — D509 Iron deficiency anemia, unspecified: Secondary | ICD-10-CM | POA: Diagnosis not present

## 2015-11-24 DIAGNOSIS — Z23 Encounter for immunization: Secondary | ICD-10-CM | POA: Diagnosis not present

## 2015-11-24 DIAGNOSIS — D631 Anemia in chronic kidney disease: Secondary | ICD-10-CM | POA: Diagnosis not present

## 2015-11-24 DIAGNOSIS — N2581 Secondary hyperparathyroidism of renal origin: Secondary | ICD-10-CM | POA: Diagnosis not present

## 2015-11-24 DIAGNOSIS — N186 End stage renal disease: Secondary | ICD-10-CM | POA: Diagnosis not present

## 2015-11-27 ENCOUNTER — Other Ambulatory Visit: Payer: Self-pay | Admitting: *Deleted

## 2015-11-27 DIAGNOSIS — R911 Solitary pulmonary nodule: Secondary | ICD-10-CM

## 2015-11-27 DIAGNOSIS — D509 Iron deficiency anemia, unspecified: Secondary | ICD-10-CM | POA: Diagnosis not present

## 2015-11-27 DIAGNOSIS — Z23 Encounter for immunization: Secondary | ICD-10-CM | POA: Diagnosis not present

## 2015-11-27 DIAGNOSIS — N2581 Secondary hyperparathyroidism of renal origin: Secondary | ICD-10-CM | POA: Diagnosis not present

## 2015-11-27 DIAGNOSIS — N2889 Other specified disorders of kidney and ureter: Secondary | ICD-10-CM

## 2015-11-27 DIAGNOSIS — D631 Anemia in chronic kidney disease: Secondary | ICD-10-CM | POA: Diagnosis not present

## 2015-11-27 DIAGNOSIS — N186 End stage renal disease: Secondary | ICD-10-CM | POA: Diagnosis not present

## 2015-11-29 DIAGNOSIS — N186 End stage renal disease: Secondary | ICD-10-CM | POA: Diagnosis not present

## 2015-11-29 DIAGNOSIS — D509 Iron deficiency anemia, unspecified: Secondary | ICD-10-CM | POA: Diagnosis not present

## 2015-11-29 DIAGNOSIS — N2581 Secondary hyperparathyroidism of renal origin: Secondary | ICD-10-CM | POA: Diagnosis not present

## 2015-11-29 DIAGNOSIS — Z23 Encounter for immunization: Secondary | ICD-10-CM | POA: Diagnosis not present

## 2015-11-29 DIAGNOSIS — D631 Anemia in chronic kidney disease: Secondary | ICD-10-CM | POA: Diagnosis not present

## 2015-12-01 DIAGNOSIS — Z23 Encounter for immunization: Secondary | ICD-10-CM | POA: Diagnosis not present

## 2015-12-01 DIAGNOSIS — D631 Anemia in chronic kidney disease: Secondary | ICD-10-CM | POA: Diagnosis not present

## 2015-12-01 DIAGNOSIS — N186 End stage renal disease: Secondary | ICD-10-CM | POA: Diagnosis not present

## 2015-12-01 DIAGNOSIS — D509 Iron deficiency anemia, unspecified: Secondary | ICD-10-CM | POA: Diagnosis not present

## 2015-12-01 DIAGNOSIS — N2581 Secondary hyperparathyroidism of renal origin: Secondary | ICD-10-CM | POA: Diagnosis not present

## 2015-12-04 DIAGNOSIS — N2581 Secondary hyperparathyroidism of renal origin: Secondary | ICD-10-CM | POA: Diagnosis not present

## 2015-12-04 DIAGNOSIS — N186 End stage renal disease: Secondary | ICD-10-CM | POA: Diagnosis not present

## 2015-12-04 DIAGNOSIS — D509 Iron deficiency anemia, unspecified: Secondary | ICD-10-CM | POA: Diagnosis not present

## 2015-12-04 DIAGNOSIS — Z23 Encounter for immunization: Secondary | ICD-10-CM | POA: Diagnosis not present

## 2015-12-04 DIAGNOSIS — D631 Anemia in chronic kidney disease: Secondary | ICD-10-CM | POA: Diagnosis not present

## 2015-12-06 DIAGNOSIS — N186 End stage renal disease: Secondary | ICD-10-CM | POA: Diagnosis not present

## 2015-12-06 DIAGNOSIS — N2581 Secondary hyperparathyroidism of renal origin: Secondary | ICD-10-CM | POA: Diagnosis not present

## 2015-12-06 DIAGNOSIS — D509 Iron deficiency anemia, unspecified: Secondary | ICD-10-CM | POA: Diagnosis not present

## 2015-12-06 DIAGNOSIS — Z23 Encounter for immunization: Secondary | ICD-10-CM | POA: Diagnosis not present

## 2015-12-06 DIAGNOSIS — D631 Anemia in chronic kidney disease: Secondary | ICD-10-CM | POA: Diagnosis not present

## 2015-12-07 DIAGNOSIS — K5730 Diverticulosis of large intestine without perforation or abscess without bleeding: Secondary | ICD-10-CM | POA: Diagnosis not present

## 2015-12-07 DIAGNOSIS — M47815 Spondylosis without myelopathy or radiculopathy, thoracolumbar region: Secondary | ICD-10-CM | POA: Diagnosis not present

## 2015-12-07 DIAGNOSIS — R911 Solitary pulmonary nodule: Secondary | ICD-10-CM | POA: Diagnosis not present

## 2015-12-07 DIAGNOSIS — D3502 Benign neoplasm of left adrenal gland: Secondary | ICD-10-CM | POA: Diagnosis not present

## 2015-12-07 DIAGNOSIS — I2510 Atherosclerotic heart disease of native coronary artery without angina pectoris: Secondary | ICD-10-CM | POA: Diagnosis not present

## 2015-12-07 DIAGNOSIS — N281 Cyst of kidney, acquired: Secondary | ICD-10-CM | POA: Diagnosis not present

## 2015-12-08 DIAGNOSIS — N2581 Secondary hyperparathyroidism of renal origin: Secondary | ICD-10-CM | POA: Diagnosis not present

## 2015-12-08 DIAGNOSIS — N186 End stage renal disease: Secondary | ICD-10-CM | POA: Diagnosis not present

## 2015-12-08 DIAGNOSIS — Z23 Encounter for immunization: Secondary | ICD-10-CM | POA: Diagnosis not present

## 2015-12-08 DIAGNOSIS — D631 Anemia in chronic kidney disease: Secondary | ICD-10-CM | POA: Diagnosis not present

## 2015-12-08 DIAGNOSIS — D509 Iron deficiency anemia, unspecified: Secondary | ICD-10-CM | POA: Diagnosis not present

## 2015-12-10 DIAGNOSIS — N186 End stage renal disease: Secondary | ICD-10-CM | POA: Diagnosis not present

## 2015-12-10 DIAGNOSIS — Z992 Dependence on renal dialysis: Secondary | ICD-10-CM | POA: Diagnosis not present

## 2015-12-10 DIAGNOSIS — I120 Hypertensive chronic kidney disease with stage 5 chronic kidney disease or end stage renal disease: Secondary | ICD-10-CM | POA: Diagnosis not present

## 2015-12-11 DIAGNOSIS — D509 Iron deficiency anemia, unspecified: Secondary | ICD-10-CM | POA: Diagnosis not present

## 2015-12-11 DIAGNOSIS — N186 End stage renal disease: Secondary | ICD-10-CM | POA: Diagnosis not present

## 2015-12-11 DIAGNOSIS — N2581 Secondary hyperparathyroidism of renal origin: Secondary | ICD-10-CM | POA: Diagnosis not present

## 2015-12-11 DIAGNOSIS — D631 Anemia in chronic kidney disease: Secondary | ICD-10-CM | POA: Diagnosis not present

## 2015-12-13 DIAGNOSIS — N186 End stage renal disease: Secondary | ICD-10-CM | POA: Diagnosis not present

## 2015-12-13 DIAGNOSIS — D631 Anemia in chronic kidney disease: Secondary | ICD-10-CM | POA: Diagnosis not present

## 2015-12-13 DIAGNOSIS — N2581 Secondary hyperparathyroidism of renal origin: Secondary | ICD-10-CM | POA: Diagnosis not present

## 2015-12-13 DIAGNOSIS — D509 Iron deficiency anemia, unspecified: Secondary | ICD-10-CM | POA: Diagnosis not present

## 2015-12-14 ENCOUNTER — Ambulatory Visit
Admission: RE | Admit: 2015-12-14 | Discharge: 2015-12-14 | Disposition: A | Discharge status: Home / Self Care | Payer: Medicare Other | Source: Ambulatory Visit | Attending: Interventional Radiology | Admitting: Interventional Radiology

## 2015-12-14 DIAGNOSIS — N281 Cyst of kidney, acquired: Secondary | ICD-10-CM

## 2015-12-14 DIAGNOSIS — Q6100 Congenital renal cyst, unspecified: Secondary | ICD-10-CM | POA: Diagnosis not present

## 2015-12-14 HISTORY — PX: IR GENERIC HISTORICAL: IMG1180011

## 2015-12-14 NOTE — Progress Notes (Signed)
Chief Complaint: Follow up of hemorrhagic cyst of right kidney and right lower lung nodule.  History of Present Illness: Thomas Adams is a 55 y.o. male initially referred by Dr. Joie Bimler in June, 2015 to consider percutaneous ablation of a hyperdense right renal mass. This was followed by imaging and ultimately determined to likely represent a hemorrhagic cyst on the most recent imaging on 04/11/2014. He returns for follow-up imaging due to detection of an incidental 3 mm right lower lobe lung nodule on that study and to reimage the kidneys one additional time. He remains on dialysis.  Past Medical History  Diagnosis Date  . Hypertension   . CKD (chronic kidney disease), stage III   . Renal insufficiency     dialysis 08/2013, tuesday/thursday/saturday    Past Surgical History  Procedure Laterality Date  . Av fistula placement Left 09/22/2013    Procedure: ARTERIOVENOUS (AV) FISTULA CREATION RADIOCEPHALIC;  Surgeon: Elam Dutch, MD;  Location: [facility] OR;  Service: Vascular;  Laterality: Left;  . Removal of a dialysis catheter Right 09/22/2013    Procedure: REMOVAL OF A DIALYSIS CATHETER OF TEMPORARY RIGHT INTERNAL JUGULAR ;  Surgeon: Elam Dutch, MD;  Location: [place];  Service: Vascular;  Laterality: Right;  . Insertion of dialysis catheter Right 09/22/2013    Procedure: INSERTION OF DIALYSIS CATHETER; ULTRASOUND GUIDED;  Surgeon: Elam Dutch, MD;  Location: [place];  Service: Vascular;  Laterality: Right;    Allergies: Penicillins  Medications: Prior to Admission medications   Medication Sig Start Date End Date Taking? Authorizing Provider  amLODipine (NORVASC) 10 MG tablet Take 1 tablet (10 mg total) by mouth daily. 09/23/13  Yes Delfina Redwood, MD  cetirizine (ZYRTEC) 10 MG tablet Take 10 mg by mouth daily as needed for allergies.   Yes Historical Provider, MD  cinacalcet (SENSIPAR) 30 MG tablet Take 30 mg by mouth daily as needed.   Yes Historical Provider, MD    hydrALAZINE (APRESOLINE) 25 MG tablet Take 1 tablet (25 mg total) by mouth every 8 (eight) hours. 09/23/13  Yes Delfina Redwood, MD  labetalol (NORMODYNE) 200 MG tablet Take 1 tablet (200 mg total) by mouth 2 (two) times daily. 09/23/13  Yes Delfina Redwood, MD  acetaminophen (TYLENOL) 325 MG tablet Take 2 tablets (650 mg total) by mouth every 6 (six) hours as needed for mild pain (or Fever >/= 101). Patient not taking: Reported on 12/14/2015 09/23/13   Delfina Redwood, MD  omeprazole (PRILOSEC) 20 MG capsule Take 20 mg by mouth 2 (two) times daily as needed (for indigestion). Reported on 12/14/2015    Historical Provider, MD  oxyCODONE-acetaminophen (PERCOCET/ROXICET) 5-325 MG per tablet Take 1 tablet by mouth every 8 (eight) hours as needed for severe pain. Patient not taking: Reported on 12/14/2015 09/23/13   Delfina Redwood, MD     Family History  Problem Relation Age of Onset  . Hypertension Father     Social History   Social History  . Marital Status: Divorced    Spouse Name: N/A  . Number of Children: N/A  . Years of Education: N/A   Social History Main Topics  . Smoking status: Never Smoker   . Smokeless tobacco: Not on file  . Alcohol Use: No  . Drug Use: Not on file  . Sexual Activity: Not on file   Other Topics Concern  . Not on file   Social History Narrative  . No narrative on file  Review of Systems: A 12 point ROS discussed and pertinent positives are indicated in the HPI above.  All other systems are negative.  Review of Systems  Constitutional: Negative.   Respiratory: Negative.   Cardiovascular: Negative.   Gastrointestinal: Negative.   Genitourinary: Negative.   Musculoskeletal: Negative.   Neurological: Negative.     Vital Signs: BP 209/108 mmHg  Pulse 71  Temp(Src) 98.3 F (36.8 C) (Oral)  Resp 15  Ht 5\' 9"  (1.753 m)  Wt 206 lb 11.2 oz (93.759 kg)  BMI 30.51 kg/m2  SpO2 100%  Physical Exam  Constitutional: He is oriented to  person, place, and time. He appears well-developed and well-nourished. No distress.  Abdominal: Soft. He exhibits no distension and no mass. There is no tenderness. There is no rebound and no guarding.  Musculoskeletal: He exhibits no edema.  Neurological: He is alert and oriented to person, place, and time.  Skin: He is not diaphoretic.  Nursing note and vitals reviewed.    Imaging: No results found.  Assessment and Plan:  I reviewed follow-up CT of the chest and abdomen performed without contrast at [facility] on 12/07/2015. This demonstrates a stable tiny, roughly 3 mm, nodule in the periphery of the right lower lobe. The hemorrhagic cyst of the anterior lower right kidney now shows near-complete involution and measures approximately 5 mm. No new renal lesions are detected by unenhanced CT. I recommended that we cease any further imaging of the kidneys unless there are new indications to do so. The tiny lung nodule is stable and benign.  Electronically SignedAletta Edouard T 12/14/2015, 4:15 PM   I spent a total of 15 Minutes in face to face in clinical consultation, greater than 50% of which was counseling/coordinating care for a right renal hemorrhagic cyst and right lower lobe lung nodule.

## 2015-12-15 DIAGNOSIS — D509 Iron deficiency anemia, unspecified: Secondary | ICD-10-CM | POA: Diagnosis not present

## 2015-12-15 DIAGNOSIS — N186 End stage renal disease: Secondary | ICD-10-CM | POA: Diagnosis not present

## 2015-12-15 DIAGNOSIS — D631 Anemia in chronic kidney disease: Secondary | ICD-10-CM | POA: Diagnosis not present

## 2015-12-15 DIAGNOSIS — N2581 Secondary hyperparathyroidism of renal origin: Secondary | ICD-10-CM | POA: Diagnosis not present

## 2015-12-18 DIAGNOSIS — D631 Anemia in chronic kidney disease: Secondary | ICD-10-CM | POA: Diagnosis not present

## 2015-12-18 DIAGNOSIS — N186 End stage renal disease: Secondary | ICD-10-CM | POA: Diagnosis not present

## 2015-12-18 DIAGNOSIS — N2581 Secondary hyperparathyroidism of renal origin: Secondary | ICD-10-CM | POA: Diagnosis not present

## 2015-12-18 DIAGNOSIS — D509 Iron deficiency anemia, unspecified: Secondary | ICD-10-CM | POA: Diagnosis not present

## 2015-12-20 DIAGNOSIS — D509 Iron deficiency anemia, unspecified: Secondary | ICD-10-CM | POA: Diagnosis not present

## 2015-12-20 DIAGNOSIS — D631 Anemia in chronic kidney disease: Secondary | ICD-10-CM | POA: Diagnosis not present

## 2015-12-20 DIAGNOSIS — N186 End stage renal disease: Secondary | ICD-10-CM | POA: Diagnosis not present

## 2015-12-20 DIAGNOSIS — N2581 Secondary hyperparathyroidism of renal origin: Secondary | ICD-10-CM | POA: Diagnosis not present

## 2015-12-22 DIAGNOSIS — N2581 Secondary hyperparathyroidism of renal origin: Secondary | ICD-10-CM | POA: Diagnosis not present

## 2015-12-22 DIAGNOSIS — D509 Iron deficiency anemia, unspecified: Secondary | ICD-10-CM | POA: Diagnosis not present

## 2015-12-22 DIAGNOSIS — D631 Anemia in chronic kidney disease: Secondary | ICD-10-CM | POA: Diagnosis not present

## 2015-12-22 DIAGNOSIS — N186 End stage renal disease: Secondary | ICD-10-CM | POA: Diagnosis not present

## 2015-12-25 DIAGNOSIS — D631 Anemia in chronic kidney disease: Secondary | ICD-10-CM | POA: Diagnosis not present

## 2015-12-25 DIAGNOSIS — N2581 Secondary hyperparathyroidism of renal origin: Secondary | ICD-10-CM | POA: Diagnosis not present

## 2015-12-25 DIAGNOSIS — N186 End stage renal disease: Secondary | ICD-10-CM | POA: Diagnosis not present

## 2015-12-25 DIAGNOSIS — D509 Iron deficiency anemia, unspecified: Secondary | ICD-10-CM | POA: Diagnosis not present

## 2015-12-27 DIAGNOSIS — N2581 Secondary hyperparathyroidism of renal origin: Secondary | ICD-10-CM | POA: Diagnosis not present

## 2015-12-27 DIAGNOSIS — N186 End stage renal disease: Secondary | ICD-10-CM | POA: Diagnosis not present

## 2015-12-27 DIAGNOSIS — D509 Iron deficiency anemia, unspecified: Secondary | ICD-10-CM | POA: Diagnosis not present

## 2015-12-27 DIAGNOSIS — D631 Anemia in chronic kidney disease: Secondary | ICD-10-CM | POA: Diagnosis not present

## 2015-12-29 DIAGNOSIS — D509 Iron deficiency anemia, unspecified: Secondary | ICD-10-CM | POA: Diagnosis not present

## 2015-12-29 DIAGNOSIS — D631 Anemia in chronic kidney disease: Secondary | ICD-10-CM | POA: Diagnosis not present

## 2015-12-29 DIAGNOSIS — N186 End stage renal disease: Secondary | ICD-10-CM | POA: Diagnosis not present

## 2015-12-29 DIAGNOSIS — N2581 Secondary hyperparathyroidism of renal origin: Secondary | ICD-10-CM | POA: Diagnosis not present

## 2016-01-01 DIAGNOSIS — D631 Anemia in chronic kidney disease: Secondary | ICD-10-CM | POA: Diagnosis not present

## 2016-01-01 DIAGNOSIS — N2581 Secondary hyperparathyroidism of renal origin: Secondary | ICD-10-CM | POA: Diagnosis not present

## 2016-01-01 DIAGNOSIS — N186 End stage renal disease: Secondary | ICD-10-CM | POA: Diagnosis not present

## 2016-01-01 DIAGNOSIS — D509 Iron deficiency anemia, unspecified: Secondary | ICD-10-CM | POA: Diagnosis not present

## 2016-01-03 DIAGNOSIS — N2581 Secondary hyperparathyroidism of renal origin: Secondary | ICD-10-CM | POA: Diagnosis not present

## 2016-01-03 DIAGNOSIS — D631 Anemia in chronic kidney disease: Secondary | ICD-10-CM | POA: Diagnosis not present

## 2016-01-03 DIAGNOSIS — D509 Iron deficiency anemia, unspecified: Secondary | ICD-10-CM | POA: Diagnosis not present

## 2016-01-03 DIAGNOSIS — N186 End stage renal disease: Secondary | ICD-10-CM | POA: Diagnosis not present

## 2016-01-05 DIAGNOSIS — N2581 Secondary hyperparathyroidism of renal origin: Secondary | ICD-10-CM | POA: Diagnosis not present

## 2016-01-05 DIAGNOSIS — N186 End stage renal disease: Secondary | ICD-10-CM | POA: Diagnosis not present

## 2016-01-05 DIAGNOSIS — D631 Anemia in chronic kidney disease: Secondary | ICD-10-CM | POA: Diagnosis not present

## 2016-01-05 DIAGNOSIS — D509 Iron deficiency anemia, unspecified: Secondary | ICD-10-CM | POA: Diagnosis not present

## 2016-01-08 DIAGNOSIS — D631 Anemia in chronic kidney disease: Secondary | ICD-10-CM | POA: Diagnosis not present

## 2016-01-08 DIAGNOSIS — N2581 Secondary hyperparathyroidism of renal origin: Secondary | ICD-10-CM | POA: Diagnosis not present

## 2016-01-08 DIAGNOSIS — D509 Iron deficiency anemia, unspecified: Secondary | ICD-10-CM | POA: Diagnosis not present

## 2016-01-08 DIAGNOSIS — N186 End stage renal disease: Secondary | ICD-10-CM | POA: Diagnosis not present

## 2016-01-10 DIAGNOSIS — D509 Iron deficiency anemia, unspecified: Secondary | ICD-10-CM | POA: Diagnosis not present

## 2016-01-10 DIAGNOSIS — D631 Anemia in chronic kidney disease: Secondary | ICD-10-CM | POA: Diagnosis not present

## 2016-01-10 DIAGNOSIS — I120 Hypertensive chronic kidney disease with stage 5 chronic kidney disease or end stage renal disease: Secondary | ICD-10-CM | POA: Diagnosis not present

## 2016-01-10 DIAGNOSIS — Z992 Dependence on renal dialysis: Secondary | ICD-10-CM | POA: Diagnosis not present

## 2016-01-10 DIAGNOSIS — N186 End stage renal disease: Secondary | ICD-10-CM | POA: Diagnosis not present

## 2016-01-10 DIAGNOSIS — N2581 Secondary hyperparathyroidism of renal origin: Secondary | ICD-10-CM | POA: Diagnosis not present

## 2016-01-12 DIAGNOSIS — N186 End stage renal disease: Secondary | ICD-10-CM | POA: Diagnosis not present

## 2016-01-12 DIAGNOSIS — D631 Anemia in chronic kidney disease: Secondary | ICD-10-CM | POA: Diagnosis not present

## 2016-01-12 DIAGNOSIS — N2581 Secondary hyperparathyroidism of renal origin: Secondary | ICD-10-CM | POA: Diagnosis not present

## 2016-01-15 DIAGNOSIS — N186 End stage renal disease: Secondary | ICD-10-CM | POA: Diagnosis not present

## 2016-01-15 DIAGNOSIS — D631 Anemia in chronic kidney disease: Secondary | ICD-10-CM | POA: Diagnosis not present

## 2016-01-15 DIAGNOSIS — N2581 Secondary hyperparathyroidism of renal origin: Secondary | ICD-10-CM | POA: Diagnosis not present

## 2016-01-17 DIAGNOSIS — D631 Anemia in chronic kidney disease: Secondary | ICD-10-CM | POA: Diagnosis not present

## 2016-01-17 DIAGNOSIS — N2581 Secondary hyperparathyroidism of renal origin: Secondary | ICD-10-CM | POA: Diagnosis not present

## 2016-01-17 DIAGNOSIS — N186 End stage renal disease: Secondary | ICD-10-CM | POA: Diagnosis not present

## 2016-01-19 DIAGNOSIS — N186 End stage renal disease: Secondary | ICD-10-CM | POA: Diagnosis not present

## 2016-01-19 DIAGNOSIS — D631 Anemia in chronic kidney disease: Secondary | ICD-10-CM | POA: Diagnosis not present

## 2016-01-19 DIAGNOSIS — N2581 Secondary hyperparathyroidism of renal origin: Secondary | ICD-10-CM | POA: Diagnosis not present

## 2016-01-22 DIAGNOSIS — N186 End stage renal disease: Secondary | ICD-10-CM | POA: Diagnosis not present

## 2016-01-22 DIAGNOSIS — N2581 Secondary hyperparathyroidism of renal origin: Secondary | ICD-10-CM | POA: Diagnosis not present

## 2016-01-22 DIAGNOSIS — D631 Anemia in chronic kidney disease: Secondary | ICD-10-CM | POA: Diagnosis not present

## 2016-01-24 DIAGNOSIS — N2581 Secondary hyperparathyroidism of renal origin: Secondary | ICD-10-CM | POA: Diagnosis not present

## 2016-01-24 DIAGNOSIS — D631 Anemia in chronic kidney disease: Secondary | ICD-10-CM | POA: Diagnosis not present

## 2016-01-24 DIAGNOSIS — N186 End stage renal disease: Secondary | ICD-10-CM | POA: Diagnosis not present

## 2016-01-26 DIAGNOSIS — N2581 Secondary hyperparathyroidism of renal origin: Secondary | ICD-10-CM | POA: Diagnosis not present

## 2016-01-26 DIAGNOSIS — D631 Anemia in chronic kidney disease: Secondary | ICD-10-CM | POA: Diagnosis not present

## 2016-01-26 DIAGNOSIS — N186 End stage renal disease: Secondary | ICD-10-CM | POA: Diagnosis not present

## 2016-01-29 DIAGNOSIS — N186 End stage renal disease: Secondary | ICD-10-CM | POA: Diagnosis not present

## 2016-01-29 DIAGNOSIS — D631 Anemia in chronic kidney disease: Secondary | ICD-10-CM | POA: Diagnosis not present

## 2016-01-29 DIAGNOSIS — N2581 Secondary hyperparathyroidism of renal origin: Secondary | ICD-10-CM | POA: Diagnosis not present

## 2016-01-31 DIAGNOSIS — N186 End stage renal disease: Secondary | ICD-10-CM | POA: Diagnosis not present

## 2016-01-31 DIAGNOSIS — D631 Anemia in chronic kidney disease: Secondary | ICD-10-CM | POA: Diagnosis not present

## 2016-01-31 DIAGNOSIS — N2581 Secondary hyperparathyroidism of renal origin: Secondary | ICD-10-CM | POA: Diagnosis not present

## 2016-02-02 DIAGNOSIS — D631 Anemia in chronic kidney disease: Secondary | ICD-10-CM | POA: Diagnosis not present

## 2016-02-02 DIAGNOSIS — N2581 Secondary hyperparathyroidism of renal origin: Secondary | ICD-10-CM | POA: Diagnosis not present

## 2016-02-02 DIAGNOSIS — N186 End stage renal disease: Secondary | ICD-10-CM | POA: Diagnosis not present

## 2016-02-05 DIAGNOSIS — N186 End stage renal disease: Secondary | ICD-10-CM | POA: Diagnosis not present

## 2016-02-05 DIAGNOSIS — N2581 Secondary hyperparathyroidism of renal origin: Secondary | ICD-10-CM | POA: Diagnosis not present

## 2016-02-05 DIAGNOSIS — D631 Anemia in chronic kidney disease: Secondary | ICD-10-CM | POA: Diagnosis not present

## 2016-02-07 DIAGNOSIS — N186 End stage renal disease: Secondary | ICD-10-CM | POA: Diagnosis not present

## 2016-02-07 DIAGNOSIS — D631 Anemia in chronic kidney disease: Secondary | ICD-10-CM | POA: Diagnosis not present

## 2016-02-07 DIAGNOSIS — N2581 Secondary hyperparathyroidism of renal origin: Secondary | ICD-10-CM | POA: Diagnosis not present

## 2016-02-08 DIAGNOSIS — H04123 Dry eye syndrome of bilateral lacrimal glands: Secondary | ICD-10-CM | POA: Diagnosis not present

## 2016-02-08 DIAGNOSIS — H2513 Age-related nuclear cataract, bilateral: Secondary | ICD-10-CM | POA: Diagnosis not present

## 2016-02-09 DIAGNOSIS — D631 Anemia in chronic kidney disease: Secondary | ICD-10-CM | POA: Diagnosis not present

## 2016-02-09 DIAGNOSIS — N2581 Secondary hyperparathyroidism of renal origin: Secondary | ICD-10-CM | POA: Diagnosis not present

## 2016-02-09 DIAGNOSIS — N186 End stage renal disease: Secondary | ICD-10-CM | POA: Diagnosis not present

## 2016-02-09 DIAGNOSIS — I120 Hypertensive chronic kidney disease with stage 5 chronic kidney disease or end stage renal disease: Secondary | ICD-10-CM | POA: Diagnosis not present

## 2016-02-09 DIAGNOSIS — Z992 Dependence on renal dialysis: Secondary | ICD-10-CM | POA: Diagnosis not present

## 2016-02-12 DIAGNOSIS — D631 Anemia in chronic kidney disease: Secondary | ICD-10-CM | POA: Diagnosis not present

## 2016-02-12 DIAGNOSIS — N2581 Secondary hyperparathyroidism of renal origin: Secondary | ICD-10-CM | POA: Diagnosis not present

## 2016-02-12 DIAGNOSIS — D509 Iron deficiency anemia, unspecified: Secondary | ICD-10-CM | POA: Diagnosis not present

## 2016-02-12 DIAGNOSIS — N186 End stage renal disease: Secondary | ICD-10-CM | POA: Diagnosis not present

## 2016-02-14 DIAGNOSIS — D509 Iron deficiency anemia, unspecified: Secondary | ICD-10-CM | POA: Diagnosis not present

## 2016-02-14 DIAGNOSIS — N2581 Secondary hyperparathyroidism of renal origin: Secondary | ICD-10-CM | POA: Diagnosis not present

## 2016-02-14 DIAGNOSIS — D631 Anemia in chronic kidney disease: Secondary | ICD-10-CM | POA: Diagnosis not present

## 2016-02-14 DIAGNOSIS — N186 End stage renal disease: Secondary | ICD-10-CM | POA: Diagnosis not present

## 2016-02-16 DIAGNOSIS — D631 Anemia in chronic kidney disease: Secondary | ICD-10-CM | POA: Diagnosis not present

## 2016-02-16 DIAGNOSIS — N2581 Secondary hyperparathyroidism of renal origin: Secondary | ICD-10-CM | POA: Diagnosis not present

## 2016-02-16 DIAGNOSIS — N186 End stage renal disease: Secondary | ICD-10-CM | POA: Diagnosis not present

## 2016-02-16 DIAGNOSIS — D509 Iron deficiency anemia, unspecified: Secondary | ICD-10-CM | POA: Diagnosis not present

## 2016-02-19 DIAGNOSIS — D509 Iron deficiency anemia, unspecified: Secondary | ICD-10-CM | POA: Diagnosis not present

## 2016-02-19 DIAGNOSIS — D631 Anemia in chronic kidney disease: Secondary | ICD-10-CM | POA: Diagnosis not present

## 2016-02-19 DIAGNOSIS — N186 End stage renal disease: Secondary | ICD-10-CM | POA: Diagnosis not present

## 2016-02-19 DIAGNOSIS — N2581 Secondary hyperparathyroidism of renal origin: Secondary | ICD-10-CM | POA: Diagnosis not present

## 2016-02-21 DIAGNOSIS — D631 Anemia in chronic kidney disease: Secondary | ICD-10-CM | POA: Diagnosis not present

## 2016-02-21 DIAGNOSIS — N186 End stage renal disease: Secondary | ICD-10-CM | POA: Diagnosis not present

## 2016-02-21 DIAGNOSIS — D509 Iron deficiency anemia, unspecified: Secondary | ICD-10-CM | POA: Diagnosis not present

## 2016-02-21 DIAGNOSIS — N2581 Secondary hyperparathyroidism of renal origin: Secondary | ICD-10-CM | POA: Diagnosis not present

## 2016-02-23 DIAGNOSIS — N2581 Secondary hyperparathyroidism of renal origin: Secondary | ICD-10-CM | POA: Diagnosis not present

## 2016-02-23 DIAGNOSIS — N186 End stage renal disease: Secondary | ICD-10-CM | POA: Diagnosis not present

## 2016-02-23 DIAGNOSIS — D509 Iron deficiency anemia, unspecified: Secondary | ICD-10-CM | POA: Diagnosis not present

## 2016-02-23 DIAGNOSIS — D631 Anemia in chronic kidney disease: Secondary | ICD-10-CM | POA: Diagnosis not present

## 2016-02-26 DIAGNOSIS — N2581 Secondary hyperparathyroidism of renal origin: Secondary | ICD-10-CM | POA: Diagnosis not present

## 2016-02-26 DIAGNOSIS — D631 Anemia in chronic kidney disease: Secondary | ICD-10-CM | POA: Diagnosis not present

## 2016-02-26 DIAGNOSIS — N186 End stage renal disease: Secondary | ICD-10-CM | POA: Diagnosis not present

## 2016-02-26 DIAGNOSIS — D509 Iron deficiency anemia, unspecified: Secondary | ICD-10-CM | POA: Diagnosis not present

## 2016-02-28 DIAGNOSIS — N186 End stage renal disease: Secondary | ICD-10-CM | POA: Diagnosis not present

## 2016-02-28 DIAGNOSIS — N2581 Secondary hyperparathyroidism of renal origin: Secondary | ICD-10-CM | POA: Diagnosis not present

## 2016-02-28 DIAGNOSIS — D631 Anemia in chronic kidney disease: Secondary | ICD-10-CM | POA: Diagnosis not present

## 2016-02-28 DIAGNOSIS — D509 Iron deficiency anemia, unspecified: Secondary | ICD-10-CM | POA: Diagnosis not present

## 2016-03-01 DIAGNOSIS — D509 Iron deficiency anemia, unspecified: Secondary | ICD-10-CM | POA: Diagnosis not present

## 2016-03-01 DIAGNOSIS — N186 End stage renal disease: Secondary | ICD-10-CM | POA: Diagnosis not present

## 2016-03-01 DIAGNOSIS — N2581 Secondary hyperparathyroidism of renal origin: Secondary | ICD-10-CM | POA: Diagnosis not present

## 2016-03-01 DIAGNOSIS — D631 Anemia in chronic kidney disease: Secondary | ICD-10-CM | POA: Diagnosis not present

## 2016-03-04 ENCOUNTER — Inpatient Hospital Stay (HOSPITAL_COMMUNITY)
Admission: EM | Admit: 2016-03-04 | Discharge: 2016-03-05 | DRG: 308 | Disposition: A | Discharge status: Home / Self Care | Payer: Medicare Other | Attending: Internal Medicine | Admitting: Internal Medicine

## 2016-03-04 ENCOUNTER — Encounter (HOSPITAL_COMMUNITY): Payer: Self-pay

## 2016-03-04 ENCOUNTER — Emergency Department (HOSPITAL_COMMUNITY): Payer: Medicare Other

## 2016-03-04 DIAGNOSIS — I4891 Unspecified atrial fibrillation: Secondary | ICD-10-CM | POA: Diagnosis present

## 2016-03-04 DIAGNOSIS — N186 End stage renal disease: Secondary | ICD-10-CM | POA: Diagnosis not present

## 2016-03-04 DIAGNOSIS — E876 Hypokalemia: Secondary | ICD-10-CM | POA: Diagnosis not present

## 2016-03-04 DIAGNOSIS — I10 Essential (primary) hypertension: Secondary | ICD-10-CM | POA: Diagnosis present

## 2016-03-04 DIAGNOSIS — I120 Hypertensive chronic kidney disease with stage 5 chronic kidney disease or end stage renal disease: Secondary | ICD-10-CM | POA: Diagnosis not present

## 2016-03-04 DIAGNOSIS — D638 Anemia in other chronic diseases classified elsewhere: Secondary | ICD-10-CM | POA: Diagnosis not present

## 2016-03-04 DIAGNOSIS — I248 Other forms of acute ischemic heart disease: Secondary | ICD-10-CM | POA: Diagnosis not present

## 2016-03-04 DIAGNOSIS — I499 Cardiac arrhythmia, unspecified: Secondary | ICD-10-CM | POA: Diagnosis not present

## 2016-03-04 DIAGNOSIS — Z79899 Other long term (current) drug therapy: Secondary | ICD-10-CM | POA: Diagnosis not present

## 2016-03-04 DIAGNOSIS — I471 Supraventricular tachycardia: Secondary | ICD-10-CM | POA: Diagnosis not present

## 2016-03-04 DIAGNOSIS — I480 Paroxysmal atrial fibrillation: Secondary | ICD-10-CM | POA: Diagnosis not present

## 2016-03-04 DIAGNOSIS — Z992 Dependence on renal dialysis: Secondary | ICD-10-CM

## 2016-03-04 DIAGNOSIS — D631 Anemia in chronic kidney disease: Secondary | ICD-10-CM | POA: Diagnosis not present

## 2016-03-04 DIAGNOSIS — N2581 Secondary hyperparathyroidism of renal origin: Secondary | ICD-10-CM | POA: Diagnosis not present

## 2016-03-04 DIAGNOSIS — R002 Palpitations: Secondary | ICD-10-CM | POA: Diagnosis not present

## 2016-03-04 DIAGNOSIS — D509 Iron deficiency anemia, unspecified: Secondary | ICD-10-CM | POA: Diagnosis not present

## 2016-03-04 DIAGNOSIS — E872 Acidosis: Secondary | ICD-10-CM | POA: Diagnosis present

## 2016-03-04 HISTORY — DX: Adverse effect of unspecified anesthetic, initial encounter: T41.45XA

## 2016-03-04 HISTORY — DX: Other complications of anesthesia, initial encounter: T88.59XA

## 2016-03-04 LAB — CBC WITH DIFFERENTIAL/PLATELET
BASOS PCT: 0 %
Basophils Absolute: 0 10*3/uL (ref 0.0–0.1)
Eosinophils Absolute: 0 10*3/uL (ref 0.0–0.7)
Eosinophils Relative: 1 %
HEMATOCRIT: 23.9 % — AB (ref 39.0–52.0)
HEMOGLOBIN: 7.8 g/dL — AB (ref 13.0–17.0)
LYMPHS ABS: 1.5 10*3/uL (ref 0.7–4.0)
Lymphocytes Relative: 28 %
MCH: 31.5 pg (ref 26.0–34.0)
MCHC: 32.6 g/dL (ref 30.0–36.0)
MCV: 96.4 fL (ref 78.0–100.0)
MONO ABS: 0.3 10*3/uL (ref 0.1–1.0)
MONOS PCT: 6 %
NEUTROS ABS: 3.5 10*3/uL (ref 1.7–7.7)
Neutrophils Relative %: 65 %
Platelets: 124 10*3/uL — ABNORMAL LOW (ref 150–400)
RBC: 2.48 MIL/uL — ABNORMAL LOW (ref 4.22–5.81)
RDW: 15 % (ref 11.5–15.5)
WBC: 5.4 10*3/uL (ref 4.0–10.5)

## 2016-03-04 LAB — COMPREHENSIVE METABOLIC PANEL
ALBUMIN: 4.4 g/dL (ref 3.5–5.0)
ALT: 11 U/L — ABNORMAL LOW (ref 17–63)
AST: 15 U/L (ref 15–41)
Alkaline Phosphatase: 81 U/L (ref 38–126)
Anion gap: 13 (ref 5–15)
BUN: 13 mg/dL (ref 6–20)
CHLORIDE: 98 mmol/L — AB (ref 101–111)
CO2: 29 mmol/L (ref 22–32)
Calcium: 9 mg/dL (ref 8.9–10.3)
Creatinine, Ser: 5.41 mg/dL — ABNORMAL HIGH (ref 0.61–1.24)
GFR calc Af Amer: 13 mL/min — ABNORMAL LOW (ref >60)
GFR calc non Af Amer: 11 mL/min — ABNORMAL LOW (ref >60)
GLUCOSE: 79 mg/dL (ref 65–99)
POTASSIUM: 3.5 mmol/L (ref 3.5–5.1)
SODIUM: 140 mmol/L (ref 135–145)
Total Bilirubin: 0.4 mg/dL (ref 0.3–1.2)
Total Protein: 7.5 g/dL (ref 6.5–8.1)

## 2016-03-04 LAB — TROPONIN I
Troponin I: 0.18 ng/mL (ref <0.03)
Troponin I: 0.15 ng/mL (ref <0.03)

## 2016-03-04 LAB — MAGNESIUM
MAGNESIUM: 1.2 mg/dL — AB (ref 1.7–2.4)
MAGNESIUM: 2 mg/dL (ref 1.7–2.4)

## 2016-03-04 LAB — I-STAT CHEM 8, ED
BUN: 8 mg/dL (ref 6–20)
CALCIUM ION: 0.75 mmol/L — AB (ref 1.13–1.30)
CHLORIDE: 108 mmol/L (ref 101–111)
CREATININE: 3.3 mg/dL — AB (ref 0.61–1.24)
Glucose, Bld: 55 mg/dL — ABNORMAL LOW (ref 65–99)
HEMATOCRIT: 23 % — AB (ref 39.0–52.0)
Hemoglobin: 7.8 g/dL — ABNORMAL LOW (ref 13.0–17.0)
Potassium: 2.1 mmol/L — CL (ref 3.5–5.1)
Sodium: 148 mmol/L — ABNORMAL HIGH (ref 135–145)
TCO2: 22 mmol/L (ref 0–100)

## 2016-03-04 LAB — BASIC METABOLIC PANEL
ANION GAP: 5 (ref 5–15)
BUN: 8 mg/dL (ref 6–20)
CO2: 22 mmol/L (ref 22–32)
CREATININE: 3.18 mg/dL — AB (ref 0.61–1.24)
Calcium: 5.4 mg/dL — CL (ref 8.9–10.3)
Chloride: 115 mmol/L — ABNORMAL HIGH (ref 101–111)
GFR, EST AFRICAN AMERICAN: 24 mL/min — AB (ref >60)
GFR, EST NON AFRICAN AMERICAN: 21 mL/min — AB (ref >60)
GLUCOSE: 58 mg/dL — AB (ref 65–99)
POTASSIUM: 2 mmol/L — AB (ref 3.5–5.1)
SODIUM: 142 mmol/L (ref 135–145)

## 2016-03-04 LAB — PHOSPHORUS: Phosphorus: 3.3 mg/dL (ref 2.5–4.6)

## 2016-03-04 LAB — TSH: TSH: 2.118 u[IU]/mL (ref 0.350–4.500)

## 2016-03-04 MED ORDER — HYDRALAZINE HCL 25 MG PO TABS
100.0000 mg | ORAL_TABLET | Freq: Three times a day (TID) | ORAL | Status: DC
  Administered 2016-03-04 – 2016-03-05 (×4): 100 mg via ORAL
  Filled 2016-03-04 (×4): qty 4

## 2016-03-04 MED ORDER — POTASSIUM CHLORIDE 10 MEQ/100ML IV SOLN
10.0000 meq | INTRAVENOUS | Status: AC
  Administered 2016-03-04 (×2): 10 meq via INTRAVENOUS
  Filled 2016-03-04 (×2): qty 100

## 2016-03-04 MED ORDER — CALCIUM GLUCONATE 10 % IV SOLN
1.0000 g | Freq: Once | INTRAVENOUS | Status: DC
  Filled 2016-03-04: qty 10

## 2016-03-04 MED ORDER — POTASSIUM CHLORIDE CRYS ER 20 MEQ PO TBCR
40.0000 meq | EXTENDED_RELEASE_TABLET | Freq: Once | ORAL | Status: DC
  Filled 2016-03-04: qty 2

## 2016-03-04 MED ORDER — LISINOPRIL 20 MG PO TABS
20.0000 mg | ORAL_TABLET | Freq: Every day | ORAL | Status: DC
  Administered 2016-03-05: 20 mg via ORAL
  Filled 2016-03-04 (×2): qty 1

## 2016-03-04 MED ORDER — OFF THE BEAT BOOK
Freq: Once | Status: AC
  Administered 2016-03-04: 20:00:00
  Filled 2016-03-04: qty 1

## 2016-03-04 MED ORDER — MAGNESIUM SULFATE 2 GM/50ML IV SOLN: 2.0000 g | Freq: Once | INTRAVENOUS | Status: DC

## 2016-03-04 MED ORDER — ACETAMINOPHEN 325 MG PO TABS: 650.0000 mg | ORAL_TABLET | Freq: Four times a day (QID) | ORAL | Status: DC | PRN

## 2016-03-04 MED ORDER — SENNOSIDES-DOCUSATE SODIUM 8.6-50 MG PO TABS: 1.0000 | ORAL_TABLET | Freq: Every evening | ORAL | Status: DC | PRN

## 2016-03-04 MED ORDER — HEPARIN SODIUM (PORCINE) 5000 UNIT/ML IJ SOLN
5000.0000 [IU] | Freq: Three times a day (TID) | INTRAMUSCULAR | Status: DC
  Administered 2016-03-04 – 2016-03-05 (×2): 5000 [IU] via SUBCUTANEOUS
  Filled 2016-03-04 (×3): qty 1

## 2016-03-04 MED ORDER — SODIUM CHLORIDE 0.9% FLUSH
3.0000 mL | Freq: Two times a day (BID) | INTRAVENOUS | Status: DC
  Administered 2016-03-04 – 2016-03-05 (×2): 3 mL via INTRAVENOUS

## 2016-03-04 MED ORDER — ACETAMINOPHEN 650 MG RE SUPP: 650.0000 mg | Freq: Four times a day (QID) | RECTAL | Status: DC | PRN

## 2016-03-04 MED ORDER — DEXTROSE 5 % IV SOLN
5.0000 mg/h | INTRAVENOUS | Status: DC
  Administered 2016-03-04: 5 mg/h via INTRAVENOUS
  Filled 2016-03-04: qty 100

## 2016-03-04 MED ORDER — SEVELAMER CARBONATE 800 MG PO TABS
800.0000 mg | ORAL_TABLET | Freq: Three times a day (TID) | ORAL | Status: DC
  Administered 2016-03-04 – 2016-03-05 (×2): 800 mg via ORAL
  Filled 2016-03-04 (×3): qty 1

## 2016-03-04 MED ORDER — DILTIAZEM LOAD VIA INFUSION
20.0000 mg | Freq: Once | INTRAVENOUS | Status: AC
  Administered 2016-03-04: 20 mg via INTRAVENOUS
  Filled 2016-03-04: qty 20

## 2016-03-04 MED ORDER — LABETALOL HCL 200 MG PO TABS
200.0000 mg | ORAL_TABLET | Freq: Two times a day (BID) | ORAL | Status: DC
  Administered 2016-03-04 – 2016-03-05 (×2): 200 mg via ORAL
  Filled 2016-03-04 (×2): qty 1

## 2016-03-04 NOTE — ED Notes (Signed)
Pt given [place] sandwich and drink per MD order

## 2016-03-04 NOTE — H&P (Signed)
Date: 03/04/2016               Patient Name:  Thomas Adams MRN: [id number]  DOB: April 03, 1961 Age / Sex: 55 y.o., male   PCP: Estill Bamberg. Megan Salon, MD         Medical Service: Internal Medicine Teaching Service         Attending Physician: Dr. Aldine Contes, MD    First Contact: Beverely Pace, MS4 Pager: [phone]  Second Contact: Dr. 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 He reports that he completed his dialysis session today and felt like his heart was racing. Denies that he felt like his heart was skipping beats. Denies chest pain/pressure/tightness, dyspnea. He has never had this before. He notes that he did have emesis this morning when he took his blood pressure medications on an empty stomach. Denies nausea or diarrhea. Last BM yesterday. Denies myalgias,   He reports he has been losing weight but unable to quantify. No orthopnea. Denies recent changes in dialysis. No changes in diet recently - he reports he only eats 1-2 meals a day. Usually [place] or steak. Occasionally eats salads and fruit. His blood pressure medications were adjusted a month ago but he is unable to tell us which ones and in what manner.  No fevers/chills, vision changes, no throat pain, no cough, no abdominal pain, no hematochezia or melena, no dysuria, no hematuria, no bleeding/bruising, no edema, no arthralgias/myalgias, no rash, no weakness, no headache.  Lower back soreness for past couple of days that has been getting better. Feels its associated with position he slept in.  No over the counter meds, supplements.  Meds:  Hydralazine 100mg  TID Labetalol 200mg  BID Lisinopril 20mg  daily Cinacalcet 60mg  daily Sevelamer 800 mg TID with  meals   Allergies: Allergies as of 03/04/2016 - Review Complete 03/04/2016  Allergen Reaction Noted  . Penicillins Nausea Only, Rash, and Other (See Comments) 09/18/2013   Past Medical History:  Diagnosis Date  . CKD (chronic kidney disease), stage III   . Hypertension   . Renal insufficiency    dialysis 08/2013, tuesday/thursday/saturday    Family History: No family history of kidney disease. Paternal grandfather had HTN. No family history of cancer.  Social History: No tobacco, alcohol. Occasional marijuana.   Review of Systems: A complete ROS was negative except as per HPI.   Physical Exam: Blood pressure 125/81, pulse 101, temperature 98 F (36.7 C), temperature source Oral, resp. rate 20, height 5\' 9"  (1.753 m), weight 208 lb 1.8 oz (94.4 kg), SpO2 100 %. General Apperance: NAD Head: Normocephalic, atraumatic Eyes: PERRL, EOMI, anicteric sclera Ears: Normal external ear canal Nose: Nares normal, septum midline, mucosa normal Throat: Lips, mucosa and tongue normal  Neck: Supple, trachea midline Back: No tenderness or bony abnormality  Lungs: Clear to auscultation bilaterally. No wheezes, rhonchi or rales. Breathing comfortably Chest Wall: Nontender, no deformity Heart: Regular rate and rhythm, no murmur/rub/gallop Abdomen: Soft, nontender, nondistended, no rebound/guarding Extremities: Warm and well perfused, no edema, LUE fistula with palpable thrill Pulses: 2+ throughout Skin: No rashes or lesions Neurologic: Alert and oriented x 3. CNII-XII intact. Normal strength and sensation  EKG: Atrial fibrillation with RVR, T wave inversions in anterior leads.  No previous EKG for comparison.  CXR:  Monitoring leads overlie the patient. Stable enlarged cardiac and mediastinal contours. Elevation right hemidiaphragm. No consolidative pulmonary opacities. No pleural effusion or pneumothorax. Right shoulder joint degenerative changes. IMPRESSION: Cardiomegaly.  No acute  cardiopulmonary process.  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  Atrial fibrillation with RVR: HR up to 140 in the ED. BP stable between 125/81-164/87.  He was found to have potassium of 2, calcium of 5.4, magnesium of 1.2. Per his dialysis center, his potassium was 4.1 and calcium 9 on June 25. He was given 32meq of potassium and started on a diltizem gtt. He has converted to normal sinus rhythm on the telemetry by the time we evaluated him in the ED. The atrial fibrillation was probably provoked by the electrolyte abnormalities. He is afebrile with no leukocytosis making infection unlikely. Also no acute cardiopulmonary process on CXR. Uncertain why he has those electrolyte abnormalities. He has been on dialysis for 2 years. The dialysis center did not cite any particular reasons that could have led to this. He is not on any medications that could cause him to be hypokalemic and hypomagnesemic. Cinacalcet can cause hypocalcemia but would not cause the other electrolyte abnormalities. May possibly be related to his emesis (per HD center, this is chronic for him) +/- his diet. Discussed with nephrology who suggest that his labs may have been drawn too soon after his dialysis session and his body may have not equilibrated from the electrolyte shifts. -Admit to telemetry -Titrate off of diltiazem -Recheck CMP, Mg, Phos -Replete potassium, calcium, magnesium cautiously. -Check TSH -Check Troponin -Check UDS -Repeat EKG in the morning. -Given this is the first episode of atrial fibrillation, probably started today, now resolved, and reversible cause identified, would not start on anticoagulation. CHA2DS2-VASc 1 (hypertension).  HTN: -Continue home hydralazine 100mg  TID -Continue home labetalol 200mg  BID -Continue lisinopril 20mg  daily  ESRD: on HD MWF. Completed his session  today. He appears euvolemic. -Nephrology consulted -Continue home sevelamer. Hold cinacalcet for now  Chronic normocytic anemia: Hgb 7.8 on admission. He has anemia of chronic disease. -Recheck CBC in the morning.  FEN: Renal diet VTE ppx: Subq Hep CODE: Full  Dispo: Admit patient to Inpatient with expected length of stay greater than 2 midnights.  Signed: Milagros Loll, MD 03/04/2016, 2:40 PM  Pager: [phone]

## 2016-03-04 NOTE — ED Provider Notes (Addendum)
[place] DEPT Provider Note   CSN: [id number] Arrival date & time: 03/04/16  1134  First Provider Contact:  First MD Initiated Contact with Patient 03/04/16 1425        History   Chief Complaint Chief Complaint  Patient presents with  . Irregular Heart Beat    HPI Thomas Adams is a 55 y.o. male.  Patient is a 55 year old male with a history of hypertension, end-stage renal disease on dialysis presenting today by EMS for palpitations. Patient felt his normal self when he arrived at dialysis today but when they were finishing up dialysis and taking him off the machine he began to have palpitations. He denies any chest pain or shortness of breath but just feels slightly tired. Patient was found to be in a tachycardic rhythm when EMS arrived. In route they attempted to give adenosine without improvement in symptoms. Upon arrival here patient's only complaint is palpitations. Prior history of similar.   The history is provided by the patient and the EMS personnel.    Past Medical History:  Diagnosis Date  . CKD (chronic kidney disease), stage III   . Complication of anesthesia    " i HAD A HARD TIME WAKING WHEN THEY PUT MY FISTULA IN "  . Hypertension   . Renal insufficiency    dialysis 08/2013, tuesday/thursday/saturday    Patient Active Problem List   Diagnosis Date Noted  . Atrial fibrillation with RVR ([place]) 03/04/2016  . End stage renal disease ([place]) 11/04/2013  . CKD (chronic kidney disease) stage V requiring chronic dialysis ([place]) 09/23/2013  . Acute renal failure ([place]) 09/18/2013  . Malignant hypertension 09/18/2013  . Anemia 09/18/2013    Past Surgical History:  Procedure Laterality Date  . AV FISTULA PLACEMENT Left 09/22/2013   Procedure: ARTERIOVENOUS (AV) FISTULA CREATION RADIOCEPHALIC;  Surgeon: Elam Dutch, MD;  Location: [place];  Service: Vascular;  Laterality: Left;  . DIALYSIS FISTULA CREATION    . INSERTION OF DIALYSIS CATHETER Right 09/22/2013   Procedure: INSERTION OF DIALYSIS CATHETER; ULTRASOUND GUIDED;  Surgeon: Elam Dutch, MD;  Location: [facility] OR;  Service: Vascular;  Laterality: Right;  . REMOVAL OF A DIALYSIS CATHETER Right 09/22/2013   Procedure: REMOVAL OF A DIALYSIS CATHETER OF TEMPORARY RIGHT INTERNAL JUGULAR ;  Surgeon: Elam Dutch, MD;  Location: [place];  Service: Vascular;  Laterality: Right;       Home Medications    Prior to Admission medications   Medication Sig Start Date End Date Taking? Authorizing Provider  cinacalcet (SENSIPAR) 30 MG tablet Take 30 mg by mouth daily as needed.   Yes Historical Provider, MD  hydrALAZINE (APRESOLINE) 25 MG tablet Take 1 tablet (25 mg total) by mouth every 8 (eight) hours. 09/23/13  Yes Delfina Redwood, MD  labetalol (NORMODYNE) 200 MG tablet Take 1 tablet (200 mg total) by mouth 2 (two) times daily. 09/23/13  Yes Delfina Redwood, MD  lisinopril (PRINIVIL,ZESTRIL) 20 MG tablet Take 1 tablet by mouth daily. 12/18/15  Yes Historical Provider, MD  sevelamer carbonate (RENVELA) 800 MG tablet Take 2,400 mg by mouth 3 (three) times daily.   Yes Historical Provider, MD    Family History Family History  Problem Relation Age of Onset  . Hypertension Father     Social History Social History  Substance Use Topics  . Smoking status: Never Smoker  . Smokeless tobacco: Never Used  . Alcohol use No     Allergies   Penicillins   Review of  Systems Review of Systems  All other systems reviewed and are negative.    Physical Exam Updated Vital Signs BP (!) 160/90 (BP Location: Right Arm)   Pulse 73   Temp 98 F (36.7 C) (Oral)   Resp 16   Ht 5\' 9"  (1.753 m)   Wt 206 lb 6.4 oz (93.6 kg)   SpO2 100%   BMI 30.48 kg/m   Physical Exam  Constitutional: He is oriented to person, place, and time. He appears well-developed and well-nourished. No distress.  HENT:  Head: Normocephalic and atraumatic.  Mouth/Throat: Oropharynx is clear and moist.  Eyes: Conjunctivae  and EOM are normal. Pupils are equal, round, and reactive to light.  Neck: Normal range of motion. Neck supple.  Cardiovascular: Intact distal pulses.  An irregularly irregular rhythm present. Tachycardia present.   No murmur heard. Pulmonary/Chest: Effort normal and breath sounds normal. No respiratory distress. He has no wheezes. He has no rales.  Abdominal: Soft. He exhibits no distension. There is no tenderness. There is no rebound and no guarding.  Musculoskeletal: Normal range of motion. He exhibits no edema or tenderness.  Neurological: He is alert and oriented to person, place, and time.  Skin: Skin is warm and dry. No rash noted. No erythema.  Psychiatric: He has a normal mood and affect. His behavior is normal.  Nursing note and vitals reviewed.    ED Treatments / Results  Labs (all labs ordered are listed, but only abnormal results are displayed) Labs Reviewed  CBC WITH DIFFERENTIAL/PLATELET - Abnormal; Notable for the following:       Result Value   RBC 2.48 (*)    Hemoglobin 7.8 (*)    HCT 23.9 (*)    Platelets 124 (*)    All other components within normal limits  BASIC METABOLIC PANEL - Abnormal; Notable for the following:    Potassium 2.0 (*)    Chloride 115 (*)    Glucose, Bld 58 (*)    Creatinine, Ser 3.18 (*)    Calcium 5.4 (*)    GFR calc non Af Amer 21 (*)    GFR calc Af Amer 24 (*)    All other components within normal limits  MAGNESIUM - Abnormal; Notable for the following:    Magnesium 1.2 (*)    All other components within normal limits  I-STAT CHEM 8, ED - Abnormal; Notable for the following:    Sodium 148 (*)    Potassium 2.1 (*)    Creatinine, Ser 3.30 (*)    Glucose, Bld 55 (*)    Calcium, Ion 0.75 (*)    Hemoglobin 7.8 (*)    HCT 23.0 (*)    All other components within normal limits  TSH  TROPONIN I  URINE RAPID DRUG SCREEN, HOSP PERFORMED  COMPREHENSIVE METABOLIC PANEL  MAGNESIUM  PHOSPHORUS  CBC  RENAL FUNCTION PANEL  MAGNESIUM     EKG  EKG Interpretation  Date/Time:  Monday March 04 2016 11:43:08 EDT Ventricular Rate:  137 PR Interval:    QRS Duration: 90 QT Interval:  305 QTC Calculation: 461 R Axis:   39 Text Interpretation:  Atrial fibrillation LVH with secondary repolarization abnormality No previous tracing Confirmed by Maryan Rued  MD, Loree Fee ([id number]) on 03/04/2016 12:06:09 PM Also confirmed by Maryan Rued  MD, Loree Fee ([id number]), editor [place], Joelene Millin [phone])  on 03/04/2016 12:25:27 PM       Radiology Dg Chest Port 1 View  Result Date: 03/04/2016 CLINICAL DATA:  Patient with palpitations drain dialysis.  EXAM: PORTABLE CHEST 1 VIEW COMPARISON:  Chest CT 12/07/2015; chest radiograph 09/22/2013. FINDINGS: Monitoring leads overlie the patient. Stable enlarged cardiac and mediastinal contours. Elevation right hemidiaphragm. No consolidative pulmonary opacities. No pleural effusion or pneumothorax. Right shoulder joint degenerative changes. IMPRESSION: Cardiomegaly.  No acute cardiopulmonary process. Electronically Signed   By: Lovey Newcomer M.D.   On: 03/04/2016 12:11   Procedures Procedures (including critical care time)  Medications Ordered in ED Medications  diltiazem (CARDIZEM) 1 mg/mL load via infusion 20 mg (20 mg Intravenous Bolus from Bag 03/04/16 1231)    And  diltiazem (CARDIZEM) 100 mg in dextrose 5 % 100 mL (1 mg/mL) infusion (0 mg/hr Intravenous Stopped 03/04/16 1554)  hydrALAZINE (APRESOLINE) tablet 100 mg (not administered)  labetalol (NORMODYNE) tablet 200 mg (not administered)  heparin injection 5,000 Units (not administered)  sodium chloride flush (NS) 0.9 % injection 3 mL (not administered)  acetaminophen (TYLENOL) tablet 650 mg (not administered)    Or  acetaminophen (TYLENOL) suppository 650 mg (not administered)  senna-docusate (Senokot-S) tablet 1 tablet (not administered)  lisinopril (PRINIVIL,ZESTRIL) tablet 20 mg (not administered)  sevelamer carbonate (RENVELA) tablet 800 mg (not  administered)  potassium chloride 10 mEq in 100 mL IVPB (10 mEq Intravenous New Bag/Given 03/04/16 1619)     Initial Impression / Assessment and Plan / ED Course  I have reviewed the triage vital signs and the nursing notes.  Pertinent labs & imaging results that were available during my care of the patient were reviewed by me and considered in my medical decision making (see chart for details).  Clinical Course   Patient is a 55 year old male presenting today with palpitations from dialysis. Patient found to be in A. fib RVR which is new. He denies any prior history of this. Patient was given adenosine by EMS without improvement in symptoms. Patient currently has no other complaints such as chest pain or shortness of breath. Blood pressure is stable. Patient was given an IV bolus of Cardizem with improvement in heart rate but A. fib remained. Patient was found to be hypokalemic today which may be the result of his dysrhythmia. Potassium of 2.0. This was confirmed by a second test and patient started on IV potassium. Patient also found to have low magnesium. He remained hemodynamically stable but will admit for further care and treatment. This patients CHA2DS2-VASc Score and unadjusted Ischemic Stroke Rate (% per year) is equal to 0.6 % stroke rate/year from a score of 1  Above score calculated as 1 point each if present [CHF, HTN, DM, Vascular=MI/PAD/Aortic Plaque, Age if 65-74, or Male] Above score calculated as 2 points each if present [Age > 75, or Stroke/TIA/TE]   CRITICAL CARE Performed by: Blanchie Dessert Total critical care time: 30 minutes Critical care time was exclusive of separately billable procedures and treating other patients. Critical care was necessary to treat or prevent imminent or life-threatening deterioration. Critical care was time spent personally by me on the following activities: development of treatment plan with patient and/or surrogate as well as nursing,  discussions with consultants, evaluation of patient's response to treatment, examination of patient, obtaining history from patient or surrogate, ordering and performing treatments and interventions, ordering and review of laboratory studies, ordering and review of radiographic studies, pulse oximetry and re-evaluation of patient's condition.   Final Clinical Impressions(s) / ED Diagnoses   Final diagnoses:  Hypokalemia  Hypomagnesemia  Atrial fibrillation with rapid ventricular response [facility])    New Prescriptions Current Discharge Medication List  Blanchie Dessert, MD 03/04/16 1740    Blanchie Dessert, MD 03/07/16 [phone]

## 2016-03-04 NOTE — ED Notes (Signed)
Attempted Report x1.   

## 2016-03-04 NOTE — ED Triage Notes (Signed)
Per [place], Pt was at Dialysis clinic when he started to have "palpitations". Pt denied dizziness, lightheadedness, nausea, vomiting, diarrhea, or SOB. Pt has HX fo Renal Failure and HTN. Vitals per EMS: 100% on RA, 152/78, 140 Irregular. Pt found to be in atrial fib with no Hx of the same.

## 2016-03-04 NOTE — ED Notes (Signed)
Pt being taken upstairs to [address] at this time

## 2016-03-04 NOTE — H&P (Signed)
Date: 03/04/2016               Patient Name:  Thomas Adams MRN: [id number]  DOB: January 24, 1961 Age / Sex: 55 y.o., male   PCP: Estill Bamberg. Megan Salon, MD              Medical Service: Internal Medicine Teaching Service              Attending Physician: Dr. Aldine Contes, MD    First Contact: Beverely Pace, MS 4 Pager: [phone]  Second Contact: Dr. Jacques Earthly  Pager: [phone]            After Hours (After 5p/  First Contact Pager: [phone]  weekends / holidays): Second Contact Pager: [phone]   Chief Complaint: palpitations   History of Present Illness: Thomas Adams is a 55 yo gentleman with ESRD on MWF dialysis and HTN who presents with palpitations, found in AFib with RVR with electrolyte abnormalities.   Patient was in his baseline state of health until this morning after dialysis [facility], Dr. Pamala Duffel Kidney) when he went to stand up afterwards and get weighed and felt sudden onset "heart racing". Patient notes it happened right after standing and felt his heart was beating as if he was just running, didn't feel like irregular beating. He denies chest pain, dyspnea, orthopnea, PND, past palpitations or arrhythmia.   He notes he vomited after taking his antihypertensives this AM (labetalol and hydralazine), which he notes happens when he takes them on an empty stomach. He has been well recently with no recent nausea, vomiting or diuretic use. He has had no recent changes to his dialysate, notes increase dose in antihypertensives 1 month ago but no other medication changes with no recent PPI, antimicrobial, OTC herbs or supplement intake. He denies weakness and myalgia. He endorses L finger anesthesia but denies paresthesia. He does note he thinks he has lost weight as he bought clothes about a year ago that fit him more loosely now. He notes "I don't eat much" at baseline, eating 1-2 meals a day, mostly a mix of [place] and beef for protein with salads and fruit, but some  days will just be fruit if he doesn't have an appetite. He cites this is due to appetite and not nausea and has been the case for a long time.   He denies, HA, vision change, dysphagia, cough, abdominal pain, melena, hematochezia, diarrhea, constipation, dysuria, hematuria, arthralgias, myalgia, rashes. He does note lower back soreness that started a few days ago, was 7/10 but now 3/10, intermittent and improving.   Per his dialysis center, [facility] in [place], patient's story was confirmed with irregular HR in 140s noted before EMS took him to ED. They have not noted any issues with dialysate. They note he has vomited before dialysis in the past and state "that's his baseline". They note last labs 6/28 K 4.1, TCalcium 9, 4/36 Mg1.9.   In the ED, labs notable for K 2, SCr 3.18, Ionized Ca 0.75, Gluc 58, Hgb 7.8, PLTs 124, telemetry showing AFib c RVR to 140, resolved with diltiazem infusion and drip, EKG with ?lateral lead U waves and STD, CXR with mild inc cardiac silhouette, no acute CP disease.    Meds: Current Facility-Administered Medications  Medication Dose Route Frequency Provider Last Rate Last Dose  . calcium gluconate 1 g in sodium chloride 0.9 % 100 mL IVPB  1 g Intravenous Once Milagros Loll, MD      . diltiazem [place])  100 mg in dextrose 5 % 100 mL (1 mg/mL) infusion  5-15 mg/hr Intravenous Continuous Blanchie Dessert, MD 5 mL/hr at 03/04/16 1230 5 mg/hr at 03/04/16 1230  . magnesium sulfate IVPB 2 g 50 mL  2 g Intravenous Once Milagros Loll, MD      . potassium chloride SA (K-DUR,KLOR-CON) CR tablet 40 mEq  40 mEq Oral Once Milagros Loll, MD       Current Outpatient Prescriptions  Medication Sig Dispense Refill  . acetaminophen (TYLENOL) 325 MG tablet Take 2 tablets (650 mg total) by mouth every 6 (six) hours as needed for mild pain (or Fever >/= 101). (Patient not taking: Reported on 12/14/2015)    . amLODipine (NORVASC) 10 MG tablet Take 1 tablet (10 mg total) by  mouth daily. 30 tablet 0  . cetirizine (ZYRTEC) 10 MG tablet Take 10 mg by mouth daily as needed for allergies.    . cinacalcet (SENSIPAR) 30 MG tablet Take 30 mg by mouth daily as needed.    . hydrALAZINE (APRESOLINE) 25 MG tablet Take 1 tablet (25 mg total) by mouth every 8 (eight) hours. 90 tablet 0  . labetalol (NORMODYNE) 200 MG tablet Take 1 tablet (200 mg total) by mouth 2 (two) times daily. 60 tablet 0  . omeprazole (PRILOSEC) 20 MG capsule Take 20 mg by mouth 2 (two) times daily as needed (for indigestion). Reported on 12/14/2015    . oxyCODONE-acetaminophen (PERCOCET/ROXICET) 5-325 MG per tablet Take 1 tablet by mouth every 8 (eight) hours as needed for severe pain. (Patient not taking: Reported on 12/14/2015) 20 tablet 0    Allergies: Allergies as of 03/04/2016 - Review Complete 03/04/2016  Allergen Reaction Noted  . Penicillins Nausea Only, Rash, and Other (See Comments) 09/18/2013   Past Medical History:  Diagnosis Date  . CKD (chronic kidney disease), stage III   . Hypertension   . Renal insufficiency    dialysis 08/2013, tuesday/thursday/saturday   Past Surgical History:  Procedure Laterality Date  . AV FISTULA PLACEMENT Left 09/22/2013   Procedure: ARTERIOVENOUS (AV) FISTULA CREATION RADIOCEPHALIC;  Surgeon: Elam Dutch, MD;  Location: [place];  Service: Vascular;  Laterality: Left;  . INSERTION OF DIALYSIS CATHETER Right 09/22/2013   Procedure: INSERTION OF DIALYSIS CATHETER; ULTRASOUND GUIDED;  Surgeon: Elam Dutch, MD;  Location[facility];  Service: Vascular;  Laterality: Right;  . REMOVAL OF A DIALYSIS CATHETER Right 09/22/2013   Procedure: REMOVAL OF A DIALYSIS CATHETER OF TEMPORARY RIGHT INTERNAL JUGULAR ;  Surgeon: Elam Dutch, MD;  Location: [place];  Service: Vascular;  Laterality: Right;   Family History  Problem Relation Age of Onset  . Hypertension Father    Social History   Social History  . Marital status: Divorced    Spouse name: N/A  . Number of  children: N/A  . Years of education: N/A   Occupational History  . Not on file.   Social History Main Topics  . Smoking status: Never Smoker  . Smokeless tobacco: Never Used  . Alcohol use No  . Drug use: No  . Sexual activity: Not on file   Other Topics Concern  . Not on file   Social History Narrative  . No narrative on file   Family: no kidney disease, paternal grandfather with HTN  Social: Does not some cigarettes, does not drink, smokes medical marijuana from a friend   Review of Systems: Pertinent items are noted in HPI.  Physical Exam: Blood pressure 141/64,  pulse 88, temperature 98 F (36.7 C), temperature source Oral, resp. rate 25, height 5\' 9"  (1.753 m), weight 94.4 kg (208 lb 1.8 oz), SpO2 100 %. BP 141/64   Pulse 88   Temp 98 F (36.7 C) (Oral)   Resp 25   Ht 5\' 9"  (1.753 m)   Wt 94.4 kg (208 lb 1.8 oz)   SpO2 100%   BMI 30.73 kg/m   General Appearance:    Alert, cooperative, no distress, appears stated age  Head:    Normocephalic, without obvious abnormality, atraumatic  Eyes:    PERRL, conjunctiva/corneas clear, EOM's intact  Nose:   Nares normal, septum midline, mucosa normal, no drainage    or sinus tenderness  Throat:   Lips, mucosa, and tongue normal; teeth and gums normal, poor dentition with multiple cavities   Neck:   Supple, symmetrical, trachea midline, no adenopathy;       thyroid:  No enlargement/tenderness/nodules; no JVP appreciated   Back:     Symmetric, no curvature, ROM normal, no CVA tenderness  Lungs:     Clear to auscultation bilaterally, respirations unlabored  Chest wall:    No tenderness or deformity  Heart:    Regular rate and rhythm, S1 and S2 normal, Grade 2/6 crescendo decrescendo, low frequency murmur best heard RUSB, non-radiating   Abdomen:     Soft, non-tender, bowel sounds active all four quadrants,    no masses, no organomegaly  Extremities:   Extremities normal, atraumatic, no cyanosis or edema  Pulses:   2+ and  symmetric all extremities  Skin:   Skin color, texture, turgor normal, no rashes or lesions  Lymph nodes:   Cervical, supraclavicular, and axillary nodes normal  Neurologic:   A&Ox3, CN II-XII intact to detailed exam, strength 5/5 extension flexion bilateral upper and lower extremities a hip and elbow, light touch sensation intact and equal bilaterally   Lab results: @LABTEST @  Imaging results: Dg Chest Port 1 View  Result Date: 03/04/2016 CLINICAL DATA:  Patient with palpitations drain dialysis. EXAM: PORTABLE CHEST 1 VIEW COMPARISON:  Chest CT 12/07/2015; chest radiograph 09/22/2013. FINDINGS: Monitoring leads overlie the patient. Stable enlarged cardiac and mediastinal contours. Elevation right hemidiaphragm. No consolidative pulmonary opacities. No pleural effusion or pneumothorax. Right shoulder joint degenerative changes. IMPRESSION: Cardiomegaly.  No acute cardiopulmonary process. Electronically Signed   By: Lovey Newcomer M.D.   On: 03/04/2016 12:11   Other results: EKG: Atrial fibrillation, ?lateral U waves and STD in V5-6.  Assessment & Plan by Problem: Active Problems:   Atrial fibrillation with RVR [facility])  Mr. Gwathney is a 55 yo gentleman with ESRD on MWF dialysis and HTN who presents with palpitations, found in AFib with RVR with electrolyte abnormalities.   ESRD on MWF dialysis with multiple electrolyte abnormalities: Per nephrology, the hypokalemia, hypocalcemia and hypernatremia are possible in the acute setting following dialysis, though the hypomagnesemia is less likely and changes should be mild. Patient does endorse vomiting this AM which likely contributed, though does not appear significant enough to represent sole etiology of hypokalemia and hypomagnesemia. No signs c/w transcellular K shift. Patient denies diarrhea, EtOh intake, abdominal pain (pacreatitis), has not been taking home PPI, no recent nephrotoxic medication or diuretic use. Hypercalcemia can cause hypoMg, but Ca  is also low. No history of transplant or DM. An uncommon cause of hypokalemia is low calorie intake <800/day, though often occurs after initiation of such a diet. For patients on dialysis with underlying potassium depletion, the metabolic acidosis  induced by renal failure can result in a relatively normal predialysis serum potassium concentration due to potassium movement out of the cells. However, the high flows attained during hemodialysis can rapidly correct the acidemia, leading to potassium entry into cells and a potentially large reduction in the serum potassium concentration (Wiegand et al. Luisa Dago Int Med [phone]). Patient does have known adrenal adenoma and renovascular disease, though no diuretic use and K one month ago was WNL.  -- consult to inpatient nephrology, appreciate recommendations -- continue home sevalamer 800mg  tid  -- hold home cincacalcet  -- f/u PM BMP, Mg, Phos replete lytes -- f/u AM BMP   Paroxsymal AFib with RVR: CHADS-VASc 1 HAS-BLED 2.  Currently in NSR, rate controlled with diltiazem, asymptomatic. No known history of AFib. This was likely precipitated by electrolyte abnormality as above. Patient could have structural disease given HTN with end organ damage. No other clear precipitant of AFib including EtOH/drug intake and recent illness, new/worsening CP disease.  -- continue on telemetry  -- continue diltiazem gtt -- AM EKG  -- f/u TSH -- f/u troponin I -- consider echo   HTN: Mildly hypertensive on admission 164/87.  -- continue home hydralazine 100mg  tid  -- continue home labetalol 200mg  BID -- continue home lisinopril 20mg  daily   Normocytic anemia: Chronicity unknown. 7.8 on admission. Hgb 6.7-7.7 2 years ago. Currently asymptomatic. Likely due to AOCI and ESRD.  -- f/u AM CBC  FEN/GI/ppx Diet: Renal DVT: SQ Heparin  IVF: none indicated   This is a Careers information officer Note.  The care of the patient was discussed with Dr. Jacques Earthly and the assessment and  plan was formulated with their assistance.  Please see their note for official documentation of the patient encounter.   Signed: Evert Kohl, Medical Student 03/04/2016, 3:21 PM

## 2016-03-05 ENCOUNTER — Inpatient Hospital Stay (HOSPITAL_COMMUNITY): Payer: Medicare Other

## 2016-03-05 DIAGNOSIS — I4891 Unspecified atrial fibrillation: Secondary | ICD-10-CM

## 2016-03-05 LAB — RENAL FUNCTION PANEL
ALBUMIN: 3.5 g/dL (ref 3.5–5.0)
Anion gap: 8 (ref 5–15)
BUN: 18 mg/dL (ref 6–20)
CALCIUM: 8.9 mg/dL (ref 8.9–10.3)
CHLORIDE: 99 mmol/L — AB (ref 101–111)
CO2: 30 mmol/L (ref 22–32)
CREATININE: 6.73 mg/dL — AB (ref 0.61–1.24)
GFR calc Af Amer: 10 mL/min — ABNORMAL LOW (ref >60)
GFR, EST NON AFRICAN AMERICAN: 8 mL/min — AB (ref >60)
Glucose, Bld: 83 mg/dL (ref 65–99)
Phosphorus: 4.3 mg/dL (ref 2.5–4.6)
Potassium: 4 mmol/L (ref 3.5–5.1)
SODIUM: 137 mmol/L (ref 135–145)

## 2016-03-05 LAB — ECHOCARDIOGRAM COMPLETE
AO mean calculated velocity dopler: 156 cm/s
AOVTI: 36.6 cm
AV Area VTI: 2.08 cm2
AV Peak grad: 27 mmHg
AV VEL mean LVOT/AV: 0.72
AV area mean vel ind: 1.08 cm2/m2
AV vel: 2.63
AVAREAMEANV: 2.25 cm2
AVAREAVTIIND: 1.26 cm2/m2
AVG: 12 mmHg
AVLVOTPG: 12 mmHg
AVPKVEL: 258 cm/s
Ao pk vel: 0.66 m/s
CHL CUP AV PEAK INDEX: 1
CHL CUP MV DEC (S): 201
EERAT: 18.25
EWDT: 201 ms
FS: 36 % (ref 28–44)
Height: 69 in
IV/PV OW: 0.93
LA ID, A-P, ES: 52 mm
LA diam end sys: 52 mm
LA diam index: 2.49 cm/m2
LA vol index: 39.2 mL/m2
LA vol: 81.9 mL
LAVOLA4C: 90.3 mL
LV E/e' medial: 18.25
LV E/e'average: 18.25
LV PW d: 14.4 mm — AB (ref 0.6–1.1)
LV TDI E'LATERAL: 6.96
LV TDI E'MEDIAL: 6.09
LVELAT: 6.96 cm/s
LVOT VTI: 30.7 cm
LVOT area: 3.14 cm2
LVOT diameter: 20 mm
LVOT peak VTI: 0.84 cm
LVOTPV: 171 cm/s
LVOTSV: 96 mL
Lateral S' vel: 17 cm/s
MV pk A vel: 94.3 m/s
MV pk E vel: 127 m/s
MVPG: 6 mmHg
TAPSE: 25.4 mm
Valve area index: 1.26
Valve area: 2.63 cm2
Weight: 3292.8 oz

## 2016-03-05 LAB — CBC
HCT: 28.1 % — ABNORMAL LOW (ref 39.0–52.0)
Hemoglobin: 8.8 g/dL — ABNORMAL LOW (ref 13.0–17.0)
MCH: 30.6 pg (ref 26.0–34.0)
MCHC: 31.3 g/dL (ref 30.0–36.0)
MCV: 97.6 fL (ref 78.0–100.0)
PLATELETS: 144 10*3/uL — AB (ref 150–400)
RBC: 2.88 MIL/uL — ABNORMAL LOW (ref 4.22–5.81)
RDW: 15.1 % (ref 11.5–15.5)
WBC: 6 10*3/uL (ref 4.0–10.5)

## 2016-03-05 LAB — MAGNESIUM: MAGNESIUM: 1.9 mg/dL (ref 1.7–2.4)

## 2016-03-05 LAB — TROPONIN I: Troponin I: 0.12 ng/mL (ref <0.03)

## 2016-03-05 MED ORDER — ASPIRIN 81 MG PO CHEW
81.0000 mg | CHEWABLE_TABLET | Freq: Every day | ORAL | Status: DC
  Administered 2016-03-05: 81 mg via ORAL
  Filled 2016-03-05: qty 1

## 2016-03-05 MED ORDER — AMLODIPINE BESYLATE 10 MG PO TABS
10.0000 mg | ORAL_TABLET | Freq: Every day | ORAL | Status: DC
  Administered 2016-03-05: 10 mg via ORAL
  Filled 2016-03-05: qty 1

## 2016-03-05 MED ORDER — AMLODIPINE BESYLATE 10 MG PO TABS: 10.0000 mg | ORAL_TABLET | Freq: Every day | ORAL | 1 refills | Status: AC

## 2016-03-05 NOTE — Discharge Instructions (Addendum)
TAKE ALL OF YOUR MEDICATIONS AS PRESCRIBED. NEW MEDICATIONS INCLUDE ASPIRIN 81 MG DAILY AND AMLODIPINE 10 MG DAILY IN ADDITION TO YOUR PREVIOUS MEDICATIONS.   Atrial Fibrillation Atrial fibrillation is a type of irregular or rapid heartbeat (arrhythmia). In atrial fibrillation, the heart quivers continuously in a chaotic pattern. This occurs when parts of the heart receive disorganized signals that make the heart unable to pump blood normally. This can increase the risk for stroke, heart failure, and other heart-related conditions. There are different types of atrial fibrillation, including:  Paroxysmal atrial fibrillation. This type starts suddenly, and it usually stops on its own shortly after it starts.  Persistent atrial fibrillation. This type often lasts longer than a week. It may stop on its own or with treatment.  Long-lasting persistent atrial fibrillation. This type lasts longer than 12 months.  Permanent atrial fibrillation. This type does not go away. Talk with your health care provider to learn about the type of atrial fibrillation that you have. CAUSES This condition is caused by some heart-related conditions or procedures, including:  A heart attack.  Coronary artery disease.  Heart failure.  Heart valve conditions.  High blood pressure.  Inflammation of the sac that surrounds the heart (pericarditis).  Heart surgery.  Certain heart rhythm disorders, such as Wolf-Parkinson-White syndrome. Other causes include:  Pneumonia.  Obstructive sleep apnea.  Blockage of an artery in the lungs (pulmonary embolism, or PE).  Lung cancer.  Chronic lung disease.  Thyroid problems, especially if the thyroid is overactive (hyperthyroidism).  Caffeine.  Excessive alcohol use or illegal drug use.  Use of some medicines, including certain decongestants and diet pills. Sometimes, the cause cannot be found. RISK FACTORS This condition is more likely to develop  in:  People who are older in age.  People who smoke.  People who have diabetes mellitus.  People who are overweight (obese).  Athletes who exercise vigorously. SYMPTOMS Symptoms of this condition include:  A feeling that your heart is beating rapidly or irregularly.  A feeling of discomfort or pain in your chest.  Shortness of breath.  Sudden light-headedness or weakness.  Getting tired easily during exercise. In some cases, there are no symptoms. DIAGNOSIS Your health care provider may be able to detect atrial fibrillation when taking your pulse. If detected, this condition may be diagnosed with:  An electrocardiogram (ECG).  A Holter monitor test that records your heartbeat patterns over a 24-hour period.  Transthoracic echocardiogram (TTE) to evaluate how blood flows through your heart.  Transesophageal echocardiogram (TEE) to view more detailed images of your heart.  A stress test.  Imaging tests, such as a CT scan or chest X-ray.  Blood tests. TREATMENT The main goals of treatment are to prevent blood clots from forming and to keep your heart beating at a normal rate and rhythm. The type of treatment that you receive depends on many factors, such as your underlying medical conditions and how you feel when you are experiencing atrial fibrillation. This condition may be treated with:  Medicine to slow down the heart rate, bring the heart's rhythm back to normal, or prevent clots from forming.  Electrical cardioversion. This is a procedure that resets your heart's rhythm by delivering a controlled, low-energy shock to the heart through your skin.  Different types of ablation, such as catheter ablation, catheter ablation with pacemaker, or surgical ablation. These procedures destroy the heart tissues that send abnormal signals. When the pacemaker is used, it is placed under your skin to  help your heart beat in a regular rhythm. HOME CARE INSTRUCTIONS  Take over-the  counter and prescription medicines only as told by your health care provider.  If your health care provider prescribed a blood-thinning medicine (anticoagulant), take it exactly as told. Taking too much blood-thinning medicine can cause bleeding. If you do not take enough blood-thinning medicine, you will not have the protection that you need against stroke and other problems.  Do not use tobacco products, including cigarettes, chewing tobacco, and e-cigarettes. If you need help quitting, ask your health care provider.  If you have obstructive sleep apnea, manage your condition as told by your health care provider.  Do not drink alcohol.  Do not drink beverages that contain caffeine, such as coffee, soda, and tea.  Maintain a healthy weight. Do not use diet pills unless your health care provider approves. Diet pills may make heart problems worse.  Follow diet instructions as told by your health care provider.  Exercise regularly as told by your health care provider.  Keep all follow-up visits as told by your health care provider. This is important. PREVENTION  Avoid drinking beverages that contain caffeine or alcohol.  Avoid certain medicines, especially medicines that are used for breathing problems.  Avoid certain herbs and herbal medicines, such as those that contain ephedra or ginseng.  Do not use illegal drugs, such as cocaine and amphetamines.  Do not smoke.  Manage your high blood pressure. SEEK MEDICAL CARE IF:  You notice a change in the rate, rhythm, or strength of your heartbeat.  You are taking an anticoagulant and you notice increased bruising.  You tire more easily when you exercise or exert yourself. SEEK IMMEDIATE MEDICAL CARE IF:  You have chest pain, abdominal pain, sweating, or weakness.  You feel nauseous.  You notice blood in your vomit, bowel movement, or urine.  You have shortness of breath.  You suddenly have swollen feet and ankles.  You  feel dizzy.  You have sudden weakness or numbness of the face, arm, or leg, especially on one side of the body.  You have trouble speaking, trouble understanding, or both (aphasia).  Your face or your eyelid droops on one side. These symptoms may represent a serious problem that is an emergency. Do not wait to see if the symptoms will go away. Get medical help right away. Call your local emergency services (911 in the [place] Do not drive yourself to the hospital.   This information is not intended to replace advice given to you by your health care provider. Make sure you discuss any questions you have with your health care provider.   Document Released: 07/29/2005 Document Revised: 04/19/2015 Document Reviewed: 11/23/2014 Elsevier Interactive Patient Education [employer].

## 2016-03-05 NOTE — Progress Notes (Signed)
  Date: 03/05/2016  Patient name: Thomas Adams  Medical record number: [id number]  Date of birth: July 07, 1961   I have seen and evaluated Thomas Adams and discussed their care with the Residency Team. In brief, patient is a 55 y/o male with PMH of ESRD on HD, HTN who p/w palpitations * 1 day. Patient was at HD yesterday and after completing his session he noted that his heart was racing. No CP, no SOB, no diaphoresis, no abd pain, no n/v, no diarrhea, no focal weakness, no syncope. He was brought to the ED for work up. He feels well currently with no new complaints.   PMHx, Fam Hx, and/or Soc Hx : as per resident admit note  Vitals:   03/04/16 2055 03/05/16 0500  BP: (!) 174/72 (!) 168/88  Pulse: 78 79  Resp: 18 18  Temp: 99.2 F (37.3 C) 98.3 F (36.8 C)   Gen: AAO*3, NAD CVS: RRR, 2/6 systolic murmur + Lungs: CTA b/l Abd: soft, non tender, BS + Ext: no edema  Assessment and Plan: I have seen and evaluated the patient as outlined above. I agree with the formulated Assessment and Plan as detailed in the residents' note, with the following changes:   1. Afib with RVR: -Patient presented with palpitations and was noted to be in afib with rvr which converted to NSR with diltiazem gtt. It is possible that this was secondary to electrolyte abnormalities as he was hypokalemic and hyopcalcemic as well as hypomagnesemic on admission.  - Patient now in NSR with low CHADS2vasc score (1). Will start asa 81 mg daily - f/u 2 D ECHO - c/w labetolol PO - Troponins mildly elevated likely demand ischemia secondary to afib with rvr - Patient stable for d/c home today  Thomas Contes, MD 7/25/201712:34 PM

## 2016-03-05 NOTE — Progress Notes (Signed)
Subjective: Patient continues to feel well. He denies CP, dyspnea, palpitations, weakness. He does not want to continue on renal diet. He is in agreement with the plan and has no other questions or concerns other than having his diet changed.   Objective: Vital signs in last 24 hours: Vitals:   03/04/16 1619 03/04/16 1639 03/04/16 2055 03/05/16 0500  BP: 141/70 (!) 160/90 (!) 174/72 (!) 168/88  Pulse: 75 73 78 79  Resp: 22 16 18 18   Temp:  98 F (36.7 C) 99.2 F (37.3 C) 98.3 F (36.8 C)  TempSrc:  Oral Oral Oral  SpO2: 99% 100% 100% 100%  Weight:  93.6 kg (206 lb 6.4 oz)  93.4 kg (205 lb 12.8 oz)  Height:       Weight change:  No intake or output data in the 24 hours ending 03/05/16 1059  General Appearance:    Alert, cooperative, no distress, appears stated age  Head:    Normocephalic, without obvious abnormality, atraumatic  Eyes:    PERRL, conjunctiva/corneas clear, EOM's intact  Neck:   Supple, symmetrical, trachea midline, no adenopathy;       thyroid:  No enlargement/tenderness/nodules; no JVP appreciated   Lungs:     Clear to auscultation bilaterally, respirations unlabored  Chest wall:    No tenderness or deformity  Heart:    Regular rate and rhythm, S1 and S2 normal, Grade 2/6 crescendo decrescendo, low frequency systolic murmur best heard RUSB, non-radiating   Abdomen:     Soft, non-tender, bowel sounds active all four quadrants,    no masses, no organomegaly  Extremities:   Extremities normal, atraumatic, no cyanosis or edema  Pulses:   2+ and symmetric all extremities  Skin:   Skin color, texture, turgor normal, no rashes or lesions  Lymph nodes:   Cervical, supraclavicular, and axillary nodes normal  Neurologic:   A&Ox3  Lab Results: @LABTEST2 @ Micro Results: No results found for this or any previous visit (from the past 240 hour(s)). Studies/Results: Dg Chest Port 1 View  Result Date: 03/04/2016 CLINICAL DATA:  Patient with palpitations drain dialysis.  EXAM: PORTABLE CHEST 1 VIEW COMPARISON:  Chest CT 12/07/2015; chest radiograph 09/22/2013. FINDINGS: Monitoring leads overlie the patient. Stable enlarged cardiac and mediastinal contours. Elevation right hemidiaphragm. No consolidative pulmonary opacities. No pleural effusion or pneumothorax. Right shoulder joint degenerative changes. IMPRESSION: Cardiomegaly.  No acute cardiopulmonary process. Electronically Signed   By: Lovey Newcomer M.D.   On: 03/04/2016 12:11  Medications: I have reviewed the patient's current medications. Scheduled Meds: . aspirin  81 mg Oral Daily  . heparin  5,000 Units Subcutaneous Q8H  . hydrALAZINE  100 mg Oral Q8H  . labetalol  200 mg Oral BID  . lisinopril  20 mg Oral Daily  . sevelamer carbonate  800 mg Oral TID WC  . sodium chloride flush  3 mL Intravenous Q12H   Continuous Infusions:  PRN Meds:.acetaminophen **OR** acetaminophen, senna-docusate   Assessment/Plan: Active Problems:   End stage renal disease (HCC)   Atrial fibrillation with RVR [facility])  Mr. Gronlund is a 55 yo gentleman with ESRD on MWF dialysis and HTN who presents with palpitations, found in AFib with RVR with electrolyte abnormalities.   Paroxsymal AFib with RVR: CHADS-VASc 1 HAS-BLED 2.  Currently in NSR, off diltiazem, asymptomatic. This was his first known symptomatic episode of AFib with RVR that lasted ~2-3 hours, converted to NSR after rate control, likely from a reversible cause (hypokalemia, hypomagnesemia) but structural etiology also  possible given HTN with ESRD, though less likely given echo in 2016 showing mild LA dilation with EF 60-65% with no wall motion abnormalities. Evidence for anticoagulation is mixed for CHADS-VASC 1 given low enrollment and inequal attributable risk ratios. We will start aspirin and follow-up echo to consider anticoagulation if worsening function or structural disease. Patient will remain on labetolol which should assist with rate control and defer additional  medication to OP setting. TSH WNL and no other clear precipitants. AM EKG showing lateral TWI. Upon review of stress testing, echo and EKG at rest and exercise in 2016, these are old findings. His troponin elevation is likely due to demand ischemia and decreased clearance, trending down since admission.  -- continue on telemetry  -- f/u echo -- start ASA daily -- continue labetalol 200mg  BID  ESRD on MWF dialysis with multiple electrolyte abnormalities: Most likely due to post-dialysis, pre-redistribution of electrolytes to the intravascular space complicated by poor dietary intake and vomiting. Labs now WNL.  -- continue home sevalamer 800mg  tid  -- hold home cincacalcet, restart at discharge   HTN: Mildly hypertensive on admission 164/87.  -- continue home hydralazine 100mg  tid  -- continue home labetalol as above  -- continue home lisinopril 20mg  daily   Normocytic anemia: Chronicity unknown. 7.8 on admission. Hgb 6.7-7.7 2 years ago. 8.8 today. Currently asymptomatic. Likely due to AOCI and ESRD.   FEN/GI/ppx Diet: Change to heart healthy  DVT: SQ Heparin  IVF: none indicated   This is a Careers information officer Note.  The care of the patient was discussed with Dr. Martyn Malay and the assessment and plan formulated with their assistance.  Please see their attached note for official documentation of the daily encounter.   LOS: 1 day   Evert Kohl, Medical Student 03/05/2016, 10:59 AM

## 2016-03-05 NOTE — Progress Notes (Signed)
  Echocardiogram 2D Echocardiogram has been performed.  Thomas Adams 03/05/2016, 11:49 AM

## 2016-03-05 NOTE — Progress Notes (Signed)
   Subjective: Patient was seen and examined this morning. Patient states he feels well and denies any chest pain, palpitations or shortness of breath.   Objective: Vital signs in last 24 hours: Vitals:   03/04/16 1619 03/04/16 1639 03/04/16 2055 03/05/16 0500  BP: 141/70 (!) 160/90 (!) 174/72 (!) 168/88  Pulse: 75 73 78 79  Resp: 22 16 18 18   Temp:  98 F (36.7 C) 99.2 F (37.3 C) 98.3 F (36.8 C)  TempSrc:  Oral Oral Oral  SpO2: 99% 100% 100% 100%  Weight:  206 lb 6.4 oz (93.6 kg)  205 lb 12.8 oz (93.4 kg)  Height:       Physical Exam General: Vital signs reviewed.  Patient is well-developed and well-nourished, in no acute distress and cooperative with exam.  Cardiovascular: RRR, 2/6 systolic murmur. Pulmonary/Chest: Clear to auscultation bilaterally, no wheezes, rales, or rhonchi. Abdominal: Soft, non-tender, non-distended, BS +  Extremities: LUE AV fistula with good thrill and bruit. No lower extremity edema bilaterally, pulses symmetric and intact bilaterally.  Skin: Warm, dry and intact.  Psychiatric: Normal mood and affect. speech and behavior is normal. Cognition and memory are grossly normal.   Assessment/Plan: Active Problems:   End stage renal disease (HCC)   Atrial fibrillation with RVR (HCC)  Atrial Fibrillation with RVR: Resolved as of 1200 on 7/24. Patient has remained in sinus rhythm since that time. Likely secondary to electrolyte abnormalities in the acute setting after HD. Electrolyte abnormalities now resolved. TSH normal. Repeat EKG this morning shows NSR. Given this is the first episode of atrial fibrillation, now resolved, and reversible cause identified, would not start on anticoagulation. CHA2DS2-VASc 1 (hypertension). However, we will check an echocardiogram to confirm stable EF. Echocardiogram from 11/2014 revealed mild LVH and normal EF of 60-65%. No significant valvular abnormalities.  -Likely discharge home today -Follow up echocardiogram  results -Start ASA 81 mg daily  -Continue home labetalol 200 mg BID  Elevated Troponins: Likely secondary to demand ischemia during Afib with RVR. Troponins have trended down from 0.18>0.15>0.12. There are ST depression in V4-V6 which when compared to prior descriptions of EKG from care everywhere, appear chronic and unchanged. Stress test from 2016 states "The baseline ECG displays  diffuse abnormal ST segments. The baseline ECG displays abnormal T waves in the inferolateral leads." Stress test was overall normal in October 2016.  -Resolved  HTN: Hypertensive in the 160-170s/80s this morning. Patient is on hydralazine 100 mg TID, labetalol 200 mg BID, and lisinopril 20 mg daily and amlodipine 10 mg daily at home.  -Continue home hydralazine 100 mg TID -Continue home labetalol 200mg  BID -Continue lisinopril 20mg  daily -Start amlodipine 10 mg daily  ESRD: On HD MWF. Completed his session yesterday. He appears euvolemic. -Nephrology following -Continue home sevelamer -Holding cinacalcet for now  Chronic normocytic anemia: Hgb 7.8 on admission>8.8 this morning. He has anemia of chronic disease. -Stable  FEN: Renal Diet VTE ppx: Subq Hep CODE: Full  Dispo: Anticipated discharge in approximately 1 day(s).   LOS: 1 day   Martyn Malay, DO PGY-3 Internal Medicine Resident Pager # [phone] 03/05/2016 11:06 AM

## 2016-03-06 DIAGNOSIS — D509 Iron deficiency anemia, unspecified: Secondary | ICD-10-CM | POA: Diagnosis not present

## 2016-03-06 DIAGNOSIS — N186 End stage renal disease: Secondary | ICD-10-CM | POA: Diagnosis not present

## 2016-03-06 DIAGNOSIS — N2581 Secondary hyperparathyroidism of renal origin: Secondary | ICD-10-CM | POA: Diagnosis not present

## 2016-03-06 DIAGNOSIS — D631 Anemia in chronic kidney disease: Secondary | ICD-10-CM | POA: Diagnosis not present

## 2016-03-06 NOTE — Discharge Summary (Signed)
Name: Thomas Adams MRN: [id number] DOB: [birth year] 55 y.o. PCP: Estill Bamberg. Megan Salon, MD  Date of Admission: 03/04/2016 11:34 AM Date of Discharge: 03/06/2016 Attending Physician: Dr. Dareen Piano  Discharge Diagnosis:  Principal Problem:   Atrial fibrillation with RVR ([place]) Active Problems:   Malignant hypertension   End stage renal disease ([place])   Discharge Medications:   Medication List    TAKE these medications   amLODipine 10 MG tablet Commonly known as:  NORVASC Take 1 tablet (10 mg total) by mouth daily.   cinacalcet 30 MG tablet Commonly known as:  SENSIPAR Take 30 mg by mouth daily as needed.   hydrALAZINE 25 MG tablet Commonly known as:  APRESOLINE Take 1 tablet (25 mg total) by mouth every 8 (eight) hours.   labetalol 200 MG tablet Commonly known as:  NORMODYNE Take 1 tablet (200 mg total) by mouth 2 (two) times daily.   lisinopril 20 MG tablet Commonly known as:  PRINIVIL,ZESTRIL Take 1 tablet by mouth daily.   sevelamer carbonate 800 MG tablet Commonly known as:  RENVELA Take 2,400 mg by mouth 3 (three) times daily.       Disposition and follow-up:   Thomas Adams was discharged from [facility] in Good condition.  At the hospital follow up visit please address:  New Onset Paroxsymal AFib with RVR: Now resolved. Secondary to underlying electrolyte abnormalities. CHADS-VASC 1. Patient is on ASA 81 mg daily. Please assess rhythm and compliance with aspirin.  ESRD on MWF dialysis with multiple electrolyte abnormalities: Discussed with renal who think this was most likely due to post-dialysis, pre-redistribution of electrolytes back into the intravascular space complicated by poor dietary intake and episode of vomiting on morning of admission. Labs now WNL. Home sevelamer was continued. Recommend follow-up basic metabolic panel. Home cinacalcet was held due to initial hypocalcemia, restarted at discharge.  HTN: Hypertensive during  admission to 180s/90s. Continued home hydralazine, labetalol, Lisinopril. Started amlodipine 10mg  daily. Please assess BP control.   Normocytic anemia: Chronicity unknown. 7.8 on admission. Hgb 6.7-7.7 2 years ago. 8.8 on discharge. Currently asymptomatic. Likely due to AOCI and ESRD. Recommend follow-up CBC.  2.  Labs / imaging needed at time of follow-up: BMET, CBC  3.  Pending labs/ test needing follow-up: None  Follow-up Appointments: Follow-up Information    Helen Hashimoto., MD. Daphane [place] on 03/18/2016.   Specialty:  Internal Medicine Why:  Appointment at 3pm on Monday 03/18/16 for hospital follow up.  Contact information: [place] [place] [id number] [place] [facility] Course by problem list: Principal Problem:   Atrial fibrillation with RVR ([place]) Active Problems:   Malignant hypertension   End stage renal disease [facility])   Thomas Adams is a 55 yo gentleman with ESRD on MWF dialysis and HTN who presents with palpitations, found in AFib with RVR, Hypokalemia 2, Hypocalcemia 5.4, Hypomagnesemia 2.1. His hospital coure by problem is as below.   New Onset Paroxsymal AFib with RVR: Likely secondary to underlying electrolyte abnormalities. Atrial Fibrillation resolved in the ED after correction of electrolyte abnormalities and cardizem gtt. Patient had sudden onset palpitations after completing dialysis, found in AFib with RVR.CHADS-VASc 1 HAS-BLED 2. Patient treated with diltiazem which controlled rate and he spontaneously converted to NSR and remained in NSR and symptomatic off diltiazem. This was his first known symptomatic episode of AFib with RVR that lasted ~2-3 hours, converted to NSR after rate control, likely  from a reversible cause (hypokalemia, hypomagnesemia). Echocardiography showed EF 60-65%, LV concentric hypertrophy with pseudonormal filling and concomitant abnormal relaxation and increased filling pressure (Grade 2 diastolic dysfunction),  Mild aortic stenosis, severely dilated LA. Evidence for anticoagulation is mixed for CHADS-VASC 1 given low enrollment and unequal attributable risk ratios. Given above, we started low dose daily aspirin. Patient will remain on labetolol which should assist with rate control and defer additional rate control to OP setting. TSH WNL and no other clear precipitants. Admission EKG showed AFib with possible U waves and borderline ST depression in lateral leads. Next morning EKG showing lateral TWI. Upon review of stress testing, echo and EKG at rest and exercise in 2016, these are old findings. His troponin elevation is likely due to demand ischemia and decreased clearance, trending down since admission value 0.18.   ESRD on MWF dialysis with multiple electrolyte abnormalities: Discussed with renal who think this was most likely due to post-dialysis, pre-redistribution of electrolytes back into the intravascular space complicated by poor dietary intake and episode of vomiting on morning of admission. Labs now WNL. Home sevelamer was continued. Recommend follow-up basic metabolic panel. Home cinacalcet was held due to initial hypocalcemia, restarted at discharge.  HTN: Hypertensive during admission to 180s/90s. Continued home hydralazine, labetalol, Lisinopril. Started amlodipine 10mg  daily.   Normocytic anemia: Chronicity unknown. 7.8 on admission. Hgb 6.7-7.7 2 years ago. 8.8 on discharge. Currently asymptomatic. Likely due to AOCI and ESRD. Recommend follow-up CBC.  Discharge Vitals:   BP 140/73 (BP Location: Right Arm)   Pulse 88   Temp 98.4 F (36.9 C) (Oral)   Resp 18   Ht 5\' 9"  (1.753 m)   Wt 205 lb 12.8 oz (93.4 kg)   SpO2 100%   BMI 30.39 kg/m   Discharge Instructions: Discharge Instructions    Call MD for:  difficulty breathing, headache or visual disturbances    Complete by:  As directed   Call MD for:  persistant nausea and vomiting    Complete by:  As directed   Call MD for:   temperature >100.4    Complete by:  As directed   Diet - low sodium heart healthy    Complete by:  As directed   Discharge instructions    Complete by:  As directed   You were hospitalized for Atrial Fibrillation. This was a brief episode and your heart rhythm is back to normal. This likely happened due to low electrolytes (potassium and magnesium) as well as due to your diet, episode of vomiting and dialysis together. Atrial fibrillation puts you at higher risk for stroke. Please start taking aspirin 81mg  every day. Please keep taking your blood pressure medications to help reduce your risk of stroke. Please continue dialysis tomorrow and follow up with your primary doctor.   Increase activity slowly    Complete by:  As directed      Signed: Martyn Malay, DO PGY-3 Internal Medicine Resident Pager # [phone] 03/06/2016 7:22 AM

## 2016-03-08 DIAGNOSIS — N186 End stage renal disease: Secondary | ICD-10-CM | POA: Diagnosis not present

## 2016-03-08 DIAGNOSIS — D631 Anemia in chronic kidney disease: Secondary | ICD-10-CM | POA: Diagnosis not present

## 2016-03-08 DIAGNOSIS — D509 Iron deficiency anemia, unspecified: Secondary | ICD-10-CM | POA: Diagnosis not present

## 2016-03-08 DIAGNOSIS — N2581 Secondary hyperparathyroidism of renal origin: Secondary | ICD-10-CM | POA: Diagnosis not present

## 2016-03-11 DIAGNOSIS — Z992 Dependence on renal dialysis: Secondary | ICD-10-CM | POA: Diagnosis not present

## 2016-03-11 DIAGNOSIS — I120 Hypertensive chronic kidney disease with stage 5 chronic kidney disease or end stage renal disease: Secondary | ICD-10-CM | POA: Diagnosis not present

## 2016-03-11 DIAGNOSIS — D631 Anemia in chronic kidney disease: Secondary | ICD-10-CM | POA: Diagnosis not present

## 2016-03-11 DIAGNOSIS — D509 Iron deficiency anemia, unspecified: Secondary | ICD-10-CM | POA: Diagnosis not present

## 2016-03-11 DIAGNOSIS — N2581 Secondary hyperparathyroidism of renal origin: Secondary | ICD-10-CM | POA: Diagnosis not present

## 2016-03-11 DIAGNOSIS — N186 End stage renal disease: Secondary | ICD-10-CM | POA: Diagnosis not present

## 2016-03-13 DIAGNOSIS — N186 End stage renal disease: Secondary | ICD-10-CM | POA: Diagnosis not present

## 2016-03-13 DIAGNOSIS — D509 Iron deficiency anemia, unspecified: Secondary | ICD-10-CM | POA: Diagnosis not present

## 2016-03-13 DIAGNOSIS — N2581 Secondary hyperparathyroidism of renal origin: Secondary | ICD-10-CM | POA: Diagnosis not present

## 2016-03-13 DIAGNOSIS — D631 Anemia in chronic kidney disease: Secondary | ICD-10-CM | POA: Diagnosis not present

## 2016-03-15 DIAGNOSIS — D631 Anemia in chronic kidney disease: Secondary | ICD-10-CM | POA: Diagnosis not present

## 2016-03-15 DIAGNOSIS — N186 End stage renal disease: Secondary | ICD-10-CM | POA: Diagnosis not present

## 2016-03-15 DIAGNOSIS — N2581 Secondary hyperparathyroidism of renal origin: Secondary | ICD-10-CM | POA: Diagnosis not present

## 2016-03-15 DIAGNOSIS — D509 Iron deficiency anemia, unspecified: Secondary | ICD-10-CM | POA: Diagnosis not present

## 2016-03-18 DIAGNOSIS — N2581 Secondary hyperparathyroidism of renal origin: Secondary | ICD-10-CM | POA: Diagnosis not present

## 2016-03-18 DIAGNOSIS — N186 End stage renal disease: Secondary | ICD-10-CM | POA: Diagnosis not present

## 2016-03-18 DIAGNOSIS — D509 Iron deficiency anemia, unspecified: Secondary | ICD-10-CM | POA: Diagnosis not present

## 2016-03-18 DIAGNOSIS — D631 Anemia in chronic kidney disease: Secondary | ICD-10-CM | POA: Diagnosis not present

## 2016-03-20 DIAGNOSIS — D631 Anemia in chronic kidney disease: Secondary | ICD-10-CM | POA: Diagnosis not present

## 2016-03-20 DIAGNOSIS — N186 End stage renal disease: Secondary | ICD-10-CM | POA: Diagnosis not present

## 2016-03-20 DIAGNOSIS — N2581 Secondary hyperparathyroidism of renal origin: Secondary | ICD-10-CM | POA: Diagnosis not present

## 2016-03-20 DIAGNOSIS — D509 Iron deficiency anemia, unspecified: Secondary | ICD-10-CM | POA: Diagnosis not present

## 2016-03-22 DIAGNOSIS — N2581 Secondary hyperparathyroidism of renal origin: Secondary | ICD-10-CM | POA: Diagnosis not present

## 2016-03-22 DIAGNOSIS — D631 Anemia in chronic kidney disease: Secondary | ICD-10-CM | POA: Diagnosis not present

## 2016-03-22 DIAGNOSIS — D509 Iron deficiency anemia, unspecified: Secondary | ICD-10-CM | POA: Diagnosis not present

## 2016-03-22 DIAGNOSIS — N186 End stage renal disease: Secondary | ICD-10-CM | POA: Diagnosis not present

## 2016-03-25 DIAGNOSIS — D631 Anemia in chronic kidney disease: Secondary | ICD-10-CM | POA: Diagnosis not present

## 2016-03-25 DIAGNOSIS — D509 Iron deficiency anemia, unspecified: Secondary | ICD-10-CM | POA: Diagnosis not present

## 2016-03-25 DIAGNOSIS — N2581 Secondary hyperparathyroidism of renal origin: Secondary | ICD-10-CM | POA: Diagnosis not present

## 2016-03-25 DIAGNOSIS — N186 End stage renal disease: Secondary | ICD-10-CM | POA: Diagnosis not present

## 2016-03-27 DIAGNOSIS — D509 Iron deficiency anemia, unspecified: Secondary | ICD-10-CM | POA: Diagnosis not present

## 2016-03-27 DIAGNOSIS — N2581 Secondary hyperparathyroidism of renal origin: Secondary | ICD-10-CM | POA: Diagnosis not present

## 2016-03-27 DIAGNOSIS — N186 End stage renal disease: Secondary | ICD-10-CM | POA: Diagnosis not present

## 2016-03-27 DIAGNOSIS — D631 Anemia in chronic kidney disease: Secondary | ICD-10-CM | POA: Diagnosis not present

## 2016-03-29 DIAGNOSIS — N2581 Secondary hyperparathyroidism of renal origin: Secondary | ICD-10-CM | POA: Diagnosis not present

## 2016-03-29 DIAGNOSIS — D631 Anemia in chronic kidney disease: Secondary | ICD-10-CM | POA: Diagnosis not present

## 2016-03-29 DIAGNOSIS — D509 Iron deficiency anemia, unspecified: Secondary | ICD-10-CM | POA: Diagnosis not present

## 2016-03-29 DIAGNOSIS — N186 End stage renal disease: Secondary | ICD-10-CM | POA: Diagnosis not present

## 2016-04-01 DIAGNOSIS — N186 End stage renal disease: Secondary | ICD-10-CM | POA: Diagnosis not present

## 2016-04-01 DIAGNOSIS — D631 Anemia in chronic kidney disease: Secondary | ICD-10-CM | POA: Diagnosis not present

## 2016-04-01 DIAGNOSIS — N2581 Secondary hyperparathyroidism of renal origin: Secondary | ICD-10-CM | POA: Diagnosis not present

## 2016-04-01 DIAGNOSIS — D509 Iron deficiency anemia, unspecified: Secondary | ICD-10-CM | POA: Diagnosis not present

## 2016-04-03 DIAGNOSIS — N186 End stage renal disease: Secondary | ICD-10-CM | POA: Diagnosis not present

## 2016-04-03 DIAGNOSIS — N2581 Secondary hyperparathyroidism of renal origin: Secondary | ICD-10-CM | POA: Diagnosis not present

## 2016-04-03 DIAGNOSIS — D509 Iron deficiency anemia, unspecified: Secondary | ICD-10-CM | POA: Diagnosis not present

## 2016-04-03 DIAGNOSIS — D631 Anemia in chronic kidney disease: Secondary | ICD-10-CM | POA: Diagnosis not present

## 2016-04-05 DIAGNOSIS — N186 End stage renal disease: Secondary | ICD-10-CM | POA: Diagnosis not present

## 2016-04-05 DIAGNOSIS — D509 Iron deficiency anemia, unspecified: Secondary | ICD-10-CM | POA: Diagnosis not present

## 2016-04-05 DIAGNOSIS — D631 Anemia in chronic kidney disease: Secondary | ICD-10-CM | POA: Diagnosis not present

## 2016-04-05 DIAGNOSIS — N2581 Secondary hyperparathyroidism of renal origin: Secondary | ICD-10-CM | POA: Diagnosis not present

## 2016-04-08 DIAGNOSIS — D509 Iron deficiency anemia, unspecified: Secondary | ICD-10-CM | POA: Diagnosis not present

## 2016-04-08 DIAGNOSIS — N2581 Secondary hyperparathyroidism of renal origin: Secondary | ICD-10-CM | POA: Diagnosis not present

## 2016-04-08 DIAGNOSIS — N186 End stage renal disease: Secondary | ICD-10-CM | POA: Diagnosis not present

## 2016-04-08 DIAGNOSIS — D631 Anemia in chronic kidney disease: Secondary | ICD-10-CM | POA: Diagnosis not present

## 2016-04-10 DIAGNOSIS — D509 Iron deficiency anemia, unspecified: Secondary | ICD-10-CM | POA: Diagnosis not present

## 2016-04-10 DIAGNOSIS — N186 End stage renal disease: Secondary | ICD-10-CM | POA: Diagnosis not present

## 2016-04-10 DIAGNOSIS — D631 Anemia in chronic kidney disease: Secondary | ICD-10-CM | POA: Diagnosis not present

## 2016-04-10 DIAGNOSIS — N2581 Secondary hyperparathyroidism of renal origin: Secondary | ICD-10-CM | POA: Diagnosis not present

## 2016-04-11 DIAGNOSIS — N186 End stage renal disease: Secondary | ICD-10-CM | POA: Diagnosis not present

## 2016-04-11 DIAGNOSIS — I120 Hypertensive chronic kidney disease with stage 5 chronic kidney disease or end stage renal disease: Secondary | ICD-10-CM | POA: Diagnosis not present

## 2016-04-11 DIAGNOSIS — Z992 Dependence on renal dialysis: Secondary | ICD-10-CM | POA: Diagnosis not present

## 2016-04-12 DIAGNOSIS — N186 End stage renal disease: Secondary | ICD-10-CM | POA: Diagnosis not present

## 2016-04-12 DIAGNOSIS — D509 Iron deficiency anemia, unspecified: Secondary | ICD-10-CM | POA: Diagnosis not present

## 2016-04-12 DIAGNOSIS — D631 Anemia in chronic kidney disease: Secondary | ICD-10-CM | POA: Diagnosis not present

## 2016-04-12 DIAGNOSIS — N2581 Secondary hyperparathyroidism of renal origin: Secondary | ICD-10-CM | POA: Diagnosis not present

## 2016-04-15 DIAGNOSIS — N186 End stage renal disease: Secondary | ICD-10-CM | POA: Diagnosis not present

## 2016-04-15 DIAGNOSIS — N2581 Secondary hyperparathyroidism of renal origin: Secondary | ICD-10-CM | POA: Diagnosis not present

## 2016-04-15 DIAGNOSIS — D509 Iron deficiency anemia, unspecified: Secondary | ICD-10-CM | POA: Diagnosis not present

## 2016-04-15 DIAGNOSIS — D631 Anemia in chronic kidney disease: Secondary | ICD-10-CM | POA: Diagnosis not present

## 2016-04-16 DIAGNOSIS — H26112 Localized traumatic opacities, left eye: Secondary | ICD-10-CM | POA: Diagnosis not present

## 2016-04-17 DIAGNOSIS — N2581 Secondary hyperparathyroidism of renal origin: Secondary | ICD-10-CM | POA: Diagnosis not present

## 2016-04-17 DIAGNOSIS — N186 End stage renal disease: Secondary | ICD-10-CM | POA: Diagnosis not present

## 2016-04-17 DIAGNOSIS — D509 Iron deficiency anemia, unspecified: Secondary | ICD-10-CM | POA: Diagnosis not present

## 2016-04-17 DIAGNOSIS — D631 Anemia in chronic kidney disease: Secondary | ICD-10-CM | POA: Diagnosis not present

## 2016-04-19 DIAGNOSIS — D509 Iron deficiency anemia, unspecified: Secondary | ICD-10-CM | POA: Diagnosis not present

## 2016-04-19 DIAGNOSIS — N2581 Secondary hyperparathyroidism of renal origin: Secondary | ICD-10-CM | POA: Diagnosis not present

## 2016-04-19 DIAGNOSIS — D631 Anemia in chronic kidney disease: Secondary | ICD-10-CM | POA: Diagnosis not present

## 2016-04-19 DIAGNOSIS — N186 End stage renal disease: Secondary | ICD-10-CM | POA: Diagnosis not present

## 2016-04-22 DIAGNOSIS — D509 Iron deficiency anemia, unspecified: Secondary | ICD-10-CM | POA: Diagnosis not present

## 2016-04-22 DIAGNOSIS — N2581 Secondary hyperparathyroidism of renal origin: Secondary | ICD-10-CM | POA: Diagnosis not present

## 2016-04-22 DIAGNOSIS — N186 End stage renal disease: Secondary | ICD-10-CM | POA: Diagnosis not present

## 2016-04-22 DIAGNOSIS — D631 Anemia in chronic kidney disease: Secondary | ICD-10-CM | POA: Diagnosis not present

## 2016-04-24 DIAGNOSIS — D631 Anemia in chronic kidney disease: Secondary | ICD-10-CM | POA: Diagnosis not present

## 2016-04-24 DIAGNOSIS — D509 Iron deficiency anemia, unspecified: Secondary | ICD-10-CM | POA: Diagnosis not present

## 2016-04-24 DIAGNOSIS — N186 End stage renal disease: Secondary | ICD-10-CM | POA: Diagnosis not present

## 2016-04-24 DIAGNOSIS — N2581 Secondary hyperparathyroidism of renal origin: Secondary | ICD-10-CM | POA: Diagnosis not present

## 2016-04-26 DIAGNOSIS — N186 End stage renal disease: Secondary | ICD-10-CM | POA: Diagnosis not present

## 2016-04-26 DIAGNOSIS — N2581 Secondary hyperparathyroidism of renal origin: Secondary | ICD-10-CM | POA: Diagnosis not present

## 2016-04-26 DIAGNOSIS — D509 Iron deficiency anemia, unspecified: Secondary | ICD-10-CM | POA: Diagnosis not present

## 2016-04-26 DIAGNOSIS — D631 Anemia in chronic kidney disease: Secondary | ICD-10-CM | POA: Diagnosis not present

## 2016-04-29 DIAGNOSIS — N186 End stage renal disease: Secondary | ICD-10-CM | POA: Diagnosis not present

## 2016-04-29 DIAGNOSIS — D509 Iron deficiency anemia, unspecified: Secondary | ICD-10-CM | POA: Diagnosis not present

## 2016-04-29 DIAGNOSIS — D631 Anemia in chronic kidney disease: Secondary | ICD-10-CM | POA: Diagnosis not present

## 2016-04-29 DIAGNOSIS — N2581 Secondary hyperparathyroidism of renal origin: Secondary | ICD-10-CM | POA: Diagnosis not present

## 2016-05-01 DIAGNOSIS — N186 End stage renal disease: Secondary | ICD-10-CM | POA: Diagnosis not present

## 2016-05-01 DIAGNOSIS — D631 Anemia in chronic kidney disease: Secondary | ICD-10-CM | POA: Diagnosis not present

## 2016-05-01 DIAGNOSIS — N2581 Secondary hyperparathyroidism of renal origin: Secondary | ICD-10-CM | POA: Diagnosis not present

## 2016-05-01 DIAGNOSIS — D509 Iron deficiency anemia, unspecified: Secondary | ICD-10-CM | POA: Diagnosis not present

## 2016-05-03 DIAGNOSIS — D509 Iron deficiency anemia, unspecified: Secondary | ICD-10-CM | POA: Diagnosis not present

## 2016-05-03 DIAGNOSIS — N186 End stage renal disease: Secondary | ICD-10-CM | POA: Diagnosis not present

## 2016-05-03 DIAGNOSIS — N2581 Secondary hyperparathyroidism of renal origin: Secondary | ICD-10-CM | POA: Diagnosis not present

## 2016-05-03 DIAGNOSIS — D631 Anemia in chronic kidney disease: Secondary | ICD-10-CM | POA: Diagnosis not present

## 2016-05-06 DIAGNOSIS — D631 Anemia in chronic kidney disease: Secondary | ICD-10-CM | POA: Diagnosis not present

## 2016-05-06 DIAGNOSIS — D509 Iron deficiency anemia, unspecified: Secondary | ICD-10-CM | POA: Diagnosis not present

## 2016-05-06 DIAGNOSIS — N186 End stage renal disease: Secondary | ICD-10-CM | POA: Diagnosis not present

## 2016-05-06 DIAGNOSIS — N2581 Secondary hyperparathyroidism of renal origin: Secondary | ICD-10-CM | POA: Diagnosis not present

## 2016-05-07 DIAGNOSIS — Z992 Dependence on renal dialysis: Secondary | ICD-10-CM | POA: Diagnosis not present

## 2016-05-07 DIAGNOSIS — H26112 Localized traumatic opacities, left eye: Secondary | ICD-10-CM | POA: Diagnosis not present

## 2016-05-07 DIAGNOSIS — G4733 Obstructive sleep apnea (adult) (pediatric): Secondary | ICD-10-CM | POA: Diagnosis not present

## 2016-05-07 DIAGNOSIS — N186 End stage renal disease: Secondary | ICD-10-CM | POA: Diagnosis not present

## 2016-05-07 DIAGNOSIS — I10 Essential (primary) hypertension: Secondary | ICD-10-CM | POA: Diagnosis not present

## 2016-05-07 DIAGNOSIS — I120 Hypertensive chronic kidney disease with stage 5 chronic kidney disease or end stage renal disease: Secondary | ICD-10-CM | POA: Diagnosis not present

## 2016-05-07 DIAGNOSIS — Z7982 Long term (current) use of aspirin: Secondary | ICD-10-CM | POA: Diagnosis not present

## 2016-05-07 DIAGNOSIS — Z87891 Personal history of nicotine dependence: Secondary | ICD-10-CM | POA: Diagnosis not present

## 2016-05-07 DIAGNOSIS — H2512 Age-related nuclear cataract, left eye: Secondary | ICD-10-CM | POA: Diagnosis not present

## 2016-05-07 DIAGNOSIS — H269 Unspecified cataract: Secondary | ICD-10-CM | POA: Diagnosis not present

## 2016-05-08 DIAGNOSIS — N2581 Secondary hyperparathyroidism of renal origin: Secondary | ICD-10-CM | POA: Diagnosis not present

## 2016-05-08 DIAGNOSIS — D631 Anemia in chronic kidney disease: Secondary | ICD-10-CM | POA: Diagnosis not present

## 2016-05-08 DIAGNOSIS — N186 End stage renal disease: Secondary | ICD-10-CM | POA: Diagnosis not present

## 2016-05-08 DIAGNOSIS — D509 Iron deficiency anemia, unspecified: Secondary | ICD-10-CM | POA: Diagnosis not present

## 2016-05-10 DIAGNOSIS — D509 Iron deficiency anemia, unspecified: Secondary | ICD-10-CM | POA: Diagnosis not present

## 2016-05-10 DIAGNOSIS — N186 End stage renal disease: Secondary | ICD-10-CM | POA: Diagnosis not present

## 2016-05-10 DIAGNOSIS — D631 Anemia in chronic kidney disease: Secondary | ICD-10-CM | POA: Diagnosis not present

## 2016-05-10 DIAGNOSIS — N2581 Secondary hyperparathyroidism of renal origin: Secondary | ICD-10-CM | POA: Diagnosis not present

## 2016-05-11 DIAGNOSIS — I120 Hypertensive chronic kidney disease with stage 5 chronic kidney disease or end stage renal disease: Secondary | ICD-10-CM | POA: Diagnosis not present

## 2016-05-11 DIAGNOSIS — Z992 Dependence on renal dialysis: Secondary | ICD-10-CM | POA: Diagnosis not present

## 2016-05-11 DIAGNOSIS — N186 End stage renal disease: Secondary | ICD-10-CM | POA: Diagnosis not present

## 2016-05-13 DIAGNOSIS — Z23 Encounter for immunization: Secondary | ICD-10-CM | POA: Diagnosis not present

## 2016-05-13 DIAGNOSIS — N2581 Secondary hyperparathyroidism of renal origin: Secondary | ICD-10-CM | POA: Diagnosis not present

## 2016-05-13 DIAGNOSIS — D631 Anemia in chronic kidney disease: Secondary | ICD-10-CM | POA: Diagnosis not present

## 2016-05-13 DIAGNOSIS — N186 End stage renal disease: Secondary | ICD-10-CM | POA: Diagnosis not present

## 2016-05-14 ENCOUNTER — Encounter: Payer: Self-pay | Admitting: Interventional Radiology

## 2016-05-15 DIAGNOSIS — Z23 Encounter for immunization: Secondary | ICD-10-CM | POA: Diagnosis not present

## 2016-05-15 DIAGNOSIS — N186 End stage renal disease: Secondary | ICD-10-CM | POA: Diagnosis not present

## 2016-05-15 DIAGNOSIS — D631 Anemia in chronic kidney disease: Secondary | ICD-10-CM | POA: Diagnosis not present

## 2016-05-15 DIAGNOSIS — N2581 Secondary hyperparathyroidism of renal origin: Secondary | ICD-10-CM | POA: Diagnosis not present

## 2016-05-17 ENCOUNTER — Other Ambulatory Visit: Payer: Self-pay | Admitting: Internal Medicine

## 2016-05-17 DIAGNOSIS — N2581 Secondary hyperparathyroidism of renal origin: Secondary | ICD-10-CM | POA: Diagnosis not present

## 2016-05-17 DIAGNOSIS — N186 End stage renal disease: Secondary | ICD-10-CM | POA: Diagnosis not present

## 2016-05-17 DIAGNOSIS — Z23 Encounter for immunization: Secondary | ICD-10-CM | POA: Diagnosis not present

## 2016-05-17 DIAGNOSIS — D631 Anemia in chronic kidney disease: Secondary | ICD-10-CM | POA: Diagnosis not present

## 2016-05-20 DIAGNOSIS — N186 End stage renal disease: Secondary | ICD-10-CM | POA: Diagnosis not present

## 2016-05-20 DIAGNOSIS — D631 Anemia in chronic kidney disease: Secondary | ICD-10-CM | POA: Diagnosis not present

## 2016-05-20 DIAGNOSIS — N2581 Secondary hyperparathyroidism of renal origin: Secondary | ICD-10-CM | POA: Diagnosis not present

## 2016-05-20 DIAGNOSIS — Z23 Encounter for immunization: Secondary | ICD-10-CM | POA: Diagnosis not present

## 2016-05-22 DIAGNOSIS — D631 Anemia in chronic kidney disease: Secondary | ICD-10-CM | POA: Diagnosis not present

## 2016-05-22 DIAGNOSIS — Z23 Encounter for immunization: Secondary | ICD-10-CM | POA: Diagnosis not present

## 2016-05-22 DIAGNOSIS — N2581 Secondary hyperparathyroidism of renal origin: Secondary | ICD-10-CM | POA: Diagnosis not present

## 2016-05-22 DIAGNOSIS — N186 End stage renal disease: Secondary | ICD-10-CM | POA: Diagnosis not present

## 2016-05-24 DIAGNOSIS — N186 End stage renal disease: Secondary | ICD-10-CM | POA: Diagnosis not present

## 2016-05-24 DIAGNOSIS — N2581 Secondary hyperparathyroidism of renal origin: Secondary | ICD-10-CM | POA: Diagnosis not present

## 2016-05-24 DIAGNOSIS — Z23 Encounter for immunization: Secondary | ICD-10-CM | POA: Diagnosis not present

## 2016-05-24 DIAGNOSIS — D631 Anemia in chronic kidney disease: Secondary | ICD-10-CM | POA: Diagnosis not present

## 2016-05-27 DIAGNOSIS — N2581 Secondary hyperparathyroidism of renal origin: Secondary | ICD-10-CM | POA: Diagnosis not present

## 2016-05-27 DIAGNOSIS — Z23 Encounter for immunization: Secondary | ICD-10-CM | POA: Diagnosis not present

## 2016-05-27 DIAGNOSIS — D631 Anemia in chronic kidney disease: Secondary | ICD-10-CM | POA: Diagnosis not present

## 2016-05-27 DIAGNOSIS — N186 End stage renal disease: Secondary | ICD-10-CM | POA: Diagnosis not present

## 2016-05-29 DIAGNOSIS — Z23 Encounter for immunization: Secondary | ICD-10-CM | POA: Diagnosis not present

## 2016-05-29 DIAGNOSIS — D631 Anemia in chronic kidney disease: Secondary | ICD-10-CM | POA: Diagnosis not present

## 2016-05-29 DIAGNOSIS — N186 End stage renal disease: Secondary | ICD-10-CM | POA: Diagnosis not present

## 2016-05-29 DIAGNOSIS — N2581 Secondary hyperparathyroidism of renal origin: Secondary | ICD-10-CM | POA: Diagnosis not present

## 2016-05-30 ENCOUNTER — Other Ambulatory Visit: Payer: Self-pay | Admitting: Internal Medicine

## 2016-05-31 DIAGNOSIS — D631 Anemia in chronic kidney disease: Secondary | ICD-10-CM | POA: Diagnosis not present

## 2016-05-31 DIAGNOSIS — Z23 Encounter for immunization: Secondary | ICD-10-CM | POA: Diagnosis not present

## 2016-05-31 DIAGNOSIS — N186 End stage renal disease: Secondary | ICD-10-CM | POA: Diagnosis not present

## 2016-05-31 DIAGNOSIS — N2581 Secondary hyperparathyroidism of renal origin: Secondary | ICD-10-CM | POA: Diagnosis not present

## 2016-06-03 DIAGNOSIS — D631 Anemia in chronic kidney disease: Secondary | ICD-10-CM | POA: Diagnosis not present

## 2016-06-03 DIAGNOSIS — N2581 Secondary hyperparathyroidism of renal origin: Secondary | ICD-10-CM | POA: Diagnosis not present

## 2016-06-03 DIAGNOSIS — N186 End stage renal disease: Secondary | ICD-10-CM | POA: Diagnosis not present

## 2016-06-03 DIAGNOSIS — Z23 Encounter for immunization: Secondary | ICD-10-CM | POA: Diagnosis not present

## 2016-06-05 DIAGNOSIS — N186 End stage renal disease: Secondary | ICD-10-CM | POA: Diagnosis not present

## 2016-06-05 DIAGNOSIS — N2581 Secondary hyperparathyroidism of renal origin: Secondary | ICD-10-CM | POA: Diagnosis not present

## 2016-06-05 DIAGNOSIS — D631 Anemia in chronic kidney disease: Secondary | ICD-10-CM | POA: Diagnosis not present

## 2016-06-05 DIAGNOSIS — Z23 Encounter for immunization: Secondary | ICD-10-CM | POA: Diagnosis not present

## 2016-06-07 DIAGNOSIS — N2581 Secondary hyperparathyroidism of renal origin: Secondary | ICD-10-CM | POA: Diagnosis not present

## 2016-06-07 DIAGNOSIS — N186 End stage renal disease: Secondary | ICD-10-CM | POA: Diagnosis not present

## 2016-06-07 DIAGNOSIS — D631 Anemia in chronic kidney disease: Secondary | ICD-10-CM | POA: Diagnosis not present

## 2016-06-07 DIAGNOSIS — Z23 Encounter for immunization: Secondary | ICD-10-CM | POA: Diagnosis not present

## 2016-06-10 DIAGNOSIS — D631 Anemia in chronic kidney disease: Secondary | ICD-10-CM | POA: Diagnosis not present

## 2016-06-10 DIAGNOSIS — Z23 Encounter for immunization: Secondary | ICD-10-CM | POA: Diagnosis not present

## 2016-06-10 DIAGNOSIS — N186 End stage renal disease: Secondary | ICD-10-CM | POA: Diagnosis not present

## 2016-06-10 DIAGNOSIS — N2581 Secondary hyperparathyroidism of renal origin: Secondary | ICD-10-CM | POA: Diagnosis not present

## 2016-06-11 DIAGNOSIS — I871 Compression of vein: Secondary | ICD-10-CM | POA: Diagnosis not present

## 2016-06-11 DIAGNOSIS — Z992 Dependence on renal dialysis: Secondary | ICD-10-CM | POA: Diagnosis not present

## 2016-06-11 DIAGNOSIS — N186 End stage renal disease: Secondary | ICD-10-CM | POA: Diagnosis not present

## 2016-06-11 DIAGNOSIS — N182 Chronic kidney disease, stage 2 (mild): Secondary | ICD-10-CM | POA: Diagnosis not present

## 2016-06-11 DIAGNOSIS — I120 Hypertensive chronic kidney disease with stage 5 chronic kidney disease or end stage renal disease: Secondary | ICD-10-CM | POA: Diagnosis not present

## 2016-06-11 DIAGNOSIS — T82858D Stenosis of vascular prosthetic devices, implants and grafts, subsequent encounter: Secondary | ICD-10-CM | POA: Diagnosis not present

## 2016-06-12 DIAGNOSIS — D509 Iron deficiency anemia, unspecified: Secondary | ICD-10-CM | POA: Diagnosis not present

## 2016-06-12 DIAGNOSIS — N186 End stage renal disease: Secondary | ICD-10-CM | POA: Diagnosis not present

## 2016-06-12 DIAGNOSIS — Z23 Encounter for immunization: Secondary | ICD-10-CM | POA: Diagnosis not present

## 2016-06-12 DIAGNOSIS — D631 Anemia in chronic kidney disease: Secondary | ICD-10-CM | POA: Diagnosis not present

## 2016-06-14 DIAGNOSIS — D509 Iron deficiency anemia, unspecified: Secondary | ICD-10-CM | POA: Diagnosis not present

## 2016-06-14 DIAGNOSIS — Z23 Encounter for immunization: Secondary | ICD-10-CM | POA: Diagnosis not present

## 2016-06-14 DIAGNOSIS — D631 Anemia in chronic kidney disease: Secondary | ICD-10-CM | POA: Diagnosis not present

## 2016-06-14 DIAGNOSIS — N186 End stage renal disease: Secondary | ICD-10-CM | POA: Diagnosis not present

## 2016-06-17 DIAGNOSIS — D631 Anemia in chronic kidney disease: Secondary | ICD-10-CM | POA: Diagnosis not present

## 2016-06-17 DIAGNOSIS — Z23 Encounter for immunization: Secondary | ICD-10-CM | POA: Diagnosis not present

## 2016-06-17 DIAGNOSIS — D509 Iron deficiency anemia, unspecified: Secondary | ICD-10-CM | POA: Diagnosis not present

## 2016-06-17 DIAGNOSIS — N186 End stage renal disease: Secondary | ICD-10-CM | POA: Diagnosis not present

## 2016-06-19 DIAGNOSIS — Z23 Encounter for immunization: Secondary | ICD-10-CM | POA: Diagnosis not present

## 2016-06-19 DIAGNOSIS — D631 Anemia in chronic kidney disease: Secondary | ICD-10-CM | POA: Diagnosis not present

## 2016-06-19 DIAGNOSIS — N186 End stage renal disease: Secondary | ICD-10-CM | POA: Diagnosis not present

## 2016-06-19 DIAGNOSIS — D509 Iron deficiency anemia, unspecified: Secondary | ICD-10-CM | POA: Diagnosis not present

## 2016-06-21 DIAGNOSIS — N186 End stage renal disease: Secondary | ICD-10-CM | POA: Diagnosis not present

## 2016-06-21 DIAGNOSIS — D631 Anemia in chronic kidney disease: Secondary | ICD-10-CM | POA: Diagnosis not present

## 2016-06-21 DIAGNOSIS — Z23 Encounter for immunization: Secondary | ICD-10-CM | POA: Diagnosis not present

## 2016-06-21 DIAGNOSIS — D509 Iron deficiency anemia, unspecified: Secondary | ICD-10-CM | POA: Diagnosis not present

## 2016-06-24 DIAGNOSIS — D631 Anemia in chronic kidney disease: Secondary | ICD-10-CM | POA: Diagnosis not present

## 2016-06-24 DIAGNOSIS — N186 End stage renal disease: Secondary | ICD-10-CM | POA: Diagnosis not present

## 2016-06-24 DIAGNOSIS — D509 Iron deficiency anemia, unspecified: Secondary | ICD-10-CM | POA: Diagnosis not present

## 2016-06-24 DIAGNOSIS — Z23 Encounter for immunization: Secondary | ICD-10-CM | POA: Diagnosis not present

## 2016-06-26 DIAGNOSIS — D509 Iron deficiency anemia, unspecified: Secondary | ICD-10-CM | POA: Diagnosis not present

## 2016-06-26 DIAGNOSIS — Z23 Encounter for immunization: Secondary | ICD-10-CM | POA: Diagnosis not present

## 2016-06-26 DIAGNOSIS — D631 Anemia in chronic kidney disease: Secondary | ICD-10-CM | POA: Diagnosis not present

## 2016-06-26 DIAGNOSIS — N186 End stage renal disease: Secondary | ICD-10-CM | POA: Diagnosis not present

## 2016-06-28 DIAGNOSIS — D509 Iron deficiency anemia, unspecified: Secondary | ICD-10-CM | POA: Diagnosis not present

## 2016-06-28 DIAGNOSIS — D631 Anemia in chronic kidney disease: Secondary | ICD-10-CM | POA: Diagnosis not present

## 2016-06-28 DIAGNOSIS — N186 End stage renal disease: Secondary | ICD-10-CM | POA: Diagnosis not present

## 2016-06-28 DIAGNOSIS — Z23 Encounter for immunization: Secondary | ICD-10-CM | POA: Diagnosis not present

## 2016-07-01 DIAGNOSIS — Z23 Encounter for immunization: Secondary | ICD-10-CM | POA: Diagnosis not present

## 2016-07-01 DIAGNOSIS — D631 Anemia in chronic kidney disease: Secondary | ICD-10-CM | POA: Diagnosis not present

## 2016-07-01 DIAGNOSIS — N186 End stage renal disease: Secondary | ICD-10-CM | POA: Diagnosis not present

## 2016-07-01 DIAGNOSIS — D509 Iron deficiency anemia, unspecified: Secondary | ICD-10-CM | POA: Diagnosis not present

## 2016-07-03 DIAGNOSIS — N186 End stage renal disease: Secondary | ICD-10-CM | POA: Diagnosis not present

## 2016-07-03 DIAGNOSIS — Z23 Encounter for immunization: Secondary | ICD-10-CM | POA: Diagnosis not present

## 2016-07-03 DIAGNOSIS — D509 Iron deficiency anemia, unspecified: Secondary | ICD-10-CM | POA: Diagnosis not present

## 2016-07-03 DIAGNOSIS — D631 Anemia in chronic kidney disease: Secondary | ICD-10-CM | POA: Diagnosis not present

## 2016-07-05 DIAGNOSIS — D509 Iron deficiency anemia, unspecified: Secondary | ICD-10-CM | POA: Diagnosis not present

## 2016-07-05 DIAGNOSIS — N186 End stage renal disease: Secondary | ICD-10-CM | POA: Diagnosis not present

## 2016-07-05 DIAGNOSIS — D631 Anemia in chronic kidney disease: Secondary | ICD-10-CM | POA: Diagnosis not present

## 2016-07-05 DIAGNOSIS — Z23 Encounter for immunization: Secondary | ICD-10-CM | POA: Diagnosis not present

## 2016-07-08 DIAGNOSIS — D631 Anemia in chronic kidney disease: Secondary | ICD-10-CM | POA: Diagnosis not present

## 2016-07-08 DIAGNOSIS — Z23 Encounter for immunization: Secondary | ICD-10-CM | POA: Diagnosis not present

## 2016-07-08 DIAGNOSIS — N186 End stage renal disease: Secondary | ICD-10-CM | POA: Diagnosis not present

## 2016-07-08 DIAGNOSIS — D509 Iron deficiency anemia, unspecified: Secondary | ICD-10-CM | POA: Diagnosis not present

## 2016-07-10 DIAGNOSIS — D631 Anemia in chronic kidney disease: Secondary | ICD-10-CM | POA: Diagnosis not present

## 2016-07-10 DIAGNOSIS — D509 Iron deficiency anemia, unspecified: Secondary | ICD-10-CM | POA: Diagnosis not present

## 2016-07-10 DIAGNOSIS — N186 End stage renal disease: Secondary | ICD-10-CM | POA: Diagnosis not present

## 2016-07-10 DIAGNOSIS — Z23 Encounter for immunization: Secondary | ICD-10-CM | POA: Diagnosis not present

## 2016-07-11 DIAGNOSIS — Z992 Dependence on renal dialysis: Secondary | ICD-10-CM | POA: Diagnosis not present

## 2016-07-11 DIAGNOSIS — N186 End stage renal disease: Secondary | ICD-10-CM | POA: Diagnosis not present

## 2016-07-11 DIAGNOSIS — I120 Hypertensive chronic kidney disease with stage 5 chronic kidney disease or end stage renal disease: Secondary | ICD-10-CM | POA: Diagnosis not present

## 2016-07-12 DIAGNOSIS — D631 Anemia in chronic kidney disease: Secondary | ICD-10-CM | POA: Diagnosis not present

## 2016-07-12 DIAGNOSIS — D509 Iron deficiency anemia, unspecified: Secondary | ICD-10-CM | POA: Diagnosis not present

## 2016-07-12 DIAGNOSIS — N186 End stage renal disease: Secondary | ICD-10-CM | POA: Diagnosis not present

## 2016-07-15 DIAGNOSIS — N186 End stage renal disease: Secondary | ICD-10-CM | POA: Diagnosis not present

## 2016-07-15 DIAGNOSIS — D509 Iron deficiency anemia, unspecified: Secondary | ICD-10-CM | POA: Diagnosis not present

## 2016-07-15 DIAGNOSIS — D631 Anemia in chronic kidney disease: Secondary | ICD-10-CM | POA: Diagnosis not present

## 2016-07-17 DIAGNOSIS — D631 Anemia in chronic kidney disease: Secondary | ICD-10-CM | POA: Diagnosis not present

## 2016-07-17 DIAGNOSIS — N186 End stage renal disease: Secondary | ICD-10-CM | POA: Diagnosis not present

## 2016-07-17 DIAGNOSIS — D509 Iron deficiency anemia, unspecified: Secondary | ICD-10-CM | POA: Diagnosis not present

## 2016-07-19 DIAGNOSIS — D509 Iron deficiency anemia, unspecified: Secondary | ICD-10-CM | POA: Diagnosis not present

## 2016-07-19 DIAGNOSIS — D631 Anemia in chronic kidney disease: Secondary | ICD-10-CM | POA: Diagnosis not present

## 2016-07-19 DIAGNOSIS — N186 End stage renal disease: Secondary | ICD-10-CM | POA: Diagnosis not present

## 2016-07-22 DIAGNOSIS — N186 End stage renal disease: Secondary | ICD-10-CM | POA: Diagnosis not present

## 2016-07-22 DIAGNOSIS — D631 Anemia in chronic kidney disease: Secondary | ICD-10-CM | POA: Diagnosis not present

## 2016-07-22 DIAGNOSIS — D509 Iron deficiency anemia, unspecified: Secondary | ICD-10-CM | POA: Diagnosis not present

## 2016-07-24 DIAGNOSIS — N186 End stage renal disease: Secondary | ICD-10-CM | POA: Diagnosis not present

## 2016-07-24 DIAGNOSIS — D509 Iron deficiency anemia, unspecified: Secondary | ICD-10-CM | POA: Diagnosis not present

## 2016-07-24 DIAGNOSIS — D631 Anemia in chronic kidney disease: Secondary | ICD-10-CM | POA: Diagnosis not present

## 2016-07-26 DIAGNOSIS — N186 End stage renal disease: Secondary | ICD-10-CM | POA: Diagnosis not present

## 2016-07-26 DIAGNOSIS — D509 Iron deficiency anemia, unspecified: Secondary | ICD-10-CM | POA: Diagnosis not present

## 2016-07-26 DIAGNOSIS — D631 Anemia in chronic kidney disease: Secondary | ICD-10-CM | POA: Diagnosis not present

## 2016-07-29 DIAGNOSIS — D631 Anemia in chronic kidney disease: Secondary | ICD-10-CM | POA: Diagnosis not present

## 2016-07-29 DIAGNOSIS — N186 End stage renal disease: Secondary | ICD-10-CM | POA: Diagnosis not present

## 2016-07-29 DIAGNOSIS — D509 Iron deficiency anemia, unspecified: Secondary | ICD-10-CM | POA: Diagnosis not present

## 2016-07-31 DIAGNOSIS — D509 Iron deficiency anemia, unspecified: Secondary | ICD-10-CM | POA: Diagnosis not present

## 2016-07-31 DIAGNOSIS — N186 End stage renal disease: Secondary | ICD-10-CM | POA: Diagnosis not present

## 2016-07-31 DIAGNOSIS — D631 Anemia in chronic kidney disease: Secondary | ICD-10-CM | POA: Diagnosis not present

## 2016-08-02 DIAGNOSIS — N186 End stage renal disease: Secondary | ICD-10-CM | POA: Diagnosis not present

## 2016-08-02 DIAGNOSIS — D509 Iron deficiency anemia, unspecified: Secondary | ICD-10-CM | POA: Diagnosis not present

## 2016-08-02 DIAGNOSIS — D631 Anemia in chronic kidney disease: Secondary | ICD-10-CM | POA: Diagnosis not present

## 2016-08-04 DIAGNOSIS — N186 End stage renal disease: Secondary | ICD-10-CM | POA: Diagnosis not present

## 2016-08-04 DIAGNOSIS — D631 Anemia in chronic kidney disease: Secondary | ICD-10-CM | POA: Diagnosis not present

## 2016-08-04 DIAGNOSIS — D509 Iron deficiency anemia, unspecified: Secondary | ICD-10-CM | POA: Diagnosis not present

## 2016-08-07 DIAGNOSIS — D509 Iron deficiency anemia, unspecified: Secondary | ICD-10-CM | POA: Diagnosis not present

## 2016-08-07 DIAGNOSIS — N186 End stage renal disease: Secondary | ICD-10-CM | POA: Diagnosis not present

## 2016-08-07 DIAGNOSIS — D631 Anemia in chronic kidney disease: Secondary | ICD-10-CM | POA: Diagnosis not present

## 2016-08-09 DIAGNOSIS — D631 Anemia in chronic kidney disease: Secondary | ICD-10-CM | POA: Diagnosis not present

## 2016-08-09 DIAGNOSIS — N186 End stage renal disease: Secondary | ICD-10-CM | POA: Diagnosis not present

## 2016-08-09 DIAGNOSIS — D509 Iron deficiency anemia, unspecified: Secondary | ICD-10-CM | POA: Diagnosis not present

## 2016-08-11 DIAGNOSIS — D509 Iron deficiency anemia, unspecified: Secondary | ICD-10-CM | POA: Diagnosis not present

## 2016-08-11 DIAGNOSIS — I120 Hypertensive chronic kidney disease with stage 5 chronic kidney disease or end stage renal disease: Secondary | ICD-10-CM | POA: Diagnosis not present

## 2016-08-11 DIAGNOSIS — D631 Anemia in chronic kidney disease: Secondary | ICD-10-CM | POA: Diagnosis not present

## 2016-08-11 DIAGNOSIS — N186 End stage renal disease: Secondary | ICD-10-CM | POA: Diagnosis not present

## 2016-08-11 DIAGNOSIS — Z992 Dependence on renal dialysis: Secondary | ICD-10-CM | POA: Diagnosis not present

## 2016-08-14 DIAGNOSIS — N186 End stage renal disease: Secondary | ICD-10-CM | POA: Diagnosis not present

## 2016-08-14 DIAGNOSIS — N2581 Secondary hyperparathyroidism of renal origin: Secondary | ICD-10-CM | POA: Diagnosis not present

## 2016-08-14 DIAGNOSIS — D631 Anemia in chronic kidney disease: Secondary | ICD-10-CM | POA: Diagnosis not present

## 2016-08-16 DIAGNOSIS — N186 End stage renal disease: Secondary | ICD-10-CM | POA: Diagnosis not present

## 2016-08-16 DIAGNOSIS — D631 Anemia in chronic kidney disease: Secondary | ICD-10-CM | POA: Diagnosis not present

## 2016-08-16 DIAGNOSIS — N2581 Secondary hyperparathyroidism of renal origin: Secondary | ICD-10-CM | POA: Diagnosis not present

## 2016-08-19 DIAGNOSIS — N2581 Secondary hyperparathyroidism of renal origin: Secondary | ICD-10-CM | POA: Diagnosis not present

## 2016-08-19 DIAGNOSIS — N186 End stage renal disease: Secondary | ICD-10-CM | POA: Diagnosis not present

## 2016-08-19 DIAGNOSIS — D631 Anemia in chronic kidney disease: Secondary | ICD-10-CM | POA: Diagnosis not present

## 2016-08-21 DIAGNOSIS — N186 End stage renal disease: Secondary | ICD-10-CM | POA: Diagnosis not present

## 2016-08-21 DIAGNOSIS — N2581 Secondary hyperparathyroidism of renal origin: Secondary | ICD-10-CM | POA: Diagnosis not present

## 2016-08-21 DIAGNOSIS — D631 Anemia in chronic kidney disease: Secondary | ICD-10-CM | POA: Diagnosis not present

## 2016-08-23 DIAGNOSIS — D631 Anemia in chronic kidney disease: Secondary | ICD-10-CM | POA: Diagnosis not present

## 2016-08-23 DIAGNOSIS — N2581 Secondary hyperparathyroidism of renal origin: Secondary | ICD-10-CM | POA: Diagnosis not present

## 2016-08-23 DIAGNOSIS — N186 End stage renal disease: Secondary | ICD-10-CM | POA: Diagnosis not present

## 2016-08-26 DIAGNOSIS — D631 Anemia in chronic kidney disease: Secondary | ICD-10-CM | POA: Diagnosis not present

## 2016-08-26 DIAGNOSIS — N2581 Secondary hyperparathyroidism of renal origin: Secondary | ICD-10-CM | POA: Diagnosis not present

## 2016-08-26 DIAGNOSIS — N186 End stage renal disease: Secondary | ICD-10-CM | POA: Diagnosis not present

## 2016-08-28 DIAGNOSIS — D631 Anemia in chronic kidney disease: Secondary | ICD-10-CM | POA: Diagnosis not present

## 2016-08-28 DIAGNOSIS — N186 End stage renal disease: Secondary | ICD-10-CM | POA: Diagnosis not present

## 2016-08-28 DIAGNOSIS — N2581 Secondary hyperparathyroidism of renal origin: Secondary | ICD-10-CM | POA: Diagnosis not present

## 2016-08-30 DIAGNOSIS — N186 End stage renal disease: Secondary | ICD-10-CM | POA: Diagnosis not present

## 2016-08-30 DIAGNOSIS — D631 Anemia in chronic kidney disease: Secondary | ICD-10-CM | POA: Diagnosis not present

## 2016-08-30 DIAGNOSIS — N2581 Secondary hyperparathyroidism of renal origin: Secondary | ICD-10-CM | POA: Diagnosis not present

## 2016-09-02 DIAGNOSIS — N186 End stage renal disease: Secondary | ICD-10-CM | POA: Diagnosis not present

## 2016-09-02 DIAGNOSIS — N2581 Secondary hyperparathyroidism of renal origin: Secondary | ICD-10-CM | POA: Diagnosis not present

## 2016-09-02 DIAGNOSIS — D631 Anemia in chronic kidney disease: Secondary | ICD-10-CM | POA: Diagnosis not present

## 2016-09-04 DIAGNOSIS — N186 End stage renal disease: Secondary | ICD-10-CM | POA: Diagnosis not present

## 2016-09-04 DIAGNOSIS — N2581 Secondary hyperparathyroidism of renal origin: Secondary | ICD-10-CM | POA: Diagnosis not present

## 2016-09-04 DIAGNOSIS — D631 Anemia in chronic kidney disease: Secondary | ICD-10-CM | POA: Diagnosis not present

## 2016-09-06 DIAGNOSIS — N2581 Secondary hyperparathyroidism of renal origin: Secondary | ICD-10-CM | POA: Diagnosis not present

## 2016-09-06 DIAGNOSIS — N186 End stage renal disease: Secondary | ICD-10-CM | POA: Diagnosis not present

## 2016-09-06 DIAGNOSIS — D631 Anemia in chronic kidney disease: Secondary | ICD-10-CM | POA: Diagnosis not present

## 2016-09-09 DIAGNOSIS — N2581 Secondary hyperparathyroidism of renal origin: Secondary | ICD-10-CM | POA: Diagnosis not present

## 2016-09-09 DIAGNOSIS — D631 Anemia in chronic kidney disease: Secondary | ICD-10-CM | POA: Diagnosis not present

## 2016-09-09 DIAGNOSIS — N186 End stage renal disease: Secondary | ICD-10-CM | POA: Diagnosis not present

## 2016-09-11 DIAGNOSIS — N186 End stage renal disease: Secondary | ICD-10-CM | POA: Diagnosis not present

## 2016-09-11 DIAGNOSIS — N2581 Secondary hyperparathyroidism of renal origin: Secondary | ICD-10-CM | POA: Diagnosis not present

## 2016-09-11 DIAGNOSIS — I120 Hypertensive chronic kidney disease with stage 5 chronic kidney disease or end stage renal disease: Secondary | ICD-10-CM | POA: Diagnosis not present

## 2016-09-11 DIAGNOSIS — D631 Anemia in chronic kidney disease: Secondary | ICD-10-CM | POA: Diagnosis not present

## 2016-09-11 DIAGNOSIS — Z992 Dependence on renal dialysis: Secondary | ICD-10-CM | POA: Diagnosis not present

## 2016-09-13 DIAGNOSIS — D631 Anemia in chronic kidney disease: Secondary | ICD-10-CM | POA: Diagnosis not present

## 2016-09-13 DIAGNOSIS — N2581 Secondary hyperparathyroidism of renal origin: Secondary | ICD-10-CM | POA: Diagnosis not present

## 2016-09-13 DIAGNOSIS — N186 End stage renal disease: Secondary | ICD-10-CM | POA: Diagnosis not present

## 2016-09-16 DIAGNOSIS — D631 Anemia in chronic kidney disease: Secondary | ICD-10-CM | POA: Diagnosis not present

## 2016-09-16 DIAGNOSIS — N2581 Secondary hyperparathyroidism of renal origin: Secondary | ICD-10-CM | POA: Diagnosis not present

## 2016-09-16 DIAGNOSIS — N186 End stage renal disease: Secondary | ICD-10-CM | POA: Diagnosis not present

## 2016-09-18 DIAGNOSIS — D631 Anemia in chronic kidney disease: Secondary | ICD-10-CM | POA: Diagnosis not present

## 2016-09-18 DIAGNOSIS — N2581 Secondary hyperparathyroidism of renal origin: Secondary | ICD-10-CM | POA: Diagnosis not present

## 2016-09-18 DIAGNOSIS — N186 End stage renal disease: Secondary | ICD-10-CM | POA: Diagnosis not present

## 2016-09-20 DIAGNOSIS — D631 Anemia in chronic kidney disease: Secondary | ICD-10-CM | POA: Diagnosis not present

## 2016-09-20 DIAGNOSIS — N2581 Secondary hyperparathyroidism of renal origin: Secondary | ICD-10-CM | POA: Diagnosis not present

## 2016-09-20 DIAGNOSIS — N186 End stage renal disease: Secondary | ICD-10-CM | POA: Diagnosis not present

## 2016-09-23 DIAGNOSIS — N2581 Secondary hyperparathyroidism of renal origin: Secondary | ICD-10-CM | POA: Diagnosis not present

## 2016-09-23 DIAGNOSIS — D631 Anemia in chronic kidney disease: Secondary | ICD-10-CM | POA: Diagnosis not present

## 2016-09-23 DIAGNOSIS — N186 End stage renal disease: Secondary | ICD-10-CM | POA: Diagnosis not present

## 2016-09-25 DIAGNOSIS — N2581 Secondary hyperparathyroidism of renal origin: Secondary | ICD-10-CM | POA: Diagnosis not present

## 2016-09-25 DIAGNOSIS — N186 End stage renal disease: Secondary | ICD-10-CM | POA: Diagnosis not present

## 2016-09-25 DIAGNOSIS — D631 Anemia in chronic kidney disease: Secondary | ICD-10-CM | POA: Diagnosis not present

## 2016-09-27 DIAGNOSIS — D631 Anemia in chronic kidney disease: Secondary | ICD-10-CM | POA: Diagnosis not present

## 2016-09-27 DIAGNOSIS — N186 End stage renal disease: Secondary | ICD-10-CM | POA: Diagnosis not present

## 2016-09-27 DIAGNOSIS — N2581 Secondary hyperparathyroidism of renal origin: Secondary | ICD-10-CM | POA: Diagnosis not present

## 2016-09-30 DIAGNOSIS — N2581 Secondary hyperparathyroidism of renal origin: Secondary | ICD-10-CM | POA: Diagnosis not present

## 2016-09-30 DIAGNOSIS — N186 End stage renal disease: Secondary | ICD-10-CM | POA: Diagnosis not present

## 2016-09-30 DIAGNOSIS — D631 Anemia in chronic kidney disease: Secondary | ICD-10-CM | POA: Diagnosis not present

## 2016-10-02 DIAGNOSIS — D631 Anemia in chronic kidney disease: Secondary | ICD-10-CM | POA: Diagnosis not present

## 2016-10-02 DIAGNOSIS — N2581 Secondary hyperparathyroidism of renal origin: Secondary | ICD-10-CM | POA: Diagnosis not present

## 2016-10-02 DIAGNOSIS — N186 End stage renal disease: Secondary | ICD-10-CM | POA: Diagnosis not present

## 2016-10-04 DIAGNOSIS — N2581 Secondary hyperparathyroidism of renal origin: Secondary | ICD-10-CM | POA: Diagnosis not present

## 2016-10-04 DIAGNOSIS — N186 End stage renal disease: Secondary | ICD-10-CM | POA: Diagnosis not present

## 2016-10-04 DIAGNOSIS — D631 Anemia in chronic kidney disease: Secondary | ICD-10-CM | POA: Diagnosis not present

## 2016-10-07 DIAGNOSIS — N2581 Secondary hyperparathyroidism of renal origin: Secondary | ICD-10-CM | POA: Diagnosis not present

## 2016-10-07 DIAGNOSIS — N186 End stage renal disease: Secondary | ICD-10-CM | POA: Diagnosis not present

## 2016-10-07 DIAGNOSIS — D631 Anemia in chronic kidney disease: Secondary | ICD-10-CM | POA: Diagnosis not present

## 2016-10-09 DIAGNOSIS — N2581 Secondary hyperparathyroidism of renal origin: Secondary | ICD-10-CM | POA: Diagnosis not present

## 2016-10-09 DIAGNOSIS — I120 Hypertensive chronic kidney disease with stage 5 chronic kidney disease or end stage renal disease: Secondary | ICD-10-CM | POA: Diagnosis not present

## 2016-10-09 DIAGNOSIS — D631 Anemia in chronic kidney disease: Secondary | ICD-10-CM | POA: Diagnosis not present

## 2016-10-09 DIAGNOSIS — Z992 Dependence on renal dialysis: Secondary | ICD-10-CM | POA: Diagnosis not present

## 2016-10-09 DIAGNOSIS — N186 End stage renal disease: Secondary | ICD-10-CM | POA: Diagnosis not present

## 2016-10-10 DIAGNOSIS — N186 End stage renal disease: Secondary | ICD-10-CM | POA: Diagnosis not present

## 2016-10-10 DIAGNOSIS — N2581 Secondary hyperparathyroidism of renal origin: Secondary | ICD-10-CM | POA: Diagnosis not present

## 2016-10-10 DIAGNOSIS — D631 Anemia in chronic kidney disease: Secondary | ICD-10-CM | POA: Diagnosis not present

## 2016-10-11 DIAGNOSIS — D631 Anemia in chronic kidney disease: Secondary | ICD-10-CM | POA: Diagnosis not present

## 2016-10-11 DIAGNOSIS — N186 End stage renal disease: Secondary | ICD-10-CM | POA: Diagnosis not present

## 2016-10-11 DIAGNOSIS — N2581 Secondary hyperparathyroidism of renal origin: Secondary | ICD-10-CM | POA: Diagnosis not present

## 2016-10-14 DIAGNOSIS — N2581 Secondary hyperparathyroidism of renal origin: Secondary | ICD-10-CM | POA: Diagnosis not present

## 2016-10-14 DIAGNOSIS — N186 End stage renal disease: Secondary | ICD-10-CM | POA: Diagnosis not present

## 2016-10-14 DIAGNOSIS — D631 Anemia in chronic kidney disease: Secondary | ICD-10-CM | POA: Diagnosis not present

## 2016-10-16 DIAGNOSIS — N186 End stage renal disease: Secondary | ICD-10-CM | POA: Diagnosis not present

## 2016-10-16 DIAGNOSIS — D631 Anemia in chronic kidney disease: Secondary | ICD-10-CM | POA: Diagnosis not present

## 2016-10-16 DIAGNOSIS — N2581 Secondary hyperparathyroidism of renal origin: Secondary | ICD-10-CM | POA: Diagnosis not present

## 2016-10-18 DIAGNOSIS — N2581 Secondary hyperparathyroidism of renal origin: Secondary | ICD-10-CM | POA: Diagnosis not present

## 2016-10-18 DIAGNOSIS — D631 Anemia in chronic kidney disease: Secondary | ICD-10-CM | POA: Diagnosis not present

## 2016-10-18 DIAGNOSIS — N186 End stage renal disease: Secondary | ICD-10-CM | POA: Diagnosis not present

## 2016-10-21 DIAGNOSIS — N2581 Secondary hyperparathyroidism of renal origin: Secondary | ICD-10-CM | POA: Diagnosis not present

## 2016-10-21 DIAGNOSIS — D631 Anemia in chronic kidney disease: Secondary | ICD-10-CM | POA: Diagnosis not present

## 2016-10-21 DIAGNOSIS — N186 End stage renal disease: Secondary | ICD-10-CM | POA: Diagnosis not present

## 2016-10-23 DIAGNOSIS — N2581 Secondary hyperparathyroidism of renal origin: Secondary | ICD-10-CM | POA: Diagnosis not present

## 2016-10-23 DIAGNOSIS — N186 End stage renal disease: Secondary | ICD-10-CM | POA: Diagnosis not present

## 2016-10-23 DIAGNOSIS — D631 Anemia in chronic kidney disease: Secondary | ICD-10-CM | POA: Diagnosis not present

## 2016-10-25 DIAGNOSIS — N2581 Secondary hyperparathyroidism of renal origin: Secondary | ICD-10-CM | POA: Diagnosis not present

## 2016-10-25 DIAGNOSIS — D631 Anemia in chronic kidney disease: Secondary | ICD-10-CM | POA: Diagnosis not present

## 2016-10-25 DIAGNOSIS — N186 End stage renal disease: Secondary | ICD-10-CM | POA: Diagnosis not present

## 2016-10-28 DIAGNOSIS — N2581 Secondary hyperparathyroidism of renal origin: Secondary | ICD-10-CM | POA: Diagnosis not present

## 2016-10-28 DIAGNOSIS — N186 End stage renal disease: Secondary | ICD-10-CM | POA: Diagnosis not present

## 2016-10-28 DIAGNOSIS — D631 Anemia in chronic kidney disease: Secondary | ICD-10-CM | POA: Diagnosis not present

## 2016-10-30 DIAGNOSIS — N2581 Secondary hyperparathyroidism of renal origin: Secondary | ICD-10-CM | POA: Diagnosis not present

## 2016-10-30 DIAGNOSIS — N186 End stage renal disease: Secondary | ICD-10-CM | POA: Diagnosis not present

## 2016-10-30 DIAGNOSIS — D631 Anemia in chronic kidney disease: Secondary | ICD-10-CM | POA: Diagnosis not present

## 2016-11-01 DIAGNOSIS — D631 Anemia in chronic kidney disease: Secondary | ICD-10-CM | POA: Diagnosis not present

## 2016-11-01 DIAGNOSIS — N2581 Secondary hyperparathyroidism of renal origin: Secondary | ICD-10-CM | POA: Diagnosis not present

## 2016-11-01 DIAGNOSIS — N186 End stage renal disease: Secondary | ICD-10-CM | POA: Diagnosis not present

## 2016-11-04 DIAGNOSIS — D631 Anemia in chronic kidney disease: Secondary | ICD-10-CM | POA: Diagnosis not present

## 2016-11-04 DIAGNOSIS — N2581 Secondary hyperparathyroidism of renal origin: Secondary | ICD-10-CM | POA: Diagnosis not present

## 2016-11-04 DIAGNOSIS — N186 End stage renal disease: Secondary | ICD-10-CM | POA: Diagnosis not present

## 2016-11-06 DIAGNOSIS — D631 Anemia in chronic kidney disease: Secondary | ICD-10-CM | POA: Diagnosis not present

## 2016-11-06 DIAGNOSIS — N2581 Secondary hyperparathyroidism of renal origin: Secondary | ICD-10-CM | POA: Diagnosis not present

## 2016-11-06 DIAGNOSIS — N186 End stage renal disease: Secondary | ICD-10-CM | POA: Diagnosis not present

## 2016-11-07 DIAGNOSIS — Z79899 Other long term (current) drug therapy: Secondary | ICD-10-CM | POA: Diagnosis not present

## 2016-11-07 DIAGNOSIS — K219 Gastro-esophageal reflux disease without esophagitis: Secondary | ICD-10-CM | POA: Diagnosis not present

## 2016-11-07 DIAGNOSIS — Z6829 Body mass index (BMI) 29.0-29.9, adult: Secondary | ICD-10-CM | POA: Diagnosis not present

## 2016-11-07 DIAGNOSIS — Z1389 Encounter for screening for other disorder: Secondary | ICD-10-CM | POA: Diagnosis not present

## 2016-11-07 DIAGNOSIS — N186 End stage renal disease: Secondary | ICD-10-CM | POA: Diagnosis not present

## 2016-11-07 DIAGNOSIS — I10 Essential (primary) hypertension: Secondary | ICD-10-CM | POA: Diagnosis not present

## 2016-11-08 DIAGNOSIS — N2581 Secondary hyperparathyroidism of renal origin: Secondary | ICD-10-CM | POA: Diagnosis not present

## 2016-11-08 DIAGNOSIS — D631 Anemia in chronic kidney disease: Secondary | ICD-10-CM | POA: Diagnosis not present

## 2016-11-08 DIAGNOSIS — N186 End stage renal disease: Secondary | ICD-10-CM | POA: Diagnosis not present

## 2016-11-09 DIAGNOSIS — I120 Hypertensive chronic kidney disease with stage 5 chronic kidney disease or end stage renal disease: Secondary | ICD-10-CM | POA: Diagnosis not present

## 2016-11-09 DIAGNOSIS — Z992 Dependence on renal dialysis: Secondary | ICD-10-CM | POA: Diagnosis not present

## 2016-11-09 DIAGNOSIS — N186 End stage renal disease: Secondary | ICD-10-CM | POA: Diagnosis not present

## 2016-11-11 DIAGNOSIS — N2581 Secondary hyperparathyroidism of renal origin: Secondary | ICD-10-CM | POA: Diagnosis not present

## 2016-11-11 DIAGNOSIS — N186 End stage renal disease: Secondary | ICD-10-CM | POA: Diagnosis not present

## 2016-11-11 DIAGNOSIS — D631 Anemia in chronic kidney disease: Secondary | ICD-10-CM | POA: Diagnosis not present

## 2016-11-13 DIAGNOSIS — N2581 Secondary hyperparathyroidism of renal origin: Secondary | ICD-10-CM | POA: Diagnosis not present

## 2016-11-13 DIAGNOSIS — N186 End stage renal disease: Secondary | ICD-10-CM | POA: Diagnosis not present

## 2016-11-13 DIAGNOSIS — D631 Anemia in chronic kidney disease: Secondary | ICD-10-CM | POA: Diagnosis not present

## 2016-11-15 DIAGNOSIS — N2581 Secondary hyperparathyroidism of renal origin: Secondary | ICD-10-CM | POA: Diagnosis not present

## 2016-11-15 DIAGNOSIS — N186 End stage renal disease: Secondary | ICD-10-CM | POA: Diagnosis not present

## 2016-11-15 DIAGNOSIS — D631 Anemia in chronic kidney disease: Secondary | ICD-10-CM | POA: Diagnosis not present

## 2016-11-18 DIAGNOSIS — N2581 Secondary hyperparathyroidism of renal origin: Secondary | ICD-10-CM | POA: Diagnosis not present

## 2016-11-18 DIAGNOSIS — N186 End stage renal disease: Secondary | ICD-10-CM | POA: Diagnosis not present

## 2016-11-18 DIAGNOSIS — D631 Anemia in chronic kidney disease: Secondary | ICD-10-CM | POA: Diagnosis not present

## 2016-11-20 DIAGNOSIS — D631 Anemia in chronic kidney disease: Secondary | ICD-10-CM | POA: Diagnosis not present

## 2016-11-20 DIAGNOSIS — N2581 Secondary hyperparathyroidism of renal origin: Secondary | ICD-10-CM | POA: Diagnosis not present

## 2016-11-20 DIAGNOSIS — N186 End stage renal disease: Secondary | ICD-10-CM | POA: Diagnosis not present

## 2016-11-22 DIAGNOSIS — N2581 Secondary hyperparathyroidism of renal origin: Secondary | ICD-10-CM | POA: Diagnosis not present

## 2016-11-22 DIAGNOSIS — D631 Anemia in chronic kidney disease: Secondary | ICD-10-CM | POA: Diagnosis not present

## 2016-11-22 DIAGNOSIS — N186 End stage renal disease: Secondary | ICD-10-CM | POA: Diagnosis not present

## 2016-11-25 DIAGNOSIS — D631 Anemia in chronic kidney disease: Secondary | ICD-10-CM | POA: Diagnosis not present

## 2016-11-25 DIAGNOSIS — N2581 Secondary hyperparathyroidism of renal origin: Secondary | ICD-10-CM | POA: Diagnosis not present

## 2016-11-25 DIAGNOSIS — N186 End stage renal disease: Secondary | ICD-10-CM | POA: Diagnosis not present

## 2016-11-27 DIAGNOSIS — D631 Anemia in chronic kidney disease: Secondary | ICD-10-CM | POA: Diagnosis not present

## 2016-11-27 DIAGNOSIS — N2581 Secondary hyperparathyroidism of renal origin: Secondary | ICD-10-CM | POA: Diagnosis not present

## 2016-11-27 DIAGNOSIS — N186 End stage renal disease: Secondary | ICD-10-CM | POA: Diagnosis not present

## 2016-11-29 DIAGNOSIS — N186 End stage renal disease: Secondary | ICD-10-CM | POA: Diagnosis not present

## 2016-11-29 DIAGNOSIS — D631 Anemia in chronic kidney disease: Secondary | ICD-10-CM | POA: Diagnosis not present

## 2016-11-29 DIAGNOSIS — N2581 Secondary hyperparathyroidism of renal origin: Secondary | ICD-10-CM | POA: Diagnosis not present

## 2016-12-02 DIAGNOSIS — N186 End stage renal disease: Secondary | ICD-10-CM | POA: Diagnosis not present

## 2016-12-02 DIAGNOSIS — N2581 Secondary hyperparathyroidism of renal origin: Secondary | ICD-10-CM | POA: Diagnosis not present

## 2016-12-02 DIAGNOSIS — D631 Anemia in chronic kidney disease: Secondary | ICD-10-CM | POA: Diagnosis not present

## 2016-12-04 DIAGNOSIS — D631 Anemia in chronic kidney disease: Secondary | ICD-10-CM | POA: Diagnosis not present

## 2016-12-04 DIAGNOSIS — N186 End stage renal disease: Secondary | ICD-10-CM | POA: Diagnosis not present

## 2016-12-04 DIAGNOSIS — N2581 Secondary hyperparathyroidism of renal origin: Secondary | ICD-10-CM | POA: Diagnosis not present

## 2016-12-06 DIAGNOSIS — D631 Anemia in chronic kidney disease: Secondary | ICD-10-CM | POA: Diagnosis not present

## 2016-12-06 DIAGNOSIS — N2581 Secondary hyperparathyroidism of renal origin: Secondary | ICD-10-CM | POA: Diagnosis not present

## 2016-12-06 DIAGNOSIS — N186 End stage renal disease: Secondary | ICD-10-CM | POA: Diagnosis not present

## 2016-12-09 DIAGNOSIS — D631 Anemia in chronic kidney disease: Secondary | ICD-10-CM | POA: Diagnosis not present

## 2016-12-09 DIAGNOSIS — I120 Hypertensive chronic kidney disease with stage 5 chronic kidney disease or end stage renal disease: Secondary | ICD-10-CM | POA: Diagnosis not present

## 2016-12-09 DIAGNOSIS — Z992 Dependence on renal dialysis: Secondary | ICD-10-CM | POA: Diagnosis not present

## 2016-12-09 DIAGNOSIS — N2581 Secondary hyperparathyroidism of renal origin: Secondary | ICD-10-CM | POA: Diagnosis not present

## 2016-12-09 DIAGNOSIS — N186 End stage renal disease: Secondary | ICD-10-CM | POA: Diagnosis not present

## 2016-12-11 DIAGNOSIS — N186 End stage renal disease: Secondary | ICD-10-CM | POA: Diagnosis not present

## 2016-12-11 DIAGNOSIS — D631 Anemia in chronic kidney disease: Secondary | ICD-10-CM | POA: Diagnosis not present

## 2016-12-11 DIAGNOSIS — N2581 Secondary hyperparathyroidism of renal origin: Secondary | ICD-10-CM | POA: Diagnosis not present

## 2016-12-11 DIAGNOSIS — D509 Iron deficiency anemia, unspecified: Secondary | ICD-10-CM | POA: Diagnosis not present

## 2016-12-13 DIAGNOSIS — D631 Anemia in chronic kidney disease: Secondary | ICD-10-CM | POA: Diagnosis not present

## 2016-12-13 DIAGNOSIS — N186 End stage renal disease: Secondary | ICD-10-CM | POA: Diagnosis not present

## 2016-12-13 DIAGNOSIS — N2581 Secondary hyperparathyroidism of renal origin: Secondary | ICD-10-CM | POA: Diagnosis not present

## 2016-12-13 DIAGNOSIS — D509 Iron deficiency anemia, unspecified: Secondary | ICD-10-CM | POA: Diagnosis not present

## 2016-12-16 DIAGNOSIS — N186 End stage renal disease: Secondary | ICD-10-CM | POA: Diagnosis not present

## 2016-12-16 DIAGNOSIS — D631 Anemia in chronic kidney disease: Secondary | ICD-10-CM | POA: Diagnosis not present

## 2016-12-16 DIAGNOSIS — N2581 Secondary hyperparathyroidism of renal origin: Secondary | ICD-10-CM | POA: Diagnosis not present

## 2016-12-16 DIAGNOSIS — D509 Iron deficiency anemia, unspecified: Secondary | ICD-10-CM | POA: Diagnosis not present

## 2016-12-18 DIAGNOSIS — N186 End stage renal disease: Secondary | ICD-10-CM | POA: Diagnosis not present

## 2016-12-18 DIAGNOSIS — D509 Iron deficiency anemia, unspecified: Secondary | ICD-10-CM | POA: Diagnosis not present

## 2016-12-18 DIAGNOSIS — D631 Anemia in chronic kidney disease: Secondary | ICD-10-CM | POA: Diagnosis not present

## 2016-12-18 DIAGNOSIS — N2581 Secondary hyperparathyroidism of renal origin: Secondary | ICD-10-CM | POA: Diagnosis not present

## 2016-12-20 DIAGNOSIS — N2581 Secondary hyperparathyroidism of renal origin: Secondary | ICD-10-CM | POA: Diagnosis not present

## 2016-12-20 DIAGNOSIS — D631 Anemia in chronic kidney disease: Secondary | ICD-10-CM | POA: Diagnosis not present

## 2016-12-20 DIAGNOSIS — N186 End stage renal disease: Secondary | ICD-10-CM | POA: Diagnosis not present

## 2016-12-20 DIAGNOSIS — D509 Iron deficiency anemia, unspecified: Secondary | ICD-10-CM | POA: Diagnosis not present

## 2016-12-23 DIAGNOSIS — N186 End stage renal disease: Secondary | ICD-10-CM | POA: Diagnosis not present

## 2016-12-23 DIAGNOSIS — D509 Iron deficiency anemia, unspecified: Secondary | ICD-10-CM | POA: Diagnosis not present

## 2016-12-23 DIAGNOSIS — N2581 Secondary hyperparathyroidism of renal origin: Secondary | ICD-10-CM | POA: Diagnosis not present

## 2016-12-23 DIAGNOSIS — D631 Anemia in chronic kidney disease: Secondary | ICD-10-CM | POA: Diagnosis not present

## 2016-12-25 DIAGNOSIS — D509 Iron deficiency anemia, unspecified: Secondary | ICD-10-CM | POA: Diagnosis not present

## 2016-12-25 DIAGNOSIS — N2581 Secondary hyperparathyroidism of renal origin: Secondary | ICD-10-CM | POA: Diagnosis not present

## 2016-12-25 DIAGNOSIS — N186 End stage renal disease: Secondary | ICD-10-CM | POA: Diagnosis not present

## 2016-12-25 DIAGNOSIS — D631 Anemia in chronic kidney disease: Secondary | ICD-10-CM | POA: Diagnosis not present

## 2016-12-27 DIAGNOSIS — D631 Anemia in chronic kidney disease: Secondary | ICD-10-CM | POA: Diagnosis not present

## 2016-12-27 DIAGNOSIS — N186 End stage renal disease: Secondary | ICD-10-CM | POA: Diagnosis not present

## 2016-12-27 DIAGNOSIS — D509 Iron deficiency anemia, unspecified: Secondary | ICD-10-CM | POA: Diagnosis not present

## 2016-12-27 DIAGNOSIS — N2581 Secondary hyperparathyroidism of renal origin: Secondary | ICD-10-CM | POA: Diagnosis not present

## 2016-12-30 DIAGNOSIS — D509 Iron deficiency anemia, unspecified: Secondary | ICD-10-CM | POA: Diagnosis not present

## 2016-12-30 DIAGNOSIS — N186 End stage renal disease: Secondary | ICD-10-CM | POA: Diagnosis not present

## 2016-12-30 DIAGNOSIS — N2581 Secondary hyperparathyroidism of renal origin: Secondary | ICD-10-CM | POA: Diagnosis not present

## 2016-12-30 DIAGNOSIS — D631 Anemia in chronic kidney disease: Secondary | ICD-10-CM | POA: Diagnosis not present

## 2017-01-01 DIAGNOSIS — N186 End stage renal disease: Secondary | ICD-10-CM | POA: Diagnosis not present

## 2017-01-01 DIAGNOSIS — D631 Anemia in chronic kidney disease: Secondary | ICD-10-CM | POA: Diagnosis not present

## 2017-01-01 DIAGNOSIS — D509 Iron deficiency anemia, unspecified: Secondary | ICD-10-CM | POA: Diagnosis not present

## 2017-01-01 DIAGNOSIS — N2581 Secondary hyperparathyroidism of renal origin: Secondary | ICD-10-CM | POA: Diagnosis not present

## 2017-01-03 DIAGNOSIS — N186 End stage renal disease: Secondary | ICD-10-CM | POA: Diagnosis not present

## 2017-01-03 DIAGNOSIS — D509 Iron deficiency anemia, unspecified: Secondary | ICD-10-CM | POA: Diagnosis not present

## 2017-01-03 DIAGNOSIS — D631 Anemia in chronic kidney disease: Secondary | ICD-10-CM | POA: Diagnosis not present

## 2017-01-03 DIAGNOSIS — N2581 Secondary hyperparathyroidism of renal origin: Secondary | ICD-10-CM | POA: Diagnosis not present

## 2017-01-06 DIAGNOSIS — N2581 Secondary hyperparathyroidism of renal origin: Secondary | ICD-10-CM | POA: Diagnosis not present

## 2017-01-06 DIAGNOSIS — N186 End stage renal disease: Secondary | ICD-10-CM | POA: Diagnosis not present

## 2017-01-06 DIAGNOSIS — D509 Iron deficiency anemia, unspecified: Secondary | ICD-10-CM | POA: Diagnosis not present

## 2017-01-06 DIAGNOSIS — D631 Anemia in chronic kidney disease: Secondary | ICD-10-CM | POA: Diagnosis not present

## 2017-01-08 DIAGNOSIS — D631 Anemia in chronic kidney disease: Secondary | ICD-10-CM | POA: Diagnosis not present

## 2017-01-08 DIAGNOSIS — N2581 Secondary hyperparathyroidism of renal origin: Secondary | ICD-10-CM | POA: Diagnosis not present

## 2017-01-08 DIAGNOSIS — D509 Iron deficiency anemia, unspecified: Secondary | ICD-10-CM | POA: Diagnosis not present

## 2017-01-08 DIAGNOSIS — N186 End stage renal disease: Secondary | ICD-10-CM | POA: Diagnosis not present

## 2017-01-09 DIAGNOSIS — I120 Hypertensive chronic kidney disease with stage 5 chronic kidney disease or end stage renal disease: Secondary | ICD-10-CM | POA: Diagnosis not present

## 2017-01-09 DIAGNOSIS — Z992 Dependence on renal dialysis: Secondary | ICD-10-CM | POA: Diagnosis not present

## 2017-01-09 DIAGNOSIS — N186 End stage renal disease: Secondary | ICD-10-CM | POA: Diagnosis not present

## 2017-01-10 DIAGNOSIS — N2581 Secondary hyperparathyroidism of renal origin: Secondary | ICD-10-CM | POA: Diagnosis not present

## 2017-01-10 DIAGNOSIS — N186 End stage renal disease: Secondary | ICD-10-CM | POA: Diagnosis not present

## 2017-01-13 DIAGNOSIS — N186 End stage renal disease: Secondary | ICD-10-CM | POA: Diagnosis not present

## 2017-01-13 DIAGNOSIS — N2581 Secondary hyperparathyroidism of renal origin: Secondary | ICD-10-CM | POA: Diagnosis not present

## 2017-01-15 DIAGNOSIS — N2581 Secondary hyperparathyroidism of renal origin: Secondary | ICD-10-CM | POA: Diagnosis not present

## 2017-01-15 DIAGNOSIS — N186 End stage renal disease: Secondary | ICD-10-CM | POA: Diagnosis not present

## 2017-01-17 DIAGNOSIS — N2581 Secondary hyperparathyroidism of renal origin: Secondary | ICD-10-CM | POA: Diagnosis not present

## 2017-01-17 DIAGNOSIS — N186 End stage renal disease: Secondary | ICD-10-CM | POA: Diagnosis not present

## 2017-01-20 DIAGNOSIS — N2581 Secondary hyperparathyroidism of renal origin: Secondary | ICD-10-CM | POA: Diagnosis not present

## 2017-01-20 DIAGNOSIS — N186 End stage renal disease: Secondary | ICD-10-CM | POA: Diagnosis not present

## 2017-01-22 DIAGNOSIS — N2581 Secondary hyperparathyroidism of renal origin: Secondary | ICD-10-CM | POA: Diagnosis not present

## 2017-01-22 DIAGNOSIS — N186 End stage renal disease: Secondary | ICD-10-CM | POA: Diagnosis not present

## 2017-01-24 DIAGNOSIS — N2581 Secondary hyperparathyroidism of renal origin: Secondary | ICD-10-CM | POA: Diagnosis not present

## 2017-01-24 DIAGNOSIS — N186 End stage renal disease: Secondary | ICD-10-CM | POA: Diagnosis not present

## 2017-01-27 DIAGNOSIS — N2581 Secondary hyperparathyroidism of renal origin: Secondary | ICD-10-CM | POA: Diagnosis not present

## 2017-01-27 DIAGNOSIS — N186 End stage renal disease: Secondary | ICD-10-CM | POA: Diagnosis not present

## 2017-01-29 DIAGNOSIS — N186 End stage renal disease: Secondary | ICD-10-CM | POA: Diagnosis not present

## 2017-01-29 DIAGNOSIS — N2581 Secondary hyperparathyroidism of renal origin: Secondary | ICD-10-CM | POA: Diagnosis not present

## 2017-01-31 DIAGNOSIS — N186 End stage renal disease: Secondary | ICD-10-CM | POA: Diagnosis not present

## 2017-01-31 DIAGNOSIS — N2581 Secondary hyperparathyroidism of renal origin: Secondary | ICD-10-CM | POA: Diagnosis not present

## 2017-02-03 DIAGNOSIS — N2581 Secondary hyperparathyroidism of renal origin: Secondary | ICD-10-CM | POA: Diagnosis not present

## 2017-02-03 DIAGNOSIS — N186 End stage renal disease: Secondary | ICD-10-CM | POA: Diagnosis not present

## 2017-02-05 DIAGNOSIS — N186 End stage renal disease: Secondary | ICD-10-CM | POA: Diagnosis not present

## 2017-02-05 DIAGNOSIS — N2581 Secondary hyperparathyroidism of renal origin: Secondary | ICD-10-CM | POA: Diagnosis not present

## 2017-02-07 DIAGNOSIS — N2581 Secondary hyperparathyroidism of renal origin: Secondary | ICD-10-CM | POA: Diagnosis not present

## 2017-02-07 DIAGNOSIS — N186 End stage renal disease: Secondary | ICD-10-CM | POA: Diagnosis not present

## 2017-02-08 DIAGNOSIS — I120 Hypertensive chronic kidney disease with stage 5 chronic kidney disease or end stage renal disease: Secondary | ICD-10-CM | POA: Diagnosis not present

## 2017-02-08 DIAGNOSIS — Z992 Dependence on renal dialysis: Secondary | ICD-10-CM | POA: Diagnosis not present

## 2017-02-08 DIAGNOSIS — N186 End stage renal disease: Secondary | ICD-10-CM | POA: Diagnosis not present

## 2017-02-10 DIAGNOSIS — D631 Anemia in chronic kidney disease: Secondary | ICD-10-CM | POA: Diagnosis not present

## 2017-02-10 DIAGNOSIS — N186 End stage renal disease: Secondary | ICD-10-CM | POA: Diagnosis not present

## 2017-02-10 DIAGNOSIS — N2581 Secondary hyperparathyroidism of renal origin: Secondary | ICD-10-CM | POA: Diagnosis not present

## 2017-02-12 DIAGNOSIS — N2581 Secondary hyperparathyroidism of renal origin: Secondary | ICD-10-CM | POA: Diagnosis not present

## 2017-02-12 DIAGNOSIS — D631 Anemia in chronic kidney disease: Secondary | ICD-10-CM | POA: Diagnosis not present

## 2017-02-12 DIAGNOSIS — N186 End stage renal disease: Secondary | ICD-10-CM | POA: Diagnosis not present

## 2017-02-14 DIAGNOSIS — D631 Anemia in chronic kidney disease: Secondary | ICD-10-CM | POA: Diagnosis not present

## 2017-02-14 DIAGNOSIS — N186 End stage renal disease: Secondary | ICD-10-CM | POA: Diagnosis not present

## 2017-02-14 DIAGNOSIS — N2581 Secondary hyperparathyroidism of renal origin: Secondary | ICD-10-CM | POA: Diagnosis not present

## 2017-02-17 DIAGNOSIS — D631 Anemia in chronic kidney disease: Secondary | ICD-10-CM | POA: Diagnosis not present

## 2017-02-17 DIAGNOSIS — N2581 Secondary hyperparathyroidism of renal origin: Secondary | ICD-10-CM | POA: Diagnosis not present

## 2017-02-17 DIAGNOSIS — N186 End stage renal disease: Secondary | ICD-10-CM | POA: Diagnosis not present

## 2017-02-19 DIAGNOSIS — D631 Anemia in chronic kidney disease: Secondary | ICD-10-CM | POA: Diagnosis not present

## 2017-02-19 DIAGNOSIS — N2581 Secondary hyperparathyroidism of renal origin: Secondary | ICD-10-CM | POA: Diagnosis not present

## 2017-02-19 DIAGNOSIS — N186 End stage renal disease: Secondary | ICD-10-CM | POA: Diagnosis not present

## 2017-02-21 DIAGNOSIS — D631 Anemia in chronic kidney disease: Secondary | ICD-10-CM | POA: Diagnosis not present

## 2017-02-21 DIAGNOSIS — N2581 Secondary hyperparathyroidism of renal origin: Secondary | ICD-10-CM | POA: Diagnosis not present

## 2017-02-21 DIAGNOSIS — N186 End stage renal disease: Secondary | ICD-10-CM | POA: Diagnosis not present

## 2017-02-24 DIAGNOSIS — N2581 Secondary hyperparathyroidism of renal origin: Secondary | ICD-10-CM | POA: Diagnosis not present

## 2017-02-24 DIAGNOSIS — D631 Anemia in chronic kidney disease: Secondary | ICD-10-CM | POA: Diagnosis not present

## 2017-02-24 DIAGNOSIS — N186 End stage renal disease: Secondary | ICD-10-CM | POA: Diagnosis not present

## 2017-02-26 DIAGNOSIS — D631 Anemia in chronic kidney disease: Secondary | ICD-10-CM | POA: Diagnosis not present

## 2017-02-26 DIAGNOSIS — N186 End stage renal disease: Secondary | ICD-10-CM | POA: Diagnosis not present

## 2017-02-26 DIAGNOSIS — N2581 Secondary hyperparathyroidism of renal origin: Secondary | ICD-10-CM | POA: Diagnosis not present

## 2017-02-28 DIAGNOSIS — N2581 Secondary hyperparathyroidism of renal origin: Secondary | ICD-10-CM | POA: Diagnosis not present

## 2017-02-28 DIAGNOSIS — D631 Anemia in chronic kidney disease: Secondary | ICD-10-CM | POA: Diagnosis not present

## 2017-02-28 DIAGNOSIS — N186 End stage renal disease: Secondary | ICD-10-CM | POA: Diagnosis not present

## 2017-03-03 DIAGNOSIS — N2581 Secondary hyperparathyroidism of renal origin: Secondary | ICD-10-CM | POA: Diagnosis not present

## 2017-03-03 DIAGNOSIS — D631 Anemia in chronic kidney disease: Secondary | ICD-10-CM | POA: Diagnosis not present

## 2017-03-03 DIAGNOSIS — N186 End stage renal disease: Secondary | ICD-10-CM | POA: Diagnosis not present

## 2017-03-05 DIAGNOSIS — N2581 Secondary hyperparathyroidism of renal origin: Secondary | ICD-10-CM | POA: Diagnosis not present

## 2017-03-05 DIAGNOSIS — D631 Anemia in chronic kidney disease: Secondary | ICD-10-CM | POA: Diagnosis not present

## 2017-03-05 DIAGNOSIS — N186 End stage renal disease: Secondary | ICD-10-CM | POA: Diagnosis not present

## 2017-03-07 DIAGNOSIS — N186 End stage renal disease: Secondary | ICD-10-CM | POA: Diagnosis not present

## 2017-03-07 DIAGNOSIS — D631 Anemia in chronic kidney disease: Secondary | ICD-10-CM | POA: Diagnosis not present

## 2017-03-07 DIAGNOSIS — N2581 Secondary hyperparathyroidism of renal origin: Secondary | ICD-10-CM | POA: Diagnosis not present

## 2017-03-10 DIAGNOSIS — N186 End stage renal disease: Secondary | ICD-10-CM | POA: Diagnosis not present

## 2017-03-10 DIAGNOSIS — D631 Anemia in chronic kidney disease: Secondary | ICD-10-CM | POA: Diagnosis not present

## 2017-03-10 DIAGNOSIS — N2581 Secondary hyperparathyroidism of renal origin: Secondary | ICD-10-CM | POA: Diagnosis not present

## 2017-03-11 DIAGNOSIS — I120 Hypertensive chronic kidney disease with stage 5 chronic kidney disease or end stage renal disease: Secondary | ICD-10-CM | POA: Diagnosis not present

## 2017-03-11 DIAGNOSIS — Z992 Dependence on renal dialysis: Secondary | ICD-10-CM | POA: Diagnosis not present

## 2017-03-11 DIAGNOSIS — N186 End stage renal disease: Secondary | ICD-10-CM | POA: Diagnosis not present

## 2017-03-12 DIAGNOSIS — D509 Iron deficiency anemia, unspecified: Secondary | ICD-10-CM | POA: Diagnosis not present

## 2017-03-12 DIAGNOSIS — N186 End stage renal disease: Secondary | ICD-10-CM | POA: Diagnosis not present

## 2017-03-12 DIAGNOSIS — N2581 Secondary hyperparathyroidism of renal origin: Secondary | ICD-10-CM | POA: Diagnosis not present

## 2017-03-12 DIAGNOSIS — D631 Anemia in chronic kidney disease: Secondary | ICD-10-CM | POA: Diagnosis not present

## 2017-03-13 DIAGNOSIS — I10 Essential (primary) hypertension: Secondary | ICD-10-CM | POA: Diagnosis not present

## 2017-03-13 DIAGNOSIS — Z79899 Other long term (current) drug therapy: Secondary | ICD-10-CM | POA: Diagnosis not present

## 2017-03-13 DIAGNOSIS — Z6830 Body mass index (BMI) 30.0-30.9, adult: Secondary | ICD-10-CM | POA: Diagnosis not present

## 2017-03-13 DIAGNOSIS — N186 End stage renal disease: Secondary | ICD-10-CM | POA: Diagnosis not present

## 2017-03-13 DIAGNOSIS — E663 Overweight: Secondary | ICD-10-CM | POA: Diagnosis not present

## 2017-03-13 DIAGNOSIS — K219 Gastro-esophageal reflux disease without esophagitis: Secondary | ICD-10-CM | POA: Diagnosis not present

## 2017-03-14 DIAGNOSIS — N186 End stage renal disease: Secondary | ICD-10-CM | POA: Diagnosis not present

## 2017-03-14 DIAGNOSIS — D631 Anemia in chronic kidney disease: Secondary | ICD-10-CM | POA: Diagnosis not present

## 2017-03-14 DIAGNOSIS — N2581 Secondary hyperparathyroidism of renal origin: Secondary | ICD-10-CM | POA: Diagnosis not present

## 2017-03-14 DIAGNOSIS — D509 Iron deficiency anemia, unspecified: Secondary | ICD-10-CM | POA: Diagnosis not present

## 2017-03-17 DIAGNOSIS — D509 Iron deficiency anemia, unspecified: Secondary | ICD-10-CM | POA: Diagnosis not present

## 2017-03-17 DIAGNOSIS — N2581 Secondary hyperparathyroidism of renal origin: Secondary | ICD-10-CM | POA: Diagnosis not present

## 2017-03-17 DIAGNOSIS — N186 End stage renal disease: Secondary | ICD-10-CM | POA: Diagnosis not present

## 2017-03-17 DIAGNOSIS — D631 Anemia in chronic kidney disease: Secondary | ICD-10-CM | POA: Diagnosis not present

## 2017-03-19 DIAGNOSIS — D509 Iron deficiency anemia, unspecified: Secondary | ICD-10-CM | POA: Diagnosis not present

## 2017-03-19 DIAGNOSIS — N186 End stage renal disease: Secondary | ICD-10-CM | POA: Diagnosis not present

## 2017-03-19 DIAGNOSIS — N2581 Secondary hyperparathyroidism of renal origin: Secondary | ICD-10-CM | POA: Diagnosis not present

## 2017-03-19 DIAGNOSIS — D631 Anemia in chronic kidney disease: Secondary | ICD-10-CM | POA: Diagnosis not present

## 2017-03-21 DIAGNOSIS — D509 Iron deficiency anemia, unspecified: Secondary | ICD-10-CM | POA: Diagnosis not present

## 2017-03-21 DIAGNOSIS — N186 End stage renal disease: Secondary | ICD-10-CM | POA: Diagnosis not present

## 2017-03-21 DIAGNOSIS — N2581 Secondary hyperparathyroidism of renal origin: Secondary | ICD-10-CM | POA: Diagnosis not present

## 2017-03-21 DIAGNOSIS — D631 Anemia in chronic kidney disease: Secondary | ICD-10-CM | POA: Diagnosis not present

## 2017-03-24 DIAGNOSIS — D509 Iron deficiency anemia, unspecified: Secondary | ICD-10-CM | POA: Diagnosis not present

## 2017-03-24 DIAGNOSIS — N2581 Secondary hyperparathyroidism of renal origin: Secondary | ICD-10-CM | POA: Diagnosis not present

## 2017-03-24 DIAGNOSIS — N186 End stage renal disease: Secondary | ICD-10-CM | POA: Diagnosis not present

## 2017-03-24 DIAGNOSIS — D631 Anemia in chronic kidney disease: Secondary | ICD-10-CM | POA: Diagnosis not present

## 2017-03-26 DIAGNOSIS — D509 Iron deficiency anemia, unspecified: Secondary | ICD-10-CM | POA: Diagnosis not present

## 2017-03-26 DIAGNOSIS — D631 Anemia in chronic kidney disease: Secondary | ICD-10-CM | POA: Diagnosis not present

## 2017-03-26 DIAGNOSIS — N186 End stage renal disease: Secondary | ICD-10-CM | POA: Diagnosis not present

## 2017-03-26 DIAGNOSIS — N2581 Secondary hyperparathyroidism of renal origin: Secondary | ICD-10-CM | POA: Diagnosis not present

## 2017-03-27 ENCOUNTER — Telehealth (HOSPITAL_COMMUNITY): Payer: Self-pay | Admitting: *Deleted

## 2017-03-27 ENCOUNTER — Other Ambulatory Visit: Payer: Self-pay | Admitting: Nephrology

## 2017-03-27 DIAGNOSIS — Z7682 Awaiting organ transplant status: Secondary | ICD-10-CM

## 2017-03-27 NOTE — Telephone Encounter (Signed)
Patient given detailed instructions per Myocardial Perfusion Study Information Sheet for the test on  04/01/17. Patient notified to arrive 15 minutes early and that it is imperative to arrive on time for appointment to keep from having the test rescheduled.  If you need to cancel or reschedule your appointment, please call the office within 24 hours of your appointment. . Patient verbalized understanding. Kirstie Peri

## 2017-03-28 DIAGNOSIS — N2581 Secondary hyperparathyroidism of renal origin: Secondary | ICD-10-CM | POA: Diagnosis not present

## 2017-03-28 DIAGNOSIS — D509 Iron deficiency anemia, unspecified: Secondary | ICD-10-CM | POA: Diagnosis not present

## 2017-03-28 DIAGNOSIS — N186 End stage renal disease: Secondary | ICD-10-CM | POA: Diagnosis not present

## 2017-03-28 DIAGNOSIS — D631 Anemia in chronic kidney disease: Secondary | ICD-10-CM | POA: Diagnosis not present

## 2017-03-31 DIAGNOSIS — D631 Anemia in chronic kidney disease: Secondary | ICD-10-CM | POA: Diagnosis not present

## 2017-03-31 DIAGNOSIS — N186 End stage renal disease: Secondary | ICD-10-CM | POA: Diagnosis not present

## 2017-03-31 DIAGNOSIS — N2581 Secondary hyperparathyroidism of renal origin: Secondary | ICD-10-CM | POA: Diagnosis not present

## 2017-03-31 DIAGNOSIS — D509 Iron deficiency anemia, unspecified: Secondary | ICD-10-CM | POA: Diagnosis not present

## 2017-04-01 ENCOUNTER — Ambulatory Visit (HOSPITAL_COMMUNITY): Payer: Medicare Other | Attending: Cardiovascular Disease

## 2017-04-01 DIAGNOSIS — I2510 Atherosclerotic heart disease of native coronary artery without angina pectoris: Secondary | ICD-10-CM

## 2017-04-01 DIAGNOSIS — Z8249 Family history of ischemic heart disease and other diseases of the circulatory system: Secondary | ICD-10-CM | POA: Diagnosis not present

## 2017-04-01 DIAGNOSIS — I4891 Unspecified atrial fibrillation: Secondary | ICD-10-CM | POA: Insufficient documentation

## 2017-04-01 DIAGNOSIS — I10 Essential (primary) hypertension: Secondary | ICD-10-CM | POA: Insufficient documentation

## 2017-04-01 DIAGNOSIS — Z01810 Encounter for preprocedural cardiovascular examination: Secondary | ICD-10-CM | POA: Insufficient documentation

## 2017-04-01 DIAGNOSIS — Z7682 Awaiting organ transplant status: Secondary | ICD-10-CM

## 2017-04-01 LAB — MYOCARDIAL PERFUSION IMAGING
CHL CUP NUCLEAR SSS: 2
CSEPPHR: 110 {beats}/min
LVDIAVOL: 139 mL (ref 62–150)
LVSYSVOL: 60 mL
RATE: 0.27
Rest HR: 72 {beats}/min
SDS: 0
SRS: 2
TID: 0.94

## 2017-04-01 MED ORDER — TECHNETIUM TC 99M TETROFOSMIN IV KIT
31.3000 | PACK | Freq: Once | INTRAVENOUS | Status: AC | PRN
  Administered 2017-04-01: 31.3 via INTRAVENOUS
  Filled 2017-04-01: qty 32

## 2017-04-01 MED ORDER — TECHNETIUM TC 99M TETROFOSMIN IV KIT
10.2000 | PACK | Freq: Once | INTRAVENOUS | Status: AC | PRN
  Administered 2017-04-01: 10.2 via INTRAVENOUS
  Filled 2017-04-01: qty 11

## 2017-04-01 MED ORDER — REGADENOSON 0.4 MG/5ML IV SOLN
0.4000 mg | Freq: Once | INTRAVENOUS | Status: AC
  Administered 2017-04-01: 0.4 mg via INTRAVENOUS

## 2017-04-02 DIAGNOSIS — D631 Anemia in chronic kidney disease: Secondary | ICD-10-CM | POA: Diagnosis not present

## 2017-04-02 DIAGNOSIS — D509 Iron deficiency anemia, unspecified: Secondary | ICD-10-CM | POA: Diagnosis not present

## 2017-04-02 DIAGNOSIS — N2581 Secondary hyperparathyroidism of renal origin: Secondary | ICD-10-CM | POA: Diagnosis not present

## 2017-04-02 DIAGNOSIS — N186 End stage renal disease: Secondary | ICD-10-CM | POA: Diagnosis not present

## 2017-04-04 DIAGNOSIS — N2581 Secondary hyperparathyroidism of renal origin: Secondary | ICD-10-CM | POA: Diagnosis not present

## 2017-04-04 DIAGNOSIS — D509 Iron deficiency anemia, unspecified: Secondary | ICD-10-CM | POA: Diagnosis not present

## 2017-04-04 DIAGNOSIS — D631 Anemia in chronic kidney disease: Secondary | ICD-10-CM | POA: Diagnosis not present

## 2017-04-04 DIAGNOSIS — N186 End stage renal disease: Secondary | ICD-10-CM | POA: Diagnosis not present

## 2017-04-07 DIAGNOSIS — D631 Anemia in chronic kidney disease: Secondary | ICD-10-CM | POA: Diagnosis not present

## 2017-04-07 DIAGNOSIS — N186 End stage renal disease: Secondary | ICD-10-CM | POA: Diagnosis not present

## 2017-04-07 DIAGNOSIS — N2581 Secondary hyperparathyroidism of renal origin: Secondary | ICD-10-CM | POA: Diagnosis not present

## 2017-04-07 DIAGNOSIS — D509 Iron deficiency anemia, unspecified: Secondary | ICD-10-CM | POA: Diagnosis not present

## 2017-04-09 DIAGNOSIS — D631 Anemia in chronic kidney disease: Secondary | ICD-10-CM | POA: Diagnosis not present

## 2017-04-09 DIAGNOSIS — D509 Iron deficiency anemia, unspecified: Secondary | ICD-10-CM | POA: Diagnosis not present

## 2017-04-09 DIAGNOSIS — N186 End stage renal disease: Secondary | ICD-10-CM | POA: Diagnosis not present

## 2017-04-09 DIAGNOSIS — N2581 Secondary hyperparathyroidism of renal origin: Secondary | ICD-10-CM | POA: Diagnosis not present

## 2017-04-11 DIAGNOSIS — Z992 Dependence on renal dialysis: Secondary | ICD-10-CM | POA: Diagnosis not present

## 2017-04-11 DIAGNOSIS — I120 Hypertensive chronic kidney disease with stage 5 chronic kidney disease or end stage renal disease: Secondary | ICD-10-CM | POA: Diagnosis not present

## 2017-04-11 DIAGNOSIS — D631 Anemia in chronic kidney disease: Secondary | ICD-10-CM | POA: Diagnosis not present

## 2017-04-11 DIAGNOSIS — N186 End stage renal disease: Secondary | ICD-10-CM | POA: Diagnosis not present

## 2017-04-11 DIAGNOSIS — D509 Iron deficiency anemia, unspecified: Secondary | ICD-10-CM | POA: Diagnosis not present

## 2017-04-11 DIAGNOSIS — N2581 Secondary hyperparathyroidism of renal origin: Secondary | ICD-10-CM | POA: Diagnosis not present

## 2017-04-14 DIAGNOSIS — D509 Iron deficiency anemia, unspecified: Secondary | ICD-10-CM | POA: Diagnosis not present

## 2017-04-14 DIAGNOSIS — N186 End stage renal disease: Secondary | ICD-10-CM | POA: Diagnosis not present

## 2017-04-14 DIAGNOSIS — N2581 Secondary hyperparathyroidism of renal origin: Secondary | ICD-10-CM | POA: Diagnosis not present

## 2017-04-14 DIAGNOSIS — D631 Anemia in chronic kidney disease: Secondary | ICD-10-CM | POA: Diagnosis not present

## 2017-04-16 DIAGNOSIS — N186 End stage renal disease: Secondary | ICD-10-CM | POA: Diagnosis not present

## 2017-04-16 DIAGNOSIS — D631 Anemia in chronic kidney disease: Secondary | ICD-10-CM | POA: Diagnosis not present

## 2017-04-16 DIAGNOSIS — N2581 Secondary hyperparathyroidism of renal origin: Secondary | ICD-10-CM | POA: Diagnosis not present

## 2017-04-16 DIAGNOSIS — D509 Iron deficiency anemia, unspecified: Secondary | ICD-10-CM | POA: Diagnosis not present

## 2017-04-18 DIAGNOSIS — N186 End stage renal disease: Secondary | ICD-10-CM | POA: Diagnosis not present

## 2017-04-18 DIAGNOSIS — D509 Iron deficiency anemia, unspecified: Secondary | ICD-10-CM | POA: Diagnosis not present

## 2017-04-18 DIAGNOSIS — D631 Anemia in chronic kidney disease: Secondary | ICD-10-CM | POA: Diagnosis not present

## 2017-04-18 DIAGNOSIS — N2581 Secondary hyperparathyroidism of renal origin: Secondary | ICD-10-CM | POA: Diagnosis not present

## 2017-04-21 DIAGNOSIS — D509 Iron deficiency anemia, unspecified: Secondary | ICD-10-CM | POA: Diagnosis not present

## 2017-04-21 DIAGNOSIS — N186 End stage renal disease: Secondary | ICD-10-CM | POA: Diagnosis not present

## 2017-04-21 DIAGNOSIS — N2581 Secondary hyperparathyroidism of renal origin: Secondary | ICD-10-CM | POA: Diagnosis not present

## 2017-04-21 DIAGNOSIS — D631 Anemia in chronic kidney disease: Secondary | ICD-10-CM | POA: Diagnosis not present

## 2017-04-23 DIAGNOSIS — N186 End stage renal disease: Secondary | ICD-10-CM | POA: Diagnosis not present

## 2017-04-23 DIAGNOSIS — D631 Anemia in chronic kidney disease: Secondary | ICD-10-CM | POA: Diagnosis not present

## 2017-04-23 DIAGNOSIS — N2581 Secondary hyperparathyroidism of renal origin: Secondary | ICD-10-CM | POA: Diagnosis not present

## 2017-04-23 DIAGNOSIS — D509 Iron deficiency anemia, unspecified: Secondary | ICD-10-CM | POA: Diagnosis not present

## 2017-04-25 DIAGNOSIS — D631 Anemia in chronic kidney disease: Secondary | ICD-10-CM | POA: Diagnosis not present

## 2017-04-25 DIAGNOSIS — N2581 Secondary hyperparathyroidism of renal origin: Secondary | ICD-10-CM | POA: Diagnosis not present

## 2017-04-25 DIAGNOSIS — D509 Iron deficiency anemia, unspecified: Secondary | ICD-10-CM | POA: Diagnosis not present

## 2017-04-25 DIAGNOSIS — N186 End stage renal disease: Secondary | ICD-10-CM | POA: Diagnosis not present

## 2017-04-28 DIAGNOSIS — N2581 Secondary hyperparathyroidism of renal origin: Secondary | ICD-10-CM | POA: Diagnosis not present

## 2017-04-28 DIAGNOSIS — N186 End stage renal disease: Secondary | ICD-10-CM | POA: Diagnosis not present

## 2017-04-28 DIAGNOSIS — D631 Anemia in chronic kidney disease: Secondary | ICD-10-CM | POA: Diagnosis not present

## 2017-04-28 DIAGNOSIS — D509 Iron deficiency anemia, unspecified: Secondary | ICD-10-CM | POA: Diagnosis not present

## 2017-04-30 DIAGNOSIS — N186 End stage renal disease: Secondary | ICD-10-CM | POA: Diagnosis not present

## 2017-04-30 DIAGNOSIS — N2581 Secondary hyperparathyroidism of renal origin: Secondary | ICD-10-CM | POA: Diagnosis not present

## 2017-04-30 DIAGNOSIS — D631 Anemia in chronic kidney disease: Secondary | ICD-10-CM | POA: Diagnosis not present

## 2017-04-30 DIAGNOSIS — D509 Iron deficiency anemia, unspecified: Secondary | ICD-10-CM | POA: Diagnosis not present

## 2017-05-02 DIAGNOSIS — D509 Iron deficiency anemia, unspecified: Secondary | ICD-10-CM | POA: Diagnosis not present

## 2017-05-02 DIAGNOSIS — N186 End stage renal disease: Secondary | ICD-10-CM | POA: Diagnosis not present

## 2017-05-02 DIAGNOSIS — N2581 Secondary hyperparathyroidism of renal origin: Secondary | ICD-10-CM | POA: Diagnosis not present

## 2017-05-02 DIAGNOSIS — D631 Anemia in chronic kidney disease: Secondary | ICD-10-CM | POA: Diagnosis not present

## 2017-05-05 DIAGNOSIS — N186 End stage renal disease: Secondary | ICD-10-CM | POA: Diagnosis not present

## 2017-05-05 DIAGNOSIS — D631 Anemia in chronic kidney disease: Secondary | ICD-10-CM | POA: Diagnosis not present

## 2017-05-05 DIAGNOSIS — N2581 Secondary hyperparathyroidism of renal origin: Secondary | ICD-10-CM | POA: Diagnosis not present

## 2017-05-05 DIAGNOSIS — D509 Iron deficiency anemia, unspecified: Secondary | ICD-10-CM | POA: Diagnosis not present

## 2017-05-07 DIAGNOSIS — D631 Anemia in chronic kidney disease: Secondary | ICD-10-CM | POA: Diagnosis not present

## 2017-05-07 DIAGNOSIS — D509 Iron deficiency anemia, unspecified: Secondary | ICD-10-CM | POA: Diagnosis not present

## 2017-05-07 DIAGNOSIS — N2581 Secondary hyperparathyroidism of renal origin: Secondary | ICD-10-CM | POA: Diagnosis not present

## 2017-05-07 DIAGNOSIS — N186 End stage renal disease: Secondary | ICD-10-CM | POA: Diagnosis not present

## 2017-05-08 DIAGNOSIS — T82858A Stenosis of vascular prosthetic devices, implants and grafts, initial encounter: Secondary | ICD-10-CM | POA: Diagnosis not present

## 2017-05-08 DIAGNOSIS — N186 End stage renal disease: Secondary | ICD-10-CM | POA: Diagnosis not present

## 2017-05-08 DIAGNOSIS — Z992 Dependence on renal dialysis: Secondary | ICD-10-CM | POA: Diagnosis not present

## 2017-05-08 DIAGNOSIS — I871 Compression of vein: Secondary | ICD-10-CM | POA: Diagnosis not present

## 2017-05-09 DIAGNOSIS — D631 Anemia in chronic kidney disease: Secondary | ICD-10-CM | POA: Diagnosis not present

## 2017-05-09 DIAGNOSIS — D509 Iron deficiency anemia, unspecified: Secondary | ICD-10-CM | POA: Diagnosis not present

## 2017-05-09 DIAGNOSIS — N186 End stage renal disease: Secondary | ICD-10-CM | POA: Diagnosis not present

## 2017-05-09 DIAGNOSIS — N2581 Secondary hyperparathyroidism of renal origin: Secondary | ICD-10-CM | POA: Diagnosis not present

## 2017-05-11 DIAGNOSIS — I120 Hypertensive chronic kidney disease with stage 5 chronic kidney disease or end stage renal disease: Secondary | ICD-10-CM | POA: Diagnosis not present

## 2017-05-11 DIAGNOSIS — Z992 Dependence on renal dialysis: Secondary | ICD-10-CM | POA: Diagnosis not present

## 2017-05-11 DIAGNOSIS — N186 End stage renal disease: Secondary | ICD-10-CM | POA: Diagnosis not present

## 2017-05-12 DIAGNOSIS — D631 Anemia in chronic kidney disease: Secondary | ICD-10-CM | POA: Diagnosis not present

## 2017-05-12 DIAGNOSIS — N186 End stage renal disease: Secondary | ICD-10-CM | POA: Diagnosis not present

## 2017-05-12 DIAGNOSIS — N2581 Secondary hyperparathyroidism of renal origin: Secondary | ICD-10-CM | POA: Diagnosis not present

## 2017-05-12 DIAGNOSIS — D509 Iron deficiency anemia, unspecified: Secondary | ICD-10-CM | POA: Diagnosis not present

## 2017-05-14 DIAGNOSIS — D631 Anemia in chronic kidney disease: Secondary | ICD-10-CM | POA: Diagnosis not present

## 2017-05-14 DIAGNOSIS — D509 Iron deficiency anemia, unspecified: Secondary | ICD-10-CM | POA: Diagnosis not present

## 2017-05-14 DIAGNOSIS — N2581 Secondary hyperparathyroidism of renal origin: Secondary | ICD-10-CM | POA: Diagnosis not present

## 2017-05-14 DIAGNOSIS — N186 End stage renal disease: Secondary | ICD-10-CM | POA: Diagnosis not present

## 2017-05-16 DIAGNOSIS — N2581 Secondary hyperparathyroidism of renal origin: Secondary | ICD-10-CM | POA: Diagnosis not present

## 2017-05-16 DIAGNOSIS — N186 End stage renal disease: Secondary | ICD-10-CM | POA: Diagnosis not present

## 2017-05-16 DIAGNOSIS — D509 Iron deficiency anemia, unspecified: Secondary | ICD-10-CM | POA: Diagnosis not present

## 2017-05-16 DIAGNOSIS — D631 Anemia in chronic kidney disease: Secondary | ICD-10-CM | POA: Diagnosis not present

## 2017-05-19 DIAGNOSIS — D631 Anemia in chronic kidney disease: Secondary | ICD-10-CM | POA: Diagnosis not present

## 2017-05-19 DIAGNOSIS — N2581 Secondary hyperparathyroidism of renal origin: Secondary | ICD-10-CM | POA: Diagnosis not present

## 2017-05-19 DIAGNOSIS — N186 End stage renal disease: Secondary | ICD-10-CM | POA: Diagnosis not present

## 2017-05-19 DIAGNOSIS — D509 Iron deficiency anemia, unspecified: Secondary | ICD-10-CM | POA: Diagnosis not present

## 2017-05-21 DIAGNOSIS — D631 Anemia in chronic kidney disease: Secondary | ICD-10-CM | POA: Diagnosis not present

## 2017-05-21 DIAGNOSIS — N186 End stage renal disease: Secondary | ICD-10-CM | POA: Diagnosis not present

## 2017-05-21 DIAGNOSIS — D509 Iron deficiency anemia, unspecified: Secondary | ICD-10-CM | POA: Diagnosis not present

## 2017-05-21 DIAGNOSIS — N2581 Secondary hyperparathyroidism of renal origin: Secondary | ICD-10-CM | POA: Diagnosis not present

## 2017-05-23 DIAGNOSIS — D509 Iron deficiency anemia, unspecified: Secondary | ICD-10-CM | POA: Diagnosis not present

## 2017-05-23 DIAGNOSIS — N186 End stage renal disease: Secondary | ICD-10-CM | POA: Diagnosis not present

## 2017-05-23 DIAGNOSIS — D631 Anemia in chronic kidney disease: Secondary | ICD-10-CM | POA: Diagnosis not present

## 2017-05-23 DIAGNOSIS — N2581 Secondary hyperparathyroidism of renal origin: Secondary | ICD-10-CM | POA: Diagnosis not present

## 2017-05-25 DIAGNOSIS — Z23 Encounter for immunization: Secondary | ICD-10-CM | POA: Diagnosis not present

## 2017-05-26 DIAGNOSIS — N186 End stage renal disease: Secondary | ICD-10-CM | POA: Diagnosis not present

## 2017-05-26 DIAGNOSIS — N2581 Secondary hyperparathyroidism of renal origin: Secondary | ICD-10-CM | POA: Diagnosis not present

## 2017-05-26 DIAGNOSIS — D631 Anemia in chronic kidney disease: Secondary | ICD-10-CM | POA: Diagnosis not present

## 2017-05-26 DIAGNOSIS — D509 Iron deficiency anemia, unspecified: Secondary | ICD-10-CM | POA: Diagnosis not present

## 2017-05-28 DIAGNOSIS — D631 Anemia in chronic kidney disease: Secondary | ICD-10-CM | POA: Diagnosis not present

## 2017-05-28 DIAGNOSIS — N2581 Secondary hyperparathyroidism of renal origin: Secondary | ICD-10-CM | POA: Diagnosis not present

## 2017-05-28 DIAGNOSIS — N186 End stage renal disease: Secondary | ICD-10-CM | POA: Diagnosis not present

## 2017-05-28 DIAGNOSIS — D509 Iron deficiency anemia, unspecified: Secondary | ICD-10-CM | POA: Diagnosis not present

## 2017-05-30 DIAGNOSIS — D631 Anemia in chronic kidney disease: Secondary | ICD-10-CM | POA: Diagnosis not present

## 2017-05-30 DIAGNOSIS — D509 Iron deficiency anemia, unspecified: Secondary | ICD-10-CM | POA: Diagnosis not present

## 2017-05-30 DIAGNOSIS — N186 End stage renal disease: Secondary | ICD-10-CM | POA: Diagnosis not present

## 2017-05-30 DIAGNOSIS — N2581 Secondary hyperparathyroidism of renal origin: Secondary | ICD-10-CM | POA: Diagnosis not present

## 2017-06-02 DIAGNOSIS — N2581 Secondary hyperparathyroidism of renal origin: Secondary | ICD-10-CM | POA: Diagnosis not present

## 2017-06-02 DIAGNOSIS — D509 Iron deficiency anemia, unspecified: Secondary | ICD-10-CM | POA: Diagnosis not present

## 2017-06-02 DIAGNOSIS — N186 End stage renal disease: Secondary | ICD-10-CM | POA: Diagnosis not present

## 2017-06-02 DIAGNOSIS — D631 Anemia in chronic kidney disease: Secondary | ICD-10-CM | POA: Diagnosis not present

## 2017-06-04 DIAGNOSIS — D631 Anemia in chronic kidney disease: Secondary | ICD-10-CM | POA: Diagnosis not present

## 2017-06-04 DIAGNOSIS — N186 End stage renal disease: Secondary | ICD-10-CM | POA: Diagnosis not present

## 2017-06-04 DIAGNOSIS — N2581 Secondary hyperparathyroidism of renal origin: Secondary | ICD-10-CM | POA: Diagnosis not present

## 2017-06-04 DIAGNOSIS — D509 Iron deficiency anemia, unspecified: Secondary | ICD-10-CM | POA: Diagnosis not present

## 2017-06-06 DIAGNOSIS — D631 Anemia in chronic kidney disease: Secondary | ICD-10-CM | POA: Diagnosis not present

## 2017-06-06 DIAGNOSIS — N2581 Secondary hyperparathyroidism of renal origin: Secondary | ICD-10-CM | POA: Diagnosis not present

## 2017-06-06 DIAGNOSIS — N186 End stage renal disease: Secondary | ICD-10-CM | POA: Diagnosis not present

## 2017-06-06 DIAGNOSIS — D509 Iron deficiency anemia, unspecified: Secondary | ICD-10-CM | POA: Diagnosis not present

## 2017-06-09 DIAGNOSIS — N2581 Secondary hyperparathyroidism of renal origin: Secondary | ICD-10-CM | POA: Diagnosis not present

## 2017-06-09 DIAGNOSIS — D631 Anemia in chronic kidney disease: Secondary | ICD-10-CM | POA: Diagnosis not present

## 2017-06-09 DIAGNOSIS — N186 End stage renal disease: Secondary | ICD-10-CM | POA: Diagnosis not present

## 2017-06-09 DIAGNOSIS — D509 Iron deficiency anemia, unspecified: Secondary | ICD-10-CM | POA: Diagnosis not present

## 2017-06-11 DIAGNOSIS — N186 End stage renal disease: Secondary | ICD-10-CM | POA: Diagnosis not present

## 2017-06-11 DIAGNOSIS — D509 Iron deficiency anemia, unspecified: Secondary | ICD-10-CM | POA: Diagnosis not present

## 2017-06-11 DIAGNOSIS — N2581 Secondary hyperparathyroidism of renal origin: Secondary | ICD-10-CM | POA: Diagnosis not present

## 2017-06-11 DIAGNOSIS — Z992 Dependence on renal dialysis: Secondary | ICD-10-CM | POA: Diagnosis not present

## 2017-06-11 DIAGNOSIS — I120 Hypertensive chronic kidney disease with stage 5 chronic kidney disease or end stage renal disease: Secondary | ICD-10-CM | POA: Diagnosis not present

## 2017-06-11 DIAGNOSIS — D631 Anemia in chronic kidney disease: Secondary | ICD-10-CM | POA: Diagnosis not present

## 2017-06-13 DIAGNOSIS — D631 Anemia in chronic kidney disease: Secondary | ICD-10-CM | POA: Diagnosis not present

## 2017-06-13 DIAGNOSIS — N2581 Secondary hyperparathyroidism of renal origin: Secondary | ICD-10-CM | POA: Diagnosis not present

## 2017-06-13 DIAGNOSIS — N186 End stage renal disease: Secondary | ICD-10-CM | POA: Diagnosis not present

## 2017-06-16 DIAGNOSIS — N2581 Secondary hyperparathyroidism of renal origin: Secondary | ICD-10-CM | POA: Diagnosis not present

## 2017-06-16 DIAGNOSIS — D631 Anemia in chronic kidney disease: Secondary | ICD-10-CM | POA: Diagnosis not present

## 2017-06-16 DIAGNOSIS — N186 End stage renal disease: Secondary | ICD-10-CM | POA: Diagnosis not present

## 2017-06-18 DIAGNOSIS — N2581 Secondary hyperparathyroidism of renal origin: Secondary | ICD-10-CM | POA: Diagnosis not present

## 2017-06-18 DIAGNOSIS — N186 End stage renal disease: Secondary | ICD-10-CM | POA: Diagnosis not present

## 2017-06-18 DIAGNOSIS — D631 Anemia in chronic kidney disease: Secondary | ICD-10-CM | POA: Diagnosis not present

## 2017-06-20 DIAGNOSIS — D631 Anemia in chronic kidney disease: Secondary | ICD-10-CM | POA: Diagnosis not present

## 2017-06-20 DIAGNOSIS — N186 End stage renal disease: Secondary | ICD-10-CM | POA: Diagnosis not present

## 2017-06-20 DIAGNOSIS — N2581 Secondary hyperparathyroidism of renal origin: Secondary | ICD-10-CM | POA: Diagnosis not present

## 2017-06-23 DIAGNOSIS — N2581 Secondary hyperparathyroidism of renal origin: Secondary | ICD-10-CM | POA: Diagnosis not present

## 2017-06-23 DIAGNOSIS — N186 End stage renal disease: Secondary | ICD-10-CM | POA: Diagnosis not present

## 2017-06-23 DIAGNOSIS — D631 Anemia in chronic kidney disease: Secondary | ICD-10-CM | POA: Diagnosis not present

## 2017-06-25 DIAGNOSIS — N186 End stage renal disease: Secondary | ICD-10-CM | POA: Diagnosis not present

## 2017-06-25 DIAGNOSIS — D631 Anemia in chronic kidney disease: Secondary | ICD-10-CM | POA: Diagnosis not present

## 2017-06-25 DIAGNOSIS — N2581 Secondary hyperparathyroidism of renal origin: Secondary | ICD-10-CM | POA: Diagnosis not present

## 2017-06-27 DIAGNOSIS — N186 End stage renal disease: Secondary | ICD-10-CM | POA: Diagnosis not present

## 2017-06-27 DIAGNOSIS — N2581 Secondary hyperparathyroidism of renal origin: Secondary | ICD-10-CM | POA: Diagnosis not present

## 2017-06-27 DIAGNOSIS — D631 Anemia in chronic kidney disease: Secondary | ICD-10-CM | POA: Diagnosis not present

## 2017-06-30 DIAGNOSIS — N2581 Secondary hyperparathyroidism of renal origin: Secondary | ICD-10-CM | POA: Diagnosis not present

## 2017-06-30 DIAGNOSIS — N186 End stage renal disease: Secondary | ICD-10-CM | POA: Diagnosis not present

## 2017-06-30 DIAGNOSIS — D631 Anemia in chronic kidney disease: Secondary | ICD-10-CM | POA: Diagnosis not present

## 2017-07-02 DIAGNOSIS — N186 End stage renal disease: Secondary | ICD-10-CM | POA: Diagnosis not present

## 2017-07-02 DIAGNOSIS — N2581 Secondary hyperparathyroidism of renal origin: Secondary | ICD-10-CM | POA: Diagnosis not present

## 2017-07-02 DIAGNOSIS — D631 Anemia in chronic kidney disease: Secondary | ICD-10-CM | POA: Diagnosis not present

## 2017-07-04 DIAGNOSIS — D631 Anemia in chronic kidney disease: Secondary | ICD-10-CM | POA: Diagnosis not present

## 2017-07-04 DIAGNOSIS — N2581 Secondary hyperparathyroidism of renal origin: Secondary | ICD-10-CM | POA: Diagnosis not present

## 2017-07-04 DIAGNOSIS — N186 End stage renal disease: Secondary | ICD-10-CM | POA: Diagnosis not present

## 2017-07-07 DIAGNOSIS — D631 Anemia in chronic kidney disease: Secondary | ICD-10-CM | POA: Diagnosis not present

## 2017-07-07 DIAGNOSIS — N2581 Secondary hyperparathyroidism of renal origin: Secondary | ICD-10-CM | POA: Diagnosis not present

## 2017-07-07 DIAGNOSIS — N186 End stage renal disease: Secondary | ICD-10-CM | POA: Diagnosis not present

## 2017-07-09 DIAGNOSIS — N2581 Secondary hyperparathyroidism of renal origin: Secondary | ICD-10-CM | POA: Diagnosis not present

## 2017-07-09 DIAGNOSIS — D631 Anemia in chronic kidney disease: Secondary | ICD-10-CM | POA: Diagnosis not present

## 2017-07-09 DIAGNOSIS — N186 End stage renal disease: Secondary | ICD-10-CM | POA: Diagnosis not present

## 2017-07-11 DIAGNOSIS — Z992 Dependence on renal dialysis: Secondary | ICD-10-CM | POA: Diagnosis not present

## 2017-07-11 DIAGNOSIS — I120 Hypertensive chronic kidney disease with stage 5 chronic kidney disease or end stage renal disease: Secondary | ICD-10-CM | POA: Diagnosis not present

## 2017-07-11 DIAGNOSIS — N2581 Secondary hyperparathyroidism of renal origin: Secondary | ICD-10-CM | POA: Diagnosis not present

## 2017-07-11 DIAGNOSIS — N186 End stage renal disease: Secondary | ICD-10-CM | POA: Diagnosis not present

## 2017-07-11 DIAGNOSIS — D631 Anemia in chronic kidney disease: Secondary | ICD-10-CM | POA: Diagnosis not present

## 2017-07-14 DIAGNOSIS — N186 End stage renal disease: Secondary | ICD-10-CM | POA: Diagnosis not present

## 2017-07-14 DIAGNOSIS — D631 Anemia in chronic kidney disease: Secondary | ICD-10-CM | POA: Diagnosis not present

## 2017-07-14 DIAGNOSIS — N2581 Secondary hyperparathyroidism of renal origin: Secondary | ICD-10-CM | POA: Diagnosis not present

## 2017-07-16 DIAGNOSIS — N2581 Secondary hyperparathyroidism of renal origin: Secondary | ICD-10-CM | POA: Diagnosis not present

## 2017-07-16 DIAGNOSIS — D631 Anemia in chronic kidney disease: Secondary | ICD-10-CM | POA: Diagnosis not present

## 2017-07-16 DIAGNOSIS — N186 End stage renal disease: Secondary | ICD-10-CM | POA: Diagnosis not present

## 2017-07-18 DIAGNOSIS — N186 End stage renal disease: Secondary | ICD-10-CM | POA: Diagnosis not present

## 2017-07-18 DIAGNOSIS — N2581 Secondary hyperparathyroidism of renal origin: Secondary | ICD-10-CM | POA: Diagnosis not present

## 2017-07-18 DIAGNOSIS — D631 Anemia in chronic kidney disease: Secondary | ICD-10-CM | POA: Diagnosis not present

## 2017-07-21 DIAGNOSIS — N186 End stage renal disease: Secondary | ICD-10-CM | POA: Diagnosis not present

## 2017-07-21 DIAGNOSIS — D631 Anemia in chronic kidney disease: Secondary | ICD-10-CM | POA: Diagnosis not present

## 2017-07-21 DIAGNOSIS — N2581 Secondary hyperparathyroidism of renal origin: Secondary | ICD-10-CM | POA: Diagnosis not present

## 2017-07-23 DIAGNOSIS — N2581 Secondary hyperparathyroidism of renal origin: Secondary | ICD-10-CM | POA: Diagnosis not present

## 2017-07-23 DIAGNOSIS — D631 Anemia in chronic kidney disease: Secondary | ICD-10-CM | POA: Diagnosis not present

## 2017-07-23 DIAGNOSIS — N186 End stage renal disease: Secondary | ICD-10-CM | POA: Diagnosis not present

## 2017-07-25 DIAGNOSIS — N2581 Secondary hyperparathyroidism of renal origin: Secondary | ICD-10-CM | POA: Diagnosis not present

## 2017-07-25 DIAGNOSIS — N186 End stage renal disease: Secondary | ICD-10-CM | POA: Diagnosis not present

## 2017-07-25 DIAGNOSIS — D631 Anemia in chronic kidney disease: Secondary | ICD-10-CM | POA: Diagnosis not present

## 2017-07-28 DIAGNOSIS — N186 End stage renal disease: Secondary | ICD-10-CM | POA: Diagnosis not present

## 2017-07-28 DIAGNOSIS — D631 Anemia in chronic kidney disease: Secondary | ICD-10-CM | POA: Diagnosis not present

## 2017-07-28 DIAGNOSIS — N2581 Secondary hyperparathyroidism of renal origin: Secondary | ICD-10-CM | POA: Diagnosis not present

## 2017-07-30 DIAGNOSIS — D631 Anemia in chronic kidney disease: Secondary | ICD-10-CM | POA: Diagnosis not present

## 2017-07-30 DIAGNOSIS — N2581 Secondary hyperparathyroidism of renal origin: Secondary | ICD-10-CM | POA: Diagnosis not present

## 2017-07-30 DIAGNOSIS — N186 End stage renal disease: Secondary | ICD-10-CM | POA: Diagnosis not present

## 2017-08-01 DIAGNOSIS — N186 End stage renal disease: Secondary | ICD-10-CM | POA: Diagnosis not present

## 2017-08-01 DIAGNOSIS — N2581 Secondary hyperparathyroidism of renal origin: Secondary | ICD-10-CM | POA: Diagnosis not present

## 2017-08-01 DIAGNOSIS — D631 Anemia in chronic kidney disease: Secondary | ICD-10-CM | POA: Diagnosis not present

## 2017-08-03 DIAGNOSIS — N2581 Secondary hyperparathyroidism of renal origin: Secondary | ICD-10-CM | POA: Diagnosis not present

## 2017-08-03 DIAGNOSIS — D631 Anemia in chronic kidney disease: Secondary | ICD-10-CM | POA: Diagnosis not present

## 2017-08-03 DIAGNOSIS — N186 End stage renal disease: Secondary | ICD-10-CM | POA: Diagnosis not present

## 2017-08-06 DIAGNOSIS — D631 Anemia in chronic kidney disease: Secondary | ICD-10-CM | POA: Diagnosis not present

## 2017-08-06 DIAGNOSIS — N2581 Secondary hyperparathyroidism of renal origin: Secondary | ICD-10-CM | POA: Diagnosis not present

## 2017-08-06 DIAGNOSIS — N186 End stage renal disease: Secondary | ICD-10-CM | POA: Diagnosis not present

## 2017-08-08 DIAGNOSIS — N186 End stage renal disease: Secondary | ICD-10-CM | POA: Diagnosis not present

## 2017-08-08 DIAGNOSIS — N2581 Secondary hyperparathyroidism of renal origin: Secondary | ICD-10-CM | POA: Diagnosis not present

## 2017-08-08 DIAGNOSIS — D631 Anemia in chronic kidney disease: Secondary | ICD-10-CM | POA: Diagnosis not present

## 2017-08-10 DIAGNOSIS — N2581 Secondary hyperparathyroidism of renal origin: Secondary | ICD-10-CM | POA: Diagnosis not present

## 2017-08-10 DIAGNOSIS — N186 End stage renal disease: Secondary | ICD-10-CM | POA: Diagnosis not present

## 2017-08-10 DIAGNOSIS — D631 Anemia in chronic kidney disease: Secondary | ICD-10-CM | POA: Diagnosis not present

## 2017-08-11 DIAGNOSIS — Z992 Dependence on renal dialysis: Secondary | ICD-10-CM | POA: Diagnosis not present

## 2017-08-11 DIAGNOSIS — I120 Hypertensive chronic kidney disease with stage 5 chronic kidney disease or end stage renal disease: Secondary | ICD-10-CM | POA: Diagnosis not present

## 2017-08-11 DIAGNOSIS — N186 End stage renal disease: Secondary | ICD-10-CM | POA: Diagnosis not present

## 2017-08-13 DIAGNOSIS — D509 Iron deficiency anemia, unspecified: Secondary | ICD-10-CM | POA: Diagnosis not present

## 2017-08-13 DIAGNOSIS — D631 Anemia in chronic kidney disease: Secondary | ICD-10-CM | POA: Diagnosis not present

## 2017-08-13 DIAGNOSIS — N2581 Secondary hyperparathyroidism of renal origin: Secondary | ICD-10-CM | POA: Diagnosis not present

## 2017-08-13 DIAGNOSIS — N186 End stage renal disease: Secondary | ICD-10-CM | POA: Diagnosis not present

## 2017-08-15 DIAGNOSIS — D509 Iron deficiency anemia, unspecified: Secondary | ICD-10-CM | POA: Diagnosis not present

## 2017-08-15 DIAGNOSIS — D631 Anemia in chronic kidney disease: Secondary | ICD-10-CM | POA: Diagnosis not present

## 2017-08-15 DIAGNOSIS — N2581 Secondary hyperparathyroidism of renal origin: Secondary | ICD-10-CM | POA: Diagnosis not present

## 2017-08-15 DIAGNOSIS — N186 End stage renal disease: Secondary | ICD-10-CM | POA: Diagnosis not present

## 2017-08-18 DIAGNOSIS — D509 Iron deficiency anemia, unspecified: Secondary | ICD-10-CM | POA: Diagnosis not present

## 2017-08-18 DIAGNOSIS — D631 Anemia in chronic kidney disease: Secondary | ICD-10-CM | POA: Diagnosis not present

## 2017-08-18 DIAGNOSIS — N2581 Secondary hyperparathyroidism of renal origin: Secondary | ICD-10-CM | POA: Diagnosis not present

## 2017-08-18 DIAGNOSIS — N186 End stage renal disease: Secondary | ICD-10-CM | POA: Diagnosis not present

## 2017-08-20 DIAGNOSIS — D631 Anemia in chronic kidney disease: Secondary | ICD-10-CM | POA: Diagnosis not present

## 2017-08-20 DIAGNOSIS — N186 End stage renal disease: Secondary | ICD-10-CM | POA: Diagnosis not present

## 2017-08-20 DIAGNOSIS — N2581 Secondary hyperparathyroidism of renal origin: Secondary | ICD-10-CM | POA: Diagnosis not present

## 2017-08-20 DIAGNOSIS — D509 Iron deficiency anemia, unspecified: Secondary | ICD-10-CM | POA: Diagnosis not present

## 2017-08-22 DIAGNOSIS — N2581 Secondary hyperparathyroidism of renal origin: Secondary | ICD-10-CM | POA: Diagnosis not present

## 2017-08-22 DIAGNOSIS — D509 Iron deficiency anemia, unspecified: Secondary | ICD-10-CM | POA: Diagnosis not present

## 2017-08-22 DIAGNOSIS — D631 Anemia in chronic kidney disease: Secondary | ICD-10-CM | POA: Diagnosis not present

## 2017-08-22 DIAGNOSIS — N186 End stage renal disease: Secondary | ICD-10-CM | POA: Diagnosis not present

## 2017-08-25 DIAGNOSIS — N186 End stage renal disease: Secondary | ICD-10-CM | POA: Diagnosis not present

## 2017-08-25 DIAGNOSIS — N2581 Secondary hyperparathyroidism of renal origin: Secondary | ICD-10-CM | POA: Diagnosis not present

## 2017-08-25 DIAGNOSIS — D509 Iron deficiency anemia, unspecified: Secondary | ICD-10-CM | POA: Diagnosis not present

## 2017-08-25 DIAGNOSIS — D631 Anemia in chronic kidney disease: Secondary | ICD-10-CM | POA: Diagnosis not present

## 2017-08-27 DIAGNOSIS — D509 Iron deficiency anemia, unspecified: Secondary | ICD-10-CM | POA: Diagnosis not present

## 2017-08-27 DIAGNOSIS — N2581 Secondary hyperparathyroidism of renal origin: Secondary | ICD-10-CM | POA: Diagnosis not present

## 2017-08-27 DIAGNOSIS — D631 Anemia in chronic kidney disease: Secondary | ICD-10-CM | POA: Diagnosis not present

## 2017-08-27 DIAGNOSIS — N186 End stage renal disease: Secondary | ICD-10-CM | POA: Diagnosis not present

## 2017-08-29 DIAGNOSIS — D631 Anemia in chronic kidney disease: Secondary | ICD-10-CM | POA: Diagnosis not present

## 2017-08-29 DIAGNOSIS — D509 Iron deficiency anemia, unspecified: Secondary | ICD-10-CM | POA: Diagnosis not present

## 2017-08-29 DIAGNOSIS — N186 End stage renal disease: Secondary | ICD-10-CM | POA: Diagnosis not present

## 2017-08-29 DIAGNOSIS — N2581 Secondary hyperparathyroidism of renal origin: Secondary | ICD-10-CM | POA: Diagnosis not present

## 2017-09-01 DIAGNOSIS — D631 Anemia in chronic kidney disease: Secondary | ICD-10-CM | POA: Diagnosis not present

## 2017-09-01 DIAGNOSIS — N2581 Secondary hyperparathyroidism of renal origin: Secondary | ICD-10-CM | POA: Diagnosis not present

## 2017-09-01 DIAGNOSIS — N186 End stage renal disease: Secondary | ICD-10-CM | POA: Diagnosis not present

## 2017-09-01 DIAGNOSIS — D509 Iron deficiency anemia, unspecified: Secondary | ICD-10-CM | POA: Diagnosis not present

## 2017-09-03 DIAGNOSIS — D509 Iron deficiency anemia, unspecified: Secondary | ICD-10-CM | POA: Diagnosis not present

## 2017-09-03 DIAGNOSIS — N2581 Secondary hyperparathyroidism of renal origin: Secondary | ICD-10-CM | POA: Diagnosis not present

## 2017-09-03 DIAGNOSIS — D631 Anemia in chronic kidney disease: Secondary | ICD-10-CM | POA: Diagnosis not present

## 2017-09-03 DIAGNOSIS — N186 End stage renal disease: Secondary | ICD-10-CM | POA: Diagnosis not present

## 2017-09-05 DIAGNOSIS — D631 Anemia in chronic kidney disease: Secondary | ICD-10-CM | POA: Diagnosis not present

## 2017-09-05 DIAGNOSIS — N186 End stage renal disease: Secondary | ICD-10-CM | POA: Diagnosis not present

## 2017-09-05 DIAGNOSIS — D509 Iron deficiency anemia, unspecified: Secondary | ICD-10-CM | POA: Diagnosis not present

## 2017-09-05 DIAGNOSIS — N2581 Secondary hyperparathyroidism of renal origin: Secondary | ICD-10-CM | POA: Diagnosis not present

## 2017-09-08 DIAGNOSIS — D631 Anemia in chronic kidney disease: Secondary | ICD-10-CM | POA: Diagnosis not present

## 2017-09-08 DIAGNOSIS — D509 Iron deficiency anemia, unspecified: Secondary | ICD-10-CM | POA: Diagnosis not present

## 2017-09-08 DIAGNOSIS — N186 End stage renal disease: Secondary | ICD-10-CM | POA: Diagnosis not present

## 2017-09-08 DIAGNOSIS — N2581 Secondary hyperparathyroidism of renal origin: Secondary | ICD-10-CM | POA: Diagnosis not present

## 2017-09-10 DIAGNOSIS — D509 Iron deficiency anemia, unspecified: Secondary | ICD-10-CM | POA: Diagnosis not present

## 2017-09-10 DIAGNOSIS — D631 Anemia in chronic kidney disease: Secondary | ICD-10-CM | POA: Diagnosis not present

## 2017-09-10 DIAGNOSIS — N186 End stage renal disease: Secondary | ICD-10-CM | POA: Diagnosis not present

## 2017-09-10 DIAGNOSIS — N2581 Secondary hyperparathyroidism of renal origin: Secondary | ICD-10-CM | POA: Diagnosis not present

## 2017-09-11 DIAGNOSIS — N186 End stage renal disease: Secondary | ICD-10-CM | POA: Diagnosis not present

## 2017-09-11 DIAGNOSIS — Z992 Dependence on renal dialysis: Secondary | ICD-10-CM | POA: Diagnosis not present

## 2017-09-11 DIAGNOSIS — I120 Hypertensive chronic kidney disease with stage 5 chronic kidney disease or end stage renal disease: Secondary | ICD-10-CM | POA: Diagnosis not present

## 2017-09-12 DIAGNOSIS — Z992 Dependence on renal dialysis: Secondary | ICD-10-CM | POA: Diagnosis not present

## 2017-09-12 DIAGNOSIS — N2581 Secondary hyperparathyroidism of renal origin: Secondary | ICD-10-CM | POA: Diagnosis not present

## 2017-09-12 DIAGNOSIS — N186 End stage renal disease: Secondary | ICD-10-CM | POA: Diagnosis not present

## 2017-09-12 DIAGNOSIS — D631 Anemia in chronic kidney disease: Secondary | ICD-10-CM | POA: Diagnosis not present

## 2017-09-12 DIAGNOSIS — I120 Hypertensive chronic kidney disease with stage 5 chronic kidney disease or end stage renal disease: Secondary | ICD-10-CM | POA: Diagnosis not present

## 2017-09-12 DIAGNOSIS — D509 Iron deficiency anemia, unspecified: Secondary | ICD-10-CM | POA: Diagnosis not present

## 2017-09-15 DIAGNOSIS — N2581 Secondary hyperparathyroidism of renal origin: Secondary | ICD-10-CM | POA: Diagnosis not present

## 2017-09-15 DIAGNOSIS — D631 Anemia in chronic kidney disease: Secondary | ICD-10-CM | POA: Diagnosis not present

## 2017-09-15 DIAGNOSIS — N186 End stage renal disease: Secondary | ICD-10-CM | POA: Diagnosis not present

## 2017-09-15 DIAGNOSIS — D509 Iron deficiency anemia, unspecified: Secondary | ICD-10-CM | POA: Diagnosis not present

## 2017-09-17 DIAGNOSIS — D631 Anemia in chronic kidney disease: Secondary | ICD-10-CM | POA: Diagnosis not present

## 2017-09-17 DIAGNOSIS — D509 Iron deficiency anemia, unspecified: Secondary | ICD-10-CM | POA: Diagnosis not present

## 2017-09-17 DIAGNOSIS — N2581 Secondary hyperparathyroidism of renal origin: Secondary | ICD-10-CM | POA: Diagnosis not present

## 2017-09-17 DIAGNOSIS — N186 End stage renal disease: Secondary | ICD-10-CM | POA: Diagnosis not present

## 2017-09-19 DIAGNOSIS — N2581 Secondary hyperparathyroidism of renal origin: Secondary | ICD-10-CM | POA: Diagnosis not present

## 2017-09-19 DIAGNOSIS — D509 Iron deficiency anemia, unspecified: Secondary | ICD-10-CM | POA: Diagnosis not present

## 2017-09-19 DIAGNOSIS — N186 End stage renal disease: Secondary | ICD-10-CM | POA: Diagnosis not present

## 2017-09-19 DIAGNOSIS — D631 Anemia in chronic kidney disease: Secondary | ICD-10-CM | POA: Diagnosis not present

## 2017-09-22 DIAGNOSIS — D631 Anemia in chronic kidney disease: Secondary | ICD-10-CM | POA: Diagnosis not present

## 2017-09-22 DIAGNOSIS — N186 End stage renal disease: Secondary | ICD-10-CM | POA: Diagnosis not present

## 2017-09-22 DIAGNOSIS — N2581 Secondary hyperparathyroidism of renal origin: Secondary | ICD-10-CM | POA: Diagnosis not present

## 2017-09-22 DIAGNOSIS — D509 Iron deficiency anemia, unspecified: Secondary | ICD-10-CM | POA: Diagnosis not present

## 2017-09-24 DIAGNOSIS — N186 End stage renal disease: Secondary | ICD-10-CM | POA: Diagnosis not present

## 2017-09-24 DIAGNOSIS — D509 Iron deficiency anemia, unspecified: Secondary | ICD-10-CM | POA: Diagnosis not present

## 2017-09-24 DIAGNOSIS — N2581 Secondary hyperparathyroidism of renal origin: Secondary | ICD-10-CM | POA: Diagnosis not present

## 2017-09-24 DIAGNOSIS — D631 Anemia in chronic kidney disease: Secondary | ICD-10-CM | POA: Diagnosis not present

## 2017-09-25 DIAGNOSIS — H40053 Ocular hypertension, bilateral: Secondary | ICD-10-CM | POA: Diagnosis not present

## 2017-09-26 DIAGNOSIS — N2581 Secondary hyperparathyroidism of renal origin: Secondary | ICD-10-CM | POA: Diagnosis not present

## 2017-09-26 DIAGNOSIS — D509 Iron deficiency anemia, unspecified: Secondary | ICD-10-CM | POA: Diagnosis not present

## 2017-09-26 DIAGNOSIS — D631 Anemia in chronic kidney disease: Secondary | ICD-10-CM | POA: Diagnosis not present

## 2017-09-26 DIAGNOSIS — N186 End stage renal disease: Secondary | ICD-10-CM | POA: Diagnosis not present

## 2017-09-29 DIAGNOSIS — N2581 Secondary hyperparathyroidism of renal origin: Secondary | ICD-10-CM | POA: Diagnosis not present

## 2017-09-29 DIAGNOSIS — D509 Iron deficiency anemia, unspecified: Secondary | ICD-10-CM | POA: Diagnosis not present

## 2017-09-29 DIAGNOSIS — D631 Anemia in chronic kidney disease: Secondary | ICD-10-CM | POA: Diagnosis not present

## 2017-09-29 DIAGNOSIS — N186 End stage renal disease: Secondary | ICD-10-CM | POA: Diagnosis not present

## 2017-09-30 DIAGNOSIS — D485 Neoplasm of uncertain behavior of skin: Secondary | ICD-10-CM | POA: Diagnosis not present

## 2017-10-01 DIAGNOSIS — D631 Anemia in chronic kidney disease: Secondary | ICD-10-CM | POA: Diagnosis not present

## 2017-10-01 DIAGNOSIS — D509 Iron deficiency anemia, unspecified: Secondary | ICD-10-CM | POA: Diagnosis not present

## 2017-10-01 DIAGNOSIS — N2581 Secondary hyperparathyroidism of renal origin: Secondary | ICD-10-CM | POA: Diagnosis not present

## 2017-10-01 DIAGNOSIS — N186 End stage renal disease: Secondary | ICD-10-CM | POA: Diagnosis not present

## 2017-10-03 DIAGNOSIS — N2581 Secondary hyperparathyroidism of renal origin: Secondary | ICD-10-CM | POA: Diagnosis not present

## 2017-10-03 DIAGNOSIS — D509 Iron deficiency anemia, unspecified: Secondary | ICD-10-CM | POA: Diagnosis not present

## 2017-10-03 DIAGNOSIS — N186 End stage renal disease: Secondary | ICD-10-CM | POA: Diagnosis not present

## 2017-10-03 DIAGNOSIS — D631 Anemia in chronic kidney disease: Secondary | ICD-10-CM | POA: Diagnosis not present

## 2017-10-06 DIAGNOSIS — D631 Anemia in chronic kidney disease: Secondary | ICD-10-CM | POA: Diagnosis not present

## 2017-10-06 DIAGNOSIS — N2581 Secondary hyperparathyroidism of renal origin: Secondary | ICD-10-CM | POA: Diagnosis not present

## 2017-10-06 DIAGNOSIS — D509 Iron deficiency anemia, unspecified: Secondary | ICD-10-CM | POA: Diagnosis not present

## 2017-10-06 DIAGNOSIS — N186 End stage renal disease: Secondary | ICD-10-CM | POA: Diagnosis not present

## 2017-10-07 DIAGNOSIS — N529 Male erectile dysfunction, unspecified: Secondary | ICD-10-CM | POA: Diagnosis not present

## 2017-10-07 DIAGNOSIS — Z6831 Body mass index (BMI) 31.0-31.9, adult: Secondary | ICD-10-CM | POA: Diagnosis not present

## 2017-10-07 DIAGNOSIS — Z79899 Other long term (current) drug therapy: Secondary | ICD-10-CM | POA: Diagnosis not present

## 2017-10-07 DIAGNOSIS — K219 Gastro-esophageal reflux disease without esophagitis: Secondary | ICD-10-CM | POA: Diagnosis not present

## 2017-10-07 DIAGNOSIS — I10 Essential (primary) hypertension: Secondary | ICD-10-CM | POA: Diagnosis not present

## 2017-10-07 DIAGNOSIS — N186 End stage renal disease: Secondary | ICD-10-CM | POA: Diagnosis not present

## 2017-10-08 DIAGNOSIS — D509 Iron deficiency anemia, unspecified: Secondary | ICD-10-CM | POA: Diagnosis not present

## 2017-10-08 DIAGNOSIS — N186 End stage renal disease: Secondary | ICD-10-CM | POA: Diagnosis not present

## 2017-10-08 DIAGNOSIS — N2581 Secondary hyperparathyroidism of renal origin: Secondary | ICD-10-CM | POA: Diagnosis not present

## 2017-10-08 DIAGNOSIS — D631 Anemia in chronic kidney disease: Secondary | ICD-10-CM | POA: Diagnosis not present

## 2017-10-10 DIAGNOSIS — N186 End stage renal disease: Secondary | ICD-10-CM | POA: Diagnosis not present

## 2017-10-10 DIAGNOSIS — D509 Iron deficiency anemia, unspecified: Secondary | ICD-10-CM | POA: Diagnosis not present

## 2017-10-10 DIAGNOSIS — Z992 Dependence on renal dialysis: Secondary | ICD-10-CM | POA: Diagnosis not present

## 2017-10-10 DIAGNOSIS — D631 Anemia in chronic kidney disease: Secondary | ICD-10-CM | POA: Diagnosis not present

## 2017-10-10 DIAGNOSIS — N2581 Secondary hyperparathyroidism of renal origin: Secondary | ICD-10-CM | POA: Diagnosis not present

## 2017-10-10 DIAGNOSIS — I120 Hypertensive chronic kidney disease with stage 5 chronic kidney disease or end stage renal disease: Secondary | ICD-10-CM | POA: Diagnosis not present

## 2017-10-13 DIAGNOSIS — N2581 Secondary hyperparathyroidism of renal origin: Secondary | ICD-10-CM | POA: Diagnosis not present

## 2017-10-13 DIAGNOSIS — N186 End stage renal disease: Secondary | ICD-10-CM | POA: Diagnosis not present

## 2017-10-13 DIAGNOSIS — D631 Anemia in chronic kidney disease: Secondary | ICD-10-CM | POA: Diagnosis not present

## 2017-10-13 DIAGNOSIS — D509 Iron deficiency anemia, unspecified: Secondary | ICD-10-CM | POA: Diagnosis not present

## 2017-10-15 DIAGNOSIS — D631 Anemia in chronic kidney disease: Secondary | ICD-10-CM | POA: Diagnosis not present

## 2017-10-15 DIAGNOSIS — N2581 Secondary hyperparathyroidism of renal origin: Secondary | ICD-10-CM | POA: Diagnosis not present

## 2017-10-15 DIAGNOSIS — N186 End stage renal disease: Secondary | ICD-10-CM | POA: Diagnosis not present

## 2017-10-15 DIAGNOSIS — D509 Iron deficiency anemia, unspecified: Secondary | ICD-10-CM | POA: Diagnosis not present

## 2017-10-17 DIAGNOSIS — D631 Anemia in chronic kidney disease: Secondary | ICD-10-CM | POA: Diagnosis not present

## 2017-10-17 DIAGNOSIS — N2581 Secondary hyperparathyroidism of renal origin: Secondary | ICD-10-CM | POA: Diagnosis not present

## 2017-10-17 DIAGNOSIS — D509 Iron deficiency anemia, unspecified: Secondary | ICD-10-CM | POA: Diagnosis not present

## 2017-10-17 DIAGNOSIS — N186 End stage renal disease: Secondary | ICD-10-CM | POA: Diagnosis not present

## 2017-10-20 DIAGNOSIS — N2581 Secondary hyperparathyroidism of renal origin: Secondary | ICD-10-CM | POA: Diagnosis not present

## 2017-10-20 DIAGNOSIS — D509 Iron deficiency anemia, unspecified: Secondary | ICD-10-CM | POA: Diagnosis not present

## 2017-10-20 DIAGNOSIS — N186 End stage renal disease: Secondary | ICD-10-CM | POA: Diagnosis not present

## 2017-10-20 DIAGNOSIS — D631 Anemia in chronic kidney disease: Secondary | ICD-10-CM | POA: Diagnosis not present

## 2017-10-22 DIAGNOSIS — N2581 Secondary hyperparathyroidism of renal origin: Secondary | ICD-10-CM | POA: Diagnosis not present

## 2017-10-22 DIAGNOSIS — D509 Iron deficiency anemia, unspecified: Secondary | ICD-10-CM | POA: Diagnosis not present

## 2017-10-22 DIAGNOSIS — N186 End stage renal disease: Secondary | ICD-10-CM | POA: Diagnosis not present

## 2017-10-22 DIAGNOSIS — D631 Anemia in chronic kidney disease: Secondary | ICD-10-CM | POA: Diagnosis not present

## 2017-10-24 DIAGNOSIS — N186 End stage renal disease: Secondary | ICD-10-CM | POA: Diagnosis not present

## 2017-10-24 DIAGNOSIS — D631 Anemia in chronic kidney disease: Secondary | ICD-10-CM | POA: Diagnosis not present

## 2017-10-24 DIAGNOSIS — D509 Iron deficiency anemia, unspecified: Secondary | ICD-10-CM | POA: Diagnosis not present

## 2017-10-24 DIAGNOSIS — N2581 Secondary hyperparathyroidism of renal origin: Secondary | ICD-10-CM | POA: Diagnosis not present

## 2017-10-27 DIAGNOSIS — D631 Anemia in chronic kidney disease: Secondary | ICD-10-CM | POA: Diagnosis not present

## 2017-10-27 DIAGNOSIS — N2581 Secondary hyperparathyroidism of renal origin: Secondary | ICD-10-CM | POA: Diagnosis not present

## 2017-10-27 DIAGNOSIS — N186 End stage renal disease: Secondary | ICD-10-CM | POA: Diagnosis not present

## 2017-10-27 DIAGNOSIS — D509 Iron deficiency anemia, unspecified: Secondary | ICD-10-CM | POA: Diagnosis not present

## 2017-10-28 DIAGNOSIS — H40053 Ocular hypertension, bilateral: Secondary | ICD-10-CM | POA: Diagnosis not present

## 2017-10-29 DIAGNOSIS — D509 Iron deficiency anemia, unspecified: Secondary | ICD-10-CM | POA: Diagnosis not present

## 2017-10-29 DIAGNOSIS — N2581 Secondary hyperparathyroidism of renal origin: Secondary | ICD-10-CM | POA: Diagnosis not present

## 2017-10-29 DIAGNOSIS — D631 Anemia in chronic kidney disease: Secondary | ICD-10-CM | POA: Diagnosis not present

## 2017-10-29 DIAGNOSIS — N186 End stage renal disease: Secondary | ICD-10-CM | POA: Diagnosis not present

## 2017-10-31 DIAGNOSIS — D509 Iron deficiency anemia, unspecified: Secondary | ICD-10-CM | POA: Diagnosis not present

## 2017-10-31 DIAGNOSIS — N186 End stage renal disease: Secondary | ICD-10-CM | POA: Diagnosis not present

## 2017-10-31 DIAGNOSIS — N2581 Secondary hyperparathyroidism of renal origin: Secondary | ICD-10-CM | POA: Diagnosis not present

## 2017-10-31 DIAGNOSIS — D631 Anemia in chronic kidney disease: Secondary | ICD-10-CM | POA: Diagnosis not present

## 2017-11-03 DIAGNOSIS — N186 End stage renal disease: Secondary | ICD-10-CM | POA: Diagnosis not present

## 2017-11-03 DIAGNOSIS — N2581 Secondary hyperparathyroidism of renal origin: Secondary | ICD-10-CM | POA: Diagnosis not present

## 2017-11-03 DIAGNOSIS — D631 Anemia in chronic kidney disease: Secondary | ICD-10-CM | POA: Diagnosis not present

## 2017-11-03 DIAGNOSIS — D509 Iron deficiency anemia, unspecified: Secondary | ICD-10-CM | POA: Diagnosis not present

## 2017-11-05 DIAGNOSIS — D509 Iron deficiency anemia, unspecified: Secondary | ICD-10-CM | POA: Diagnosis not present

## 2017-11-05 DIAGNOSIS — N2581 Secondary hyperparathyroidism of renal origin: Secondary | ICD-10-CM | POA: Diagnosis not present

## 2017-11-05 DIAGNOSIS — D631 Anemia in chronic kidney disease: Secondary | ICD-10-CM | POA: Diagnosis not present

## 2017-11-05 DIAGNOSIS — N186 End stage renal disease: Secondary | ICD-10-CM | POA: Diagnosis not present

## 2017-11-07 DIAGNOSIS — N2581 Secondary hyperparathyroidism of renal origin: Secondary | ICD-10-CM | POA: Diagnosis not present

## 2017-11-07 DIAGNOSIS — N186 End stage renal disease: Secondary | ICD-10-CM | POA: Diagnosis not present

## 2017-11-07 DIAGNOSIS — D509 Iron deficiency anemia, unspecified: Secondary | ICD-10-CM | POA: Diagnosis not present

## 2017-11-07 DIAGNOSIS — D631 Anemia in chronic kidney disease: Secondary | ICD-10-CM | POA: Diagnosis not present

## 2017-11-10 DIAGNOSIS — N2581 Secondary hyperparathyroidism of renal origin: Secondary | ICD-10-CM | POA: Diagnosis not present

## 2017-11-10 DIAGNOSIS — Z992 Dependence on renal dialysis: Secondary | ICD-10-CM | POA: Diagnosis not present

## 2017-11-10 DIAGNOSIS — N186 End stage renal disease: Secondary | ICD-10-CM | POA: Diagnosis not present

## 2017-11-10 DIAGNOSIS — I120 Hypertensive chronic kidney disease with stage 5 chronic kidney disease or end stage renal disease: Secondary | ICD-10-CM | POA: Diagnosis not present

## 2017-11-10 DIAGNOSIS — D631 Anemia in chronic kidney disease: Secondary | ICD-10-CM | POA: Diagnosis not present

## 2017-11-12 DIAGNOSIS — D631 Anemia in chronic kidney disease: Secondary | ICD-10-CM | POA: Diagnosis not present

## 2017-11-12 DIAGNOSIS — N2581 Secondary hyperparathyroidism of renal origin: Secondary | ICD-10-CM | POA: Diagnosis not present

## 2017-11-12 DIAGNOSIS — N186 End stage renal disease: Secondary | ICD-10-CM | POA: Diagnosis not present

## 2017-11-14 DIAGNOSIS — D631 Anemia in chronic kidney disease: Secondary | ICD-10-CM | POA: Diagnosis not present

## 2017-11-14 DIAGNOSIS — N2581 Secondary hyperparathyroidism of renal origin: Secondary | ICD-10-CM | POA: Diagnosis not present

## 2017-11-14 DIAGNOSIS — N186 End stage renal disease: Secondary | ICD-10-CM | POA: Diagnosis not present

## 2017-11-17 DIAGNOSIS — N2581 Secondary hyperparathyroidism of renal origin: Secondary | ICD-10-CM | POA: Diagnosis not present

## 2017-11-17 DIAGNOSIS — N186 End stage renal disease: Secondary | ICD-10-CM | POA: Diagnosis not present

## 2017-11-17 DIAGNOSIS — D631 Anemia in chronic kidney disease: Secondary | ICD-10-CM | POA: Diagnosis not present

## 2017-11-19 DIAGNOSIS — D631 Anemia in chronic kidney disease: Secondary | ICD-10-CM | POA: Diagnosis not present

## 2017-11-19 DIAGNOSIS — N2581 Secondary hyperparathyroidism of renal origin: Secondary | ICD-10-CM | POA: Diagnosis not present

## 2017-11-19 DIAGNOSIS — N186 End stage renal disease: Secondary | ICD-10-CM | POA: Diagnosis not present

## 2017-11-21 DIAGNOSIS — N2581 Secondary hyperparathyroidism of renal origin: Secondary | ICD-10-CM | POA: Diagnosis not present

## 2017-11-21 DIAGNOSIS — D631 Anemia in chronic kidney disease: Secondary | ICD-10-CM | POA: Diagnosis not present

## 2017-11-21 DIAGNOSIS — N186 End stage renal disease: Secondary | ICD-10-CM | POA: Diagnosis not present

## 2017-11-24 DIAGNOSIS — N186 End stage renal disease: Secondary | ICD-10-CM | POA: Diagnosis not present

## 2017-11-24 DIAGNOSIS — D631 Anemia in chronic kidney disease: Secondary | ICD-10-CM | POA: Diagnosis not present

## 2017-11-24 DIAGNOSIS — N2581 Secondary hyperparathyroidism of renal origin: Secondary | ICD-10-CM | POA: Diagnosis not present

## 2017-11-26 DIAGNOSIS — N2581 Secondary hyperparathyroidism of renal origin: Secondary | ICD-10-CM | POA: Diagnosis not present

## 2017-11-26 DIAGNOSIS — N186 End stage renal disease: Secondary | ICD-10-CM | POA: Diagnosis not present

## 2017-11-26 DIAGNOSIS — D631 Anemia in chronic kidney disease: Secondary | ICD-10-CM | POA: Diagnosis not present

## 2017-11-28 DIAGNOSIS — N2581 Secondary hyperparathyroidism of renal origin: Secondary | ICD-10-CM | POA: Diagnosis not present

## 2017-11-28 DIAGNOSIS — D631 Anemia in chronic kidney disease: Secondary | ICD-10-CM | POA: Diagnosis not present

## 2017-11-28 DIAGNOSIS — N186 End stage renal disease: Secondary | ICD-10-CM | POA: Diagnosis not present

## 2017-12-01 DIAGNOSIS — N2581 Secondary hyperparathyroidism of renal origin: Secondary | ICD-10-CM | POA: Diagnosis not present

## 2017-12-01 DIAGNOSIS — N186 End stage renal disease: Secondary | ICD-10-CM | POA: Diagnosis not present

## 2017-12-01 DIAGNOSIS — D631 Anemia in chronic kidney disease: Secondary | ICD-10-CM | POA: Diagnosis not present

## 2017-12-03 DIAGNOSIS — N186 End stage renal disease: Secondary | ICD-10-CM | POA: Diagnosis not present

## 2017-12-03 DIAGNOSIS — N2581 Secondary hyperparathyroidism of renal origin: Secondary | ICD-10-CM | POA: Diagnosis not present

## 2017-12-03 DIAGNOSIS — D631 Anemia in chronic kidney disease: Secondary | ICD-10-CM | POA: Diagnosis not present

## 2017-12-05 DIAGNOSIS — D631 Anemia in chronic kidney disease: Secondary | ICD-10-CM | POA: Diagnosis not present

## 2017-12-05 DIAGNOSIS — N186 End stage renal disease: Secondary | ICD-10-CM | POA: Diagnosis not present

## 2017-12-05 DIAGNOSIS — N2581 Secondary hyperparathyroidism of renal origin: Secondary | ICD-10-CM | POA: Diagnosis not present

## 2017-12-08 DIAGNOSIS — N186 End stage renal disease: Secondary | ICD-10-CM | POA: Diagnosis not present

## 2017-12-08 DIAGNOSIS — D631 Anemia in chronic kidney disease: Secondary | ICD-10-CM | POA: Diagnosis not present

## 2017-12-08 DIAGNOSIS — N2581 Secondary hyperparathyroidism of renal origin: Secondary | ICD-10-CM | POA: Diagnosis not present

## 2017-12-10 DIAGNOSIS — I120 Hypertensive chronic kidney disease with stage 5 chronic kidney disease or end stage renal disease: Secondary | ICD-10-CM | POA: Diagnosis not present

## 2017-12-10 DIAGNOSIS — D631 Anemia in chronic kidney disease: Secondary | ICD-10-CM | POA: Diagnosis not present

## 2017-12-10 DIAGNOSIS — N186 End stage renal disease: Secondary | ICD-10-CM | POA: Diagnosis not present

## 2017-12-10 DIAGNOSIS — N2581 Secondary hyperparathyroidism of renal origin: Secondary | ICD-10-CM | POA: Diagnosis not present

## 2017-12-10 DIAGNOSIS — Z992 Dependence on renal dialysis: Secondary | ICD-10-CM | POA: Diagnosis not present

## 2017-12-12 DIAGNOSIS — D631 Anemia in chronic kidney disease: Secondary | ICD-10-CM | POA: Diagnosis not present

## 2017-12-12 DIAGNOSIS — N2581 Secondary hyperparathyroidism of renal origin: Secondary | ICD-10-CM | POA: Diagnosis not present

## 2017-12-12 DIAGNOSIS — N186 End stage renal disease: Secondary | ICD-10-CM | POA: Diagnosis not present

## 2017-12-15 DIAGNOSIS — N186 End stage renal disease: Secondary | ICD-10-CM | POA: Diagnosis not present

## 2017-12-15 DIAGNOSIS — N2581 Secondary hyperparathyroidism of renal origin: Secondary | ICD-10-CM | POA: Diagnosis not present

## 2017-12-15 DIAGNOSIS — D631 Anemia in chronic kidney disease: Secondary | ICD-10-CM | POA: Diagnosis not present

## 2017-12-17 DIAGNOSIS — N2581 Secondary hyperparathyroidism of renal origin: Secondary | ICD-10-CM | POA: Diagnosis not present

## 2017-12-17 DIAGNOSIS — D631 Anemia in chronic kidney disease: Secondary | ICD-10-CM | POA: Diagnosis not present

## 2017-12-17 DIAGNOSIS — N186 End stage renal disease: Secondary | ICD-10-CM | POA: Diagnosis not present

## 2017-12-19 DIAGNOSIS — N186 End stage renal disease: Secondary | ICD-10-CM | POA: Diagnosis not present

## 2017-12-19 DIAGNOSIS — N2581 Secondary hyperparathyroidism of renal origin: Secondary | ICD-10-CM | POA: Diagnosis not present

## 2017-12-19 DIAGNOSIS — D631 Anemia in chronic kidney disease: Secondary | ICD-10-CM | POA: Diagnosis not present

## 2017-12-22 DIAGNOSIS — N2581 Secondary hyperparathyroidism of renal origin: Secondary | ICD-10-CM | POA: Diagnosis not present

## 2017-12-22 DIAGNOSIS — N186 End stage renal disease: Secondary | ICD-10-CM | POA: Diagnosis not present

## 2017-12-22 DIAGNOSIS — D631 Anemia in chronic kidney disease: Secondary | ICD-10-CM | POA: Diagnosis not present

## 2017-12-24 DIAGNOSIS — N2581 Secondary hyperparathyroidism of renal origin: Secondary | ICD-10-CM | POA: Diagnosis not present

## 2017-12-24 DIAGNOSIS — N186 End stage renal disease: Secondary | ICD-10-CM | POA: Diagnosis not present

## 2017-12-24 DIAGNOSIS — D631 Anemia in chronic kidney disease: Secondary | ICD-10-CM | POA: Diagnosis not present

## 2017-12-26 DIAGNOSIS — D631 Anemia in chronic kidney disease: Secondary | ICD-10-CM | POA: Diagnosis not present

## 2017-12-26 DIAGNOSIS — N2581 Secondary hyperparathyroidism of renal origin: Secondary | ICD-10-CM | POA: Diagnosis not present

## 2017-12-26 DIAGNOSIS — N186 End stage renal disease: Secondary | ICD-10-CM | POA: Diagnosis not present

## 2017-12-29 DIAGNOSIS — D631 Anemia in chronic kidney disease: Secondary | ICD-10-CM | POA: Diagnosis not present

## 2017-12-29 DIAGNOSIS — N2581 Secondary hyperparathyroidism of renal origin: Secondary | ICD-10-CM | POA: Diagnosis not present

## 2017-12-29 DIAGNOSIS — N186 End stage renal disease: Secondary | ICD-10-CM | POA: Diagnosis not present

## 2017-12-31 DIAGNOSIS — N2581 Secondary hyperparathyroidism of renal origin: Secondary | ICD-10-CM | POA: Diagnosis not present

## 2017-12-31 DIAGNOSIS — N186 End stage renal disease: Secondary | ICD-10-CM | POA: Diagnosis not present

## 2017-12-31 DIAGNOSIS — D631 Anemia in chronic kidney disease: Secondary | ICD-10-CM | POA: Diagnosis not present

## 2018-01-02 DIAGNOSIS — N2581 Secondary hyperparathyroidism of renal origin: Secondary | ICD-10-CM | POA: Diagnosis not present

## 2018-01-02 DIAGNOSIS — N186 End stage renal disease: Secondary | ICD-10-CM | POA: Diagnosis not present

## 2018-01-02 DIAGNOSIS — D631 Anemia in chronic kidney disease: Secondary | ICD-10-CM | POA: Diagnosis not present

## 2018-01-05 DIAGNOSIS — N186 End stage renal disease: Secondary | ICD-10-CM | POA: Diagnosis not present

## 2018-01-05 DIAGNOSIS — N2581 Secondary hyperparathyroidism of renal origin: Secondary | ICD-10-CM | POA: Diagnosis not present

## 2018-01-05 DIAGNOSIS — D631 Anemia in chronic kidney disease: Secondary | ICD-10-CM | POA: Diagnosis not present

## 2018-01-07 DIAGNOSIS — N186 End stage renal disease: Secondary | ICD-10-CM | POA: Diagnosis not present

## 2018-01-07 DIAGNOSIS — D631 Anemia in chronic kidney disease: Secondary | ICD-10-CM | POA: Diagnosis not present

## 2018-01-07 DIAGNOSIS — N2581 Secondary hyperparathyroidism of renal origin: Secondary | ICD-10-CM | POA: Diagnosis not present

## 2018-01-09 DIAGNOSIS — N186 End stage renal disease: Secondary | ICD-10-CM | POA: Diagnosis not present

## 2018-01-09 DIAGNOSIS — N2581 Secondary hyperparathyroidism of renal origin: Secondary | ICD-10-CM | POA: Diagnosis not present

## 2018-01-09 DIAGNOSIS — D631 Anemia in chronic kidney disease: Secondary | ICD-10-CM | POA: Diagnosis not present

## 2018-01-10 DIAGNOSIS — N186 End stage renal disease: Secondary | ICD-10-CM | POA: Diagnosis not present

## 2018-01-10 DIAGNOSIS — I120 Hypertensive chronic kidney disease with stage 5 chronic kidney disease or end stage renal disease: Secondary | ICD-10-CM | POA: Diagnosis not present

## 2018-01-10 DIAGNOSIS — Z992 Dependence on renal dialysis: Secondary | ICD-10-CM | POA: Diagnosis not present

## 2018-01-12 DIAGNOSIS — N2581 Secondary hyperparathyroidism of renal origin: Secondary | ICD-10-CM | POA: Diagnosis not present

## 2018-01-12 DIAGNOSIS — D631 Anemia in chronic kidney disease: Secondary | ICD-10-CM | POA: Diagnosis not present

## 2018-01-12 DIAGNOSIS — N186 End stage renal disease: Secondary | ICD-10-CM | POA: Diagnosis not present

## 2018-01-14 DIAGNOSIS — D631 Anemia in chronic kidney disease: Secondary | ICD-10-CM | POA: Diagnosis not present

## 2018-01-14 DIAGNOSIS — N186 End stage renal disease: Secondary | ICD-10-CM | POA: Diagnosis not present

## 2018-01-14 DIAGNOSIS — N2581 Secondary hyperparathyroidism of renal origin: Secondary | ICD-10-CM | POA: Diagnosis not present

## 2018-01-16 DIAGNOSIS — D631 Anemia in chronic kidney disease: Secondary | ICD-10-CM | POA: Diagnosis not present

## 2018-01-16 DIAGNOSIS — N2581 Secondary hyperparathyroidism of renal origin: Secondary | ICD-10-CM | POA: Diagnosis not present

## 2018-01-16 DIAGNOSIS — N186 End stage renal disease: Secondary | ICD-10-CM | POA: Diagnosis not present

## 2018-01-19 DIAGNOSIS — N2581 Secondary hyperparathyroidism of renal origin: Secondary | ICD-10-CM | POA: Diagnosis not present

## 2018-01-19 DIAGNOSIS — N186 End stage renal disease: Secondary | ICD-10-CM | POA: Diagnosis not present

## 2018-01-19 DIAGNOSIS — D631 Anemia in chronic kidney disease: Secondary | ICD-10-CM | POA: Diagnosis not present

## 2018-01-21 DIAGNOSIS — N186 End stage renal disease: Secondary | ICD-10-CM | POA: Diagnosis not present

## 2018-01-21 DIAGNOSIS — N2581 Secondary hyperparathyroidism of renal origin: Secondary | ICD-10-CM | POA: Diagnosis not present

## 2018-01-21 DIAGNOSIS — D631 Anemia in chronic kidney disease: Secondary | ICD-10-CM | POA: Diagnosis not present

## 2018-01-23 DIAGNOSIS — N2581 Secondary hyperparathyroidism of renal origin: Secondary | ICD-10-CM | POA: Diagnosis not present

## 2018-01-23 DIAGNOSIS — D631 Anemia in chronic kidney disease: Secondary | ICD-10-CM | POA: Diagnosis not present

## 2018-01-23 DIAGNOSIS — N186 End stage renal disease: Secondary | ICD-10-CM | POA: Diagnosis not present

## 2018-01-26 DIAGNOSIS — N2581 Secondary hyperparathyroidism of renal origin: Secondary | ICD-10-CM | POA: Diagnosis not present

## 2018-01-26 DIAGNOSIS — N186 End stage renal disease: Secondary | ICD-10-CM | POA: Diagnosis not present

## 2018-01-26 DIAGNOSIS — D631 Anemia in chronic kidney disease: Secondary | ICD-10-CM | POA: Diagnosis not present

## 2018-01-28 DIAGNOSIS — D631 Anemia in chronic kidney disease: Secondary | ICD-10-CM | POA: Diagnosis not present

## 2018-01-28 DIAGNOSIS — N186 End stage renal disease: Secondary | ICD-10-CM | POA: Diagnosis not present

## 2018-01-28 DIAGNOSIS — N2581 Secondary hyperparathyroidism of renal origin: Secondary | ICD-10-CM | POA: Diagnosis not present

## 2018-01-30 DIAGNOSIS — D631 Anemia in chronic kidney disease: Secondary | ICD-10-CM | POA: Diagnosis not present

## 2018-01-30 DIAGNOSIS — N2581 Secondary hyperparathyroidism of renal origin: Secondary | ICD-10-CM | POA: Diagnosis not present

## 2018-01-30 DIAGNOSIS — N186 End stage renal disease: Secondary | ICD-10-CM | POA: Diagnosis not present

## 2018-02-02 DIAGNOSIS — D631 Anemia in chronic kidney disease: Secondary | ICD-10-CM | POA: Diagnosis not present

## 2018-02-02 DIAGNOSIS — N186 End stage renal disease: Secondary | ICD-10-CM | POA: Diagnosis not present

## 2018-02-02 DIAGNOSIS — N2581 Secondary hyperparathyroidism of renal origin: Secondary | ICD-10-CM | POA: Diagnosis not present

## 2018-02-04 DIAGNOSIS — D631 Anemia in chronic kidney disease: Secondary | ICD-10-CM | POA: Diagnosis not present

## 2018-02-04 DIAGNOSIS — N2581 Secondary hyperparathyroidism of renal origin: Secondary | ICD-10-CM | POA: Diagnosis not present

## 2018-02-04 DIAGNOSIS — N186 End stage renal disease: Secondary | ICD-10-CM | POA: Diagnosis not present

## 2018-02-06 DIAGNOSIS — N186 End stage renal disease: Secondary | ICD-10-CM | POA: Diagnosis not present

## 2018-02-06 DIAGNOSIS — N2581 Secondary hyperparathyroidism of renal origin: Secondary | ICD-10-CM | POA: Diagnosis not present

## 2018-02-06 DIAGNOSIS — D631 Anemia in chronic kidney disease: Secondary | ICD-10-CM | POA: Diagnosis not present

## 2018-02-09 DIAGNOSIS — N186 End stage renal disease: Secondary | ICD-10-CM | POA: Diagnosis not present

## 2018-02-09 DIAGNOSIS — N2581 Secondary hyperparathyroidism of renal origin: Secondary | ICD-10-CM | POA: Diagnosis not present

## 2018-02-09 DIAGNOSIS — D509 Iron deficiency anemia, unspecified: Secondary | ICD-10-CM | POA: Diagnosis not present

## 2018-02-09 DIAGNOSIS — Z992 Dependence on renal dialysis: Secondary | ICD-10-CM | POA: Diagnosis not present

## 2018-02-09 DIAGNOSIS — D631 Anemia in chronic kidney disease: Secondary | ICD-10-CM | POA: Diagnosis not present

## 2018-02-09 DIAGNOSIS — I120 Hypertensive chronic kidney disease with stage 5 chronic kidney disease or end stage renal disease: Secondary | ICD-10-CM | POA: Diagnosis not present

## 2018-02-11 DIAGNOSIS — D509 Iron deficiency anemia, unspecified: Secondary | ICD-10-CM | POA: Diagnosis not present

## 2018-02-11 DIAGNOSIS — D631 Anemia in chronic kidney disease: Secondary | ICD-10-CM | POA: Diagnosis not present

## 2018-02-11 DIAGNOSIS — N186 End stage renal disease: Secondary | ICD-10-CM | POA: Diagnosis not present

## 2018-02-11 DIAGNOSIS — N2581 Secondary hyperparathyroidism of renal origin: Secondary | ICD-10-CM | POA: Diagnosis not present

## 2018-02-13 DIAGNOSIS — N2581 Secondary hyperparathyroidism of renal origin: Secondary | ICD-10-CM | POA: Diagnosis not present

## 2018-02-13 DIAGNOSIS — D631 Anemia in chronic kidney disease: Secondary | ICD-10-CM | POA: Diagnosis not present

## 2018-02-13 DIAGNOSIS — N186 End stage renal disease: Secondary | ICD-10-CM | POA: Diagnosis not present

## 2018-02-13 DIAGNOSIS — D509 Iron deficiency anemia, unspecified: Secondary | ICD-10-CM | POA: Diagnosis not present

## 2018-02-16 DIAGNOSIS — N186 End stage renal disease: Secondary | ICD-10-CM | POA: Diagnosis not present

## 2018-02-16 DIAGNOSIS — N2581 Secondary hyperparathyroidism of renal origin: Secondary | ICD-10-CM | POA: Diagnosis not present

## 2018-02-16 DIAGNOSIS — D631 Anemia in chronic kidney disease: Secondary | ICD-10-CM | POA: Diagnosis not present

## 2018-02-16 DIAGNOSIS — D509 Iron deficiency anemia, unspecified: Secondary | ICD-10-CM | POA: Diagnosis not present

## 2018-02-17 DIAGNOSIS — I871 Compression of vein: Secondary | ICD-10-CM | POA: Diagnosis not present

## 2018-02-17 DIAGNOSIS — Z992 Dependence on renal dialysis: Secondary | ICD-10-CM | POA: Diagnosis not present

## 2018-02-17 DIAGNOSIS — N186 End stage renal disease: Secondary | ICD-10-CM | POA: Diagnosis not present

## 2018-02-18 DIAGNOSIS — D631 Anemia in chronic kidney disease: Secondary | ICD-10-CM | POA: Diagnosis not present

## 2018-02-18 DIAGNOSIS — N2581 Secondary hyperparathyroidism of renal origin: Secondary | ICD-10-CM | POA: Diagnosis not present

## 2018-02-18 DIAGNOSIS — D509 Iron deficiency anemia, unspecified: Secondary | ICD-10-CM | POA: Diagnosis not present

## 2018-02-18 DIAGNOSIS — N186 End stage renal disease: Secondary | ICD-10-CM | POA: Diagnosis not present

## 2018-02-20 DIAGNOSIS — N186 End stage renal disease: Secondary | ICD-10-CM | POA: Diagnosis not present

## 2018-02-20 DIAGNOSIS — D631 Anemia in chronic kidney disease: Secondary | ICD-10-CM | POA: Diagnosis not present

## 2018-02-20 DIAGNOSIS — N2581 Secondary hyperparathyroidism of renal origin: Secondary | ICD-10-CM | POA: Diagnosis not present

## 2018-02-20 DIAGNOSIS — D509 Iron deficiency anemia, unspecified: Secondary | ICD-10-CM | POA: Diagnosis not present

## 2018-02-23 DIAGNOSIS — N2581 Secondary hyperparathyroidism of renal origin: Secondary | ICD-10-CM | POA: Diagnosis not present

## 2018-02-23 DIAGNOSIS — D631 Anemia in chronic kidney disease: Secondary | ICD-10-CM | POA: Diagnosis not present

## 2018-02-23 DIAGNOSIS — D509 Iron deficiency anemia, unspecified: Secondary | ICD-10-CM | POA: Diagnosis not present

## 2018-02-23 DIAGNOSIS — N186 End stage renal disease: Secondary | ICD-10-CM | POA: Diagnosis not present

## 2018-02-25 DIAGNOSIS — N2581 Secondary hyperparathyroidism of renal origin: Secondary | ICD-10-CM | POA: Diagnosis not present

## 2018-02-25 DIAGNOSIS — D509 Iron deficiency anemia, unspecified: Secondary | ICD-10-CM | POA: Diagnosis not present

## 2018-02-25 DIAGNOSIS — D631 Anemia in chronic kidney disease: Secondary | ICD-10-CM | POA: Diagnosis not present

## 2018-02-25 DIAGNOSIS — N186 End stage renal disease: Secondary | ICD-10-CM | POA: Diagnosis not present

## 2018-02-27 DIAGNOSIS — D631 Anemia in chronic kidney disease: Secondary | ICD-10-CM | POA: Diagnosis not present

## 2018-02-27 DIAGNOSIS — N2581 Secondary hyperparathyroidism of renal origin: Secondary | ICD-10-CM | POA: Diagnosis not present

## 2018-02-27 DIAGNOSIS — N186 End stage renal disease: Secondary | ICD-10-CM | POA: Diagnosis not present

## 2018-02-27 DIAGNOSIS — D509 Iron deficiency anemia, unspecified: Secondary | ICD-10-CM | POA: Diagnosis not present

## 2018-03-02 DIAGNOSIS — D509 Iron deficiency anemia, unspecified: Secondary | ICD-10-CM | POA: Diagnosis not present

## 2018-03-02 DIAGNOSIS — N2581 Secondary hyperparathyroidism of renal origin: Secondary | ICD-10-CM | POA: Diagnosis not present

## 2018-03-02 DIAGNOSIS — D631 Anemia in chronic kidney disease: Secondary | ICD-10-CM | POA: Diagnosis not present

## 2018-03-02 DIAGNOSIS — N186 End stage renal disease: Secondary | ICD-10-CM | POA: Diagnosis not present

## 2018-03-03 DIAGNOSIS — H40053 Ocular hypertension, bilateral: Secondary | ICD-10-CM | POA: Diagnosis not present

## 2018-03-04 DIAGNOSIS — N2581 Secondary hyperparathyroidism of renal origin: Secondary | ICD-10-CM | POA: Diagnosis not present

## 2018-03-04 DIAGNOSIS — D509 Iron deficiency anemia, unspecified: Secondary | ICD-10-CM | POA: Diagnosis not present

## 2018-03-04 DIAGNOSIS — N186 End stage renal disease: Secondary | ICD-10-CM | POA: Diagnosis not present

## 2018-03-04 DIAGNOSIS — D631 Anemia in chronic kidney disease: Secondary | ICD-10-CM | POA: Diagnosis not present

## 2018-03-06 DIAGNOSIS — D631 Anemia in chronic kidney disease: Secondary | ICD-10-CM | POA: Diagnosis not present

## 2018-03-06 DIAGNOSIS — D509 Iron deficiency anemia, unspecified: Secondary | ICD-10-CM | POA: Diagnosis not present

## 2018-03-06 DIAGNOSIS — N186 End stage renal disease: Secondary | ICD-10-CM | POA: Diagnosis not present

## 2018-03-06 DIAGNOSIS — N2581 Secondary hyperparathyroidism of renal origin: Secondary | ICD-10-CM | POA: Diagnosis not present

## 2018-03-09 DIAGNOSIS — D509 Iron deficiency anemia, unspecified: Secondary | ICD-10-CM | POA: Diagnosis not present

## 2018-03-09 DIAGNOSIS — N2581 Secondary hyperparathyroidism of renal origin: Secondary | ICD-10-CM | POA: Diagnosis not present

## 2018-03-09 DIAGNOSIS — N186 End stage renal disease: Secondary | ICD-10-CM | POA: Diagnosis not present

## 2018-03-09 DIAGNOSIS — D631 Anemia in chronic kidney disease: Secondary | ICD-10-CM | POA: Diagnosis not present

## 2018-03-11 DIAGNOSIS — D631 Anemia in chronic kidney disease: Secondary | ICD-10-CM | POA: Diagnosis not present

## 2018-03-11 DIAGNOSIS — D509 Iron deficiency anemia, unspecified: Secondary | ICD-10-CM | POA: Diagnosis not present

## 2018-03-11 DIAGNOSIS — N2581 Secondary hyperparathyroidism of renal origin: Secondary | ICD-10-CM | POA: Diagnosis not present

## 2018-03-11 DIAGNOSIS — N186 End stage renal disease: Secondary | ICD-10-CM | POA: Diagnosis not present

## 2018-03-12 DIAGNOSIS — N2581 Secondary hyperparathyroidism of renal origin: Secondary | ICD-10-CM | POA: Diagnosis not present

## 2018-03-12 DIAGNOSIS — I120 Hypertensive chronic kidney disease with stage 5 chronic kidney disease or end stage renal disease: Secondary | ICD-10-CM | POA: Diagnosis not present

## 2018-03-12 DIAGNOSIS — D509 Iron deficiency anemia, unspecified: Secondary | ICD-10-CM | POA: Diagnosis not present

## 2018-03-12 DIAGNOSIS — N186 End stage renal disease: Secondary | ICD-10-CM | POA: Diagnosis not present

## 2018-03-12 DIAGNOSIS — D631 Anemia in chronic kidney disease: Secondary | ICD-10-CM | POA: Diagnosis not present

## 2018-03-12 DIAGNOSIS — Z992 Dependence on renal dialysis: Secondary | ICD-10-CM | POA: Diagnosis not present

## 2018-03-13 DIAGNOSIS — D509 Iron deficiency anemia, unspecified: Secondary | ICD-10-CM | POA: Diagnosis not present

## 2018-03-13 DIAGNOSIS — N2581 Secondary hyperparathyroidism of renal origin: Secondary | ICD-10-CM | POA: Diagnosis not present

## 2018-03-13 DIAGNOSIS — D631 Anemia in chronic kidney disease: Secondary | ICD-10-CM | POA: Diagnosis not present

## 2018-03-13 DIAGNOSIS — N186 End stage renal disease: Secondary | ICD-10-CM | POA: Diagnosis not present

## 2018-03-16 DIAGNOSIS — N2581 Secondary hyperparathyroidism of renal origin: Secondary | ICD-10-CM | POA: Diagnosis not present

## 2018-03-16 DIAGNOSIS — D509 Iron deficiency anemia, unspecified: Secondary | ICD-10-CM | POA: Diagnosis not present

## 2018-03-16 DIAGNOSIS — N186 End stage renal disease: Secondary | ICD-10-CM | POA: Diagnosis not present

## 2018-03-16 DIAGNOSIS — D631 Anemia in chronic kidney disease: Secondary | ICD-10-CM | POA: Diagnosis not present

## 2018-03-18 DIAGNOSIS — N186 End stage renal disease: Secondary | ICD-10-CM | POA: Diagnosis not present

## 2018-03-18 DIAGNOSIS — D631 Anemia in chronic kidney disease: Secondary | ICD-10-CM | POA: Diagnosis not present

## 2018-03-18 DIAGNOSIS — D509 Iron deficiency anemia, unspecified: Secondary | ICD-10-CM | POA: Diagnosis not present

## 2018-03-18 DIAGNOSIS — N2581 Secondary hyperparathyroidism of renal origin: Secondary | ICD-10-CM | POA: Diagnosis not present

## 2018-03-20 DIAGNOSIS — D631 Anemia in chronic kidney disease: Secondary | ICD-10-CM | POA: Diagnosis not present

## 2018-03-20 DIAGNOSIS — N186 End stage renal disease: Secondary | ICD-10-CM | POA: Diagnosis not present

## 2018-03-20 DIAGNOSIS — N2581 Secondary hyperparathyroidism of renal origin: Secondary | ICD-10-CM | POA: Diagnosis not present

## 2018-03-20 DIAGNOSIS — D509 Iron deficiency anemia, unspecified: Secondary | ICD-10-CM | POA: Diagnosis not present

## 2018-03-23 DIAGNOSIS — N2581 Secondary hyperparathyroidism of renal origin: Secondary | ICD-10-CM | POA: Diagnosis not present

## 2018-03-23 DIAGNOSIS — D509 Iron deficiency anemia, unspecified: Secondary | ICD-10-CM | POA: Diagnosis not present

## 2018-03-23 DIAGNOSIS — N186 End stage renal disease: Secondary | ICD-10-CM | POA: Diagnosis not present

## 2018-03-23 DIAGNOSIS — D631 Anemia in chronic kidney disease: Secondary | ICD-10-CM | POA: Diagnosis not present

## 2018-03-25 DIAGNOSIS — D631 Anemia in chronic kidney disease: Secondary | ICD-10-CM | POA: Diagnosis not present

## 2018-03-25 DIAGNOSIS — N2581 Secondary hyperparathyroidism of renal origin: Secondary | ICD-10-CM | POA: Diagnosis not present

## 2018-03-25 DIAGNOSIS — D509 Iron deficiency anemia, unspecified: Secondary | ICD-10-CM | POA: Diagnosis not present

## 2018-03-25 DIAGNOSIS — N186 End stage renal disease: Secondary | ICD-10-CM | POA: Diagnosis not present

## 2018-03-27 DIAGNOSIS — N2581 Secondary hyperparathyroidism of renal origin: Secondary | ICD-10-CM | POA: Diagnosis not present

## 2018-03-27 DIAGNOSIS — D631 Anemia in chronic kidney disease: Secondary | ICD-10-CM | POA: Diagnosis not present

## 2018-03-27 DIAGNOSIS — N186 End stage renal disease: Secondary | ICD-10-CM | POA: Diagnosis not present

## 2018-03-27 DIAGNOSIS — D509 Iron deficiency anemia, unspecified: Secondary | ICD-10-CM | POA: Diagnosis not present

## 2018-03-30 DIAGNOSIS — N2581 Secondary hyperparathyroidism of renal origin: Secondary | ICD-10-CM | POA: Diagnosis not present

## 2018-03-30 DIAGNOSIS — D509 Iron deficiency anemia, unspecified: Secondary | ICD-10-CM | POA: Diagnosis not present

## 2018-03-30 DIAGNOSIS — N186 End stage renal disease: Secondary | ICD-10-CM | POA: Diagnosis not present

## 2018-03-30 DIAGNOSIS — D631 Anemia in chronic kidney disease: Secondary | ICD-10-CM | POA: Diagnosis not present

## 2018-04-01 DIAGNOSIS — N2581 Secondary hyperparathyroidism of renal origin: Secondary | ICD-10-CM | POA: Diagnosis not present

## 2018-04-01 DIAGNOSIS — D509 Iron deficiency anemia, unspecified: Secondary | ICD-10-CM | POA: Diagnosis not present

## 2018-04-01 DIAGNOSIS — N186 End stage renal disease: Secondary | ICD-10-CM | POA: Diagnosis not present

## 2018-04-01 DIAGNOSIS — D631 Anemia in chronic kidney disease: Secondary | ICD-10-CM | POA: Diagnosis not present

## 2018-04-03 DIAGNOSIS — D631 Anemia in chronic kidney disease: Secondary | ICD-10-CM | POA: Diagnosis not present

## 2018-04-03 DIAGNOSIS — N186 End stage renal disease: Secondary | ICD-10-CM | POA: Diagnosis not present

## 2018-04-03 DIAGNOSIS — D509 Iron deficiency anemia, unspecified: Secondary | ICD-10-CM | POA: Diagnosis not present

## 2018-04-03 DIAGNOSIS — N2581 Secondary hyperparathyroidism of renal origin: Secondary | ICD-10-CM | POA: Diagnosis not present

## 2018-04-06 DIAGNOSIS — N2581 Secondary hyperparathyroidism of renal origin: Secondary | ICD-10-CM | POA: Diagnosis not present

## 2018-04-06 DIAGNOSIS — D631 Anemia in chronic kidney disease: Secondary | ICD-10-CM | POA: Diagnosis not present

## 2018-04-06 DIAGNOSIS — N186 End stage renal disease: Secondary | ICD-10-CM | POA: Diagnosis not present

## 2018-04-06 DIAGNOSIS — D509 Iron deficiency anemia, unspecified: Secondary | ICD-10-CM | POA: Diagnosis not present

## 2018-04-08 DIAGNOSIS — D631 Anemia in chronic kidney disease: Secondary | ICD-10-CM | POA: Diagnosis not present

## 2018-04-08 DIAGNOSIS — N186 End stage renal disease: Secondary | ICD-10-CM | POA: Diagnosis not present

## 2018-04-08 DIAGNOSIS — D509 Iron deficiency anemia, unspecified: Secondary | ICD-10-CM | POA: Diagnosis not present

## 2018-04-08 DIAGNOSIS — N2581 Secondary hyperparathyroidism of renal origin: Secondary | ICD-10-CM | POA: Diagnosis not present

## 2018-04-10 DIAGNOSIS — D631 Anemia in chronic kidney disease: Secondary | ICD-10-CM | POA: Diagnosis not present

## 2018-04-10 DIAGNOSIS — N186 End stage renal disease: Secondary | ICD-10-CM | POA: Diagnosis not present

## 2018-04-10 DIAGNOSIS — D509 Iron deficiency anemia, unspecified: Secondary | ICD-10-CM | POA: Diagnosis not present

## 2018-04-10 DIAGNOSIS — N2581 Secondary hyperparathyroidism of renal origin: Secondary | ICD-10-CM | POA: Diagnosis not present

## 2018-04-12 DIAGNOSIS — I120 Hypertensive chronic kidney disease with stage 5 chronic kidney disease or end stage renal disease: Secondary | ICD-10-CM | POA: Diagnosis not present

## 2018-04-12 DIAGNOSIS — N186 End stage renal disease: Secondary | ICD-10-CM | POA: Diagnosis not present

## 2018-04-12 DIAGNOSIS — Z992 Dependence on renal dialysis: Secondary | ICD-10-CM | POA: Diagnosis not present

## 2018-04-13 DIAGNOSIS — D631 Anemia in chronic kidney disease: Secondary | ICD-10-CM | POA: Diagnosis not present

## 2018-04-13 DIAGNOSIS — N2581 Secondary hyperparathyroidism of renal origin: Secondary | ICD-10-CM | POA: Diagnosis not present

## 2018-04-13 DIAGNOSIS — D509 Iron deficiency anemia, unspecified: Secondary | ICD-10-CM | POA: Diagnosis not present

## 2018-04-13 DIAGNOSIS — N186 End stage renal disease: Secondary | ICD-10-CM | POA: Diagnosis not present

## 2018-04-15 DIAGNOSIS — D631 Anemia in chronic kidney disease: Secondary | ICD-10-CM | POA: Diagnosis not present

## 2018-04-15 DIAGNOSIS — N2581 Secondary hyperparathyroidism of renal origin: Secondary | ICD-10-CM | POA: Diagnosis not present

## 2018-04-15 DIAGNOSIS — D509 Iron deficiency anemia, unspecified: Secondary | ICD-10-CM | POA: Diagnosis not present

## 2018-04-15 DIAGNOSIS — N186 End stage renal disease: Secondary | ICD-10-CM | POA: Diagnosis not present

## 2018-04-16 DIAGNOSIS — Z1331 Encounter for screening for depression: Secondary | ICD-10-CM | POA: Diagnosis not present

## 2018-04-16 DIAGNOSIS — N529 Male erectile dysfunction, unspecified: Secondary | ICD-10-CM | POA: Diagnosis not present

## 2018-04-16 DIAGNOSIS — Z79899 Other long term (current) drug therapy: Secondary | ICD-10-CM | POA: Diagnosis not present

## 2018-04-16 DIAGNOSIS — E039 Hypothyroidism, unspecified: Secondary | ICD-10-CM | POA: Diagnosis not present

## 2018-04-16 DIAGNOSIS — Z9181 History of falling: Secondary | ICD-10-CM | POA: Diagnosis not present

## 2018-04-16 DIAGNOSIS — N186 End stage renal disease: Secondary | ICD-10-CM | POA: Diagnosis not present

## 2018-04-16 DIAGNOSIS — K219 Gastro-esophageal reflux disease without esophagitis: Secondary | ICD-10-CM | POA: Diagnosis not present

## 2018-04-16 DIAGNOSIS — Z1339 Encounter for screening examination for other mental health and behavioral disorders: Secondary | ICD-10-CM | POA: Diagnosis not present

## 2018-04-16 DIAGNOSIS — I10 Essential (primary) hypertension: Secondary | ICD-10-CM | POA: Diagnosis not present

## 2018-04-17 DIAGNOSIS — N186 End stage renal disease: Secondary | ICD-10-CM | POA: Diagnosis not present

## 2018-04-17 DIAGNOSIS — D631 Anemia in chronic kidney disease: Secondary | ICD-10-CM | POA: Diagnosis not present

## 2018-04-17 DIAGNOSIS — N2581 Secondary hyperparathyroidism of renal origin: Secondary | ICD-10-CM | POA: Diagnosis not present

## 2018-04-17 DIAGNOSIS — D509 Iron deficiency anemia, unspecified: Secondary | ICD-10-CM | POA: Diagnosis not present

## 2018-04-20 DIAGNOSIS — N186 End stage renal disease: Secondary | ICD-10-CM | POA: Diagnosis not present

## 2018-04-20 DIAGNOSIS — N2581 Secondary hyperparathyroidism of renal origin: Secondary | ICD-10-CM | POA: Diagnosis not present

## 2018-04-20 DIAGNOSIS — D631 Anemia in chronic kidney disease: Secondary | ICD-10-CM | POA: Diagnosis not present

## 2018-04-20 DIAGNOSIS — D509 Iron deficiency anemia, unspecified: Secondary | ICD-10-CM | POA: Diagnosis not present

## 2018-04-21 DIAGNOSIS — Z1331 Encounter for screening for depression: Secondary | ICD-10-CM | POA: Diagnosis not present

## 2018-04-21 DIAGNOSIS — Z0000 Encounter for general adult medical examination without abnormal findings: Secondary | ICD-10-CM | POA: Diagnosis not present

## 2018-04-21 DIAGNOSIS — Z125 Encounter for screening for malignant neoplasm of prostate: Secondary | ICD-10-CM | POA: Diagnosis not present

## 2018-04-21 DIAGNOSIS — Z1339 Encounter for screening examination for other mental health and behavioral disorders: Secondary | ICD-10-CM | POA: Diagnosis not present

## 2018-04-22 DIAGNOSIS — D509 Iron deficiency anemia, unspecified: Secondary | ICD-10-CM | POA: Diagnosis not present

## 2018-04-22 DIAGNOSIS — N2581 Secondary hyperparathyroidism of renal origin: Secondary | ICD-10-CM | POA: Diagnosis not present

## 2018-04-22 DIAGNOSIS — N186 End stage renal disease: Secondary | ICD-10-CM | POA: Diagnosis not present

## 2018-04-22 DIAGNOSIS — D631 Anemia in chronic kidney disease: Secondary | ICD-10-CM | POA: Diagnosis not present

## 2018-04-24 DIAGNOSIS — D509 Iron deficiency anemia, unspecified: Secondary | ICD-10-CM | POA: Diagnosis not present

## 2018-04-24 DIAGNOSIS — D631 Anemia in chronic kidney disease: Secondary | ICD-10-CM | POA: Diagnosis not present

## 2018-04-24 DIAGNOSIS — N186 End stage renal disease: Secondary | ICD-10-CM | POA: Diagnosis not present

## 2018-04-24 DIAGNOSIS — N2581 Secondary hyperparathyroidism of renal origin: Secondary | ICD-10-CM | POA: Diagnosis not present

## 2018-04-27 DIAGNOSIS — N186 End stage renal disease: Secondary | ICD-10-CM | POA: Diagnosis not present

## 2018-04-27 DIAGNOSIS — D509 Iron deficiency anemia, unspecified: Secondary | ICD-10-CM | POA: Diagnosis not present

## 2018-04-27 DIAGNOSIS — N2581 Secondary hyperparathyroidism of renal origin: Secondary | ICD-10-CM | POA: Diagnosis not present

## 2018-04-27 DIAGNOSIS — D631 Anemia in chronic kidney disease: Secondary | ICD-10-CM | POA: Diagnosis not present

## 2018-04-29 DIAGNOSIS — D631 Anemia in chronic kidney disease: Secondary | ICD-10-CM | POA: Diagnosis not present

## 2018-04-29 DIAGNOSIS — N2581 Secondary hyperparathyroidism of renal origin: Secondary | ICD-10-CM | POA: Diagnosis not present

## 2018-04-29 DIAGNOSIS — D509 Iron deficiency anemia, unspecified: Secondary | ICD-10-CM | POA: Diagnosis not present

## 2018-04-29 DIAGNOSIS — N186 End stage renal disease: Secondary | ICD-10-CM | POA: Diagnosis not present

## 2018-05-01 DIAGNOSIS — N186 End stage renal disease: Secondary | ICD-10-CM | POA: Diagnosis not present

## 2018-05-01 DIAGNOSIS — D631 Anemia in chronic kidney disease: Secondary | ICD-10-CM | POA: Diagnosis not present

## 2018-05-01 DIAGNOSIS — N2581 Secondary hyperparathyroidism of renal origin: Secondary | ICD-10-CM | POA: Diagnosis not present

## 2018-05-01 DIAGNOSIS — D509 Iron deficiency anemia, unspecified: Secondary | ICD-10-CM | POA: Diagnosis not present

## 2018-05-04 DIAGNOSIS — N2581 Secondary hyperparathyroidism of renal origin: Secondary | ICD-10-CM | POA: Diagnosis not present

## 2018-05-04 DIAGNOSIS — D509 Iron deficiency anemia, unspecified: Secondary | ICD-10-CM | POA: Diagnosis not present

## 2018-05-04 DIAGNOSIS — D631 Anemia in chronic kidney disease: Secondary | ICD-10-CM | POA: Diagnosis not present

## 2018-05-04 DIAGNOSIS — N186 End stage renal disease: Secondary | ICD-10-CM | POA: Diagnosis not present

## 2018-05-06 DIAGNOSIS — D509 Iron deficiency anemia, unspecified: Secondary | ICD-10-CM | POA: Diagnosis not present

## 2018-05-06 DIAGNOSIS — N186 End stage renal disease: Secondary | ICD-10-CM | POA: Diagnosis not present

## 2018-05-06 DIAGNOSIS — N2581 Secondary hyperparathyroidism of renal origin: Secondary | ICD-10-CM | POA: Diagnosis not present

## 2018-05-06 DIAGNOSIS — D631 Anemia in chronic kidney disease: Secondary | ICD-10-CM | POA: Diagnosis not present

## 2018-05-08 DIAGNOSIS — D509 Iron deficiency anemia, unspecified: Secondary | ICD-10-CM | POA: Diagnosis not present

## 2018-05-08 DIAGNOSIS — N186 End stage renal disease: Secondary | ICD-10-CM | POA: Diagnosis not present

## 2018-05-08 DIAGNOSIS — N2581 Secondary hyperparathyroidism of renal origin: Secondary | ICD-10-CM | POA: Diagnosis not present

## 2018-05-08 DIAGNOSIS — D631 Anemia in chronic kidney disease: Secondary | ICD-10-CM | POA: Diagnosis not present

## 2018-05-11 DIAGNOSIS — D631 Anemia in chronic kidney disease: Secondary | ICD-10-CM | POA: Diagnosis not present

## 2018-05-11 DIAGNOSIS — D509 Iron deficiency anemia, unspecified: Secondary | ICD-10-CM | POA: Diagnosis not present

## 2018-05-11 DIAGNOSIS — N2581 Secondary hyperparathyroidism of renal origin: Secondary | ICD-10-CM | POA: Diagnosis not present

## 2018-05-11 DIAGNOSIS — N186 End stage renal disease: Secondary | ICD-10-CM | POA: Diagnosis not present

## 2018-05-12 DIAGNOSIS — I120 Hypertensive chronic kidney disease with stage 5 chronic kidney disease or end stage renal disease: Secondary | ICD-10-CM | POA: Diagnosis not present

## 2018-05-12 DIAGNOSIS — N186 End stage renal disease: Secondary | ICD-10-CM | POA: Diagnosis not present

## 2018-05-12 DIAGNOSIS — Z992 Dependence on renal dialysis: Secondary | ICD-10-CM | POA: Diagnosis not present

## 2018-05-13 DIAGNOSIS — N186 End stage renal disease: Secondary | ICD-10-CM | POA: Diagnosis not present

## 2018-05-13 DIAGNOSIS — D631 Anemia in chronic kidney disease: Secondary | ICD-10-CM | POA: Diagnosis not present

## 2018-05-13 DIAGNOSIS — D509 Iron deficiency anemia, unspecified: Secondary | ICD-10-CM | POA: Diagnosis not present

## 2018-05-13 DIAGNOSIS — N2581 Secondary hyperparathyroidism of renal origin: Secondary | ICD-10-CM | POA: Diagnosis not present

## 2018-05-15 DIAGNOSIS — N186 End stage renal disease: Secondary | ICD-10-CM | POA: Diagnosis not present

## 2018-05-15 DIAGNOSIS — D631 Anemia in chronic kidney disease: Secondary | ICD-10-CM | POA: Diagnosis not present

## 2018-05-15 DIAGNOSIS — N2581 Secondary hyperparathyroidism of renal origin: Secondary | ICD-10-CM | POA: Diagnosis not present

## 2018-05-15 DIAGNOSIS — D509 Iron deficiency anemia, unspecified: Secondary | ICD-10-CM | POA: Diagnosis not present

## 2018-05-18 DIAGNOSIS — N2581 Secondary hyperparathyroidism of renal origin: Secondary | ICD-10-CM | POA: Diagnosis not present

## 2018-05-18 DIAGNOSIS — N186 End stage renal disease: Secondary | ICD-10-CM | POA: Diagnosis not present

## 2018-05-18 DIAGNOSIS — D509 Iron deficiency anemia, unspecified: Secondary | ICD-10-CM | POA: Diagnosis not present

## 2018-05-18 DIAGNOSIS — D631 Anemia in chronic kidney disease: Secondary | ICD-10-CM | POA: Diagnosis not present

## 2018-05-19 DIAGNOSIS — Z23 Encounter for immunization: Secondary | ICD-10-CM | POA: Diagnosis not present

## 2018-05-20 DIAGNOSIS — D631 Anemia in chronic kidney disease: Secondary | ICD-10-CM | POA: Diagnosis not present

## 2018-05-20 DIAGNOSIS — D509 Iron deficiency anemia, unspecified: Secondary | ICD-10-CM | POA: Diagnosis not present

## 2018-05-20 DIAGNOSIS — N2581 Secondary hyperparathyroidism of renal origin: Secondary | ICD-10-CM | POA: Diagnosis not present

## 2018-05-20 DIAGNOSIS — N186 End stage renal disease: Secondary | ICD-10-CM | POA: Diagnosis not present

## 2018-05-22 DIAGNOSIS — N2581 Secondary hyperparathyroidism of renal origin: Secondary | ICD-10-CM | POA: Diagnosis not present

## 2018-05-22 DIAGNOSIS — D509 Iron deficiency anemia, unspecified: Secondary | ICD-10-CM | POA: Diagnosis not present

## 2018-05-22 DIAGNOSIS — D631 Anemia in chronic kidney disease: Secondary | ICD-10-CM | POA: Diagnosis not present

## 2018-05-22 DIAGNOSIS — N186 End stage renal disease: Secondary | ICD-10-CM | POA: Diagnosis not present

## 2018-05-25 DIAGNOSIS — N2581 Secondary hyperparathyroidism of renal origin: Secondary | ICD-10-CM | POA: Diagnosis not present

## 2018-05-25 DIAGNOSIS — D509 Iron deficiency anemia, unspecified: Secondary | ICD-10-CM | POA: Diagnosis not present

## 2018-05-25 DIAGNOSIS — N186 End stage renal disease: Secondary | ICD-10-CM | POA: Diagnosis not present

## 2018-05-25 DIAGNOSIS — D631 Anemia in chronic kidney disease: Secondary | ICD-10-CM | POA: Diagnosis not present

## 2018-05-27 DIAGNOSIS — N2581 Secondary hyperparathyroidism of renal origin: Secondary | ICD-10-CM | POA: Diagnosis not present

## 2018-05-27 DIAGNOSIS — D631 Anemia in chronic kidney disease: Secondary | ICD-10-CM | POA: Diagnosis not present

## 2018-05-27 DIAGNOSIS — N186 End stage renal disease: Secondary | ICD-10-CM | POA: Diagnosis not present

## 2018-05-27 DIAGNOSIS — D509 Iron deficiency anemia, unspecified: Secondary | ICD-10-CM | POA: Diagnosis not present

## 2018-05-29 DIAGNOSIS — D509 Iron deficiency anemia, unspecified: Secondary | ICD-10-CM | POA: Diagnosis not present

## 2018-05-29 DIAGNOSIS — N2581 Secondary hyperparathyroidism of renal origin: Secondary | ICD-10-CM | POA: Diagnosis not present

## 2018-05-29 DIAGNOSIS — N186 End stage renal disease: Secondary | ICD-10-CM | POA: Diagnosis not present

## 2018-05-29 DIAGNOSIS — D631 Anemia in chronic kidney disease: Secondary | ICD-10-CM | POA: Diagnosis not present

## 2018-06-01 DIAGNOSIS — N186 End stage renal disease: Secondary | ICD-10-CM | POA: Diagnosis not present

## 2018-06-01 DIAGNOSIS — N2581 Secondary hyperparathyroidism of renal origin: Secondary | ICD-10-CM | POA: Diagnosis not present

## 2018-06-01 DIAGNOSIS — D631 Anemia in chronic kidney disease: Secondary | ICD-10-CM | POA: Diagnosis not present

## 2018-06-01 DIAGNOSIS — D509 Iron deficiency anemia, unspecified: Secondary | ICD-10-CM | POA: Diagnosis not present

## 2018-06-03 DIAGNOSIS — N186 End stage renal disease: Secondary | ICD-10-CM | POA: Diagnosis not present

## 2018-06-03 DIAGNOSIS — D509 Iron deficiency anemia, unspecified: Secondary | ICD-10-CM | POA: Diagnosis not present

## 2018-06-03 DIAGNOSIS — N2581 Secondary hyperparathyroidism of renal origin: Secondary | ICD-10-CM | POA: Diagnosis not present

## 2018-06-03 DIAGNOSIS — D631 Anemia in chronic kidney disease: Secondary | ICD-10-CM | POA: Diagnosis not present

## 2018-06-05 DIAGNOSIS — D509 Iron deficiency anemia, unspecified: Secondary | ICD-10-CM | POA: Diagnosis not present

## 2018-06-05 DIAGNOSIS — N2581 Secondary hyperparathyroidism of renal origin: Secondary | ICD-10-CM | POA: Diagnosis not present

## 2018-06-05 DIAGNOSIS — D631 Anemia in chronic kidney disease: Secondary | ICD-10-CM | POA: Diagnosis not present

## 2018-06-05 DIAGNOSIS — N186 End stage renal disease: Secondary | ICD-10-CM | POA: Diagnosis not present

## 2018-06-08 DIAGNOSIS — D509 Iron deficiency anemia, unspecified: Secondary | ICD-10-CM | POA: Diagnosis not present

## 2018-06-08 DIAGNOSIS — N2581 Secondary hyperparathyroidism of renal origin: Secondary | ICD-10-CM | POA: Diagnosis not present

## 2018-06-08 DIAGNOSIS — D631 Anemia in chronic kidney disease: Secondary | ICD-10-CM | POA: Diagnosis not present

## 2018-06-08 DIAGNOSIS — N186 End stage renal disease: Secondary | ICD-10-CM | POA: Diagnosis not present

## 2018-06-10 DIAGNOSIS — N186 End stage renal disease: Secondary | ICD-10-CM | POA: Diagnosis not present

## 2018-06-10 DIAGNOSIS — D631 Anemia in chronic kidney disease: Secondary | ICD-10-CM | POA: Diagnosis not present

## 2018-06-10 DIAGNOSIS — N2581 Secondary hyperparathyroidism of renal origin: Secondary | ICD-10-CM | POA: Diagnosis not present

## 2018-06-10 DIAGNOSIS — D509 Iron deficiency anemia, unspecified: Secondary | ICD-10-CM | POA: Diagnosis not present

## 2018-06-12 DIAGNOSIS — D631 Anemia in chronic kidney disease: Secondary | ICD-10-CM | POA: Diagnosis not present

## 2018-06-12 DIAGNOSIS — N186 End stage renal disease: Secondary | ICD-10-CM | POA: Diagnosis not present

## 2018-06-12 DIAGNOSIS — N2581 Secondary hyperparathyroidism of renal origin: Secondary | ICD-10-CM | POA: Diagnosis not present

## 2018-06-12 DIAGNOSIS — I120 Hypertensive chronic kidney disease with stage 5 chronic kidney disease or end stage renal disease: Secondary | ICD-10-CM | POA: Diagnosis not present

## 2018-06-12 DIAGNOSIS — Z992 Dependence on renal dialysis: Secondary | ICD-10-CM | POA: Diagnosis not present

## 2018-06-15 DIAGNOSIS — N2581 Secondary hyperparathyroidism of renal origin: Secondary | ICD-10-CM | POA: Diagnosis not present

## 2018-06-15 DIAGNOSIS — N186 End stage renal disease: Secondary | ICD-10-CM | POA: Diagnosis not present

## 2018-06-15 DIAGNOSIS — D631 Anemia in chronic kidney disease: Secondary | ICD-10-CM | POA: Diagnosis not present

## 2018-06-17 DIAGNOSIS — N2581 Secondary hyperparathyroidism of renal origin: Secondary | ICD-10-CM | POA: Diagnosis not present

## 2018-06-17 DIAGNOSIS — D631 Anemia in chronic kidney disease: Secondary | ICD-10-CM | POA: Diagnosis not present

## 2018-06-17 DIAGNOSIS — N186 End stage renal disease: Secondary | ICD-10-CM | POA: Diagnosis not present

## 2018-06-19 DIAGNOSIS — N2581 Secondary hyperparathyroidism of renal origin: Secondary | ICD-10-CM | POA: Diagnosis not present

## 2018-06-19 DIAGNOSIS — N186 End stage renal disease: Secondary | ICD-10-CM | POA: Diagnosis not present

## 2018-06-19 DIAGNOSIS — D631 Anemia in chronic kidney disease: Secondary | ICD-10-CM | POA: Diagnosis not present

## 2018-06-22 DIAGNOSIS — N186 End stage renal disease: Secondary | ICD-10-CM | POA: Diagnosis not present

## 2018-06-22 DIAGNOSIS — D631 Anemia in chronic kidney disease: Secondary | ICD-10-CM | POA: Diagnosis not present

## 2018-06-22 DIAGNOSIS — N2581 Secondary hyperparathyroidism of renal origin: Secondary | ICD-10-CM | POA: Diagnosis not present

## 2018-06-24 DIAGNOSIS — D631 Anemia in chronic kidney disease: Secondary | ICD-10-CM | POA: Diagnosis not present

## 2018-06-24 DIAGNOSIS — N186 End stage renal disease: Secondary | ICD-10-CM | POA: Diagnosis not present

## 2018-06-24 DIAGNOSIS — N2581 Secondary hyperparathyroidism of renal origin: Secondary | ICD-10-CM | POA: Diagnosis not present

## 2018-06-26 DIAGNOSIS — N2581 Secondary hyperparathyroidism of renal origin: Secondary | ICD-10-CM | POA: Diagnosis not present

## 2018-06-26 DIAGNOSIS — N186 End stage renal disease: Secondary | ICD-10-CM | POA: Diagnosis not present

## 2018-06-26 DIAGNOSIS — D631 Anemia in chronic kidney disease: Secondary | ICD-10-CM | POA: Diagnosis not present

## 2018-06-29 DIAGNOSIS — N2581 Secondary hyperparathyroidism of renal origin: Secondary | ICD-10-CM | POA: Diagnosis not present

## 2018-06-29 DIAGNOSIS — D631 Anemia in chronic kidney disease: Secondary | ICD-10-CM | POA: Diagnosis not present

## 2018-06-29 DIAGNOSIS — N186 End stage renal disease: Secondary | ICD-10-CM | POA: Diagnosis not present

## 2018-07-01 DIAGNOSIS — D631 Anemia in chronic kidney disease: Secondary | ICD-10-CM | POA: Diagnosis not present

## 2018-07-01 DIAGNOSIS — N186 End stage renal disease: Secondary | ICD-10-CM | POA: Diagnosis not present

## 2018-07-01 DIAGNOSIS — N2581 Secondary hyperparathyroidism of renal origin: Secondary | ICD-10-CM | POA: Diagnosis not present

## 2018-07-03 DIAGNOSIS — N2581 Secondary hyperparathyroidism of renal origin: Secondary | ICD-10-CM | POA: Diagnosis not present

## 2018-07-03 DIAGNOSIS — D631 Anemia in chronic kidney disease: Secondary | ICD-10-CM | POA: Diagnosis not present

## 2018-07-03 DIAGNOSIS — N186 End stage renal disease: Secondary | ICD-10-CM | POA: Diagnosis not present

## 2018-07-06 DIAGNOSIS — N2581 Secondary hyperparathyroidism of renal origin: Secondary | ICD-10-CM | POA: Diagnosis not present

## 2018-07-06 DIAGNOSIS — N186 End stage renal disease: Secondary | ICD-10-CM | POA: Diagnosis not present

## 2018-07-06 DIAGNOSIS — D631 Anemia in chronic kidney disease: Secondary | ICD-10-CM | POA: Diagnosis not present

## 2018-07-08 DIAGNOSIS — N186 End stage renal disease: Secondary | ICD-10-CM | POA: Diagnosis not present

## 2018-07-08 DIAGNOSIS — D631 Anemia in chronic kidney disease: Secondary | ICD-10-CM | POA: Diagnosis not present

## 2018-07-08 DIAGNOSIS — N2581 Secondary hyperparathyroidism of renal origin: Secondary | ICD-10-CM | POA: Diagnosis not present

## 2018-07-10 DIAGNOSIS — N2581 Secondary hyperparathyroidism of renal origin: Secondary | ICD-10-CM | POA: Diagnosis not present

## 2018-07-10 DIAGNOSIS — D631 Anemia in chronic kidney disease: Secondary | ICD-10-CM | POA: Diagnosis not present

## 2018-07-10 DIAGNOSIS — N186 End stage renal disease: Secondary | ICD-10-CM | POA: Diagnosis not present

## 2018-07-12 DIAGNOSIS — N186 End stage renal disease: Secondary | ICD-10-CM | POA: Diagnosis not present

## 2018-07-12 DIAGNOSIS — I120 Hypertensive chronic kidney disease with stage 5 chronic kidney disease or end stage renal disease: Secondary | ICD-10-CM | POA: Diagnosis not present

## 2018-07-12 DIAGNOSIS — Z992 Dependence on renal dialysis: Secondary | ICD-10-CM | POA: Diagnosis not present

## 2018-07-13 DIAGNOSIS — N2581 Secondary hyperparathyroidism of renal origin: Secondary | ICD-10-CM | POA: Diagnosis not present

## 2018-07-13 DIAGNOSIS — D631 Anemia in chronic kidney disease: Secondary | ICD-10-CM | POA: Diagnosis not present

## 2018-07-13 DIAGNOSIS — D509 Iron deficiency anemia, unspecified: Secondary | ICD-10-CM | POA: Diagnosis not present

## 2018-07-13 DIAGNOSIS — N186 End stage renal disease: Secondary | ICD-10-CM | POA: Diagnosis not present

## 2018-07-15 DIAGNOSIS — D631 Anemia in chronic kidney disease: Secondary | ICD-10-CM | POA: Diagnosis not present

## 2018-07-15 DIAGNOSIS — N186 End stage renal disease: Secondary | ICD-10-CM | POA: Diagnosis not present

## 2018-07-15 DIAGNOSIS — N2581 Secondary hyperparathyroidism of renal origin: Secondary | ICD-10-CM | POA: Diagnosis not present

## 2018-07-15 DIAGNOSIS — D509 Iron deficiency anemia, unspecified: Secondary | ICD-10-CM | POA: Diagnosis not present

## 2018-07-17 DIAGNOSIS — N2581 Secondary hyperparathyroidism of renal origin: Secondary | ICD-10-CM | POA: Diagnosis not present

## 2018-07-17 DIAGNOSIS — N186 End stage renal disease: Secondary | ICD-10-CM | POA: Diagnosis not present

## 2018-07-17 DIAGNOSIS — D509 Iron deficiency anemia, unspecified: Secondary | ICD-10-CM | POA: Diagnosis not present

## 2018-07-17 DIAGNOSIS — D631 Anemia in chronic kidney disease: Secondary | ICD-10-CM | POA: Diagnosis not present

## 2018-07-20 DIAGNOSIS — N186 End stage renal disease: Secondary | ICD-10-CM | POA: Diagnosis not present

## 2018-07-20 DIAGNOSIS — D509 Iron deficiency anemia, unspecified: Secondary | ICD-10-CM | POA: Diagnosis not present

## 2018-07-20 DIAGNOSIS — D631 Anemia in chronic kidney disease: Secondary | ICD-10-CM | POA: Diagnosis not present

## 2018-07-20 DIAGNOSIS — N2581 Secondary hyperparathyroidism of renal origin: Secondary | ICD-10-CM | POA: Diagnosis not present

## 2018-07-22 DIAGNOSIS — D509 Iron deficiency anemia, unspecified: Secondary | ICD-10-CM | POA: Diagnosis not present

## 2018-07-22 DIAGNOSIS — N2581 Secondary hyperparathyroidism of renal origin: Secondary | ICD-10-CM | POA: Diagnosis not present

## 2018-07-22 DIAGNOSIS — D631 Anemia in chronic kidney disease: Secondary | ICD-10-CM | POA: Diagnosis not present

## 2018-07-22 DIAGNOSIS — N186 End stage renal disease: Secondary | ICD-10-CM | POA: Diagnosis not present

## 2018-07-24 DIAGNOSIS — N2581 Secondary hyperparathyroidism of renal origin: Secondary | ICD-10-CM | POA: Diagnosis not present

## 2018-07-24 DIAGNOSIS — D509 Iron deficiency anemia, unspecified: Secondary | ICD-10-CM | POA: Diagnosis not present

## 2018-07-24 DIAGNOSIS — D631 Anemia in chronic kidney disease: Secondary | ICD-10-CM | POA: Diagnosis not present

## 2018-07-24 DIAGNOSIS — N186 End stage renal disease: Secondary | ICD-10-CM | POA: Diagnosis not present

## 2018-07-27 DIAGNOSIS — N186 End stage renal disease: Secondary | ICD-10-CM | POA: Diagnosis not present

## 2018-07-27 DIAGNOSIS — D631 Anemia in chronic kidney disease: Secondary | ICD-10-CM | POA: Diagnosis not present

## 2018-07-27 DIAGNOSIS — D509 Iron deficiency anemia, unspecified: Secondary | ICD-10-CM | POA: Diagnosis not present

## 2018-07-27 DIAGNOSIS — N2581 Secondary hyperparathyroidism of renal origin: Secondary | ICD-10-CM | POA: Diagnosis not present

## 2018-07-29 DIAGNOSIS — N2581 Secondary hyperparathyroidism of renal origin: Secondary | ICD-10-CM | POA: Diagnosis not present

## 2018-07-29 DIAGNOSIS — D509 Iron deficiency anemia, unspecified: Secondary | ICD-10-CM | POA: Diagnosis not present

## 2018-07-29 DIAGNOSIS — D631 Anemia in chronic kidney disease: Secondary | ICD-10-CM | POA: Diagnosis not present

## 2018-07-29 DIAGNOSIS — N186 End stage renal disease: Secondary | ICD-10-CM | POA: Diagnosis not present

## 2018-07-31 DIAGNOSIS — D509 Iron deficiency anemia, unspecified: Secondary | ICD-10-CM | POA: Diagnosis not present

## 2018-07-31 DIAGNOSIS — N186 End stage renal disease: Secondary | ICD-10-CM | POA: Diagnosis not present

## 2018-07-31 DIAGNOSIS — D631 Anemia in chronic kidney disease: Secondary | ICD-10-CM | POA: Diagnosis not present

## 2018-07-31 DIAGNOSIS — N2581 Secondary hyperparathyroidism of renal origin: Secondary | ICD-10-CM | POA: Diagnosis not present

## 2018-08-02 DIAGNOSIS — D631 Anemia in chronic kidney disease: Secondary | ICD-10-CM | POA: Diagnosis not present

## 2018-08-02 DIAGNOSIS — N186 End stage renal disease: Secondary | ICD-10-CM | POA: Diagnosis not present

## 2018-08-02 DIAGNOSIS — N2581 Secondary hyperparathyroidism of renal origin: Secondary | ICD-10-CM | POA: Diagnosis not present

## 2018-08-02 DIAGNOSIS — D509 Iron deficiency anemia, unspecified: Secondary | ICD-10-CM | POA: Diagnosis not present

## 2018-08-04 DIAGNOSIS — N2581 Secondary hyperparathyroidism of renal origin: Secondary | ICD-10-CM | POA: Diagnosis not present

## 2018-08-04 DIAGNOSIS — N186 End stage renal disease: Secondary | ICD-10-CM | POA: Diagnosis not present

## 2018-08-04 DIAGNOSIS — D631 Anemia in chronic kidney disease: Secondary | ICD-10-CM | POA: Diagnosis not present

## 2018-08-04 DIAGNOSIS — D509 Iron deficiency anemia, unspecified: Secondary | ICD-10-CM | POA: Diagnosis not present

## 2018-08-07 DIAGNOSIS — N2581 Secondary hyperparathyroidism of renal origin: Secondary | ICD-10-CM | POA: Diagnosis not present

## 2018-08-07 DIAGNOSIS — N186 End stage renal disease: Secondary | ICD-10-CM | POA: Diagnosis not present

## 2018-08-07 DIAGNOSIS — D509 Iron deficiency anemia, unspecified: Secondary | ICD-10-CM | POA: Diagnosis not present

## 2018-08-07 DIAGNOSIS — D631 Anemia in chronic kidney disease: Secondary | ICD-10-CM | POA: Diagnosis not present

## 2018-08-09 DIAGNOSIS — D631 Anemia in chronic kidney disease: Secondary | ICD-10-CM | POA: Diagnosis not present

## 2018-08-09 DIAGNOSIS — D509 Iron deficiency anemia, unspecified: Secondary | ICD-10-CM | POA: Diagnosis not present

## 2018-08-09 DIAGNOSIS — N2581 Secondary hyperparathyroidism of renal origin: Secondary | ICD-10-CM | POA: Diagnosis not present

## 2018-08-09 DIAGNOSIS — N186 End stage renal disease: Secondary | ICD-10-CM | POA: Diagnosis not present

## 2018-08-11 DIAGNOSIS — N186 End stage renal disease: Secondary | ICD-10-CM | POA: Diagnosis not present

## 2018-08-11 DIAGNOSIS — D509 Iron deficiency anemia, unspecified: Secondary | ICD-10-CM | POA: Diagnosis not present

## 2018-08-11 DIAGNOSIS — N2581 Secondary hyperparathyroidism of renal origin: Secondary | ICD-10-CM | POA: Diagnosis not present

## 2018-08-11 DIAGNOSIS — D631 Anemia in chronic kidney disease: Secondary | ICD-10-CM | POA: Diagnosis not present

## 2018-08-12 DIAGNOSIS — Z992 Dependence on renal dialysis: Secondary | ICD-10-CM | POA: Diagnosis not present

## 2018-08-12 DIAGNOSIS — N186 End stage renal disease: Secondary | ICD-10-CM | POA: Diagnosis not present

## 2018-08-12 DIAGNOSIS — I120 Hypertensive chronic kidney disease with stage 5 chronic kidney disease or end stage renal disease: Secondary | ICD-10-CM | POA: Diagnosis not present

## 2018-08-14 DIAGNOSIS — N186 End stage renal disease: Secondary | ICD-10-CM | POA: Diagnosis not present

## 2018-08-14 DIAGNOSIS — D509 Iron deficiency anemia, unspecified: Secondary | ICD-10-CM | POA: Diagnosis not present

## 2018-08-14 DIAGNOSIS — D631 Anemia in chronic kidney disease: Secondary | ICD-10-CM | POA: Diagnosis not present

## 2018-08-14 DIAGNOSIS — N2581 Secondary hyperparathyroidism of renal origin: Secondary | ICD-10-CM | POA: Diagnosis not present

## 2018-08-17 DIAGNOSIS — N2581 Secondary hyperparathyroidism of renal origin: Secondary | ICD-10-CM | POA: Diagnosis not present

## 2018-08-17 DIAGNOSIS — N186 End stage renal disease: Secondary | ICD-10-CM | POA: Diagnosis not present

## 2018-08-17 DIAGNOSIS — D631 Anemia in chronic kidney disease: Secondary | ICD-10-CM | POA: Diagnosis not present

## 2018-08-17 DIAGNOSIS — D509 Iron deficiency anemia, unspecified: Secondary | ICD-10-CM | POA: Diagnosis not present

## 2018-08-18 ENCOUNTER — Encounter: Payer: Self-pay | Admitting: Cardiology

## 2018-08-18 ENCOUNTER — Ambulatory Visit (INDEPENDENT_AMBULATORY_CARE_PROVIDER_SITE_OTHER): Payer: Medicare Other | Admitting: Cardiology

## 2018-08-18 VITALS — BP 150/80 | HR 93 | Ht 69.0 in | Wt 195.8 lb

## 2018-08-18 DIAGNOSIS — Z01810 Encounter for preprocedural cardiovascular examination: Secondary | ICD-10-CM | POA: Diagnosis not present

## 2018-08-18 DIAGNOSIS — I10 Essential (primary) hypertension: Secondary | ICD-10-CM | POA: Diagnosis not present

## 2018-08-18 DIAGNOSIS — N186 End stage renal disease: Secondary | ICD-10-CM | POA: Diagnosis not present

## 2018-08-18 DIAGNOSIS — I4891 Unspecified atrial fibrillation: Secondary | ICD-10-CM | POA: Diagnosis not present

## 2018-08-18 DIAGNOSIS — Z992 Dependence on renal dialysis: Secondary | ICD-10-CM

## 2018-08-18 DIAGNOSIS — Z452 Encounter for adjustment and management of vascular access device: Secondary | ICD-10-CM | POA: Insufficient documentation

## 2018-08-18 NOTE — Patient Instructions (Signed)
Medication Instructions:   Your physician recommends that you continue on your current medications as directed. Please refer to the Current Medication list given to you today.  If you need a refill on your cardiac medications before your next appointment, please call your pharmacy.   Lab work:  NONE  If you have labs (blood work) drawn today and your tests are completely normal, you will receive your results only by: Marland Kitchen MyChart Message (if you have MyChart) OR . A paper copy in the mail If you have any lab test that is abnormal or we need to change your treatment, we will call you to review the results.  Testing/Procedures:  Your physician has requested that you have a lexiscan myoview. For further information please visit [email or web address]. Please follow instruction sheet, as given.  Your physician has requested that you have an echocardiogram. Echocardiography is a painless test that uses sound waves to create images of your heart. It provides your doctor with information about the size and shape of your heart and how well your heart's chambers and valves are working. This procedure takes approximately one hour. There are no restrictions for this procedure.    Follow-Up: At [facility], you and your health needs are our priority.  As part of our continuing mission to provide you with exceptional heart care, we have created designated Provider Care Teams.  These Care Teams include your primary Cardiologist (physician) and Advanced Practice Providers (APPs -  Physician Assistants and Nurse Practitioners) who all work together to provide you with the care you need, when you need it. You will need a follow up appointment in 3 months.   Dr. Agustin Cree or another member of our [place] Provider Team in [place]: Shirlee More, MD . Jyl Heinz, MD

## 2018-08-18 NOTE — Progress Notes (Signed)
Cardiology Consultation:    Date:  08/18/2018   ID:  Thomas Adams, DOB Jul 13, 1961, MRN [id number]  PCP:  Helen Hashimoto., MD  Cardiologist:  Jenne Campus, MD   Referring MD: Helen Hashimoto., MD   Chief Complaint  Patient presents with  . Pre-op Exam  I need to have a kidney transplant  History of Present Illness:    Thomas Adams is a 58 y.o. male who is being seen today for the evaluation of cardiac issue before kidney transplant at the request of Helen Hashimoto., MD.  He is a young gentleman who is being on dialysis for last 3 years he is being evaluated by [facility] for potential kidney transplant.  He was referred to [place] for cardiac assessment before kidney transplant.  Overall he is doing well he complained of having weakness and fatigue but no chest pain tightness squeezing pressure burning chest no extraordinary shortness of breath no swelling of lower extremities.  In spite of him being on hemodialysis he still urinate.  I reviewed his records and in 2018 summertime he did have a stress test which was negative.  He also had echocardiogram done in 2017 which showed some degree of aortic stenosis but overall normal left ventricular ejection fraction  Past Medical History:  Diagnosis Date  . CKD (chronic kidney disease), stage III ([place])   . Complication of anesthesia    " i HAD A HARD TIME WAKING WHEN THEY PUT MY FISTULA IN "  . Hypertension   . Renal insufficiency    dialysis 08/2013, tuesday/thursday/saturday    Past Surgical History:  Procedure Laterality Date  . AV FISTULA PLACEMENT Left 09/22/2013   Procedure: ARTERIOVENOUS (AV) FISTULA CREATION RADIOCEPHALIC;  Surgeon: Elam Dutch, MD;  Location: [place];  Service: Vascular;  Laterality: Left;  . DIALYSIS FISTULA CREATION    . INSERTION OF DIALYSIS CATHETER Right 09/22/2013   Procedure: INSERTION OF DIALYSIS CATHETER; ULTRASOUND GUIDED;  Surgeon: Elam Dutch, MD;  Location: [place];  Service: Vascular;   Laterality: Right;  . IR GENERIC HISTORICAL  12/14/2015   IR RADIOLOGIST EVAL & MGMT 12/14/2015 Aletta Edouard, MD [facility] INTERV RAD  . REMOVAL OF A DIALYSIS CATHETER Right 09/22/2013   Procedure: REMOVAL OF A DIALYSIS CATHETER OF TEMPORARY RIGHT INTERNAL JUGULAR ;  Surgeon: Elam Dutch, MD;  Location: [facility] OR;  Service: Vascular;  Laterality: Right;    Current Medications: Current Meds  Medication Sig  . amLODipine (NORVASC) 10 MG tablet Take 1 tablet (10 mg total) by mouth daily.  . hydrALAZINE (APRESOLINE) 25 MG tablet Take 1 tablet (25 mg total) by mouth every 8 (eight) hours.  Marland Kitchen labetalol (NORMODYNE) 200 MG tablet Take 1 tablet (200 mg total) by mouth 2 (two) times daily.  Marland Kitchen lisinopril (PRINIVIL,ZESTRIL) 20 MG tablet Take 1 tablet by mouth daily.  . sevelamer carbonate (RENVELA) 800 MG tablet Take 2,400 mg by mouth 3 (three) times daily.     Allergies:   Penicillins   Social History   Socioeconomic History  . Marital status: Divorced    Spouse name: Not on file  . Number of children: Not on file  . Years of education: Not on file  . Highest education level: Not on file  Occupational History  . Not on file  Social Needs  . Financial resource strain: Not on file  . Food insecurity:    Worry: Not on file    Inability: Not on file  . Transportation needs:  Medical: Not on file    Non-medical: Not on file  Tobacco Use  . Smoking status: Never Smoker  . Smokeless tobacco: Never Used  Substance and Sexual Activity  . Alcohol use: No  . Drug use: No  . Sexual activity: Not on file  Lifestyle  . Physical activity:    Days per week: Not on file    Minutes per session: Not on file  . Stress: Not on file  Relationships  . Social connections:    Talks on phone: Not on file    Gets together: Not on file    Attends religious service: Not on file    Active member of club or organization: Not on file    Attends meetings of clubs or organizations: Not on file     Relationship status: Not on file  Other Topics Concern  . Not on file  Social History Narrative  . Not on file     Family History: The patient's family history includes Hypertension in his father. ROS:   Please see the history of present illness.    All 14 point review of systems negative except as described per history of present illness.  EKGs/Labs/Other Studies Reviewed:    The following studies were reviewed today: Stress test in 2018 reviewed which was negative, echocardiogram from 2017 reviewed as well which showed normal left ventricular ejection fraction mild AS  EKG:  EKG is  ordered today.  The ekg ordered today demonstrates normal sinus rhythm normal P interval left ventricular hypertrophy with repolarization abnormalities  Recent Labs: No results found for requested labs within last 8760 hours.  Recent Lipid Panel No results found for: CHOL, TRIG, HDL, CHOLHDL, VLDL, LDLCALC, LDLDIRECT  Physical Exam:    VS:  BP (!) 150/80   Pulse 93   Ht 5\' 9"  (1.753 m)   Wt 195 lb 12.8 oz (88.8 kg)   SpO2 97%   BMI 28.91 kg/m     Wt Readings from Last 3 Encounters:  08/18/18 195 lb 12.8 oz (88.8 kg)  03/05/16 205 lb 12.8 oz (93.4 kg)  12/14/15 206 lb 11.2 oz (93.8 kg)     GEN:  Well nourished, well developed in no acute distress HEENT: Normal NECK: No JVD; No carotid bruits LYMPHATICS: No lymphadenopathy CARDIAC: RRR, systolic ejection murmur grade 1/6 best heard right upper portion of the sternum, no rubs, no gallops RESPIRATORY:  Clear to auscultation without rales, wheezing or rhonchi  ABDOMEN: Soft, non-tender, non-distended MUSCULOSKELETAL:  No edema; No deformity  SKIN: Warm and dry NEUROLOGIC:  Alert and oriented x 3 PSYCHIATRIC:  Normal affect   ASSESSMENT:    1. Atrial fibrillation with RVR ([place])   2. Malignant hypertension   3. Preop cardiovascular exam   4. Essential hypertension   5. CKD (chronic kidney disease) stage V requiring chronic dialysis  ([place])    PLAN:    In order of problems listed above:  1. Atrial fibrillation with rapid ventricular rate I see this diagnosis in the computer however there is no recurrences since 2017 when he had it.  I will ask him to have echocardiogram to assess left ventricular ejection fraction as well as left atrial size.  He is not anticoagulated. 2. Hypertension blood pressure mildly elevated today.  This is first visit in my office will continue monitoring adjust medication accordingly. 3. Pretransplant evaluation.  He required kidney transplant.  I will schedule him to have repeated stress test last stress test I see is  from summer 2019 which is more than 1-1/2-year ago.  As a part of evaluation he need also to have echocardiogram to assess aortic stenosis however the physical examination that does not appears to be a significant problem. 4. Chronic kidney disease he is on dialysis followed by nephrology.   Medication Adjustments/Labs and Tests Ordered: Current medicines are reviewed at length with the patient today.  Concerns regarding medicines are outlined above.  Orders Placed This Encounter  Procedures  . MYOCARDIAL PERFUSION IMAGING  . EKG 12-Lead  . ECHOCARDIOGRAM COMPLETE   No orders of the defined types were placed in this encounter.   Signed, Park Liter, MD, [facility] 08/18/2018 9:34 AM    [place]

## 2018-08-19 DIAGNOSIS — N2581 Secondary hyperparathyroidism of renal origin: Secondary | ICD-10-CM | POA: Diagnosis not present

## 2018-08-19 DIAGNOSIS — D509 Iron deficiency anemia, unspecified: Secondary | ICD-10-CM | POA: Diagnosis not present

## 2018-08-19 DIAGNOSIS — D631 Anemia in chronic kidney disease: Secondary | ICD-10-CM | POA: Diagnosis not present

## 2018-08-19 DIAGNOSIS — N186 End stage renal disease: Secondary | ICD-10-CM | POA: Diagnosis not present

## 2018-08-21 DIAGNOSIS — D631 Anemia in chronic kidney disease: Secondary | ICD-10-CM | POA: Diagnosis not present

## 2018-08-21 DIAGNOSIS — N186 End stage renal disease: Secondary | ICD-10-CM | POA: Diagnosis not present

## 2018-08-21 DIAGNOSIS — D509 Iron deficiency anemia, unspecified: Secondary | ICD-10-CM | POA: Diagnosis not present

## 2018-08-21 DIAGNOSIS — N2581 Secondary hyperparathyroidism of renal origin: Secondary | ICD-10-CM | POA: Diagnosis not present

## 2018-08-24 DIAGNOSIS — N2581 Secondary hyperparathyroidism of renal origin: Secondary | ICD-10-CM | POA: Diagnosis not present

## 2018-08-24 DIAGNOSIS — D631 Anemia in chronic kidney disease: Secondary | ICD-10-CM | POA: Diagnosis not present

## 2018-08-24 DIAGNOSIS — N186 End stage renal disease: Secondary | ICD-10-CM | POA: Diagnosis not present

## 2018-08-24 DIAGNOSIS — D509 Iron deficiency anemia, unspecified: Secondary | ICD-10-CM | POA: Diagnosis not present

## 2018-08-26 DIAGNOSIS — N186 End stage renal disease: Secondary | ICD-10-CM | POA: Diagnosis not present

## 2018-08-26 DIAGNOSIS — D631 Anemia in chronic kidney disease: Secondary | ICD-10-CM | POA: Diagnosis not present

## 2018-08-26 DIAGNOSIS — D509 Iron deficiency anemia, unspecified: Secondary | ICD-10-CM | POA: Diagnosis not present

## 2018-08-26 DIAGNOSIS — N2581 Secondary hyperparathyroidism of renal origin: Secondary | ICD-10-CM | POA: Diagnosis not present

## 2018-08-28 DIAGNOSIS — N2581 Secondary hyperparathyroidism of renal origin: Secondary | ICD-10-CM | POA: Diagnosis not present

## 2018-08-28 DIAGNOSIS — D509 Iron deficiency anemia, unspecified: Secondary | ICD-10-CM | POA: Diagnosis not present

## 2018-08-28 DIAGNOSIS — N186 End stage renal disease: Secondary | ICD-10-CM | POA: Diagnosis not present

## 2018-08-28 DIAGNOSIS — D631 Anemia in chronic kidney disease: Secondary | ICD-10-CM | POA: Diagnosis not present

## 2018-08-31 DIAGNOSIS — D631 Anemia in chronic kidney disease: Secondary | ICD-10-CM | POA: Diagnosis not present

## 2018-08-31 DIAGNOSIS — D509 Iron deficiency anemia, unspecified: Secondary | ICD-10-CM | POA: Diagnosis not present

## 2018-08-31 DIAGNOSIS — N186 End stage renal disease: Secondary | ICD-10-CM | POA: Diagnosis not present

## 2018-08-31 DIAGNOSIS — N2581 Secondary hyperparathyroidism of renal origin: Secondary | ICD-10-CM | POA: Diagnosis not present

## 2018-09-02 DIAGNOSIS — D631 Anemia in chronic kidney disease: Secondary | ICD-10-CM | POA: Diagnosis not present

## 2018-09-02 DIAGNOSIS — D509 Iron deficiency anemia, unspecified: Secondary | ICD-10-CM | POA: Diagnosis not present

## 2018-09-02 DIAGNOSIS — N186 End stage renal disease: Secondary | ICD-10-CM | POA: Diagnosis not present

## 2018-09-02 DIAGNOSIS — N2581 Secondary hyperparathyroidism of renal origin: Secondary | ICD-10-CM | POA: Diagnosis not present

## 2018-09-04 DIAGNOSIS — N2581 Secondary hyperparathyroidism of renal origin: Secondary | ICD-10-CM | POA: Diagnosis not present

## 2018-09-04 DIAGNOSIS — N186 End stage renal disease: Secondary | ICD-10-CM | POA: Diagnosis not present

## 2018-09-04 DIAGNOSIS — D631 Anemia in chronic kidney disease: Secondary | ICD-10-CM | POA: Diagnosis not present

## 2018-09-04 DIAGNOSIS — D509 Iron deficiency anemia, unspecified: Secondary | ICD-10-CM | POA: Diagnosis not present

## 2018-09-07 DIAGNOSIS — D509 Iron deficiency anemia, unspecified: Secondary | ICD-10-CM | POA: Diagnosis not present

## 2018-09-07 DIAGNOSIS — N2581 Secondary hyperparathyroidism of renal origin: Secondary | ICD-10-CM | POA: Diagnosis not present

## 2018-09-07 DIAGNOSIS — D631 Anemia in chronic kidney disease: Secondary | ICD-10-CM | POA: Diagnosis not present

## 2018-09-07 DIAGNOSIS — N186 End stage renal disease: Secondary | ICD-10-CM | POA: Diagnosis not present

## 2018-09-09 ENCOUNTER — Telehealth (HOSPITAL_COMMUNITY): Payer: Self-pay | Admitting: *Deleted

## 2018-09-09 DIAGNOSIS — N186 End stage renal disease: Secondary | ICD-10-CM | POA: Diagnosis not present

## 2018-09-09 DIAGNOSIS — D509 Iron deficiency anemia, unspecified: Secondary | ICD-10-CM | POA: Diagnosis not present

## 2018-09-09 DIAGNOSIS — D631 Anemia in chronic kidney disease: Secondary | ICD-10-CM | POA: Diagnosis not present

## 2018-09-09 DIAGNOSIS — N2581 Secondary hyperparathyroidism of renal origin: Secondary | ICD-10-CM | POA: Diagnosis not present

## 2018-09-09 NOTE — Telephone Encounter (Signed)
Left message per patients request when he answered the phone. Message left  on voicemail per DPR in reference to upcoming appointment scheduled on 09/15/18 at [place] with detailed instructions given per Myocardial Perfusion Study Information Sheet for the test. LM to arrive 15 minutes early, and that it is imperative to arrive on time for appointment to keep from having the test rescheduled. If you need to cancel or reschedule your appointment, please call the office within 24 hours of your appointment. Failure to do so may result in a cancellation of your appointment, and a $50 no show fee. Phone number given for call back for any questions. Dayne Dekay, [place]

## 2018-09-11 DIAGNOSIS — N2581 Secondary hyperparathyroidism of renal origin: Secondary | ICD-10-CM | POA: Diagnosis not present

## 2018-09-11 DIAGNOSIS — D509 Iron deficiency anemia, unspecified: Secondary | ICD-10-CM | POA: Diagnosis not present

## 2018-09-11 DIAGNOSIS — D631 Anemia in chronic kidney disease: Secondary | ICD-10-CM | POA: Diagnosis not present

## 2018-09-11 DIAGNOSIS — N186 End stage renal disease: Secondary | ICD-10-CM | POA: Diagnosis not present

## 2018-09-12 DIAGNOSIS — N186 End stage renal disease: Secondary | ICD-10-CM | POA: Diagnosis not present

## 2018-09-12 DIAGNOSIS — Z992 Dependence on renal dialysis: Secondary | ICD-10-CM | POA: Diagnosis not present

## 2018-09-12 DIAGNOSIS — I120 Hypertensive chronic kidney disease with stage 5 chronic kidney disease or end stage renal disease: Secondary | ICD-10-CM | POA: Diagnosis not present

## 2018-09-14 DIAGNOSIS — D509 Iron deficiency anemia, unspecified: Secondary | ICD-10-CM | POA: Diagnosis not present

## 2018-09-14 DIAGNOSIS — N2581 Secondary hyperparathyroidism of renal origin: Secondary | ICD-10-CM | POA: Diagnosis not present

## 2018-09-14 DIAGNOSIS — D631 Anemia in chronic kidney disease: Secondary | ICD-10-CM | POA: Diagnosis not present

## 2018-09-14 DIAGNOSIS — N186 End stage renal disease: Secondary | ICD-10-CM | POA: Diagnosis not present

## 2018-09-15 DIAGNOSIS — H40053 Ocular hypertension, bilateral: Secondary | ICD-10-CM | POA: Diagnosis not present

## 2018-09-16 DIAGNOSIS — D509 Iron deficiency anemia, unspecified: Secondary | ICD-10-CM | POA: Diagnosis not present

## 2018-09-16 DIAGNOSIS — N2581 Secondary hyperparathyroidism of renal origin: Secondary | ICD-10-CM | POA: Diagnosis not present

## 2018-09-16 DIAGNOSIS — N186 End stage renal disease: Secondary | ICD-10-CM | POA: Diagnosis not present

## 2018-09-16 DIAGNOSIS — D631 Anemia in chronic kidney disease: Secondary | ICD-10-CM | POA: Diagnosis not present

## 2018-09-18 DIAGNOSIS — D509 Iron deficiency anemia, unspecified: Secondary | ICD-10-CM | POA: Diagnosis not present

## 2018-09-18 DIAGNOSIS — N2581 Secondary hyperparathyroidism of renal origin: Secondary | ICD-10-CM | POA: Diagnosis not present

## 2018-09-18 DIAGNOSIS — D631 Anemia in chronic kidney disease: Secondary | ICD-10-CM | POA: Diagnosis not present

## 2018-09-18 DIAGNOSIS — N186 End stage renal disease: Secondary | ICD-10-CM | POA: Diagnosis not present

## 2018-09-21 DIAGNOSIS — D631 Anemia in chronic kidney disease: Secondary | ICD-10-CM | POA: Diagnosis not present

## 2018-09-21 DIAGNOSIS — N186 End stage renal disease: Secondary | ICD-10-CM | POA: Diagnosis not present

## 2018-09-21 DIAGNOSIS — N2581 Secondary hyperparathyroidism of renal origin: Secondary | ICD-10-CM | POA: Diagnosis not present

## 2018-09-21 DIAGNOSIS — D509 Iron deficiency anemia, unspecified: Secondary | ICD-10-CM | POA: Diagnosis not present

## 2018-09-23 DIAGNOSIS — N2581 Secondary hyperparathyroidism of renal origin: Secondary | ICD-10-CM | POA: Diagnosis not present

## 2018-09-23 DIAGNOSIS — D631 Anemia in chronic kidney disease: Secondary | ICD-10-CM | POA: Diagnosis not present

## 2018-09-23 DIAGNOSIS — D509 Iron deficiency anemia, unspecified: Secondary | ICD-10-CM | POA: Diagnosis not present

## 2018-09-23 DIAGNOSIS — N186 End stage renal disease: Secondary | ICD-10-CM | POA: Diagnosis not present

## 2018-09-24 ENCOUNTER — Ambulatory Visit (INDEPENDENT_AMBULATORY_CARE_PROVIDER_SITE_OTHER): Payer: Medicare Other

## 2018-09-24 DIAGNOSIS — I4891 Unspecified atrial fibrillation: Secondary | ICD-10-CM

## 2018-09-24 DIAGNOSIS — I10 Essential (primary) hypertension: Secondary | ICD-10-CM | POA: Diagnosis not present

## 2018-09-24 NOTE — Progress Notes (Signed)
Complete echocardiogram has been performed.  Jimmy Fionnuala Hemmerich RDCS, RVT 

## 2018-09-25 DIAGNOSIS — D509 Iron deficiency anemia, unspecified: Secondary | ICD-10-CM | POA: Diagnosis not present

## 2018-09-25 DIAGNOSIS — N2581 Secondary hyperparathyroidism of renal origin: Secondary | ICD-10-CM | POA: Diagnosis not present

## 2018-09-25 DIAGNOSIS — N186 End stage renal disease: Secondary | ICD-10-CM | POA: Diagnosis not present

## 2018-09-25 DIAGNOSIS — D631 Anemia in chronic kidney disease: Secondary | ICD-10-CM | POA: Diagnosis not present

## 2018-09-28 DIAGNOSIS — D509 Iron deficiency anemia, unspecified: Secondary | ICD-10-CM | POA: Diagnosis not present

## 2018-09-28 DIAGNOSIS — N2581 Secondary hyperparathyroidism of renal origin: Secondary | ICD-10-CM | POA: Diagnosis not present

## 2018-09-28 DIAGNOSIS — N186 End stage renal disease: Secondary | ICD-10-CM | POA: Diagnosis not present

## 2018-09-28 DIAGNOSIS — D631 Anemia in chronic kidney disease: Secondary | ICD-10-CM | POA: Diagnosis not present

## 2018-09-30 DIAGNOSIS — N186 End stage renal disease: Secondary | ICD-10-CM | POA: Diagnosis not present

## 2018-09-30 DIAGNOSIS — D509 Iron deficiency anemia, unspecified: Secondary | ICD-10-CM | POA: Diagnosis not present

## 2018-09-30 DIAGNOSIS — N2581 Secondary hyperparathyroidism of renal origin: Secondary | ICD-10-CM | POA: Diagnosis not present

## 2018-09-30 DIAGNOSIS — D631 Anemia in chronic kidney disease: Secondary | ICD-10-CM | POA: Diagnosis not present

## 2018-10-02 DIAGNOSIS — D631 Anemia in chronic kidney disease: Secondary | ICD-10-CM | POA: Diagnosis not present

## 2018-10-02 DIAGNOSIS — D509 Iron deficiency anemia, unspecified: Secondary | ICD-10-CM | POA: Diagnosis not present

## 2018-10-02 DIAGNOSIS — N186 End stage renal disease: Secondary | ICD-10-CM | POA: Diagnosis not present

## 2018-10-02 DIAGNOSIS — N2581 Secondary hyperparathyroidism of renal origin: Secondary | ICD-10-CM | POA: Diagnosis not present

## 2018-10-05 DIAGNOSIS — D631 Anemia in chronic kidney disease: Secondary | ICD-10-CM | POA: Diagnosis not present

## 2018-10-05 DIAGNOSIS — N2581 Secondary hyperparathyroidism of renal origin: Secondary | ICD-10-CM | POA: Diagnosis not present

## 2018-10-05 DIAGNOSIS — D509 Iron deficiency anemia, unspecified: Secondary | ICD-10-CM | POA: Diagnosis not present

## 2018-10-05 DIAGNOSIS — N186 End stage renal disease: Secondary | ICD-10-CM | POA: Diagnosis not present

## 2018-10-07 DIAGNOSIS — D509 Iron deficiency anemia, unspecified: Secondary | ICD-10-CM | POA: Diagnosis not present

## 2018-10-07 DIAGNOSIS — N186 End stage renal disease: Secondary | ICD-10-CM | POA: Diagnosis not present

## 2018-10-07 DIAGNOSIS — D631 Anemia in chronic kidney disease: Secondary | ICD-10-CM | POA: Diagnosis not present

## 2018-10-07 DIAGNOSIS — N2581 Secondary hyperparathyroidism of renal origin: Secondary | ICD-10-CM | POA: Diagnosis not present

## 2018-10-09 DIAGNOSIS — N186 End stage renal disease: Secondary | ICD-10-CM | POA: Diagnosis not present

## 2018-10-09 DIAGNOSIS — D631 Anemia in chronic kidney disease: Secondary | ICD-10-CM | POA: Diagnosis not present

## 2018-10-09 DIAGNOSIS — N2581 Secondary hyperparathyroidism of renal origin: Secondary | ICD-10-CM | POA: Diagnosis not present

## 2018-10-09 DIAGNOSIS — D509 Iron deficiency anemia, unspecified: Secondary | ICD-10-CM | POA: Diagnosis not present

## 2018-10-11 DIAGNOSIS — Z992 Dependence on renal dialysis: Secondary | ICD-10-CM | POA: Diagnosis not present

## 2018-10-11 DIAGNOSIS — N186 End stage renal disease: Secondary | ICD-10-CM | POA: Diagnosis not present

## 2018-10-11 DIAGNOSIS — I120 Hypertensive chronic kidney disease with stage 5 chronic kidney disease or end stage renal disease: Secondary | ICD-10-CM | POA: Diagnosis not present

## 2018-10-12 DIAGNOSIS — N186 End stage renal disease: Secondary | ICD-10-CM | POA: Diagnosis not present

## 2018-10-12 DIAGNOSIS — N2581 Secondary hyperparathyroidism of renal origin: Secondary | ICD-10-CM | POA: Diagnosis not present

## 2018-10-12 DIAGNOSIS — D509 Iron deficiency anemia, unspecified: Secondary | ICD-10-CM | POA: Diagnosis not present

## 2018-10-14 ENCOUNTER — Telehealth: Payer: Self-pay | Admitting: *Deleted

## 2018-10-14 DIAGNOSIS — N186 End stage renal disease: Secondary | ICD-10-CM | POA: Diagnosis not present

## 2018-10-14 DIAGNOSIS — N2581 Secondary hyperparathyroidism of renal origin: Secondary | ICD-10-CM | POA: Diagnosis not present

## 2018-10-14 DIAGNOSIS — D509 Iron deficiency anemia, unspecified: Secondary | ICD-10-CM | POA: Diagnosis not present

## 2018-10-14 NOTE — Telephone Encounter (Signed)
Left message on voicemail per DPR in reference to upcoming appointment scheduled on 10/20/18 at 1115 with detailed instructions given per Myocardial Perfusion Study Information Sheet for the test. LM to arrive 15 minutes early, and that it is imperative to arrive on time for appointment to keep from having the test rescheduled. If you need to cancel or reschedule your appointment, please call the office within 24 hours of your appointment. Failure to do so may result in a cancellation of your appointment, and a $50 no show fee. Phone number given for call back for any questions.  Dyson Sevey, [place]

## 2018-10-15 DIAGNOSIS — N529 Male erectile dysfunction, unspecified: Secondary | ICD-10-CM | POA: Diagnosis not present

## 2018-10-15 DIAGNOSIS — Z79899 Other long term (current) drug therapy: Secondary | ICD-10-CM | POA: Diagnosis not present

## 2018-10-15 DIAGNOSIS — K219 Gastro-esophageal reflux disease without esophagitis: Secondary | ICD-10-CM | POA: Diagnosis not present

## 2018-10-15 DIAGNOSIS — E039 Hypothyroidism, unspecified: Secondary | ICD-10-CM | POA: Diagnosis not present

## 2018-10-15 DIAGNOSIS — I10 Essential (primary) hypertension: Secondary | ICD-10-CM | POA: Diagnosis not present

## 2018-10-15 DIAGNOSIS — E663 Overweight: Secondary | ICD-10-CM | POA: Diagnosis not present

## 2018-10-15 DIAGNOSIS — N186 End stage renal disease: Secondary | ICD-10-CM | POA: Diagnosis not present

## 2018-10-16 DIAGNOSIS — N186 End stage renal disease: Secondary | ICD-10-CM | POA: Diagnosis not present

## 2018-10-16 DIAGNOSIS — D509 Iron deficiency anemia, unspecified: Secondary | ICD-10-CM | POA: Diagnosis not present

## 2018-10-16 DIAGNOSIS — N2581 Secondary hyperparathyroidism of renal origin: Secondary | ICD-10-CM | POA: Diagnosis not present

## 2018-10-19 ENCOUNTER — Other Ambulatory Visit: Payer: Self-pay | Admitting: Cardiology

## 2018-10-19 ENCOUNTER — Other Ambulatory Visit (HOSPITAL_COMMUNITY): Payer: Self-pay | Admitting: Cardiology

## 2018-10-19 DIAGNOSIS — N186 End stage renal disease: Secondary | ICD-10-CM | POA: Diagnosis not present

## 2018-10-19 DIAGNOSIS — D509 Iron deficiency anemia, unspecified: Secondary | ICD-10-CM | POA: Diagnosis not present

## 2018-10-19 DIAGNOSIS — Z01810 Encounter for preprocedural cardiovascular examination: Secondary | ICD-10-CM

## 2018-10-19 DIAGNOSIS — N2581 Secondary hyperparathyroidism of renal origin: Secondary | ICD-10-CM | POA: Diagnosis not present

## 2018-10-21 DIAGNOSIS — N186 End stage renal disease: Secondary | ICD-10-CM | POA: Diagnosis not present

## 2018-10-21 DIAGNOSIS — N2581 Secondary hyperparathyroidism of renal origin: Secondary | ICD-10-CM | POA: Diagnosis not present

## 2018-10-21 DIAGNOSIS — D509 Iron deficiency anemia, unspecified: Secondary | ICD-10-CM | POA: Diagnosis not present

## 2018-10-23 DIAGNOSIS — N186 End stage renal disease: Secondary | ICD-10-CM | POA: Diagnosis not present

## 2018-10-23 DIAGNOSIS — D509 Iron deficiency anemia, unspecified: Secondary | ICD-10-CM | POA: Diagnosis not present

## 2018-10-23 DIAGNOSIS — N2581 Secondary hyperparathyroidism of renal origin: Secondary | ICD-10-CM | POA: Diagnosis not present

## 2018-10-26 DIAGNOSIS — N186 End stage renal disease: Secondary | ICD-10-CM | POA: Diagnosis not present

## 2018-10-26 DIAGNOSIS — N2581 Secondary hyperparathyroidism of renal origin: Secondary | ICD-10-CM | POA: Diagnosis not present

## 2018-10-26 DIAGNOSIS — D509 Iron deficiency anemia, unspecified: Secondary | ICD-10-CM | POA: Diagnosis not present

## 2018-10-28 DIAGNOSIS — D509 Iron deficiency anemia, unspecified: Secondary | ICD-10-CM | POA: Diagnosis not present

## 2018-10-28 DIAGNOSIS — N186 End stage renal disease: Secondary | ICD-10-CM | POA: Diagnosis not present

## 2018-10-28 DIAGNOSIS — N2581 Secondary hyperparathyroidism of renal origin: Secondary | ICD-10-CM | POA: Diagnosis not present

## 2018-10-30 DIAGNOSIS — D509 Iron deficiency anemia, unspecified: Secondary | ICD-10-CM | POA: Diagnosis not present

## 2018-10-30 DIAGNOSIS — N186 End stage renal disease: Secondary | ICD-10-CM | POA: Diagnosis not present

## 2018-10-30 DIAGNOSIS — N2581 Secondary hyperparathyroidism of renal origin: Secondary | ICD-10-CM | POA: Diagnosis not present

## 2018-11-02 DIAGNOSIS — N186 End stage renal disease: Secondary | ICD-10-CM | POA: Diagnosis not present

## 2018-11-02 DIAGNOSIS — D509 Iron deficiency anemia, unspecified: Secondary | ICD-10-CM | POA: Diagnosis not present

## 2018-11-02 DIAGNOSIS — N2581 Secondary hyperparathyroidism of renal origin: Secondary | ICD-10-CM | POA: Diagnosis not present

## 2018-11-04 DIAGNOSIS — N2581 Secondary hyperparathyroidism of renal origin: Secondary | ICD-10-CM | POA: Diagnosis not present

## 2018-11-04 DIAGNOSIS — D509 Iron deficiency anemia, unspecified: Secondary | ICD-10-CM | POA: Diagnosis not present

## 2018-11-04 DIAGNOSIS — N186 End stage renal disease: Secondary | ICD-10-CM | POA: Diagnosis not present

## 2018-11-06 DIAGNOSIS — N2581 Secondary hyperparathyroidism of renal origin: Secondary | ICD-10-CM | POA: Diagnosis not present

## 2018-11-06 DIAGNOSIS — N186 End stage renal disease: Secondary | ICD-10-CM | POA: Diagnosis not present

## 2018-11-06 DIAGNOSIS — D509 Iron deficiency anemia, unspecified: Secondary | ICD-10-CM | POA: Diagnosis not present

## 2018-11-09 DIAGNOSIS — D509 Iron deficiency anemia, unspecified: Secondary | ICD-10-CM | POA: Diagnosis not present

## 2018-11-09 DIAGNOSIS — N2581 Secondary hyperparathyroidism of renal origin: Secondary | ICD-10-CM | POA: Diagnosis not present

## 2018-11-09 DIAGNOSIS — N186 End stage renal disease: Secondary | ICD-10-CM | POA: Diagnosis not present

## 2018-11-11 ENCOUNTER — Telehealth: Payer: Self-pay | Admitting: Cardiology

## 2018-11-11 DIAGNOSIS — I120 Hypertensive chronic kidney disease with stage 5 chronic kidney disease or end stage renal disease: Secondary | ICD-10-CM | POA: Diagnosis not present

## 2018-11-11 DIAGNOSIS — N2581 Secondary hyperparathyroidism of renal origin: Secondary | ICD-10-CM | POA: Diagnosis not present

## 2018-11-11 DIAGNOSIS — N186 End stage renal disease: Secondary | ICD-10-CM | POA: Diagnosis not present

## 2018-11-11 DIAGNOSIS — Z992 Dependence on renal dialysis: Secondary | ICD-10-CM | POA: Diagnosis not present

## 2018-11-11 DIAGNOSIS — D631 Anemia in chronic kidney disease: Secondary | ICD-10-CM | POA: Diagnosis not present

## 2018-11-11 NOTE — Telephone Encounter (Signed)
Called patient and left message to call the office regarding a possible televisit for his 4/7 visit./lbw

## 2018-11-12 NOTE — Telephone Encounter (Signed)
Left message for patient to return call to ask about changing appointment to a televisit.

## 2018-11-13 DIAGNOSIS — N2581 Secondary hyperparathyroidism of renal origin: Secondary | ICD-10-CM | POA: Diagnosis not present

## 2018-11-13 DIAGNOSIS — N186 End stage renal disease: Secondary | ICD-10-CM | POA: Diagnosis not present

## 2018-11-13 DIAGNOSIS — D631 Anemia in chronic kidney disease: Secondary | ICD-10-CM | POA: Diagnosis not present

## 2018-11-13 NOTE — Telephone Encounter (Signed)
Left another message for patient to return call regarding upcoming appointment.

## 2018-11-16 DIAGNOSIS — N2581 Secondary hyperparathyroidism of renal origin: Secondary | ICD-10-CM | POA: Diagnosis not present

## 2018-11-16 DIAGNOSIS — N186 End stage renal disease: Secondary | ICD-10-CM | POA: Diagnosis not present

## 2018-11-16 DIAGNOSIS — D631 Anemia in chronic kidney disease: Secondary | ICD-10-CM | POA: Diagnosis not present

## 2018-11-17 ENCOUNTER — Ambulatory Visit: Payer: Medicare Other | Admitting: Cardiology

## 2018-11-17 NOTE — Telephone Encounter (Signed)
Left another message for patient to return call regarding appointment today needing to be switched to a televisit.

## 2018-11-17 NOTE — Telephone Encounter (Signed)
Left another message for patient to return call regarding visit today.

## 2018-11-18 DIAGNOSIS — N2581 Secondary hyperparathyroidism of renal origin: Secondary | ICD-10-CM | POA: Diagnosis not present

## 2018-11-18 DIAGNOSIS — N186 End stage renal disease: Secondary | ICD-10-CM | POA: Diagnosis not present

## 2018-11-18 DIAGNOSIS — D631 Anemia in chronic kidney disease: Secondary | ICD-10-CM | POA: Diagnosis not present

## 2018-11-20 DIAGNOSIS — N186 End stage renal disease: Secondary | ICD-10-CM | POA: Diagnosis not present

## 2018-11-20 DIAGNOSIS — N2581 Secondary hyperparathyroidism of renal origin: Secondary | ICD-10-CM | POA: Diagnosis not present

## 2018-11-20 DIAGNOSIS — D631 Anemia in chronic kidney disease: Secondary | ICD-10-CM | POA: Diagnosis not present

## 2018-11-23 DIAGNOSIS — N2581 Secondary hyperparathyroidism of renal origin: Secondary | ICD-10-CM | POA: Diagnosis not present

## 2018-11-23 DIAGNOSIS — D631 Anemia in chronic kidney disease: Secondary | ICD-10-CM | POA: Diagnosis not present

## 2018-11-23 DIAGNOSIS — N186 End stage renal disease: Secondary | ICD-10-CM | POA: Diagnosis not present

## 2018-11-25 DIAGNOSIS — D631 Anemia in chronic kidney disease: Secondary | ICD-10-CM | POA: Diagnosis not present

## 2018-11-25 DIAGNOSIS — N186 End stage renal disease: Secondary | ICD-10-CM | POA: Diagnosis not present

## 2018-11-25 DIAGNOSIS — N2581 Secondary hyperparathyroidism of renal origin: Secondary | ICD-10-CM | POA: Diagnosis not present

## 2018-11-27 DIAGNOSIS — D631 Anemia in chronic kidney disease: Secondary | ICD-10-CM | POA: Diagnosis not present

## 2018-11-27 DIAGNOSIS — N2581 Secondary hyperparathyroidism of renal origin: Secondary | ICD-10-CM | POA: Diagnosis not present

## 2018-11-27 DIAGNOSIS — N186 End stage renal disease: Secondary | ICD-10-CM | POA: Diagnosis not present

## 2018-11-30 DIAGNOSIS — N2581 Secondary hyperparathyroidism of renal origin: Secondary | ICD-10-CM | POA: Diagnosis not present

## 2018-11-30 DIAGNOSIS — N186 End stage renal disease: Secondary | ICD-10-CM | POA: Diagnosis not present

## 2018-11-30 DIAGNOSIS — D631 Anemia in chronic kidney disease: Secondary | ICD-10-CM | POA: Diagnosis not present

## 2018-12-02 DIAGNOSIS — N186 End stage renal disease: Secondary | ICD-10-CM | POA: Diagnosis not present

## 2018-12-02 DIAGNOSIS — D631 Anemia in chronic kidney disease: Secondary | ICD-10-CM | POA: Diagnosis not present

## 2018-12-02 DIAGNOSIS — N2581 Secondary hyperparathyroidism of renal origin: Secondary | ICD-10-CM | POA: Diagnosis not present

## 2018-12-04 DIAGNOSIS — N186 End stage renal disease: Secondary | ICD-10-CM | POA: Diagnosis not present

## 2018-12-04 DIAGNOSIS — D631 Anemia in chronic kidney disease: Secondary | ICD-10-CM | POA: Diagnosis not present

## 2018-12-04 DIAGNOSIS — N2581 Secondary hyperparathyroidism of renal origin: Secondary | ICD-10-CM | POA: Diagnosis not present

## 2018-12-07 DIAGNOSIS — N2581 Secondary hyperparathyroidism of renal origin: Secondary | ICD-10-CM | POA: Diagnosis not present

## 2018-12-07 DIAGNOSIS — D631 Anemia in chronic kidney disease: Secondary | ICD-10-CM | POA: Diagnosis not present

## 2018-12-07 DIAGNOSIS — N186 End stage renal disease: Secondary | ICD-10-CM | POA: Diagnosis not present

## 2018-12-09 DIAGNOSIS — N2581 Secondary hyperparathyroidism of renal origin: Secondary | ICD-10-CM | POA: Diagnosis not present

## 2018-12-09 DIAGNOSIS — D631 Anemia in chronic kidney disease: Secondary | ICD-10-CM | POA: Diagnosis not present

## 2018-12-09 DIAGNOSIS — N186 End stage renal disease: Secondary | ICD-10-CM | POA: Diagnosis not present

## 2018-12-11 DIAGNOSIS — N2581 Secondary hyperparathyroidism of renal origin: Secondary | ICD-10-CM | POA: Diagnosis not present

## 2018-12-11 DIAGNOSIS — N186 End stage renal disease: Secondary | ICD-10-CM | POA: Diagnosis not present

## 2018-12-11 DIAGNOSIS — Z992 Dependence on renal dialysis: Secondary | ICD-10-CM | POA: Diagnosis not present

## 2018-12-14 DIAGNOSIS — N2581 Secondary hyperparathyroidism of renal origin: Secondary | ICD-10-CM | POA: Diagnosis not present

## 2018-12-14 DIAGNOSIS — N186 End stage renal disease: Secondary | ICD-10-CM | POA: Diagnosis not present

## 2018-12-16 DIAGNOSIS — N186 End stage renal disease: Secondary | ICD-10-CM | POA: Diagnosis not present

## 2018-12-16 DIAGNOSIS — N2581 Secondary hyperparathyroidism of renal origin: Secondary | ICD-10-CM | POA: Diagnosis not present

## 2018-12-18 DIAGNOSIS — N186 End stage renal disease: Secondary | ICD-10-CM | POA: Diagnosis not present

## 2018-12-18 DIAGNOSIS — N2581 Secondary hyperparathyroidism of renal origin: Secondary | ICD-10-CM | POA: Diagnosis not present

## 2018-12-20 ENCOUNTER — Emergency Department (HOSPITAL_COMMUNITY): Payer: Medicare Other

## 2018-12-20 ENCOUNTER — Encounter (HOSPITAL_COMMUNITY): Admission: EM | Disposition: E | Payer: Self-pay | Source: Home / Self Care | Attending: Neurology

## 2018-12-20 ENCOUNTER — Inpatient Hospital Stay (HOSPITAL_COMMUNITY)
Admission: EM | Admit: 2018-12-20 | Discharge: 2019-01-11 | DRG: 870 | Disposition: E | Payer: Medicare Other | Attending: Neurology | Admitting: Neurology

## 2018-12-20 ENCOUNTER — Encounter (HOSPITAL_COMMUNITY): Payer: Self-pay | Admitting: Emergency Medicine

## 2018-12-20 ENCOUNTER — Other Ambulatory Visit: Payer: Self-pay

## 2018-12-20 ENCOUNTER — Inpatient Hospital Stay (HOSPITAL_COMMUNITY): Payer: Medicare Other

## 2018-12-20 ENCOUNTER — Inpatient Hospital Stay (HOSPITAL_COMMUNITY): Payer: Medicare Other | Admitting: Certified Registered Nurse Anesthetist

## 2018-12-20 DIAGNOSIS — D6959 Other secondary thrombocytopenia: Secondary | ICD-10-CM | POA: Diagnosis not present

## 2018-12-20 DIAGNOSIS — R4781 Slurred speech: Secondary | ICD-10-CM | POA: Diagnosis not present

## 2018-12-20 DIAGNOSIS — I161 Hypertensive emergency: Secondary | ICD-10-CM | POA: Diagnosis present

## 2018-12-20 DIAGNOSIS — G8194 Hemiplegia, unspecified affecting left nondominant side: Secondary | ICD-10-CM | POA: Diagnosis present

## 2018-12-20 DIAGNOSIS — A419 Sepsis, unspecified organism: Secondary | ICD-10-CM | POA: Diagnosis not present

## 2018-12-20 DIAGNOSIS — R29810 Facial weakness: Secondary | ICD-10-CM | POA: Diagnosis present

## 2018-12-20 DIAGNOSIS — R6521 Severe sepsis with septic shock: Secondary | ICD-10-CM | POA: Diagnosis not present

## 2018-12-20 DIAGNOSIS — Z933 Colostomy status: Secondary | ICD-10-CM | POA: Diagnosis not present

## 2018-12-20 DIAGNOSIS — E872 Acidosis: Secondary | ICD-10-CM | POA: Diagnosis not present

## 2018-12-20 DIAGNOSIS — I340 Nonrheumatic mitral (valve) insufficiency: Secondary | ICD-10-CM

## 2018-12-20 DIAGNOSIS — Z66 Do not resuscitate: Secondary | ICD-10-CM | POA: Diagnosis not present

## 2018-12-20 DIAGNOSIS — I480 Paroxysmal atrial fibrillation: Secondary | ICD-10-CM | POA: Diagnosis not present

## 2018-12-20 DIAGNOSIS — R531 Weakness: Secondary | ICD-10-CM | POA: Diagnosis not present

## 2018-12-20 DIAGNOSIS — J189 Pneumonia, unspecified organism: Secondary | ICD-10-CM

## 2018-12-20 DIAGNOSIS — J9690 Respiratory failure, unspecified, unspecified whether with hypoxia or hypercapnia: Secondary | ICD-10-CM | POA: Diagnosis not present

## 2018-12-20 DIAGNOSIS — R471 Dysarthria and anarthria: Secondary | ICD-10-CM | POA: Diagnosis present

## 2018-12-20 DIAGNOSIS — D631 Anemia in chronic kidney disease: Secondary | ICD-10-CM | POA: Diagnosis not present

## 2018-12-20 DIAGNOSIS — R7881 Bacteremia: Secondary | ICD-10-CM | POA: Diagnosis not present

## 2018-12-20 DIAGNOSIS — I120 Hypertensive chronic kidney disease with stage 5 chronic kidney disease or end stage renal disease: Secondary | ICD-10-CM | POA: Diagnosis not present

## 2018-12-20 DIAGNOSIS — E663 Overweight: Secondary | ICD-10-CM | POA: Diagnosis present

## 2018-12-20 DIAGNOSIS — R4702 Dysphasia: Secondary | ICD-10-CM | POA: Diagnosis not present

## 2018-12-20 DIAGNOSIS — I76 Septic arterial embolism: Secondary | ICD-10-CM | POA: Diagnosis present

## 2018-12-20 DIAGNOSIS — N186 End stage renal disease: Secondary | ICD-10-CM | POA: Diagnosis not present

## 2018-12-20 DIAGNOSIS — N2581 Secondary hyperparathyroidism of renal origin: Secondary | ICD-10-CM | POA: Diagnosis present

## 2018-12-20 DIAGNOSIS — I2690 Septic pulmonary embolism without acute cor pulmonale: Secondary | ICD-10-CM | POA: Diagnosis present

## 2018-12-20 DIAGNOSIS — E871 Hypo-osmolality and hyponatremia: Secondary | ICD-10-CM | POA: Diagnosis present

## 2018-12-20 DIAGNOSIS — H538 Other visual disturbances: Secondary | ICD-10-CM | POA: Diagnosis present

## 2018-12-20 DIAGNOSIS — G9341 Metabolic encephalopathy: Secondary | ICD-10-CM | POA: Diagnosis not present

## 2018-12-20 DIAGNOSIS — D649 Anemia, unspecified: Secondary | ICD-10-CM | POA: Diagnosis not present

## 2018-12-20 DIAGNOSIS — R011 Cardiac murmur, unspecified: Secondary | ICD-10-CM | POA: Diagnosis not present

## 2018-12-20 DIAGNOSIS — I499 Cardiac arrhythmia, unspecified: Secondary | ICD-10-CM | POA: Diagnosis not present

## 2018-12-20 DIAGNOSIS — E781 Pure hyperglyceridemia: Secondary | ICD-10-CM | POA: Diagnosis present

## 2018-12-20 DIAGNOSIS — I639 Cerebral infarction, unspecified: Secondary | ICD-10-CM

## 2018-12-20 DIAGNOSIS — I669 Occlusion and stenosis of unspecified cerebral artery: Secondary | ICD-10-CM | POA: Diagnosis not present

## 2018-12-20 DIAGNOSIS — I6503 Occlusion and stenosis of bilateral vertebral arteries: Secondary | ICD-10-CM | POA: Diagnosis not present

## 2018-12-20 DIAGNOSIS — Z880 Allergy status to penicillin: Secondary | ICD-10-CM | POA: Diagnosis not present

## 2018-12-20 DIAGNOSIS — R000 Tachycardia, unspecified: Secondary | ICD-10-CM | POA: Diagnosis not present

## 2018-12-20 DIAGNOSIS — R918 Other nonspecific abnormal finding of lung field: Secondary | ICD-10-CM | POA: Diagnosis not present

## 2018-12-20 DIAGNOSIS — Y92230 Patient room in hospital as the place of occurrence of the external cause: Secondary | ICD-10-CM | POA: Diagnosis not present

## 2018-12-20 DIAGNOSIS — I959 Hypotension, unspecified: Secondary | ICD-10-CM | POA: Diagnosis not present

## 2018-12-20 DIAGNOSIS — R197 Diarrhea, unspecified: Secondary | ICD-10-CM | POA: Diagnosis not present

## 2018-12-20 DIAGNOSIS — J9601 Acute respiratory failure with hypoxia: Secondary | ICD-10-CM | POA: Diagnosis not present

## 2018-12-20 DIAGNOSIS — J810 Acute pulmonary edema: Secondary | ICD-10-CM | POA: Diagnosis not present

## 2018-12-20 DIAGNOSIS — T426X5A Adverse effect of other antiepileptic and sedative-hypnotic drugs, initial encounter: Secondary | ICD-10-CM | POA: Diagnosis not present

## 2018-12-20 DIAGNOSIS — I63411 Cerebral infarction due to embolism of right middle cerebral artery: Secondary | ICD-10-CM | POA: Diagnosis present

## 2018-12-20 DIAGNOSIS — B9561 Methicillin susceptible Staphylococcus aureus infection as the cause of diseases classified elsewhere: Secondary | ICD-10-CM | POA: Diagnosis not present

## 2018-12-20 DIAGNOSIS — R4189 Other symptoms and signs involving cognitive functions and awareness: Secondary | ICD-10-CM | POA: Diagnosis present

## 2018-12-20 DIAGNOSIS — J988 Other specified respiratory disorders: Secondary | ICD-10-CM | POA: Diagnosis not present

## 2018-12-20 DIAGNOSIS — I6340 Cerebral infarction due to embolism of unspecified cerebral artery: Secondary | ICD-10-CM | POA: Diagnosis not present

## 2018-12-20 DIAGNOSIS — Z992 Dependence on renal dialysis: Secondary | ICD-10-CM

## 2018-12-20 DIAGNOSIS — I69392 Facial weakness following cerebral infarction: Secondary | ICD-10-CM | POA: Diagnosis not present

## 2018-12-20 DIAGNOSIS — A4101 Sepsis due to Methicillin susceptible Staphylococcus aureus: Secondary | ICD-10-CM | POA: Diagnosis not present

## 2018-12-20 DIAGNOSIS — Z79899 Other long term (current) drug therapy: Secondary | ICD-10-CM

## 2018-12-20 DIAGNOSIS — R509 Fever, unspecified: Secondary | ICD-10-CM | POA: Diagnosis not present

## 2018-12-20 DIAGNOSIS — R1310 Dysphagia, unspecified: Secondary | ICD-10-CM | POA: Diagnosis present

## 2018-12-20 DIAGNOSIS — R29708 NIHSS score 8: Secondary | ICD-10-CM | POA: Diagnosis present

## 2018-12-20 DIAGNOSIS — I330 Acute and subacute infective endocarditis: Secondary | ICD-10-CM | POA: Diagnosis present

## 2018-12-20 DIAGNOSIS — J849 Interstitial pulmonary disease, unspecified: Secondary | ICD-10-CM | POA: Diagnosis not present

## 2018-12-20 DIAGNOSIS — I6601 Occlusion and stenosis of right middle cerebral artery: Secondary | ICD-10-CM | POA: Diagnosis not present

## 2018-12-20 DIAGNOSIS — Z8249 Family history of ischemic heart disease and other diseases of the circulatory system: Secondary | ICD-10-CM

## 2018-12-20 DIAGNOSIS — Z6827 Body mass index (BMI) 27.0-27.9, adult: Secondary | ICD-10-CM

## 2018-12-20 DIAGNOSIS — Z7189 Other specified counseling: Secondary | ICD-10-CM

## 2018-12-20 DIAGNOSIS — Z515 Encounter for palliative care: Secondary | ICD-10-CM | POA: Diagnosis not present

## 2018-12-20 DIAGNOSIS — Z452 Encounter for adjustment and management of vascular access device: Secondary | ICD-10-CM | POA: Diagnosis not present

## 2018-12-20 DIAGNOSIS — I748 Embolism and thrombosis of other arteries: Secondary | ICD-10-CM | POA: Diagnosis present

## 2018-12-20 DIAGNOSIS — R680 Hypothermia, not associated with low environmental temperature: Secondary | ICD-10-CM | POA: Diagnosis not present

## 2018-12-20 DIAGNOSIS — Z9911 Dependence on respirator [ventilator] status: Secondary | ICD-10-CM | POA: Diagnosis not present

## 2018-12-20 DIAGNOSIS — J984 Other disorders of lung: Secondary | ICD-10-CM | POA: Diagnosis not present

## 2018-12-20 DIAGNOSIS — I4891 Unspecified atrial fibrillation: Secondary | ICD-10-CM | POA: Diagnosis not present

## 2018-12-20 DIAGNOSIS — D72829 Elevated white blood cell count, unspecified: Secondary | ICD-10-CM | POA: Diagnosis not present

## 2018-12-20 DIAGNOSIS — Z978 Presence of other specified devices: Secondary | ICD-10-CM | POA: Diagnosis not present

## 2018-12-20 DIAGNOSIS — Z20828 Contact with and (suspected) exposure to other viral communicable diseases: Secondary | ICD-10-CM | POA: Diagnosis present

## 2018-12-20 DIAGNOSIS — Z4682 Encounter for fitting and adjustment of non-vascular catheter: Secondary | ICD-10-CM | POA: Diagnosis not present

## 2018-12-20 DIAGNOSIS — G9340 Encephalopathy, unspecified: Secondary | ICD-10-CM | POA: Diagnosis not present

## 2018-12-20 DIAGNOSIS — I080 Rheumatic disorders of both mitral and aortic valves: Secondary | ICD-10-CM | POA: Diagnosis not present

## 2018-12-20 DIAGNOSIS — E785 Hyperlipidemia, unspecified: Secondary | ICD-10-CM | POA: Diagnosis present

## 2018-12-20 DIAGNOSIS — R0602 Shortness of breath: Secondary | ICD-10-CM | POA: Diagnosis not present

## 2018-12-20 DIAGNOSIS — R451 Restlessness and agitation: Secondary | ICD-10-CM | POA: Diagnosis not present

## 2018-12-20 DIAGNOSIS — I952 Hypotension due to drugs: Secondary | ICD-10-CM | POA: Diagnosis not present

## 2018-12-20 DIAGNOSIS — R0682 Tachypnea, not elsewhere classified: Secondary | ICD-10-CM | POA: Diagnosis not present

## 2018-12-20 DIAGNOSIS — J9600 Acute respiratory failure, unspecified whether with hypoxia or hypercapnia: Secondary | ICD-10-CM

## 2018-12-20 DIAGNOSIS — Z8673 Personal history of transient ischemic attack (TIA), and cerebral infarction without residual deficits: Secondary | ICD-10-CM | POA: Diagnosis not present

## 2018-12-20 DIAGNOSIS — I361 Nonrheumatic tricuspid (valve) insufficiency: Secondary | ICD-10-CM | POA: Diagnosis not present

## 2018-12-20 DIAGNOSIS — I69354 Hemiplegia and hemiparesis following cerebral infarction affecting left non-dominant side: Secondary | ICD-10-CM | POA: Diagnosis not present

## 2018-12-20 HISTORY — DX: Occlusion and stenosis of right middle cerebral artery: I66.01

## 2018-12-20 HISTORY — DX: Paroxysmal atrial fibrillation: I48.0

## 2018-12-20 HISTORY — PX: IR ANGIO VERTEBRAL SEL SUBCLAVIAN INNOMINATE UNI R MOD SED: IMG5365

## 2018-12-20 HISTORY — PX: IR ANGIO VERTEBRAL SEL VERTEBRAL UNI L MOD SED: IMG5367

## 2018-12-20 HISTORY — PX: IR ANGIO INTRA EXTRACRAN SEL COM CAROTID INNOMINATE BILAT MOD SED: IMG5360

## 2018-12-20 HISTORY — DX: Acute and subacute infective endocarditis: I33.0

## 2018-12-20 HISTORY — DX: Bacteremia: R78.81

## 2018-12-20 HISTORY — DX: Cerebral infarction, unspecified: I63.9

## 2018-12-20 HISTORY — PX: RADIOLOGY WITH ANESTHESIA: SHX6223

## 2018-12-20 HISTORY — DX: Methicillin susceptible Staphylococcus aureus infection as the cause of diseases classified elsewhere: B95.61

## 2018-12-20 HISTORY — DX: Anemia, unspecified: D64.9

## 2018-12-20 LAB — BLOOD GAS, ARTERIAL
Acid-Base Excess: 2.7 mmol/L — ABNORMAL HIGH (ref 0.0–2.0)
Acid-Base Excess: 3.3 mmol/L — ABNORMAL HIGH (ref 0.0–2.0)
Bicarbonate: 25.8 mmol/L (ref 20.0–28.0)
Bicarbonate: 26.8 mmol/L (ref 20.0–28.0)
Drawn by: 257081
Drawn by: 244901
FIO2: 0.5
FIO2: 40
MECHVT: 500 mL
O2 Saturation: 99.1 %
O2 Saturation: 98.2 %
PEEP: 5 cmH2O
PEEP: 5 cmH2O
Patient temperature: 100.2
Patient temperature: 100.2
RATE: 16 {breaths}/min
RATE: 14 resp/min
pCO2 arterial: 34.8 mmHg (ref 32.0–48.0)
pCO2 arterial: 38.4 mmHg (ref 32.0–48.0)
pH, Arterial: 7.487 — ABNORMAL HIGH (ref 7.350–7.450)
pH, Arterial: 7.462 — ABNORMAL HIGH (ref 7.350–7.450)
pO2, Arterial: 154 mmHg — ABNORMAL HIGH (ref 83.0–108.0)
pO2, Arterial: 129 mmHg — ABNORMAL HIGH (ref 83.0–108.0)

## 2018-12-20 LAB — APTT: aPTT: 37 seconds — ABNORMAL HIGH (ref 24–36)

## 2018-12-20 LAB — CBC
HCT: 31.6 % — ABNORMAL LOW (ref 39.0–52.0)
Hemoglobin: 11 g/dL — ABNORMAL LOW (ref 13.0–17.0)
MCH: 32.1 pg (ref 26.0–34.0)
MCHC: 34.8 g/dL (ref 30.0–36.0)
MCV: 92.1 fL (ref 80.0–100.0)
Platelets: 75 10*3/uL — ABNORMAL LOW (ref 150–400)
RBC: 3.43 MIL/uL — ABNORMAL LOW (ref 4.22–5.81)
RDW: 14.8 % (ref 11.5–15.5)
WBC: 20.2 10*3/uL — ABNORMAL HIGH (ref 4.0–10.5)
nRBC: 0 % (ref 0.0–0.2)

## 2018-12-20 LAB — I-STAT CREATININE, ED: Creatinine, Ser: 12.5 mg/dL — ABNORMAL HIGH (ref 0.61–1.24)

## 2018-12-20 LAB — DIFFERENTIAL
Abs Immature Granulocytes: 0.97 10*3/uL — ABNORMAL HIGH (ref 0.00–0.07)
Basophils Absolute: 0 10*3/uL (ref 0.0–0.1)
Basophils Relative: 0 %
Eosinophils Absolute: 0.1 10*3/uL (ref 0.0–0.5)
Eosinophils Relative: 0 %
Immature Granulocytes: 5 %
Lymphocytes Relative: 2 %
Lymphs Abs: 0.5 10*3/uL — ABNORMAL LOW (ref 0.7–4.0)
Monocytes Absolute: 0.9 10*3/uL (ref 0.1–1.0)
Monocytes Relative: 5 %
Neutro Abs: 17.7 10*3/uL — ABNORMAL HIGH (ref 1.7–7.7)
Neutrophils Relative %: 88 %

## 2018-12-20 LAB — COMPREHENSIVE METABOLIC PANEL
ALT: 21 U/L (ref 0–44)
AST: 53 U/L — ABNORMAL HIGH (ref 15–41)
Albumin: 3.3 g/dL — ABNORMAL LOW (ref 3.5–5.0)
Alkaline Phosphatase: 102 U/L (ref 38–126)
Anion gap: 23 — ABNORMAL HIGH (ref 5–15)
BUN: 50 mg/dL — ABNORMAL HIGH (ref 6–20)
CO2: 25 mmol/L (ref 22–32)
Calcium: 9.3 mg/dL (ref 8.9–10.3)
Chloride: 80 mmol/L — ABNORMAL LOW (ref 98–111)
Creatinine, Ser: 12.49 mg/dL — ABNORMAL HIGH (ref 0.61–1.24)
GFR calc Af Amer: 5 mL/min — ABNORMAL LOW (ref >60)
GFR calc non Af Amer: 4 mL/min — ABNORMAL LOW (ref >60)
Glucose, Bld: 101 mg/dL — ABNORMAL HIGH (ref 70–99)
Potassium: 4.5 mmol/L (ref 3.5–5.1)
Sodium: 128 mmol/L — ABNORMAL LOW (ref 135–145)
Total Bilirubin: 1.3 mg/dL — ABNORMAL HIGH (ref 0.3–1.2)
Total Protein: 6.8 g/dL (ref 6.5–8.1)

## 2018-12-20 LAB — LACTIC ACID, PLASMA
Lactic Acid, Venous: 3.3 mmol/L (ref 0.5–1.9)
Lactic Acid, Venous: 1.5 mmol/L (ref 0.5–1.9)

## 2018-12-20 LAB — CBG MONITORING, ED: Glucose-Capillary: 105 mg/dL — ABNORMAL HIGH (ref 70–99)

## 2018-12-20 LAB — TRIGLYCERIDES: Triglycerides: 623 mg/dL — ABNORMAL HIGH (ref <150)

## 2018-12-20 LAB — ECHOCARDIOGRAM COMPLETE
Height: 69 in
Weight: 3008.84 oz

## 2018-12-20 LAB — PROTIME-INR
INR: 1.7 — ABNORMAL HIGH (ref 0.8–1.2)
Prothrombin Time: 19.6 seconds — ABNORMAL HIGH (ref 11.4–15.2)

## 2018-12-20 LAB — SARS CORONAVIRUS 2 BY RT PCR (HOSPITAL ORDER, PERFORMED IN ~~LOC~~ HOSPITAL LAB): SARS Coronavirus 2: NEGATIVE

## 2018-12-20 LAB — MRSA PCR SCREENING: MRSA by PCR: NEGATIVE

## 2018-12-20 SURGERY — RADIOLOGY WITH ANESTHESIA
Anesthesia: General

## 2018-12-20 MED ORDER — PROPOFOL 1000 MG/100ML IV EMUL
INTRAVENOUS | Status: AC
  Filled 2018-12-20: qty 100

## 2018-12-20 MED ORDER — SODIUM CHLORIDE 0.9 % IV SOLN
2.0000 g | Freq: Three times a day (TID) | INTRAVENOUS | Status: DC
  Administered 2018-12-20 (×2): 2 g via INTRAVENOUS
  Filled 2018-12-20 (×4): qty 2

## 2018-12-20 MED ORDER — TIROFIBAN HCL IN NACL 5-0.9 MG/100ML-% IV SOLN
INTRAVENOUS | Status: AC
  Filled 2018-12-20: qty 100

## 2018-12-20 MED ORDER — LIDOCAINE HCL 1 % IJ SOLN
INTRAMUSCULAR | Status: AC
  Filled 2018-12-20: qty 20

## 2018-12-20 MED ORDER — DEXMEDETOMIDINE HCL IN NACL 200 MCG/50ML IV SOLN
0.0000 ug/kg/h | INTRAVENOUS | Status: DC
  Administered 2018-12-20: 0.4 ug/kg/h via INTRAVENOUS
  Filled 2018-12-20 (×2): qty 50

## 2018-12-20 MED ORDER — ASPIRIN 81 MG PO CHEW
CHEWABLE_TABLET | ORAL | Status: AC
  Filled 2018-12-20: qty 1

## 2018-12-20 MED ORDER — LORAZEPAM 2 MG/ML IJ SOLN
INTRAMUSCULAR | Status: AC
  Administered 2018-12-20: 2 mg
  Filled 2018-12-20: qty 1

## 2018-12-20 MED ORDER — ACETAMINOPHEN 650 MG RE SUPP
650.0000 mg | RECTAL | Status: DC | PRN
  Administered 2018-12-20: 13:00:00 650 mg via RECTAL

## 2018-12-20 MED ORDER — IOHEXOL 350 MG/ML SOLN
150.0000 mL | Freq: Once | INTRAVENOUS | Status: AC | PRN
  Administered 2018-12-20: 13:00:00 125 mL via INTRAVENOUS

## 2018-12-20 MED ORDER — VANCOMYCIN HCL IN DEXTROSE 1-5 GM/200ML-% IV SOLN
1000.0000 mg | Freq: Once | INTRAVENOUS | Status: AC
  Administered 2018-12-20: 1000 mg via INTRAVENOUS
  Filled 2018-12-20: qty 200

## 2018-12-20 MED ORDER — MIDAZOLAM HCL 2 MG/2ML IJ SOLN
INTRAMUSCULAR | Status: AC
  Administered 2018-12-21: 10:00:00 2 mg via INTRAVENOUS
  Filled 2018-12-20: qty 2

## 2018-12-20 MED ORDER — PROPOFOL 1000 MG/100ML IV EMUL
5.0000 ug/kg/min | INTRAVENOUS | Status: DC
  Administered 2018-12-20 – 2018-12-21 (×2): 15 ug/kg/min via INTRAVENOUS
  Filled 2018-12-20 (×2): qty 100

## 2018-12-20 MED ORDER — ORAL CARE MOUTH RINSE
15.0000 mL | OROMUCOSAL | Status: DC
  Administered 2018-12-20 – 2018-12-31 (×109): 15 mL via OROMUCOSAL

## 2018-12-20 MED ORDER — FENTANYL CITRATE (PF) 100 MCG/2ML IJ SOLN
50.0000 ug | INTRAMUSCULAR | Status: DC | PRN
  Administered 2018-12-21 – 2018-12-23 (×4): 100 ug via INTRAVENOUS
  Administered 2018-12-24: 23:00:00 50 ug via INTRAVENOUS
  Administered 2018-12-24 – 2018-12-25 (×3): 100 ug via INTRAVENOUS
  Administered 2018-12-25: 50 ug via INTRAVENOUS
  Administered 2018-12-25 (×2): 100 ug via INTRAVENOUS
  Administered 2018-12-25 – 2018-12-26 (×6): 50 ug via INTRAVENOUS
  Administered 2018-12-26 – 2018-12-30 (×3): 100 ug via INTRAVENOUS
  Filled 2018-12-20 (×18): qty 2

## 2018-12-20 MED ORDER — STROKE: EARLY STAGES OF RECOVERY BOOK
Freq: Once | Status: AC
  Administered 2018-12-20: 17:00:00

## 2018-12-20 MED ORDER — SODIUM CHLORIDE 0.9% FLUSH: 3.0000 mL | Freq: Once | INTRAVENOUS | Status: DC

## 2018-12-20 MED ORDER — FENTANYL CITRATE (PF) 100 MCG/2ML IJ SOLN
INTRAMUSCULAR | Status: DC | PRN
  Administered 2018-12-20 (×2): 50 ug via INTRAVENOUS

## 2018-12-20 MED ORDER — LABETALOL HCL 5 MG/ML IV SOLN
INTRAVENOUS | Status: DC | PRN
  Administered 2018-12-20 (×2): 5 mg via INTRAVENOUS

## 2018-12-20 MED ORDER — ACETAMINOPHEN 160 MG/5ML PO SOLN
650.0000 mg | ORAL | Status: DC | PRN
  Administered 2018-12-24: 650 mg

## 2018-12-20 MED ORDER — SODIUM CHLORIDE 0.9 % IV SOLN
INTRAVENOUS | Status: DC
  Administered 2018-12-20: 15:00:00 via INTRAVENOUS

## 2018-12-20 MED ORDER — NITROGLYCERIN 1 MG/10 ML FOR IR/CATH LAB
INTRA_ARTERIAL | Status: AC
  Filled 2018-12-20: qty 10

## 2018-12-20 MED ORDER — FENTANYL CITRATE (PF) 100 MCG/2ML IJ SOLN
50.0000 ug | INTRAMUSCULAR | Status: AC | PRN
  Administered 2018-12-20 (×3): 50 ug via INTRAVENOUS
  Filled 2018-12-20 (×3): qty 2

## 2018-12-20 MED ORDER — ESMOLOL HCL 100 MG/10ML IV SOLN
INTRAVENOUS | Status: DC | PRN
  Administered 2018-12-20: 50 mg via INTRAVENOUS

## 2018-12-20 MED ORDER — LACTATED RINGERS IV SOLN
INTRAVENOUS | Status: DC | PRN
  Administered 2018-12-20: 14:00:00 via INTRAVENOUS

## 2018-12-20 MED ORDER — CLEVIDIPINE BUTYRATE 0.5 MG/ML IV EMUL
0.0000 mg/h | INTRAVENOUS | Status: DC
  Administered 2018-12-20: 17:00:00 18 mg/h via INTRAVENOUS
  Administered 2018-12-20: 16:00:00 1 mg/h via INTRAVENOUS
  Filled 2018-12-20 (×2): qty 50

## 2018-12-20 MED ORDER — TICAGRELOR 90 MG PO TABS
ORAL_TABLET | ORAL | Status: AC
  Filled 2018-12-20: qty 2

## 2018-12-20 MED ORDER — SENNOSIDES-DOCUSATE SODIUM 8.6-50 MG PO TABS
1.0000 | ORAL_TABLET | Freq: Every evening | ORAL | Status: DC | PRN
  Administered 2018-12-24: 1 via ORAL
  Filled 2018-12-20: qty 1

## 2018-12-20 MED ORDER — VANCOMYCIN HCL IN DEXTROSE 1-5 GM/200ML-% IV SOLN
INTRAVENOUS | Status: AC
  Filled 2018-12-20: qty 200

## 2018-12-20 MED ORDER — EPTIFIBATIDE 20 MG/10ML IV SOLN
INTRAVENOUS | Status: AC
  Filled 2018-12-20: qty 10

## 2018-12-20 MED ORDER — IPRATROPIUM BROMIDE 0.02 % IN SOLN: 0.5000 mg | RESPIRATORY_TRACT | Status: DC | PRN

## 2018-12-20 MED ORDER — SODIUM CHLORIDE 0.9 % IV BOLUS
1000.0000 mL | Freq: Once | INTRAVENOUS | Status: AC
  Administered 2018-12-20: 19:00:00 1000 mL via INTRAVENOUS

## 2018-12-20 MED ORDER — CLOPIDOGREL BISULFATE 300 MG PO TABS
ORAL_TABLET | ORAL | Status: AC
  Filled 2018-12-20: qty 1

## 2018-12-20 MED ORDER — ROCURONIUM BROMIDE 50 MG/5ML IV SOLN
INTRAVENOUS | Status: AC | PRN
  Administered 2018-12-20: 100 mg via INTRAVENOUS

## 2018-12-20 MED ORDER — ACETAMINOPHEN 325 MG PO TABS: 650.0000 mg | ORAL_TABLET | ORAL | Status: DC | PRN

## 2018-12-20 MED ORDER — ALBUTEROL SULFATE (2.5 MG/3ML) 0.083% IN NEBU
2.5000 mg | INHALATION_SOLUTION | RESPIRATORY_TRACT | Status: DC | PRN
  Administered 2018-12-23: 2.5 mg via RESPIRATORY_TRACT
  Filled 2018-12-20 (×2): qty 3

## 2018-12-20 MED ORDER — ETOMIDATE 2 MG/ML IV SOLN
INTRAVENOUS | Status: AC | PRN
  Administered 2018-12-20: 20 mg via INTRAVENOUS

## 2018-12-20 MED ORDER — SODIUM CHLORIDE 0.9 % IV SOLN: INTRAVENOUS | Status: DC

## 2018-12-20 MED ORDER — DEXAMETHASONE SODIUM PHOSPHATE 4 MG/ML IJ SOLN
INTRAMUSCULAR | Status: DC | PRN
  Administered 2018-12-20: 5 mg via INTRAVENOUS

## 2018-12-20 MED ORDER — PROPOFOL 1000 MG/100ML IV EMUL
INTRAVENOUS | Status: AC | PRN
  Administered 2018-12-20: 30 mg via INTRAVENOUS

## 2018-12-20 MED ORDER — FENTANYL CITRATE (PF) 100 MCG/2ML IJ SOLN
INTRAMUSCULAR | Status: AC
  Administered 2018-12-20: 50 ug
  Filled 2018-12-20: qty 2

## 2018-12-20 MED ORDER — SODIUM CHLORIDE 0.9 % IV SOLN
1000.0000 mL | INTRAVENOUS | Status: DC
  Administered 2018-12-20: 1000 mL via INTRAVENOUS

## 2018-12-20 MED ORDER — FENTANYL CITRATE (PF) 100 MCG/2ML IJ SOLN
INTRAMUSCULAR | Status: AC
  Filled 2018-12-20: qty 2

## 2018-12-20 MED ORDER — CHLORHEXIDINE GLUCONATE 0.12% ORAL RINSE (MEDLINE KIT)
15.0000 mL | Freq: Two times a day (BID) | OROMUCOSAL | Status: DC
  Administered 2018-12-20 – 2018-12-31 (×22): 15 mL via OROMUCOSAL

## 2018-12-20 MED ORDER — IOHEXOL 300 MG/ML  SOLN: 54.0000 mL | Freq: Once | INTRAMUSCULAR | Status: DC | PRN

## 2018-12-20 MED ORDER — PHENYLEPHRINE HCL-NACL 10-0.9 MG/250ML-% IV SOLN
0.0000 ug/min | INTRAVENOUS | Status: DC
  Administered 2018-12-20: 20 ug/min via INTRAVENOUS
  Administered 2018-12-21 (×3): 200 ug/min via INTRAVENOUS
  Administered 2018-12-21: 50 ug/min via INTRAVENOUS
  Filled 2018-12-20 (×6): qty 250
  Filled 2018-12-20: qty 500

## 2018-12-20 NOTE — Progress Notes (Signed)
MD notified of patient' blood pressure.  New orders received.  RN will continue to monitor.

## 2018-12-20 NOTE — Progress Notes (Signed)
Systolic BP has dropped to the 70's. Clevidipine has been stopped. NS 1000 cc bolus ordered. On 75 cc/hr NS. Mild hyponatremia at 128. Suspected sepsis on admission. Contacted Dr. Chase Caller of [facility] who will see the patient to determine if pressors or other management is needed in the context of hypotension and sepsis. The patient is status post cerebral angiogram with no clot retraction possible due to distal location of the thrombus.   Electronically signed: Dr. Kerney Elbe

## 2018-12-20 NOTE — Code Documentation (Addendum)
CODE STROKE:  Patient arrived via [facility] at 1138 with left sided weakness, left facial droop, and dysarthria. LKN 2000 last night when patient went to bed. At some point, patient did get up last night, but he did not recall that time. Family noted that patient had weakness, per family, patient sustained a fall. + Temp 100.3, + SOB, + cough, +nausea, and has history of AFIB but not on ATC. NIH 8.   CTH/CTA/CTP done, scans were delayed because patient was shaking uncontrobally with chills and rigor. Ativan 2mg  IV and Fentanyl 25 mcg IV were given in CT, scans complete, and patient was taken back to Trauma A. Rectal temp of 103.9, 1252 - Neuro MD made [place] aware of decision for IR, also now has LT Visual Field cut per Neuro MD. 8182 - decision for intubation in ED. RSI done at 1304. Patient was given Etomidate 20 mg and Rocuronium 100 mg IV for intubation. OG placed and CXR/KUB completed. Propofol initiated at 30 mcg/kg/min and Versed 2 mg IV given as well. Taken to IR at 1330.   Sister Stpehen Petitjean [phone] and [phone]  Start Time 1135 End Time 1345

## 2018-12-20 NOTE — Procedures (Signed)
S/P 4vessel cerebral arteriogram RT CFA approach  Findings. 1.RT MCA   Sup division M3 branch tapered occlusion.

## 2018-12-20 NOTE — Consult Note (Addendum)
NEURO HOSPITALIST CONSULT NOTE   Requestig physician: Dr. Sabra Heck  Reason for Consult: Acute onset of left hemiparesis and left facial droop with dysarthria  History obtained from:   Patient, EMS and Chart     HPI:                                                                                                                                          Thomas Adams is an 58 y.o. male with atrial fibrillation, HTN and ESRD on HD who presents to the ED via EMS with acute onset of left hemiparesis and left facial droop with dysarthria. LKN was 8:00 PM last night when he went to bed. He states that he got up at night then went back to sleep, but he does not recall when. Family noted him to be weak this morning. EMS was called. The patient is currently unaware of having a deficit. CBG 105. Temp 100.8.   He denies a prior history of stroke.   He has a history of atrial fibrillation but in not on anticoagulation.   NIHSS = 8  Past Medical History:  Diagnosis Date  . CKD (chronic kidney disease), stage III ([place])   . Complication of anesthesia    " i HAD A HARD TIME WAKING WHEN THEY PUT MY FISTULA IN "  . Hypertension   . Renal insufficiency    dialysis 08/2013, tuesday/thursday/saturday    Past Surgical History:  Procedure Laterality Date  . AV FISTULA PLACEMENT Left 09/22/2013   Procedure: ARTERIOVENOUS (AV) FISTULA CREATION RADIOCEPHALIC;  Surgeon: Elam Dutch, MD;  Location: [facility];  Service: Vascular;  Laterality: Left;  . DIALYSIS FISTULA CREATION    . INSERTION OF DIALYSIS CATHETER Right 09/22/2013   Procedure: INSERTION OF DIALYSIS CATHETER; ULTRASOUND GUIDED;  Surgeon: Elam Dutch, MD;  Location: [place];  Service: Vascular;  Laterality: Right;  . IR GENERIC HISTORICAL  12/14/2015   IR RADIOLOGIST EVAL & MGMT 12/14/2015 Aletta Edouard, MD [facility] INTERV RAD  . REMOVAL OF A DIALYSIS CATHETER Right 09/22/2013   Procedure: REMOVAL OF A DIALYSIS CATHETER OF TEMPORARY  RIGHT INTERNAL JUGULAR ;  Surgeon: Elam Dutch, MD;  Location: [facility] OR;  Service: Vascular;  Laterality: Right;    Family History  Problem Relation Age of Onset  . Hypertension Father               Social History:  reports that he has never smoked. He has never used smokeless tobacco. He reports that he does not drink alcohol or use drugs.  Allergies  Allergen Reactions  . Penicillins Nausea Only, Rash and Other (See Comments)    also dizziness    MEDICATIONS:  Amlodipine Hydralazine Labetalol Lisinopril Sevelamer   ROS:                                                                                                                                       The patient denies having any symptoms. He does not appear aware that he may be having a stroke.   Weight 87.2 kg.   General Examination:                                                                                                       Physical Exam  HEENT-  [place]/AT    Lungs- Respirations unlabored.  Extremities- Right AV fistula is noted.    Neurological Examination Mental Status: Awake and alert. Fully oriented. NAD. Able to follow all motor commands and answer questions regarding his recent history. Speech is fluent but with significant dysarthria.  Cranial Nerves: II: Visual fields intact with each eye tested individually. Extinction on the left with DSS.   III,IV, VI: Rightward gaze preference but able to cross midline to the left with some hesitation. No nystagmus, but saccadic quality to pursuits is noted.  V,VII: Prominent left facial droop. Temp sensation equal bilaterally. Positive for extinction to DSS on the left.  VIII: Hearing intact to voice.  IX,X: Pharyngeal dysarthria noted.  XI: Shoulder shrug lag on the left.  XII: Tongue deviates to the left Motor: RUE and RLE 5/5 LUE with  increased flexor and extensor tone, increased latency of motor responses, incoordination, parietal drift and 4/5 weakness proximally and distally LLE 4+/5 with drift and increased latency of motor responses Sensory: Temp and light touch intact in bilateral upper and lower extremities. Extinction on the left to DSS.  Deep Tendon Reflexes: 3+ and symmetric BUE and patellae. 0 achilles bilaterally. Right toe downgoing, left toe upgoing.  Cerebellar: Ataxic movements of LUE Gait: Deferred   Lab Results: Basic Metabolic Panel: Recent Labs  Lab 12/11/2018 1146  CREATININE 12.50*    CBC: No results for input(s): WBC, NEUTROABS, HGB, HCT, MCV, PLT in the last 168 hours.  Cardiac Enzymes: No results for input(s): CKTOTAL, CKMB, CKMBINDEX, TROPONINI in the last 168 hours.  Lipid Panel: No results for input(s): CHOL, TRIG, HDL, CHOLHDL, VLDL, LDLCALC in the last 168 hours.  Imaging: No results found.  Assessment: 58 year old male presenting with acute onset of left hemiparesis and left facial droop with dysarthria 1. CT head negative for acute abnormality. Incidental left globus pallidus calcification is  noted. 2. Exam best localizes as a right parietofrontal or basal ganglia lesion, most likely an acute ischemic infarction.  3. Not a tPA candidate due to time criteria.  4. Will assess for LVO and possible perfusion deficit with CTA/CTP  Recommendations: 1. STAT CTA head and neck with CTP 2. Will need to have contrast dialyzed off after CTA, but can wait until after VIR if endovascular treatment is indicated.   50 minutes spent in the emergent neurological evaluation and management of this critically ill acute stroke patient.   Addendum: -- CTA shows mid M2 occlusion on the right. There is associated 9 mL perfusion deficit with no core. Images reviewed while discussing with Radiology -- The patient is a VIR candidate. He cannot provide informed consent due to anosognosia and left  hemineglect. Patient's mother contacted over the telephone. She consents to VIR and intubation prior to procedure. Telephone conversation witnessed by Dr. Alvino Chapel. She states that she wants everything done to help her son.  -- Code VIR has been called to [facility]  Additional 20 minutes spent in emergent patient management.  Electronically signed: Dr. Kerney Elbe  12/24/2018, 11:53 AM

## 2018-12-20 NOTE — Progress Notes (Signed)
Angiogram completed by Dr. Estanislado Pandy, who discussed the case with me. The occlusion is in a right M3 branch that is not accessible for thrombectomy.   Coronavirus testing came back negative.   Patient is septic with a white count of 20.2 in addition to the fever noted earlier.   BP 139/88   Pulse (!) 128   Temp (!) 103.9 F (39.9 C) (Rectal)   Resp 19   Ht 5\' 9"  (1.753 m)   Wt 87.2 kg   SpO2 100%   BMI 28.39 kg/m   A/R: --  Being transported to 4N ICU -- No antiplatelet medications or anticoagulants for at least 24 hours from angiogram. Dr. Estanislado Pandy recommends starting ASA tomorrow if follow up CT head at 2:30 PM is negative for hemorrhage.  -- Will obtain MRI brain in the interim -- Nephrology consult for dialysis following CTA and conventional 4 vessel angiogram with high contrast loads.  -- [facility] consult for sepsis management, as well as vent management -- On cefepime and vancomycin  Additional 20 minutes of critical care time, including coordination of care with 3 separate teams.   Electronically signed: Dr. Kerney Elbe

## 2018-12-20 NOTE — Consult Note (Signed)
[facility]    NEPHROLOGY CONSULTATION NOTE  PATIENT ID:  Thomas Adams, DOB:  [birth year]  HPI: The patient is a 58 y.o. year old male patient with a past medical history significant for atrial fibrillation, hypertension, end-stage renal disease on hemodialysis Tuesday, Thursday, and Saturday from [facility] who presented to the emergency department via EMS with acute onset of left hemiparesis and left facial droop with dysarthria.  He was last known to be normal at 8 PM last night when he went to bed.  He got up at night and went back to sleep but does not know when that was.  His family noted him to be weak in the morning.  He came in via EMS to the emergency department.  He was also found to be febrile with a temperature of 100.8.  He underwent a CT angiogram and a interventional angiogram for evaluation and the occlusion was not accessible for thrombectomy.  He was started on cefepime and vancomycin for broad-spectrum coverage due to his fever, and cultures were obtained.  The patient is intubated and sedated and is unable to provide any other associated history.  Renal consultation has been called for end-stage renal disease on hemodialysis.   Past Medical History:  Diagnosis Date  . CKD (chronic kidney disease), stage III ([place])   . Complication of anesthesia    " i HAD A HARD TIME WAKING WHEN THEY PUT MY FISTULA IN "  . Hypertension   . Renal insufficiency    dialysis 08/2013, tuesday/thursday/saturday    Past Surgical History:  Procedure Laterality Date  . AV FISTULA PLACEMENT Left 09/22/2013   Procedure: ARTERIOVENOUS (AV) FISTULA CREATION RADIOCEPHALIC;  Surgeon: Elam Dutch, MD;  Location: [facility];  Service: Vascular;  Laterality: Left;  . DIALYSIS FISTULA CREATION    . INSERTION OF DIALYSIS CATHETER Right 09/22/2013   Procedure: INSERTION OF DIALYSIS CATHETER; ULTRASOUND GUIDED;  Surgeon: Elam Dutch, MD;  Location: [facility];  Service: Vascular;   Laterality: Right;  . IR GENERIC HISTORICAL  12/14/2015   IR RADIOLOGIST EVAL & MGMT 12/14/2015 Aletta Edouard, MD [facility] INTERV RAD  . REMOVAL OF A DIALYSIS CATHETER Right 09/22/2013   Procedure: REMOVAL OF A DIALYSIS CATHETER OF TEMPORARY RIGHT INTERNAL JUGULAR ;  Surgeon: Elam Dutch, MD;  Location: [facility] OR;  Service: Vascular;  Laterality: Right;    Family History  Problem Relation Age of Onset  . Hypertension Father     Social History   Tobacco Use  . Smoking status: Never Smoker  . Smokeless tobacco: Never Used  Substance Use Topics  . Alcohol use: No  . Drug use: No    REVIEW OF SYSTEMS: Unobtainable due to the patient being intubated and sedated.    PHYSICAL EXAM:  Vitals:   12/19/2018 1456 12/25/2018 1500  BP: 110/76 109/78  Pulse: (!) 106 (!) 106  Resp: (!) 7 (!) 21  Temp:  100.2 F (37.9 C)  SpO2: 100% 100%   No intake/output data recorded.   General: Intubated, sedated, in no acute distress  HEENT: MMM [place] AT anicteric sclera Neck:  No JVD, no adenopathy CV:  Heart RRR, tachycardic Lungs:  L/S CTA bilaterally Abd:  abd SNT/ND with normal BS GU:  Bladder non-palpable Extremities:  No LE edema, left upper extremity AV fistula. Skin:  No skin rash Psych:  normal mood and affect Neuro:  no focal deficits   CURRENT MEDICATIONS:  .  stroke: mapping our early stages of  recovery book   Does not apply Once  . chlorhexidine gluconate (MEDLINE KIT)  15 mL Mouth Rinse BID  . mouth rinse  15 mL Mouth Rinse 10 times per day  . midazolam      . nitroGLYCERIN      . sodium chloride flush  3 mL Intravenous Once  . ticagrelor         HOME MEDICATIONS:  Prior to Admission medications   Medication Sig Start Date End Date Taking? Authorizing Provider  amLODipine (NORVASC) 10 MG tablet Take 1 tablet (10 mg total) by mouth daily. 03/05/16   Burns, Arloa Koh, MD  hydrALAZINE (APRESOLINE) 25 MG tablet Take 1 tablet (25 mg total) by mouth every 8 (eight) hours. 09/23/13    Delfina Redwood, MD  labetalol (NORMODYNE) 200 MG tablet Take 1 tablet (200 mg total) by mouth 2 (two) times daily. Patient taking differently: Take 100 mg by mouth 2 (two) times daily.  09/23/13   Delfina Redwood, MD  lisinopril (PRINIVIL,ZESTRIL) 20 MG tablet Take 1 tablet by mouth daily. 12/18/15   [provider]  sevelamer carbonate (RENVELA) 800 MG tablet Take 2,400 mg by mouth 3 (three) times daily.    [provider]       LABS:  CBC Latest Ref Rng & Units 12/14/2018 03/05/2016 03/04/2016  WBC 4.0 - 10.5 K/uL 20.2(H) 6.0 -  Hemoglobin 13.0 - 17.0 g/dL 11.0(L) 8.8(L) 7.8(L)  Hematocrit 39.0 - 52.0 % 31.6(L) 28.1(L) 23.0(L)  Platelets 150 - 400 K/uL 75(L) 144(L) -    CMP Latest Ref Rng & Units 12/13/2018 12/21/2018 03/05/2016  Glucose 70 - 99 mg/dL - 101(H) 83  BUN 6 - 20 mg/dL - 50(H) 18  Creatinine 0.61 - 1.24 mg/dL 12.50(H) 12.49(H) 6.73(H)  Sodium 135 - 145 mmol/L - 128(L) 137  Potassium 3.5 - 5.1 mmol/L - 4.5 4.0  Chloride 98 - 111 mmol/L - 80(L) 99(L)  CO2 22 - 32 mmol/L - 25 30  Calcium 8.9 - 10.3 mg/dL - 9.3 8.9  Total Protein 6.5 - 8.1 g/dL - 6.8 -  Total Bilirubin 0.3 - 1.2 mg/dL - 1.3(H) -  Alkaline Phos 38 - 126 U/L - 102 -  AST 15 - 41 U/L - 53(H) -  ALT 0 - 44 U/L - 21 -    Lab Results  Component Value Date   PTH 1,127.3 (H) 09/18/2013   CALCIUM 9.3 01/02/2019   CAION 0.75 (L) 03/04/2016   PHOS 4.3 03/05/2016       Component Value Date/Time   COLORURINE YELLOW 09/18/2013 2013   APPEARANCEUR CLEAR 09/18/2013 2013   LABSPEC 1.010 09/18/2013 2013   PHURINE 6.5 09/18/2013 2013   GLUCOSEU NEGATIVE 09/18/2013 2013   HGBUR SMALL (A) 09/18/2013 2013   BILIRUBINUR NEGATIVE 09/18/2013 2013   KETONESUR NEGATIVE 09/18/2013 2013   PROTEINUR 100 (A) 09/18/2013 2013   UROBILINOGEN 0.2 09/18/2013 2013   NITRITE NEGATIVE 09/18/2013 2013   LEUKOCYTESUR NEGATIVE 09/18/2013 2013      Component Value Date/Time   PHART 7.487 (H) 01/03/2019 1505    PCO2ART 34.8 12/29/2018 1505   PO2ART 154 (H) 01/03/2019 1505   HCO3 25.8 12/18/2018 1505   TCO2 22 03/04/2016 1206   O2SAT 99.1 01/10/2019 1505       Component Value Date/Time   IRON 34 (L) 09/18/2013 0100   TIBC 230 09/18/2013 0100   FERRITIN 63 09/18/2013 0100   IRONPCTSAT 15 (L) 09/18/2013 0100       ASSESSMENT/PLAN:  Problem List Items Addressed This Visit      Cardiovascular and Mediastinum   Stroke [facility])   Relevant Medications   nitroGLYCERIN 100 mcg/mL intra-arterial injection   clevidipine (CLEVIPREX) infusion 0.5 mg/mL   Other Relevant Orders   IR ANGIO INTRA EXTRACRAN SEL COM CAROTID INNOMINATE BILAT MOD SED   IR ANGIO VERTEBRAL SEL SUBCLAVIAN INNOMINATE UNI R MOD SED   IR ANGIO VERTEBRAL SEL VERTEBRAL UNI L MOD SED   IR ANGIO INTRA EXTRACRAN SEL COM CAROTID INNOMINATE BILAT MOD SED   IR ANGIO VERTEBRAL SEL SUBCLAVIAN INNOMINATE UNI R MOD SED   IR ANGIO VERTEBRAL SEL VERTEBRAL UNI L MOD SED    Other Visit Diagnoses    Acute ischemic stroke (HCC)    -  Primary   Relevant Medications   nitroGLYCERIN 100 mcg/mL intra-arterial injection   clevidipine (CLEVIPREX) infusion 0.5 mg/mL   Fever, unspecified fever cause         The patient is a 58 y.o. year old male patient with a past medical history significant for atrial fibrillation, hypertension, end-stage renal disease on hemodialysis Tuesday, Thursday, and Saturday from [facility] who presented to the emergency department via EMS with acute onset of left hemiparesis and left facial droop with dysarthria.    1.  End-stage renal disease on hemodialysis.  We will continue his usual schedule on Tuesday, Thursday, Saturday.  No urgent indication for dialysis currently.  Would like to avoid dialysis in the setting of an acute stroke.    2.  Acute ischemic stroke.  Unable to perform thrombectomy due to location.  Aspirin to be started tomorrow.  Continue supportive care.  Hold outpatient  antihypertensives.  3.  Secondary hyperparathyroidism.  Resume outpatient dosing of phosphate binders and calcitriol.  4.  Anemia of chronic kidney disease.  Hemoglobin is above goal.  Would avoid ESA's in the setting of an acute stroke.  5.  Fever, unclear etiology.  Is on vancomycin and cefepime for now.  Cultures are pending.  No clear source of infection so far.    [place], DO, [place]

## 2018-12-20 NOTE — Progress Notes (Addendum)
Patient arrived to unit 4N23 at 1450.  Right groin site level 0.  Pulses palpable.  Initial vitals and neuro assessment charted at 1450.  Critical care and nephrology MD at bedside.    Neurology paged regarding BP goals and neuro assessment.  New orders received.  RN will continue to monitor.

## 2018-12-20 NOTE — Anesthesia Procedure Notes (Signed)
Arterial Line Insertion Start/End05/04/2019 1:54 PM, 12/20/2018 2:02 PM Performed by: Oleta Mouse, MD, anesthesiologist  Patient location: OR. Preanesthetic checklist: patient identified, IV checked, site marked, risks and benefits discussed, surgical consent, monitors and equipment checked, pre-op evaluation, timeout performed and anesthesia consent Right, radial was placed Catheter size: 22 G Hand hygiene performed  and maximum sterile barriers used   Attempts: 1 Procedure performed without using ultrasound guided technique. Following insertion, dressing applied and Biopatch. Post procedure assessment: normal and unchanged  Patient tolerated the procedure well with no immediate complications.

## 2018-12-20 NOTE — ED Notes (Addendum)
Date and time results received: 12/26/2018 1425 (use smartphrase ".now" to insert current time)  Test: Lactic Acid Critical Value: 3.3  Name of Provider Notified: Danae Chen, RN with IR  Orders Received? Or Actions Taken?:

## 2018-12-20 NOTE — ED Provider Notes (Signed)
[facility]ERG[facility] DEPARTMENT Provider Note   CSN: [id number] Arrival date & time: 12/25/2018  1138    History   Chief Complaint No chief complaint on file.   HPI Thomas Adams is a 58 y.o. male.     HPI  I presented at triage at 11:38 AM to assist with resuscitation of this code stroke patient  The patient is a 58 year old male, he has a history of end-stage renal disease on dialysis.  He has a history of this since 2015 on Tuesday Thursdays and Saturdays.  He also has atrial fibrillation, he takes medications including hydralazine, labetalol, lisinopril and amlodipine.  We do not see any anticoagulant use.  The patient presents after he was found to be confused this morning, he had left-sided facial droop, difficulty seeing in his left field of vision, possibly some left arm and leg weakness.  The patient was also found to have a temperature of 100.8 and has expressed some coughing.  The symptoms appear to have started since 8:00 last night when he was last seen normal.  The patient denies any other symptoms.  It is difficult to get a history because of the patient's difficulty following commands.  The history was obtained from the paramedics.  Past Medical History:  Diagnosis Date   CKD (chronic kidney disease), stage III ([place])    Complication of anesthesia    " i HAD A HARD TIME WAKING WHEN THEY PUT MY FISTULA IN "   Hypertension    Renal insufficiency    dialysis 08/2013, tuesday/thursday/saturday    Patient Active Problem List   Diagnosis Date Noted   Preop cardiovascular exam 08/18/2018   Atrial fibrillation with RVR ([place]) 03/04/2016   Essential hypertension 08/09/2014   End stage renal disease ([place]) 11/04/2013   CKD (chronic kidney disease) stage V requiring chronic dialysis ([place]) 09/23/2013   Acute renal failure ([place]) 09/18/2013   Malignant hypertension 09/18/2013   Anemia 09/18/2013    Past Surgical History:  Procedure Laterality  Date   AV FISTULA PLACEMENT Left 09/22/2013   Procedure: ARTERIOVENOUS (AV) FISTULA CREATION RADIOCEPHALIC;  Surgeon: Elam Dutch, MD;  Location: [facility] OR;  Service: Vascular;  Laterality: Left;   DIALYSIS FISTULA CREATION     INSERTION OF DIALYSIS CATHETER Right 09/22/2013   Procedure: INSERTION OF DIALYSIS CATHETER; ULTRASOUND GUIDED;  Surgeon: Elam Dutch, MD;  Location: [facility];  Service: Vascular;  Laterality: Right;   IR GENERIC HISTORICAL  12/14/2015   IR RADIOLOGIST EVAL & MGMT 12/14/2015 Aletta Edouard, MD [facility] INTERV RAD   REMOVAL OF A DIALYSIS CATHETER Right 09/22/2013   Procedure: REMOVAL OF A DIALYSIS CATHETER OF TEMPORARY RIGHT INTERNAL JUGULAR ;  Surgeon: Elam Dutch, MD;  Location: [facility];  Service: Vascular;  Laterality: Right;        Home Medications    Prior to Admission medications   Medication Sig Start Date End Date Taking? Authorizing Provider  amLODipine (NORVASC) 10 MG tablet Take 1 tablet (10 mg total) by mouth daily. 03/05/16   Burns, Arloa Koh, MD  hydrALAZINE (APRESOLINE) 25 MG tablet Take 1 tablet (25 mg total) by mouth every 8 (eight) hours. 09/23/13   Delfina Redwood, MD  labetalol (NORMODYNE) 200 MG tablet Take 1 tablet (200 mg total) by mouth 2 (two) times daily. 09/23/13   Delfina Redwood, MD  lisinopril (PRINIVIL,ZESTRIL) 20 MG tablet Take 1 tablet by mouth daily. 12/18/15   [provider]  sevelamer carbonate (RENVELA) 800  MG tablet Take 2,400 mg by mouth 3 (three) times daily.    [provider]    Family History Family History  Problem Relation Age of Onset   Hypertension Father     Social History Social History   Tobacco Use   Smoking status: Never Smoker   Smokeless tobacco: Never Used  Substance Use Topics   Alcohol use: No   Drug use: No     Allergies   Penicillins   Review of Systems Review of Systems  Unable to perform ROS: Acuity of condition     Physical Exam Updated Vital Signs Wt  87.2 kg    BMI 28.39 kg/m   Physical Exam Vitals signs and nursing note reviewed.  Constitutional:      General: He is in acute distress.     Appearance: He is well-developed. He is ill-appearing.  HENT:     Head: Normocephalic and atraumatic.     Mouth/Throat:     Pharynx: No oropharyngeal exudate.  Eyes:     General: No scleral icterus.       Right eye: No discharge.        Left eye: No discharge.     Conjunctiva/sclera: Conjunctivae normal.     Pupils: Pupils are equal, round, and reactive to light.  Neck:     Musculoskeletal: Normal range of motion and neck supple.     Thyroid: No thyromegaly.     Vascular: No JVD.  Cardiovascular:     Rate and Rhythm: Regular rhythm. Tachycardia present.     Heart sounds: Normal heart sounds. No murmur. No friction rub. No gallop.      Comments: Fistula in the left forearm with a good thrill, no overlying redness. Pulmonary:     Effort: Pulmonary effort is normal. No respiratory distress.     Breath sounds: Normal breath sounds. No wheezing or rales.  Abdominal:     General: Bowel sounds are normal. There is no distension.     Palpations: Abdomen is soft. There is no mass.     Tenderness: There is no abdominal tenderness.  Musculoskeletal: Normal range of motion.        General: No tenderness.  Lymphadenopathy:     Cervical: No cervical adenopathy.  Skin:    General: Skin is warm and dry.     Findings: No erythema or rash.  Neurological:     Mental Status: He is alert.     Coordination: Coordination normal.     Comments: The patient has left-sided facial droop, he cannot track to the left side with his eyes, he cannot see in the left visual fields.  He has a slight weakness of left arm and left leg.  He is able to follow commands otherwise.  Psychiatric:        Behavior: Behavior normal.      ED Treatments / Results  Labs (all labs ordered are listed, but only abnormal results are displayed) Labs Reviewed  CBG MONITORING, ED  - Abnormal; Notable for the following components:      Result Value   Glucose-Capillary 105 (*)    All other components within normal limits  SARS CORONAVIRUS 2 (HOSPITAL ORDER, [facility])  CULTURE, BLOOD (ROUTINE X 2)  CULTURE, BLOOD (ROUTINE X 2)  PROTIME-INR  APTT  CBC  DIFFERENTIAL  COMPREHENSIVE METABOLIC PANEL  LACTIC ACID, PLASMA  LACTIC ACID, PLASMA  URINALYSIS, ROUTINE W REFLEX MICROSCOPIC  I-STAT CREATININE, ED  EKG EKG Interpretation  Date/Time:  Sunday Dec 20 2018 12:28:40 EDT Ventricular Rate:  120 PR Interval:    QRS Duration: 95 QT Interval:  295 QTC Calculation: 417 R Axis:   67 Text Interpretation:  Sinus tachycardia Probable left atrial enlargement Repol abnrm suggests ischemia, anterolateral Baseline wander in lead(s) II III aVF Since last tracing rate faster LVH present Confirmed by Noemi Chapel [phone]) on 12/30/2018 1:14:27 PM   Radiology Ct Angio Head W Or Wo Contrast  Result Date: 01/08/2019 CLINICAL DATA:  Left-sided weakness, facial droop, and slurred speech. EXAM: CT ANGIOGRAPHY HEAD AND NECK CT PERFUSION BRAIN TECHNIQUE: Multidetector CT imaging of the head and neck was performed using the standard protocol during bolus administration of intravenous contrast. Multiplanar CT image reconstructions and MIPs were obtained to evaluate the vascular anatomy. Carotid stenosis measurements (when applicable) are obtained utilizing NASCET criteria, using the distal internal carotid diameter as the denominator. Multiphase CT imaging of the brain was performed following IV bolus contrast injection. Subsequent parametric perfusion maps were calculated using RAPID software. CONTRAST:  126m OMNIPAQUE IOHEXOL 350 MG/ML SOLN COMPARISON:  None. FINDINGS: CTA NECK FINDINGS The study is motion degraded including moderate to severe motion artifact through the larynx which limits assessment of the carotid bifurcations. Aortic arch: Standard 3  vessel aortic arch with mild atherosclerotic plaque. No significant arch vessel origin stenosis. Right carotid system: Patent with moderate calcified atherosclerosis about the carotid bifurcation. No evidence of dissection or significant stenosis within limitations of motion artifact. Left carotid system: Patent with mild calcified atherosclerosis about the carotid bifurcation without evidence of significant stenosis or dissection. Vertebral arteries: Patent with the left being mildly dominant. Calcified atherosclerosis at the vertebral artery origins results in moderate to severe stenosis bilaterally. Skeleton: Mild cervical disc degeneration. 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 Upper chest: Clear lung apices. Review of the MIP images confirms the above findings CTA HEAD FINDINGS Anterior circulation: The internal carotid arteries are patent from skull base to carotid termini with calcified atherosclerosis resulting in mild cavernous stenosis on the right. The right M1 segment is widely patent. Branch vessel assessment is limited by motion artifact, however there is a suspected mid right M2 branch vessel occlusion. The left MCA appears duplicated with suggestion of severe stenosis of the more superior origin although motion artifact may be contributing to this appearance. ACAs are patent without evidence of flow limiting proximal stenosis. No aneurysm is identified. Posterior circulation: The intracranial vertebral arteries are patent to the basilar without evidence of significant stenosis. P ICAs and a ICAs are not well evaluated. SCA is are patent. The basilar artery is widely patent. There is a small right posterior communicating artery. PCAs are patent without evidence of flow limiting proximal stenosis allowing for suspected artifactual narrowing in the proximal right P2 segment. No aneurysm is identified. Venous sinuses: Patent. Anatomic variants: None. Review of the MIP  images confirms the above findings CT Brain Perfusion Findings: ASPECTS: 10 CBF (<30%) Volume: 0194mPerfusion (Tmax>6.0s) volume: 94m18mismatch Volume: 94mL37mfarction Location: No core infarct by standard RAPID criteria. Penumbra anterolaterally in the right parietal lobe. IMPRESSION: 1. Motion degraded examination with limited assessment of the small and medium-sized intracranial arteries. 2. Suspected mid right M2 branch vessel occlusion with 9 mL penumbra in the right parietal lobe and no core infarct based on CTP. 3. No more proximal large vessel occlusion. 4. Patent cervical carotid arteries without evidence of significant stenosis. 5. Patent vertebral  arteries with moderate to severe bilateral origin stenoses. 6. Possible severe left MCA origin stenosis. 7.  Aortic Atherosclerosis (ICD10-I70.0). These results were called by telephone at the time of interpretation on 12/19/2018 at 12:29 pm to Dr. Kerney Elbe , who verbally acknowledged these results. Electronically Signed   By: Logan Bores M.D.   On: 12/23/2018 12:59   Ct Angio Neck W Or Wo Contrast  Result Date: 01/03/2019 CLINICAL DATA:  Left-sided weakness, facial droop, and slurred speech. EXAM: CT ANGIOGRAPHY HEAD AND NECK CT PERFUSION BRAIN TECHNIQUE: Multidetector CT imaging of the head and neck was performed using the standard protocol during bolus administration of intravenous contrast. Multiplanar CT image reconstructions and MIPs were obtained to evaluate the vascular anatomy. Carotid stenosis measurements (when applicable) are obtained utilizing NASCET criteria, using the distal internal carotid diameter as the denominator. Multiphase CT imaging of the brain was performed following IV bolus contrast injection. Subsequent parametric perfusion maps were calculated using RAPID software. CONTRAST:  170m OMNIPAQUE IOHEXOL 350 MG/ML SOLN COMPARISON:  None. FINDINGS: CTA NECK FINDINGS The study is motion degraded including moderate to severe motion  artifact through the larynx which limits assessment of the carotid bifurcations. Aortic arch: Standard 3 vessel aortic arch with mild atherosclerotic plaque. No significant arch vessel origin stenosis. Right carotid system: Patent with moderate calcified atherosclerosis about the carotid bifurcation. No evidence of dissection or significant stenosis within limitations of motion artifact. Left carotid system: Patent with mild calcified atherosclerosis about the carotid bifurcation without evidence of significant stenosis or dissection. Vertebral arteries: Patent with the left being mildly dominant. Calcified atherosclerosis at the vertebral artery origins results in moderate to severe stenosis bilaterally. Skeleton: Mild cervical disc degeneration. 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 Upper chest: Clear lung apices. Review of the MIP images confirms the above findings CTA HEAD FINDINGS Anterior circulation: The internal carotid arteries are patent from skull base to carotid termini with calcified atherosclerosis resulting in mild cavernous stenosis on the right. The right M1 segment is widely patent. Branch vessel assessment is limited by motion artifact, however there is a suspected mid right M2 branch vessel occlusion. The left MCA appears duplicated with suggestion of severe stenosis of the more superior origin although motion artifact may be contributing to this appearance. ACAs are patent without evidence of flow limiting proximal stenosis. No aneurysm is identified. Posterior circulation: The intracranial vertebral arteries are patent to the basilar without evidence of significant stenosis. P ICAs and a ICAs are not well evaluated. SCA is are patent. The basilar artery is widely patent. There is a small right posterior communicating artery. PCAs are patent without evidence of flow limiting proximal stenosis allowing for suspected artifactual narrowing in the proximal right P2  segment. No aneurysm is identified. Venous sinuses: Patent. Anatomic variants: None. Review of the MIP images confirms the above findings CT Brain Perfusion Findings: ASPECTS: 10 CBF (<30%) Volume: 078mPerfusion (Tmax>6.0s) volume: 52m952mismatch Volume: 52mL352mfarction Location: No core infarct by standard RAPID criteria. Penumbra anterolaterally in the right parietal lobe. IMPRESSION: 1. Motion degraded examination with limited assessment of the small and medium-sized intracranial arteries. 2. Suspected mid right M2 branch vessel occlusion with 9 mL penumbra in the right parietal lobe and no core infarct based on CTP. 3. No more proximal large vessel occlusion. 4. Patent cervical carotid arteries without evidence of significant stenosis. 5. 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 6. Possible severe left MCA origin  stenosis. 7.  Aortic Atherosclerosis (ICD10-I70.0). These results were called by telephone at the time of interpretation on 12/17/2018 at 12:29 pm to Dr. Kerney Elbe , who verbally acknowledged these results. Electronically Signed   By: Logan Bores M.D.   On: 12/17/2018 12:59   Ct Cerebral Perfusion W Contrast  Result Date: 12/16/2018 CLINICAL DATA:  Left-sided weakness, facial droop, and slurred speech. EXAM: CT ANGIOGRAPHY HEAD AND NECK CT PERFUSION BRAIN TECHNIQUE: Multidetector CT imaging of the head and neck was performed using the standard protocol during bolus administration of intravenous contrast. Multiplanar CT image reconstructions and MIPs were obtained to evaluate the vascular anatomy. Carotid stenosis measurements (when applicable) are obtained utilizing NASCET criteria, using the distal internal carotid diameter as the denominator. Multiphase CT imaging of the brain was performed following IV bolus contrast injection. Subsequent parametric perfusion maps were calculated using RAPID software. CONTRAST:  111m OMNIPAQUE IOHEXOL 350 MG/ML SOLN  COMPARISON:  None. FINDINGS: CTA NECK FINDINGS The study is motion degraded including moderate to severe motion artifact through the larynx which limits assessment of the carotid bifurcations. Aortic arch: Standard 3 vessel aortic arch with mild atherosclerotic plaque. No significant arch vessel origin stenosis. Right carotid system: Patent with moderate calcified atherosclerosis about the carotid bifurcation. No evidence of dissection or significant stenosis within limitations of motion artifact. Left carotid system: Patent with mild calcified atherosclerosis about the carotid bifurcation without evidence of significant stenosis or dissection. Vertebral arteries: Patent with the left being mildly dominant. Calcified atherosclerosis at the vertebral artery origins results in moderate to severe stenosis bilaterally. Skeleton: Mild cervical disc degeneration. 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 Upper chest: Clear lung apices. Review of the MIP images confirms the above findings CTA HEAD FINDINGS Anterior circulation: The internal carotid arteries are patent from skull base to carotid termini with calcified atherosclerosis resulting in mild cavernous stenosis on the right. The right M1 segment is widely patent. Branch vessel assessment is limited by motion artifact, however there is a suspected mid right M2 branch vessel occlusion. The left MCA appears duplicated with suggestion of severe stenosis of the more superior origin although motion artifact may be contributing to this appearance. ACAs are patent without evidence of flow limiting proximal stenosis. No aneurysm is identified. Posterior circulation: The intracranial vertebral arteries are patent to the basilar without evidence of significant stenosis. P ICAs and a ICAs are not well evaluated. SCA is are patent. The basilar artery is widely patent. There is a small right posterior communicating artery. PCAs are patent without  evidence of flow limiting proximal stenosis allowing for suspected artifactual narrowing in the proximal right P2 segment. No aneurysm is identified. Venous sinuses: Patent. Anatomic variants: None. Review of the MIP images confirms the above findings CT Brain Perfusion Findings: ASPECTS: 10 CBF (<30%) Volume: 037mPerfusion (Tmax>6.0s) volume: 53m72mismatch Volume: 53mL53mfarction Location: No core infarct by standard RAPID criteria. Penumbra anterolaterally in the right parietal lobe. IMPRESSION: 1. Motion degraded examination with limited assessment of the small and medium-sized intracranial arteries. 2. Suspected mid right M2 branch vessel occlusion with 9 mL penumbra in the right parietal lobe and no core infarct based on CTP. 3. No more proximal large vessel occlusion. 4. Patent cervical carotid arteries without evidence of significant stenosis. 5. 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 6. Possible severe left MCA origin stenosis. 7.  Aortic Atherosclerosis (ICD10-I70.0). These results were called by telephone at the time of  interpretation on 01/07/2019 at 12:29 pm to Dr. Kerney Elbe , who verbally acknowledged these results. Electronically Signed   By: Logan Bores M.D.   On: 12/18/2018 12:59   Ct Head Code Stroke Wo Contrast  Result Date: 01/05/2019 CLINICAL DATA:  Code stroke. Left-sided weakness, facial droop, and slurred speech. EXAM: CT HEAD WITHOUT CONTRAST TECHNIQUE: Contiguous axial images were obtained from the base of the skull through the vertex without intravenous contrast. COMPARISON:  Head CT 09/17/2013 FINDINGS: Brain: There is no evidence of acute infarct, intracranial hemorrhage, mass, midline shift, or extra-axial fluid collection. Patchy cerebral white matter hypodensities are similar to the prior CT and nonspecific but compatible with moderately age advanced chronic small vessel ischemic disease. Mild prominence of the lateral ventricles is also  unchanged and may reflect central predominant cerebral atrophy. Vascular: Calcified atherosclerosis at the skull base. No hyperdense vessel. Skull: No fracture or focal osseous lesion. Sinuses/Orbits: Mild mucosal thickening in the maxillary sinuses. Clear mastoid air cells. Left cataract extraction. Other: None. ASPECTS [facility] Stroke Program Early CT Score) - Ganglionic level infarction (caudate, lentiform nuclei, internal capsule, insula, M1-M3 cortex): 7 - Supraganglionic infarction (M4-M6 cortex): 3 Total score (0-10 with 10 being normal): 10 IMPRESSION: 1. No evidence of acute intracranial abnormality. 2. ASPECTS is 10. 3. Moderate chronic small vessel ischemic disease. These results were communicated to Dr. Cheral Marker at 12:09 pm on 12/11/2018 by text page via the [employer] messaging system. Electronically Signed   By: Logan Bores M.D.   On: 01/10/2019 12:37    Procedures .Critical Care Performed by: Noemi Chapel, MD Authorized by: Noemi Chapel, MD   Critical care provider statement:    Critical care time (minutes):  35   Critical care time was exclusive of:  Separately billable procedures and treating other patients and teaching time   Critical care was necessary to treat or prevent imminent or life-threatening deterioration of the following conditions:  CNS failure or compromise   Critical care was time spent personally by me on the following activities:  Blood draw for specimens, development of treatment plan with patient or surrogate, discussions with consultants, evaluation of patient's response to treatment, examination of patient, obtaining history from patient or surrogate, ordering and performing treatments and interventions, ordering and review of laboratory studies, ordering and review of radiographic studies, pulse oximetry, re-evaluation of patient's condition and review of old charts Comments:       Procedure Name: Intubation Date/Time: 12/16/2018 1:12 PM Performed by: Noemi Chapel, MD Pre-anesthesia Checklist: Patient identified, Patient being monitored, Emergency Drugs available, Timeout performed and Suction available Oxygen Delivery Method: Non-rebreather mask Preoxygenation: Pre-oxygenation with 100% oxygen Induction Type: Rapid sequence Ventilation: Mask ventilation without difficulty Laryngoscope Size: Mac and 4 Grade View: Grade III Tube size: 8.0 mm Number of attempts: 1 Intubation method: direct laryngoscopy. Placement Confirmation: ETT inserted through vocal cords under direct vision,  CO2 detector and Breath sounds checked- equal and bilateral Secured at: 24 cm Tube secured with: ETT holder Dental Injury: Teeth and Oropharynx as per pre-operative assessment  Difficulty Due To: Difficulty was unanticipated Comments:        (including critical care time)  Medications Ordered in ED Medications  sodium chloride flush (NS) 0.9 % injection 3 mL (has no administration in time range)  0.9 %  sodium chloride infusion (has no administration in time range)     Initial Impression / Assessment and Plan / ED Course  I have reviewed the triage vital signs and the nursing  notes.  Pertinent labs & imaging results that were available during my care of the patient were reviewed by me and considered in my medical decision making (see chart for details).  Clinical Course as of Dec 19 1309  Sun Dec 20, 2018  1158 The patient was activated as a code stroke secondary to his left-sided facial droop, inability to look to the left and visual extinction on the left, he also was activated as a code sepsis secondary to fever tachycardia and cough.  The delay for his sepsis work-up is because of the CT scans that were necessary for the more significant evaluation of his potential stroke.  The decision will be made on antibiotic shortly.  He does not appear to be hypoxic or in any respiratory distress.   [BM]    Clinical Course User Index [BM] Noemi Chapel, MD       The patient has atrial fibrillation, he is tachycardic, he is febrile, he also has a history of not being anticoagulated and is now having focal unilateral deficits consistent with having a stroke.  Code stroke was activated, the patient was sent to the CT scanner.  Neurology is at the bedside on arrival with me for initial evaluation.  The patient has a worsening deficit with left visual fields and I have been asked by neurology to intubate the patient prior to going to interventional radiology.  There are findings on the CT scan which suggest an occlusion that is amenable to interventional radiology evaluation.  The intubation went well.  The patient is sedated with propofol and Versed, he is persistently tachycardic and febrile though there is not a definite source that we have at this time.  His white blood cell count is 20,000, we do not have an answer for that.  The patient will need to have further evaluation as time goes on however antibiotics were ordered to expedite the treatment of a potential underlying infection, this was done in consultation with neurology who wants to take the patient immediately to interventional radiology.  Final Clinical Impressions(s) / ED Diagnoses   Final diagnoses:  Acute ischemic stroke ([place])  Fever, unspecified fever cause      Noemi Chapel, MD 12/19/2018 1316

## 2018-12-20 NOTE — Consult Note (Addendum)
NAME:  Thomas Adams, MRN:  [id number], DOB:  [birth year], LOS: 0 ADMISSION DATE:  01/09/2019, CONSULTATION DATE:  01/02/2019 REFERRING MD:  Dr. Germaine Pomfret , CHIEF COMPLAINT:  Left side weakness   Brief History   58 yr old with history of HTN, afib, febrile and coughing last night, found with left facial droop, left sided weakness this morning. 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 Brought to ICU intubated  History of present illness   58 year old right-handed gentleman with a past medical history of hypertension, atrial fibrillation with RVR, end-stage renal disease currently undergoing hemodialysis Tuesday Thursday Saturday via left arm AV fistula, reportedly began feeling sick night prior to his presentation.  He was found in the morning to have left-sided facial droop, visual field deficits, and left-sided weakness.  He was brought to the emergency room as a code stroke.  In the emergency room, by report, he was having some difficulty following commands.  Does however appear that he was able to protect his airway.  He was scheduled to go to interventional radiology, and intubated in the emergency room in preparation for procedures.  Notes from the interventional radiology team indicate right MCA branch occlusion, not amenable to thrombectomy. He was not a tPA candidate because of time constraints.  He was also noted to be febrile in the emergency room, with a WBC approximately 20.  Chest x-ray is unremarkable.  COVID screen was negative.  Blood cultures have been drawn, broad-spectrum antibiotics initiated.  Past Medical History  HTN ESRD, dialysis TThSat Afib w/ RVR  Significant Hospital Events   Angiogram in IR 5/10  Consults:  Nephrology 5/10 PCCM 5/10  Procedures:  5/10-ETT 5/10-angio 5/10-a-line   Significant Diagnostic Tests:  TTE-pending Carotid duplex-pending  Micro Data:  COVID screen negative Blood cx  pending  Antimicrobials:  5/10- Vancomycin,  cefipime    Objective   Blood pressure 109/78, pulse (!) 106, temperature 100.2 F (37.9 C), temperature source Oral, resp. rate (!) 21, height 5\' 9"  (1.753 m), weight 85.3 kg, SpO2 100 %.    Vent Mode: PRVC FiO2 (%):  [50 %-100 %] 50 % Set Rate:  [16 bmp] 16 bmp Vt Set:  [560 mL] 560 mL PEEP:  [5 cmH20] 5 cmH20 Plateau Pressure:  [17 cmH20-18 cmH20] 18 cmH20   Intake/Output Summary (Last 24 hours) at 12/13/2018 1544 Last data filed at 12/24/2018 1138 Gross per 24 hour  Intake 10 ml  Output -  Net 10 ml   Filed Weights   12/14/2018 1100 01/09/2019 1138 12/13/2018 1500  Weight: 87.2 kg 87.2 kg 85.3 kg    Examination: General: intubated, sedated HENT: AT/[place] Lungs: CTA Cardiovascular: tachycardic in mid 100s, sinus, 4/6 blowing systolic murmer loudest at [facility] space, most consistent with mitral regurg.  Abdomen: SND3 Extremities: R arm IV, R arm a-line, trace edema, L forearm AV fistula with good thrill Neuro: modestly responds to requests. Able to move right toes, right hand. L side less. GU: bladder scan 0  Resolved Hospital Problem list     Assessment & Plan:  58 year old with right MCA branch occlusion stroke, amenable to thrombectomy or TPA.  Superimposed picture of developing infection, with blood cultures pending, broad-spectrum antibiotics initiated.  Intubated for procedures.  Right MCA branch CVA: Being managed by neurology  Intubation/ventilator management: Patient intubated for procedures, will wean and trial for extubation.  X-ray was normal.  Lung field sounds are clear.  Low risk  of pneumonia being etiology for his infectious process  Infectious disease: Plan-follow blood cultures, continue broad-spectrum antibiotics for the time being.  ESRD: Nephrology consulted; discussion with nephrologist on duty is that they would likely wait to dialyze until stable from other perspectives, and can get notes from his  outpatient dialysis unit.  CV: With a history of hypertension, and atrial fibrillation with RVR.  Currently in sinus tachycardia without any evidence of atrial fibrillation.   --blood pressure management with cleviprex, any BP elevation related to agitation with precedex --telemetry for any arrhythmia,  --a-line to continue for now  --consider cardiology involvement to assess ?new onset significant mitral regurg.   Best practice:  Diet: NPO Pain/Anxiety/Delirium protocol (if indicated): in place VAP protocol (if indicated): HOB >30, PPI, will wean to extubate as quickly as possible DVT prophylaxis: SCD for now; no anticoag for 24 hours after angio. recc's for ASA 5/11 GI prophylaxis: PPI Glucose control: monitor Mobility: bed for now; PT, OT eval after extubation Code Status: full Family Communication: none at bedside Disposition:   Labs   CBC: Recent Labs  Lab 12/13/2018 1139  WBC 20.2*  NEUTROABS 17.7*  HGB 11.0*  HCT 31.6*  MCV 92.1  PLT 75*    Basic Metabolic Panel: Recent Labs  Lab 01/06/2019 1139 01/01/2019 1146  NA 128*  --   K 4.5  --   CL 80*  --   CO2 25  --   GLUCOSE 101*  --   BUN 50*  --   CREATININE 12.49* 12.50*  CALCIUM 9.3  --    GFR: Estimated Creatinine Clearance: 7.2 mL/min (A) (by C-G formula based on SCr of 12.5 mg/dL (H)). Recent Labs  Lab 12/22/2018 1139 01/08/2019 1258 12/11/2018 1513  WBC 20.2*  --   --   LATICACIDVEN  --  3.3* 1.5    Liver Function Tests: Recent Labs  Lab 12/21/2018 1139  AST 53*  ALT 21  ALKPHOS 102  BILITOT 1.3*  PROT 6.8  ALBUMIN 3.3*   No results for input(s): LIPASE, AMYLASE in the last 168 hours. No results for input(s): AMMONIA in the last 168 hours.  ABG    Component Value Date/Time   PHART 7.487 (H) 12/11/2018 1505   PCO2ART 34.8 12/30/2018 1505   PO2ART 154 (H) 12/21/2018 1505   HCO3 25.8 01/01/2019 1505   TCO2 22 03/04/2016 1206   O2SAT 99.1 12/29/2018 1505     Coagulation Profile: Recent Labs   Lab 01/08/2019 1258  INR 1.7*    Cardiac Enzymes: No results for input(s): CKTOTAL, CKMB, CKMBINDEX, TROPONINI in the last 168 hours.  HbA1C: No results found for: HGBA1C  CBG: Recent Labs  Lab 12/19/2018 1142  GLUCAP 105*    Review of Systems:   Unable to obtain secondary to intubation  Past Medical History  He,  has a past medical history of CKD (chronic kidney disease), stage III ([place]), Complication of anesthesia, Hypertension, and Renal insufficiency.   Surgical History    Past Surgical History:  Procedure Laterality Date  . AV FISTULA PLACEMENT Left 09/22/2013   Procedure: ARTERIOVENOUS (AV) FISTULA CREATION RADIOCEPHALIC;  Surgeon: Elam Dutch, MD;  Location: [facility];  Service: Vascular;  Laterality: Left;  . DIALYSIS FISTULA CREATION    . INSERTION OF DIALYSIS CATHETER Right 09/22/2013   Procedure: INSERTION OF DIALYSIS CATHETER; ULTRASOUND GUIDED;  Surgeon: Elam Dutch, MD;  Location: [facility];  Service: Vascular;  Laterality: Right;  . IR GENERIC HISTORICAL  12/14/2015  IR RADIOLOGIST EVAL & MGMT 12/14/2015 Aletta Edouard, MD [facility] INTERV RAD  . REMOVAL OF A DIALYSIS CATHETER Right 09/22/2013   Procedure: REMOVAL OF A DIALYSIS CATHETER OF TEMPORARY RIGHT INTERNAL JUGULAR ;  Surgeon: Elam Dutch, MD;  Location: [facility];  Service: Vascular;  Laterality: Right;     Social History   reports that he has never smoked. He has never used smokeless tobacco. He reports that he does not drink alcohol or use drugs.   Family History   His family history includes Hypertension in his father.   Allergies Allergies  Allergen Reactions  . Penicillins Nausea Only, Rash and Other (See Comments)    also dizziness     Home Medications  Prior to Admission medications   Medication Sig Start Date End Date Taking? Authorizing Provider  amLODipine (NORVASC) 10 MG tablet Take 1 tablet (10 mg total) by mouth daily. 03/05/16   Burns, Arloa Koh, MD  hydrALAZINE (APRESOLINE) 25 MG  tablet Take 1 tablet (25 mg total) by mouth every 8 (eight) hours. 09/23/13   Delfina Redwood, MD  labetalol (NORMODYNE) 200 MG tablet Take 1 tablet (200 mg total) by mouth 2 (two) times daily. Patient taking differently: Take 100 mg by mouth 2 (two) times daily.  09/23/13   Delfina Redwood, MD  lisinopril (PRINIVIL,ZESTRIL) 20 MG tablet Take 1 tablet by mouth daily. 12/18/15   [provider]  sevelamer carbonate (RENVELA) 800 MG tablet Take 2,400 mg by mouth 3 (three) times daily.    [provider]     Critical care time:  I have independently seen and examined the patient, reviewed data, and developed an assessment and plan. A total of 39 minutes were spent in critical care assessment and medical decision making. This critical care time does not reflect procedure time, or teaching time or supervisory time of PA/NP/Med student/Med Resident, etc but could involve care discussion time.  Bonna Gains, MD PhD 01/01/2019 4:23 PM

## 2018-12-20 NOTE — ED Triage Notes (Signed)
Patient arrived via [facility] at [phone] with left sided weakness, left facial droop, and dysarthria. LKN 2000 last night when patient went to bed. At some point, patient did get up last night, but he did not recall that time. Family noted that patient had weakness, per family, patient sustained a fall. + Temp 100.3, + SOB, + cough, +nausea, and has history of AFIB but not on ATC. NIH 8.   CTH/CTA/CTP done, scans were delayed because patient was shaking with chills and rigor. Ativan 2mg  IV and Fent 25 mcg were given, scans complete, and patient was taken back to Trauma A. Rectal temp of 103.9, 1252 - decision for IR, also now has LT Visual Field cut. 1253 - decision for intubation in ED. RSI done at 1304, OG and catheter placed, CXR ordered. Propofol initiated and Versed 2 mg IV given as well.   Sister Abram Sax [phone] and [phone]

## 2018-12-20 NOTE — Progress Notes (Signed)
  Echocardiogram 2D Echocardiogram has been performed.  Thomas Adams 12/11/2018, 4:17 PM

## 2018-12-20 NOTE — Anesthesia Preprocedure Evaluation (Addendum)
Anesthesia Evaluation  Patient identified by MRN, date of birth, ID band Patient unresponsive    Reviewed: Unable to perform ROS - Chart review onlyPreop documentation limited or incomplete due to emergent nature of procedure.  History of Anesthesia Complications (+) PROLONGED EMERGENCE and history of anesthetic complications  Airway        Dental   Pulmonary           Cardiovascular hypertension, + dysrhythmias Atrial Fibrillation      Neuro/Psych CVA, Residual Symptoms    GI/Hepatic negative GI ROS, Neg liver ROS,   Endo/Other    Renal/GU ESRF and DialysisRenal diseaseCr 12, HD fri     Musculoskeletal negative musculoskeletal ROS (+)   Abdominal   Peds  Hematology  (+) anemia ,   Anesthesia Other Findings 2/20 tte:  1. The left ventricle has normal systolic function with an ejection fraction of 60-65%. The cavity size was normal. There is moderately increased left ventricular wall thickness. Left ventricular diastolic Doppler parameters are consistent with impaired  relaxation.  2. The right ventricle has normal systolic function. The cavity was normal. There is no increase in right ventricular wall thickness.  3. Left atrial size was moderately dilated.  4. The mitral valve is normal in structure.  5. The tricuspid valve is normal in structure.  6. The aortic valve is tricuspid There is mild stenosis of the aortic valve.  7. The pulmonic valve was normal in structure.  8. There is mild dilatation of the ascending aorta measuring 37 mm.  9. Right atrial pressure is estimated at 3 mmHg.  Reproductive/Obstetrics                            Anesthesia Physical Anesthesia Plan  ASA: IV and emergent  Anesthesia Plan: General   Post-op Pain Management:    Induction: Intravenous, Rapid sequence and Cricoid pressure planned  PONV Risk Score and Plan: 2 and Treatment may vary due to age or  medical condition  Airway Management Planned: Oral ETT  Additional Equipment: Arterial line  Intra-op Plan:   Post-operative Plan: Post-operative intubation/ventilation  Informed Consent:     History available from chart only and Only emergency history available  Plan Discussed with: CRNA and Surgeon  Anesthesia Plan Comments:         Anesthesia Quick Evaluation

## 2018-12-20 NOTE — Transfer of Care (Signed)
Immediate Anesthesia Transfer of Care Note  Patient: Thomas Adams  Procedure(s) Performed: RADIOLOGY WITH ANESTHESIA (N/A )  Patient Location: ICU  Anesthesia Type:General  Level of Consciousness: sedated and Patient remains intubated per anesthesia plan  Airway & Oxygen Therapy: Patient remains intubated per anesthesia plan and Patient placed on Ventilator (see vital sign flow sheet for setting)  Post-op Assessment: Report given to RN and Post -op Vital signs reviewed and stable  Post vital signs: Reviewed and stable  Last Vitals:  Vitals Value Taken Time  BP 110/76 01/10/2019  2:56 PM  Temp    Pulse 105 12/14/2018  2:57 PM  Resp 7 01/03/2019  2:56 PM  SpO2 100 % 01/10/2019  2:57 PM  Vitals shown include unvalidated device data.  Last Pain:  Vitals:   12/21/2018 1238  TempSrc:   PainSc: 0-No pain         Complications: No apparent anesthesia complications   Transported on vent. Tolerated transport well.

## 2018-12-20 NOTE — Progress Notes (Signed)
CAlled to evaluate hypotension at bedside  Patient was on cleviprex following strok and then precedex added replacing diprivan Then sbp dropped to 80s Then both infusion stopped but patinent still hypotensive though arousable and sbp rises  Plan = 1L NS bolus as given by Dr Cheral Marker  - change precedex to diprivan for sedation  -use neo if needed   - hold off extubation for now - sbp 120-140 as outlined by neuro     SIGNATURE    Dr. Brand Males, M.D., F.C.C.P,  Pulmonary and Critical Care Medicine Staff Physician, [place] Director - Interstitial Lung Disease  Program  [facility] [place], [place], [address]  Pager: [phone], If no answer or between  15:00h - 7:00h: call [phone] Telephone: [phone]  7:07 PM 01/09/2019

## 2018-12-21 ENCOUNTER — Inpatient Hospital Stay (HOSPITAL_COMMUNITY): Payer: Medicare Other

## 2018-12-21 ENCOUNTER — Encounter (HOSPITAL_COMMUNITY): Payer: Self-pay | Admitting: Interventional Radiology

## 2018-12-21 DIAGNOSIS — R6521 Severe sepsis with septic shock: Secondary | ICD-10-CM

## 2018-12-21 DIAGNOSIS — B9561 Methicillin susceptible Staphylococcus aureus infection as the cause of diseases classified elsewhere: Secondary | ICD-10-CM

## 2018-12-21 DIAGNOSIS — J988 Other specified respiratory disorders: Secondary | ICD-10-CM

## 2018-12-21 DIAGNOSIS — I4891 Unspecified atrial fibrillation: Secondary | ICD-10-CM

## 2018-12-21 DIAGNOSIS — A419 Sepsis, unspecified organism: Secondary | ICD-10-CM

## 2018-12-21 DIAGNOSIS — Z992 Dependence on renal dialysis: Secondary | ICD-10-CM

## 2018-12-21 DIAGNOSIS — G9340 Encephalopathy, unspecified: Secondary | ICD-10-CM

## 2018-12-21 DIAGNOSIS — R7881 Bacteremia: Secondary | ICD-10-CM

## 2018-12-21 DIAGNOSIS — I69354 Hemiplegia and hemiparesis following cerebral infarction affecting left non-dominant side: Secondary | ICD-10-CM

## 2018-12-21 DIAGNOSIS — I120 Hypertensive chronic kidney disease with stage 5 chronic kidney disease or end stage renal disease: Secondary | ICD-10-CM

## 2018-12-21 DIAGNOSIS — I69392 Facial weakness following cerebral infarction: Secondary | ICD-10-CM

## 2018-12-21 DIAGNOSIS — R011 Cardiac murmur, unspecified: Secondary | ICD-10-CM

## 2018-12-21 DIAGNOSIS — Z880 Allergy status to penicillin: Secondary | ICD-10-CM

## 2018-12-21 DIAGNOSIS — I340 Nonrheumatic mitral (valve) insufficiency: Secondary | ICD-10-CM

## 2018-12-21 DIAGNOSIS — Z9911 Dependence on respirator [ventilator] status: Secondary | ICD-10-CM

## 2018-12-21 DIAGNOSIS — N186 End stage renal disease: Secondary | ICD-10-CM

## 2018-12-21 LAB — BLOOD CULTURE ID PANEL (REFLEXED)

## 2018-12-21 LAB — LIPID PANEL
Cholesterol: 155 mg/dL (ref 0–200)
HDL: <10 mg/dL — ABNORMAL LOW (ref >40)
LDL Cholesterol: UNDETERMINED mg/dL (ref 0–99)
Triglycerides: 691 mg/dL — ABNORMAL HIGH (ref <150)
VLDL: UNDETERMINED mg/dL (ref 0–40)

## 2018-12-21 LAB — URINALYSIS, MICROSCOPIC (REFLEX)

## 2018-12-21 LAB — BASIC METABOLIC PANEL
Anion gap: 17 — ABNORMAL HIGH (ref 5–15)
BUN: 74 mg/dL — ABNORMAL HIGH (ref 6–20)
CO2: 19 mmol/L — ABNORMAL LOW (ref 22–32)
Calcium: 8 mg/dL — ABNORMAL LOW (ref 8.9–10.3)
Chloride: 92 mmol/L — ABNORMAL LOW (ref 98–111)
Creatinine, Ser: 12.93 mg/dL — ABNORMAL HIGH (ref 0.61–1.24)
GFR calc Af Amer: 4 mL/min — ABNORMAL LOW (ref >60)
GFR calc non Af Amer: 4 mL/min — ABNORMAL LOW (ref >60)
Glucose, Bld: 79 mg/dL (ref 70–99)
Potassium: 4.9 mmol/L (ref 3.5–5.1)
Sodium: 128 mmol/L — ABNORMAL LOW (ref 135–145)

## 2018-12-21 LAB — URINALYSIS, ROUTINE W REFLEX MICROSCOPIC
Bilirubin Urine: NEGATIVE
Glucose, UA: NEGATIVE mg/dL
Ketones, ur: NEGATIVE mg/dL
Nitrite: NEGATIVE
Protein, ur: 100 mg/dL — AB
Specific Gravity, Urine: 1.01 (ref 1.005–1.030)
pH: 8 (ref 5.0–8.0)

## 2018-12-21 LAB — MAGNESIUM: Magnesium: 1.9 mg/dL (ref 1.7–2.4)

## 2018-12-21 LAB — HIV ANTIBODY (ROUTINE TESTING W REFLEX): HIV Screen 4th Generation wRfx: NONREACTIVE

## 2018-12-21 LAB — HEMOGLOBIN A1C
Hgb A1c MFr Bld: 5.5 % (ref 4.8–5.6)
Mean Plasma Glucose: 111.15 mg/dL

## 2018-12-21 LAB — LDL CHOLESTEROL, DIRECT: Direct LDL: 18.6 mg/dL (ref 0–99)

## 2018-12-21 MED ORDER — SODIUM CHLORIDE 0.9 % IV SOLN
1000.0000 mL | INTRAVENOUS | Status: DC
  Administered 2018-12-21: 500 mL via INTRAVENOUS
  Administered 2018-12-22: 1000 mL via INTRAVENOUS

## 2018-12-21 MED ORDER — AMIODARONE HCL IN DEXTROSE 360-4.14 MG/200ML-% IV SOLN
60.0000 mg/h | INTRAVENOUS | Status: DC
  Administered 2018-12-22: 60 mg/h via INTRAVENOUS
  Filled 2018-12-21: qty 200

## 2018-12-21 MED ORDER — MIDAZOLAM HCL 2 MG/2ML IJ SOLN
1.0000 mg | INTRAMUSCULAR | Status: DC | PRN
  Administered 2018-12-21 (×2): 2 mg via INTRAVENOUS
  Filled 2018-12-21 (×2): qty 2

## 2018-12-21 MED ORDER — AMIODARONE LOAD VIA INFUSION
150.0000 mg | Freq: Once | INTRAVENOUS | Status: AC
  Administered 2018-12-21: 150 mg via INTRAVENOUS
  Filled 2018-12-21: qty 83.34

## 2018-12-21 MED ORDER — CHLORHEXIDINE GLUCONATE 0.12 % MT SOLN
OROMUCOSAL | Status: AC
  Administered 2018-12-21: 15 mL via OROMUCOSAL
  Filled 2018-12-21: qty 15

## 2018-12-21 MED ORDER — DEXMEDETOMIDINE HCL IN NACL 200 MCG/50ML IV SOLN
0.0000 ug/kg/h | INTRAVENOUS | Status: DC
  Administered 2018-12-21: 0.4 ug/kg/h via INTRAVENOUS
  Filled 2018-12-21: qty 50

## 2018-12-21 MED ORDER — CEFAZOLIN SODIUM-DEXTROSE 2-4 GM/100ML-% IV SOLN
2.0000 g | INTRAVENOUS | Status: DC
  Filled 2018-12-21: qty 100

## 2018-12-21 MED ORDER — NOREPINEPHRINE 4 MG/250ML-% IV SOLN
0.0000 ug/min | INTRAVENOUS | Status: DC
  Administered 2018-12-21: 5 ug/min via INTRAVENOUS
  Administered 2018-12-21: 19:00:00 4 ug/min via INTRAVENOUS
  Administered 2018-12-22: 5 ug/min via INTRAVENOUS
  Administered 2018-12-23: 10 ug/min via INTRAVENOUS
  Administered 2018-12-24: 4 ug/min via INTRAVENOUS
  Administered 2018-12-24: 11 ug/min via INTRAVENOUS
  Administered 2018-12-25: 18:00:00 5 ug/min via INTRAVENOUS
  Administered 2018-12-26: 13:00:00 11 ug/min via INTRAVENOUS
  Administered 2018-12-26: 23:00:00 5 ug/min via INTRAVENOUS
  Administered 2018-12-26: 07:00:00 14 ug/min via INTRAVENOUS
  Administered 2018-12-27: 6 ug/min via INTRAVENOUS
  Administered 2018-12-28: 3 ug/min via INTRAVENOUS
  Administered 2018-12-29: 7 ug/min via INTRAVENOUS
  Administered 2018-12-29: 5 ug/min via INTRAVENOUS
  Administered 2018-12-30: 15 ug/min via INTRAVENOUS
  Administered 2018-12-30: 16 ug/min via INTRAVENOUS
  Administered 2018-12-30: 11 ug/min via INTRAVENOUS
  Administered 2018-12-30: 10 ug/min via INTRAVENOUS
  Administered 2018-12-30: 12 ug/min via INTRAVENOUS
  Administered 2018-12-31: 11 ug/min via INTRAVENOUS
  Administered 2018-12-31: 13 ug/min via INTRAVENOUS
  Filled 2018-12-21 (×18): qty 250

## 2018-12-21 MED ORDER — CEFAZOLIN SODIUM-DEXTROSE 2-4 GM/100ML-% IV SOLN
2.0000 g | Freq: Once | INTRAVENOUS | Status: AC
  Administered 2018-12-21: 04:00:00 2 g via INTRAVENOUS
  Filled 2018-12-21: qty 100

## 2018-12-21 MED ORDER — FENTANYL 2500MCG IN NS 250ML (10MCG/ML) PREMIX INFUSION
0.0000 ug/h | INTRAVENOUS | Status: DC
  Administered 2018-12-21: 50 ug/h via INTRAVENOUS
  Administered 2018-12-26: 01:00:00 25 ug/h via INTRAVENOUS
  Administered 2018-12-26: 100 ug/h via INTRAVENOUS
  Administered 2018-12-27: 200 ug/h via INTRAVENOUS
  Administered 2018-12-27: 250 ug/h via INTRAVENOUS
  Administered 2018-12-27: 200 ug/h via INTRAVENOUS
  Administered 2018-12-28: 400 ug/h via INTRAVENOUS
  Administered 2018-12-28: 325 ug/h via INTRAVENOUS
  Administered 2018-12-28: 125 ug/h via INTRAVENOUS
  Administered 2018-12-29: 300 ug/h via INTRAVENOUS
  Administered 2018-12-29 (×2): 275 ug/h via INTRAVENOUS
  Administered 2018-12-30: 350 ug/h via INTRAVENOUS
  Administered 2018-12-30: 275 ug/h via INTRAVENOUS
  Administered 2018-12-30: 325 ug/h via INTRAVENOUS
  Administered 2018-12-31: 350 ug/h via INTRAVENOUS
  Filled 2018-12-21 (×16): qty 250

## 2018-12-21 MED ORDER — HEPARIN SODIUM (PORCINE) 1000 UNIT/ML IJ SOLN
2.4000 mL | Freq: Once | INTRAMUSCULAR | Status: AC
  Administered 2018-12-21: 2400 [IU]
  Filled 2018-12-21: qty 3

## 2018-12-21 MED ORDER — AMIODARONE HCL IN DEXTROSE 360-4.14 MG/200ML-% IV SOLN
30.0000 mg/h | INTRAVENOUS | Status: DC
  Administered 2018-12-22 – 2018-12-23 (×2): 30 mg/h via INTRAVENOUS
  Filled 2018-12-21 (×4): qty 200

## 2018-12-21 MED ORDER — PANTOPRAZOLE SODIUM 40 MG PO PACK
40.0000 mg | PACK | ORAL | Status: DC
  Administered 2018-12-21 – 2018-12-31 (×11): 40 mg
  Filled 2018-12-21 (×11): qty 20

## 2018-12-21 MED ORDER — MIDAZOLAM BOLUS VIA INFUSION
1.0000 mg | INTRAVENOUS | Status: DC | PRN
  Filled 2018-12-21: qty 2

## 2018-12-21 MED ORDER — LACTATED RINGERS IV BOLUS
500.0000 mL | Freq: Once | INTRAVENOUS | Status: AC
  Administered 2018-12-21: 500 mL via INTRAVENOUS

## 2018-12-21 MED ORDER — MIDAZOLAM 50MG/50ML (1MG/ML) PREMIX INFUSION
0.0000 mg/h | INTRAVENOUS | Status: DC
  Administered 2018-12-21 (×2): 2 mg/h via INTRAVENOUS
  Administered 2018-12-22: 5 mg/h via INTRAVENOUS
  Administered 2018-12-22: 3 mg/h via INTRAVENOUS
  Filled 2018-12-21 (×4): qty 50

## 2018-12-21 MED ORDER — PANTOPRAZOLE SODIUM 40 MG IV SOLR: 40.0000 mg | Freq: Every day | INTRAVENOUS | Status: DC

## 2018-12-21 NOTE — Progress Notes (Signed)
Renal Navigator notified OP HD clinic of patient's admission and negative COVID 19 test result to provide continuity of care and safety.  Alphonzo Cruise Renal Navigator [phone]

## 2018-12-21 NOTE — Progress Notes (Signed)
eLink Physician-Brief Progress Note Patient Name: Thomas Adams DOB: 03/18/1961 MRN: [id number]   Date of Service  12/21/2018  HPI/Events of Note  AFIB with RVR - Ventricular rate = 114 - 140's. BP = 108/53 with MAP = 66 on a Norepinephrine IV infusion.   eICU Interventions  Will order: 1. Amiodarone IV load and infusion.  2. BMP and Mg++ level STAT.      Intervention Category Major Interventions: Arrhythmia - evaluation and management  [name],[name] 12/21/2018, 10:18 PM

## 2018-12-21 NOTE — Progress Notes (Signed)
[facility]    NEPHROLOGY PROGRESS NOTE  SUBJECTIVE: Intubated, sedated, unable to provide review of systems.  Overnight became hypotensive requiring pressors.  Blood cultures are positive for gram-positive cocci.   OBJECTIVE:  Vitals:   12/21/18 1200 12/21/18 1300  BP: 122/80 130/74  Pulse:    Resp: 14 14  Temp: 97.6 F (36.4 C)   SpO2: 100%     Intake/Output Summary (Last 24 hours) at 12/21/2018 1303 Last data filed at 12/21/2018 1200 Gross per 24 hour  Intake 4460.13 ml  Output -  Net 4460.13 ml      General: Intubated, sedated, no acute distress  HEENT: MMM [place] AT anicteric sclera Neck:  No JVD, no adenopathy CV:  Heart RRR  Lungs:  L/S CTA bilaterally Abd:  abd SNT/ND with normal BS GU:  Bladder non-palpable Extremities:  No LE edema, left forearm AV fistula with a suture in place, good thrill and bruit Skin:  No skin rash  MEDICATIONS:  . chlorhexidine gluconate (MEDLINE KIT)  15 mL Mouth Rinse BID  . mouth rinse  15 mL Mouth Rinse 10 times per day  . pantoprazole sodium  40 mg Per Tube Q24H  . sodium chloride flush  3 mL Intravenous Once     LABS:   CBC Latest Ref Rng & Units 01/10/2019 03/05/2016 03/04/2016  WBC 4.0 - 10.5 K/uL 20.2(H) 6.0 -  Hemoglobin 13.0 - 17.0 g/dL 11.0(L) 8.8(L) 7.8(L)  Hematocrit 39.0 - 52.0 % 31.6(L) 28.1(L) 23.0(L)  Platelets 150 - 400 K/uL 75(L) 144(L) -    CMP Latest Ref Rng & Units 01/02/2019 12/13/2018 03/05/2016  Glucose 70 - 99 mg/dL - 101(H) 83  BUN 6 - 20 mg/dL - 50(H) 18  Creatinine 0.61 - 1.24 mg/dL 12.50(H) 12.49(H) 6.73(H)  Sodium 135 - 145 mmol/L - 128(L) 137  Potassium 3.5 - 5.1 mmol/L - 4.5 4.0  Chloride 98 - 111 mmol/L - 80(L) 99(L)  CO2 22 - 32 mmol/L - 25 30  Calcium 8.9 - 10.3 mg/dL - 9.3 8.9  Total Protein 6.5 - 8.1 g/dL - 6.8 -  Total Bilirubin 0.3 - 1.2 mg/dL - 1.3(H) -  Alkaline Phos 38 - 126 U/L - 102 -  AST 15 - 41 U/L - 53(H) -  ALT 0 - 44 U/L - 21 -    Lab Results  Component Value  Date   PTH 1,127.3 (H) 09/18/2013   CALCIUM 9.3 01/06/2019   CAION 0.75 (L) 03/04/2016   PHOS 4.3 03/05/2016       Component Value Date/Time   COLORURINE YELLOW 09/18/2013 2013   APPEARANCEUR CLEAR 09/18/2013 2013   LABSPEC 1.010 09/18/2013 2013   PHURINE 6.5 09/18/2013 2013   GLUCOSEU NEGATIVE 09/18/2013 2013   HGBUR SMALL (A) 09/18/2013 2013   BILIRUBINUR NEGATIVE 09/18/2013 2013   KETONESUR NEGATIVE 09/18/2013 2013   PROTEINUR 100 (A) 09/18/2013 2013   UROBILINOGEN 0.2 09/18/2013 2013   NITRITE NEGATIVE 09/18/2013 2013   LEUKOCYTESUR NEGATIVE 09/18/2013 2013      Component Value Date/Time   PHART 7.462 (H) 01/04/2019 1653   PCO2ART 38.4 12/21/2018 1653   PO2ART 129 (H) 01/02/2019 1653   HCO3 26.8 12/14/2018 1653   TCO2 22 03/04/2016 1206   O2SAT 98.2 12/11/2018 1653       Component Value Date/Time   IRON 34 (L) 09/18/2013 0100   TIBC 230 09/18/2013 0100   FERRITIN 63 09/18/2013 0100   IRONPCTSAT 15 (L) 09/18/2013 0100  ASSESSMENT/PLAN:     The patient is a 58 y.o. year old male patient with a past medical history significant for atrial fibrillation, hypertension, end-stage renal disease on hemodialysis Tuesday, Thursday, and Saturday from [facility] who presented to the emergency department via EMS with acute onset of left hemiparesis and left facial droop with dysarthria.    1.  End-stage renal disease on hemodialysis.  We will continue his usual schedule on Tuesday, Thursday, Saturday.  No urgent indication for dialysis currently particularly with variable blood pressures.  Will likely need to start CRRT tomorrow.  Temporary dialysis catheter being placed.  2.  Acute ischemic stroke.  Unable to perform thrombectomy due to location.  Aspirin to be started tomorrow.  Continue supportive care.  Hold outpatient antihypertensives.  Suspect septic emboli.  3.  Secondary hyperparathyroidism.  Resume outpatient dosing of phosphate binders and  calcitriol.  4.  Anemia of chronic kidney disease.  Hemoglobin is above goal.  Would avoid ESA's in the setting of an acute stroke.  5.  Staphylococcal bacteremia.  Continue vancomycin.  Unclear source.  Left forearm AV fistula does not appear to be source of infection.  Infectious diseases following.  It does appear that that left forearm AV fistula had some potential recent intervention as a suture is still in place.  [place], DO, [place]

## 2018-12-21 NOTE — Progress Notes (Signed)
Pharmacy is unable to confirm the medications the patient was taking at home. All options have been exhausted and a resolution to the situation is not expected.   Where possible, their outpatient pharmacy(s) have been contacted for the last time prescriptions were filled and that information has been added to each medication in an Order Note (highlighted yellow below the medication).  Please contact pharmacy if further assistance is needed.   Romeo Rabon, PharmD. Mobile: [phone]. 12/21/2018,8:38 AM.

## 2018-12-21 NOTE — Progress Notes (Signed)
1045 patients BP labile. Hypotensive and hypertensive. NP at bedside. Sedation orders modified. Pressor orders modified.

## 2018-12-21 NOTE — Progress Notes (Signed)
NAME:  Thomas Adams, MRN:  [id number], DOB:  20-Sep-1960, LOS: 1 ADMISSION DATE:  01/07/2019, CONSULTATION DATE:  12/21/18 REFERRING MD:  Dr. Germaine Pomfret , CHIEF COMPLAINT:  Left side weakness   Brief History   58 yr old with history of HTN, afib, febrile and coughing last night, found with left facial droop, left sided weakness this morning. 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 Brought to ICU intubated  Past Medical History  HTN ESRD, dialysis TThSat Afib w/ RVR  Significant Hospital Events   5/10 Angiogram in IR >> no clot retraction due to distal location of thrombus; hypotension from meds 5/11 d/c diprivan due to triglyceride levels  Consults:  Nephrology   Procedures:  ETT 5/10 >>  Rt radial aline 5/10 >>   Significant Diagnostic Tests:  CT angio head/neck 5/10 >> Rt M2 occlusion, severe Lt MCA stenosis Echo 5/10 >> EF greater than 65%, severe LCH, severe MR MRI brain 5/11 >> multifocal acute ischemia scattered in both cerebral and cerebellar hemispheres  Micro Data:  COVID 5/10 >> negative Blood 5/10 >> GPC >>  Antimicrobials:  Cefepime 5/10 >> 5/11 Vancomycin 5/10 >> 5/11 Ancef 5/11 >>  Subjective:  Remains on pressors, vent.  Tm 103.9.  Objective   Blood pressure 128/90, pulse 100, temperature 98.6 F (37 C), temperature source Axillary, resp. rate 17, height 5\' 9"  (1.753 m), weight 85.3 kg, SpO2 99 %.    Vent Mode: PSV;CPAP FiO2 (%):  [40 %-100 %] 40 % Set Rate:  [14 bmp-16 bmp] 14 bmp Vt Set:  [500 mL-560 mL] 500 mL PEEP:  [5 cmH20] 5 cmH20 Pressure Support:  [5 cmH20] 5 cmH20 Plateau Pressure:  [14 cmH20-18 cmH20] 15 cmH20   Intake/Output Summary (Last 24 hours) at 12/21/2018 0841 Last data filed at 12/21/2018 0700 Gross per 24 hour  Intake 3393.53 ml  Output -  Net 3393.53 ml   Filed Weights   12/25/2018 1100 12/21/2018 1138 01/08/2019 1500  Weight: 87.2 kg 87.2 kg 85.3 kg     Examination:  General - diaphoretic Eyes - pupils reactive ENT - ETT in place Cardiac - regular, 2/6 SM Chest - scattered rhonchi Abdomen - soft, non tender, + bowel sounds Extremities - AV graft Lt arm with positive thrill Skin - no rashes Neuro - not following commands   Resolved Hospital Problem list     Assessment & Plan:   Acute respiratory failure from compromised airway. Plan - not ready for extubation trial - f/u CXR  Sepsis with GPC bacteremia in setting of new severe MR. Plan - day 2 of Abx - pressors to keep MAP > 65; change to levophed and place CVL - will likely need TEE  CVA. Plan - per neurology and neuro IR  ESRD. Plan - HD per renal - if hemodynamics remain an issue then might need CRRT  Elevated triglyceride while on diprivan. Plan - f/u triglyceride level   Best practice:  Diet: NPO DVT prophylaxis: SCDs GI prophylaxis: protonix Mobility: bed rest Code Status: full Disposition: ICU  Labs    CMP Latest Ref Rng & Units 12/17/2018 12/29/2018 03/05/2016  Glucose 70 - 99 mg/dL - 101(H) 83  BUN 6 - 20 mg/dL - 50(H) 18  Creatinine 0.61 - 1.24 mg/dL 12.50(H) 12.49(H) 6.73(H)  Sodium 135 - 145 mmol/L - 128(L) 137  Potassium 3.5 - 5.1 mmol/L - 4.5 4.0  Chloride 98 - 111 mmol/L - 80(L) 99(L)  CO2 22 - 32 mmol/L - 25 30  Calcium 8.9 - 10.3 mg/dL - 9.3 8.9  Total Protein 6.5 - 8.1 g/dL - 6.8 -  Total Bilirubin 0.3 - 1.2 mg/dL - 1.3(H) -  Alkaline Phos 38 - 126 U/L - 102 -  AST 15 - 41 U/L - 53(H) -  ALT 0 - 44 U/L - 21 -   CBC Latest Ref Rng & Units 01/05/2019 03/05/2016 03/04/2016  WBC 4.0 - 10.5 K/uL 20.2(H) 6.0 -  Hemoglobin 13.0 - 17.0 g/dL 11.0(L) 8.8(L) 7.8(L)  Hematocrit 39.0 - 52.0 % 31.6(L) 28.1(L) 23.0(L)  Platelets 150 - 400 K/uL 75(L) 144(L) -   ABG    Component Value Date/Time   PHART 7.462 (H) 12/26/2018 1653   PCO2ART 38.4 01/03/2019 1653   PO2ART 129 (H) 12/29/2018 1653   HCO3 26.8 12/26/2018 1653   TCO2 22 03/04/2016  1206   O2SAT 98.2 12/24/2018 1653   CBG (last 3)  Recent Labs    12/16/2018 1142  GLUCAP 105*    CC time 36 minutes  Chesley Mires, MD [place] 12/21/2018, 9:09 AM

## 2018-12-21 NOTE — Progress Notes (Signed)
eLink Physician-Brief Progress Note Patient Name: Thomas Adams DOB: April 01, 1961 MRN: [id number]   Date of Service  12/21/2018  HPI/Events of Note  Hypertriglyceridemia - Triglyceride level = 623. Patient is on a Propofol IV infusion.   eICU Interventions  Will order: 1. D/C Propofol IV infusion.  2. Fentanyl IV infusion. Titrate to RASS = 0 to -1.      Intervention Category Major Interventions: Other:  [name],Steven Cornelia Copa 12/21/2018, 4:02 AM

## 2018-12-21 NOTE — Progress Notes (Signed)
eLink Physician-Brief Progress Note Patient Name: Thomas Adams DOB: August 04, 1961 MRN: [id number]   Date of Service  12/21/2018  HPI/Events of Note  Intubated and ventilated. Notified of need for stress ulcer prophylaxis.   eICU Interventions  Will order Protonix IV.      Intervention Category Intermediate Interventions: Best-practice therapies (e.g. DVT, beta blocker, etc.)  Stormy Sabol Eugene 12/21/2018, 6:07 AM

## 2018-12-21 NOTE — Progress Notes (Signed)
eLink Physician-Brief Progress Note Patient Name: Thomas Adams DOB: May 05, 1961 MRN: [id number]   Date of Service  12/21/2018  HPI/Events of Note  Agitation - Patient putting legs over bed rails.   eICU Interventions  Will order: 1. Versed 1-2 mg IV Q 2 hours PRN.      Intervention Category Major Interventions: Delirium, psychosis, severe agitation - evaluation and management  Prudence Heiny Eugene 12/21/2018, 5:46 AM

## 2018-12-21 NOTE — Procedures (Signed)
Central Venous Catheter Insertion Procedure Note Keontay Vora [id number] 11/25/1960  Procedure: Insertion of Central Venous Catheter Indications: Drug and/or fluid administration  Procedure Details Consent: Risks of procedure as well as the alternatives and risks of each were explained to the (patient/caregiver).  Consent for procedure obtained. Time Out: Verified patient identification, verified procedure, site/side was marked, verified correct patient position, special equipment/implants available, medications/allergies/relevent history reviewed, required imaging and test results available.  Performed  Maximum sterile technique was used including antiseptics, cap, gloves, gown, hand hygiene, mask and sheet. Skin prep: Chlorhexidine; local anesthetic administered A antimicrobial bonded/coated triple lumen catheter was placed in the right internal jugular vein using the Seldinger technique. Catheter placed to 15 cm. Blood aspirated via all 3 ports and then flushed x 3. Line sutured x 2 and dressing applied.  Ultrasound guidance used.Yes.    Evaluation Blood flow good Complications: No apparent complications Patient did tolerate procedure well. Chest X-ray ordered to verify placement.  CXR: pending.   Georgann Housekeeper, AGACNP-BC [place] Pager [phone] or [phone]  12/21/2018 10:51 AM     '

## 2018-12-21 NOTE — Progress Notes (Signed)
PCCM INTERVAL PROGRESS NOTE  BP very labile. Will range from 50s-60s SBP to 180s 190s SBP. Precedex seems to be contributing to hypotension. Will DC. Has failed propofol due to triglyceride elevation. Will start versed infusion, which will hopefully be more hemodynamically neutral.     Georgann Housekeeper, AGACNP-BC [place] Pager [phone] or [phone]  12/21/2018 10:49 AM

## 2018-12-21 NOTE — Progress Notes (Signed)
PHARMACY - PHYSICIAN COMMUNICATION CRITICAL VALUE ALERT - BLOOD CULTURE IDENTIFICATION (BCID)  Thomas Adams is an 58 y.o. male who presented to [facility] on [facility]/19/2018 with a chief complaint of stroke, pt has ESRD on HD  Name of physician (or Provider) Contacted: Dr. Rory Percy  Current antibiotics: Cefepime   Changes to prescribed antibiotics recommended:  Change Cefepime to Cefazolin (adjusted for HD dosing)  Results for orders placed or performed during the hospital encounter of 01/01/2019  Blood Culture ID Panel (Reflexed) (Collected: 01/06/2019  1:58 PM)  Result Value Ref Range   Enterococcus species NOT DETECTED NOT DETECTED   Listeria monocytogenes NOT DETECTED NOT DETECTED   Staphylococcus species DETECTED (A) NOT DETECTED   Staphylococcus aureus (BCID) DETECTED (A) NOT DETECTED   Methicillin resistance NOT DETECTED NOT DETECTED   Streptococcus species NOT DETECTED NOT DETECTED   Streptococcus agalactiae NOT DETECTED NOT DETECTED   Streptococcus pneumoniae NOT DETECTED NOT DETECTED   Streptococcus pyogenes NOT DETECTED NOT DETECTED   Acinetobacter baumannii NOT DETECTED NOT DETECTED   Enterobacteriaceae species NOT DETECTED NOT DETECTED   Enterobacter cloacae complex NOT DETECTED NOT DETECTED   Escherichia coli NOT DETECTED NOT DETECTED   Klebsiella oxytoca NOT DETECTED NOT DETECTED   Klebsiella pneumoniae NOT DETECTED NOT DETECTED   Proteus species NOT DETECTED NOT DETECTED   Serratia marcescens NOT DETECTED NOT DETECTED   Haemophilus influenzae NOT DETECTED NOT DETECTED   Neisseria meningitidis NOT DETECTED NOT DETECTED   Pseudomonas aeruginosa NOT DETECTED NOT DETECTED   Candida albicans NOT DETECTED NOT DETECTED   Candida glabrata NOT DETECTED NOT DETECTED   Candida krusei NOT DETECTED NOT DETECTED   Candida parapsilosis NOT DETECTED NOT DETECTED   Candida tropicalis NOT DETECTED NOT DETECTED    [name] 12/21/2018  2:23 AM

## 2018-12-21 NOTE — Consult Note (Signed)
  [facility]       Reason for Consult: MSSA bacteremia    Referring Physician: CHAMP autoconsult  Active Problems:   Stroke (HCC)   Middle cerebral artery embolism, right   . chlorhexidine gluconate (MEDLINE KIT)  15 mL Mouth Rinse BID  . mouth rinse  15 mL Mouth Rinse 10 times per day  . pantoprazole sodium  40 mg Per Tube Q24H  . 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    Antibiotics: Vancomycin + cefepime > cefazolin now  HPI: Thomas Adams is a 57 y.o. male with ESRD on IHD, afib, HTN who presented with left hemiparesis and left facial droop and found significant MV dysfunction, multiple infarcts and bacteremia with MSSA.  Patient currently intubated, getting central line placement.  No history available from the patient.     Review of Systems:  Unable to be assessed due to patient factors   Past Medical History:  Diagnosis Date  . CKD (chronic kidney disease), stage III (HCC)   . Complication of anesthesia    " i HAD A HARD TIME WAKING WHEN THEY PUT MY FISTULA IN "  . Hypertension   . Renal insufficiency    dialysis 08/2013, tuesday/thursday/saturday    Social History   Tobacco Use  . Smoking status: Never Smoker  . Smokeless tobacco: Never Used  Substance Use Topics  . Alcohol use: No  . Drug use: No    Family History  Problem Relation Age of Onset  . Hypertension Father     Allergies  Allergen Reactions  . Penicillins Nausea Only, Rash and Other (See Comments)    also dizziness  history from chart review  Physical Exam: Constitutional: in no apparent distress Vitals:   12/21/18 0812 12/21/18 0900  BP:  127/79  Pulse: 100   Resp: 17 (!) 9  Temp:    SpO2: 99%    EYES: anicteric  ENMT: +ET Cardiovascular: Cor Tachy, + murmur Respiratory: diffuse rhonchi; GI: soft Musculoskeletal: left AV graft without erythema Skin: no rash  Lab Results  Component Value Date   WBC 20.2 (H) 12/23/2018   HGB 11.0 (L) 01/03/2019   HCT 31.6 (L) 12/15/2018   MCV 92.1 12/15/2018   PLT 75 (L) 12/19/2018    Lab Results  Component Value Date   CREATININE 12.50 (H) 01/10/2019   BUN 50 (H) 12/29/2018   NA 128 (L) 12/27/2018   K 4.5 01/10/2019   CL 80 (L) 12/19/2018   CO2 25 01/02/2019    Lab Results  Component Value Date   ALT 21 12/24/2018   AST 53 (H) 12/28/2018   ALKPHOS 102 12/25/2018     Microbiology: Recent Results (from the past 240 hour(s))  SARS Coronavirus 2 (CEPHEID- Performed in [place] hospital lab), Hosp Order     Status: None   Collection Time: 01/07/2019 11:45 AM  Result Value Ref Range Status   SARS Coronavirus 2 NEGATIVE NEGATIVE Final    Comment: (NOTE) If result is NEGATIVE SARS-CoV-2 target nucleic acids are NOT DETECTED. The SARS-CoV-2 RNA is generally detectable in upper and lower  respiratory specimens during the acute phase of infection. The lowest  concentration of SARS-CoV-2 viral copies this assay can detect is 250  copies / mL.   A negative result does not preclude SARS-CoV-2 infection  and should not be used as the sole basis for treatment or other  patient management decisions.  A negative result may occur with  improper specimen collection / handling, submission of specimen other  than nasopharyngeal swab, presence of viral mutation(s) within the  areas targeted by this assay, and inadequate number of viral copies  (<250 copies / mL). A negative result must be combined with clinical  observations, patient history, and epidemiological information. If result is POSITIVE SARS-CoV-2 target nucleic acids are DETECTED. The SARS-CoV-2 RNA is generally detectable in upper and lower  respiratory specimens dur ing the acute phase of  infection.  Positive  results are indicative of active infection with SARS-CoV-2.  Clinical  correlation with patient history and other diagnostic information is  necessary to determine patient infection status.  Positive results do  not rule out bacterial infection or co-infection with other viruses. If result is PRESUMPTIVE POSTIVE SARS-CoV-2 nucleic acids MAY BE PRESENT.   A presumptive positive result was obtained on the submitted specimen  and confirmed on repeat testing.  While 2019 novel coronavirus  (SARS-CoV-2) nucleic acids may be present in the submitted sample  additional confirmatory testing may be necessary for epidemiological  and / or clinical management purposes  to differentiate between  SARS-CoV-2 and other Sarbecovirus currently known to infect humans.  If clinically indicated additional testing with an alternate test  methodology [phone]) is advised. The SARS-CoV-2 RNA is generally  detectable in upper and lower respiratory sp ecimens during the acute  phase of infection. The expected result is Negative. Fact Sheet for Patients:  StrictlyIdeas.no Fact Sheet for Healthcare Providers: BankingDealers.co.za This test is not yet approved or cleared by the [place] FDA and has been authorized for detection and/or diagnosis of SARS-CoV-2 by FDA under an Emergency Use Authorization (EUA).  This EUA will remain in effect (meaning this test can be used) for the duration of the COVID-19 declaration under Section 564(b)(1) of the Act, 21 U.S.C. section 360bbb-3(b)(1), unless the authorization is terminated or revoked sooner. Performed at [facility], [place] [address], [place], [place] [address]   Blood Culture (routine x 2)     Status: Abnormal (Preliminary result)   Collection Time: 01/02/2019  1:57 PM  Result Value Ref Range Status   Specimen Description BLOOD RIGHT FOREARM  Final   Special Requests   Final     BOTTLES DRAWN AEROBIC AND ANAEROBIC Blood Culture adequate volume   Culture  Setup Time   Final    GRAM POSITIVE COCCI IN BOTH AEROBIC AND ANAEROBIC BOTTLES CRITICAL RESULT CALLED TO, READ BACK BY AND VERIFIED WITH: J. LEDFORD,PHARMD 0209 12/21/2018 Mena Goes Performed at [facility], [place] [address] [place], [place] [address]    Culture STAPHYLOCOCCUS AUREUS (A)  Final   Report Status PENDING  Incomplete  Blood Culture (routine x 2)     Status: Abnormal (Preliminary result)   Collection Time: 12/14/2018  1:58 PM  Result Value Ref Range Status   Specimen Description BLOOD RIGHT HAND  Final   Special Requests   Final    BOTTLES DRAWN AEROBIC AND ANAEROBIC Blood Culture adequate volume   Culture  Setup Time   Final    GRAM POSITIVE COCCI IN BOTH AEROBIC AND ANAEROBIC BOTTLES CRITICAL RESULT CALLED TO, READ BACK BY AND VERIFIED WITH: J. LEDFORD,PHARMD 0209 12/21/2018 Mena Goes Performed at [facility], [place] [address], [place], [place]  [address]    Culture STAPHYLOCOCCUS AUREUS (A)  Final   Report Status PENDING  Incomplete  Blood Culture ID Panel (Reflexed)     Status: Abnormal   Collection Time: 01/05/2019  1:58 PM  Result Value Ref Range Status   Enterococcus species NOT DETECTED NOT DETECTED Final   Listeria monocytogenes NOT DETECTED NOT DETECTED Final   Staphylococcus species DETECTED (A) NOT DETECTED Final    Comment: CRITICAL RESULT CALLED TO, READ BACK BY AND VERIFIED WITH: J. LEDFORD,PHARMD 0209 12/21/2018 T. TYSOR    Staphylococcus aureus (BCID) DETECTED (A) NOT DETECTED Final    Comment: Methicillin (oxacillin) susceptible Staphylococcus aureus (MSSA). Preferred therapy is anti staphylococcal beta lactam antibiotic (Cefazolin or Nafcillin), unless clinically contraindicated. CRITICAL RESULT CALLED TO, READ BACK BY AND VERIFIED WITH: J. LEDFORD,PHARMD 0209 12/21/2018 T. TYSOR    Methicillin resistance NOT DETECTED NOT DETECTED Final   Streptococcus species NOT  DETECTED NOT DETECTED Final   Streptococcus agalactiae NOT DETECTED NOT DETECTED Final   Streptococcus pneumoniae NOT DETECTED NOT DETECTED Final   Streptococcus pyogenes NOT DETECTED NOT DETECTED Final   Acinetobacter baumannii NOT DETECTED NOT DETECTED Final   Enterobacteriaceae species NOT DETECTED NOT DETECTED Final   Enterobacter cloacae complex NOT DETECTED NOT DETECTED Final   Escherichia coli NOT DETECTED NOT DETECTED Final   Klebsiella oxytoca NOT DETECTED NOT DETECTED Final   Klebsiella pneumoniae NOT DETECTED NOT DETECTED Final   Proteus species NOT DETECTED NOT DETECTED Final   Serratia marcescens NOT DETECTED NOT DETECTED Final   Haemophilus influenzae NOT DETECTED NOT DETECTED Final   Neisseria meningitidis NOT DETECTED NOT DETECTED Final   Pseudomonas aeruginosa NOT DETECTED NOT DETECTED Final   Candida albicans NOT DETECTED NOT DETECTED Final   Candida glabrata NOT DETECTED NOT DETECTED Final   Candida krusei NOT DETECTED NOT DETECTED Final   Candida parapsilosis NOT DETECTED NOT DETECTED Final   Candida tropicalis NOT DETECTED NOT DETECTED Final    Comment: Performed at [facility], [place] [address] [place], [place] [address]  MRSA PCR Screening     Status: None   Collection Time: 12/13/2018  3:15 PM  Result Value Ref Range Status   MRSA by PCR NEGATIVE NEGATIVE Final    Comment:        The GeneXpert MRSA Assay (FDA approved for NASAL specimens only), is one component of a comprehensive MRSA colonization surveillance program. It is not intended to diagnose MRSA infection nor to guide or monitor treatment for MRSA infections. Performed at [facility], [place] [address] [place], [place] [address]     Robert W Comer, [place] for Infectious Disease [facility] [email or web address] 12/21/2018, 10:25 AM

## 2018-12-21 NOTE — Progress Notes (Signed)
1845 paged Hoffman NP regarding labile BP, tachycardia, CVP 5-6. New orders for 500 LR bolus.

## 2018-12-21 NOTE — Progress Notes (Signed)
STROKE TEAM PROGRESS NOTE   INTERVAL HISTORY Patient remains critically ill with sepsis, hypotension, respiratory failure.  Neurologically he is awake and following commands but does have persistent left hemiparesis.  He was found on diagnostic angiogram to have right M3 branch occlusion which was not accessible for thrombectomy.  MRI scan of the brain was obtained this morning and shows small bihemispheric as well as bilateral cerebellar embolic infarcts likely from his proximal cardiac source.  In the setting of his staph bacteremia strong possibility of bacterial endocarditis  Vitals:   12/21/18 0812 12/21/18 0900 12/21/18 1000 12/21/18 1030  BP:  127/79 (!) 137/92 124/76  Pulse: 100     Resp: 17 (!) 9 11 16   Temp:      TempSrc:      SpO2: 99%     Weight:      Height:        CBC:  Recent Labs  Lab 01/02/2019 1139  WBC 20.2*  NEUTROABS 17.7*  HGB 11.0*  HCT 31.6*  MCV 92.1  PLT 75*    Basic Metabolic Panel:  Recent Labs  Lab 12/27/2018 1139 12/30/2018 1146  NA 128*  --   K 4.5  --   CL 80*  --   CO2 25  --   GLUCOSE 101*  --   BUN 50*  --   CREATININE 12.49* 12.50*  CALCIUM 9.3  --    Lipid Panel:     Component Value Date/Time   CHOL 155 12/21/2018 0513   TRIG 691 (H) 12/21/2018 0513   HDL <10 (L) 12/21/2018 0513   CHOLHDL NOT CALCULATED 12/21/2018 0513   VLDL UNABLE TO CALCULATE IF TRIGLYCERIDE OVER 400 mg/dL 12/21/2018 0513   LDLCALC UNABLE TO CALCULATE IF TRIGLYCERIDE OVER 400 mg/dL 12/21/2018 0513   HgbA1c:  Lab Results  Component Value Date   HGBA1C 5.5 12/21/2018    PHYSICAL EXAM Middle-age male who is sedated intubated and on ventilator.  He is on vasopressor drips. . Afebrile. Head is nontraumatic. Neck is supple without bruit.    Cardiac exam no murmur or gallop. Lungs are clear to auscultation. Distal pulses are well felt. Neurological Exam :  Patient is awake alert.  He follows simple commands consistently.  He has right gaze preference but able to  look to the left past midline.  He blinks to threat more on the right than the left.  Pupils equal reactive.  Fundi not visualized.  Mild left lower facial weakness.  Left hemiplegia with 0/5 left upper extremity strength.  2/5 left lower extremity strength. ASSESSMENT/PLAN Thomas Adams is a 58 y.o. male with history of atrial fibrillation not on AC, HTN and ESRD on HD presenting with left hemiparesis and left facial droop with dysarthria. Sent to IR but no correctable lesion.  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 Small vessel disease. ASPECTS 10.     CTA head & neck suspected mid R M2 branch occlusion w/ 72mL penumbra R parietal. No LVO. Possible severe L MCA origin stenosis. Aortic atherosclerosis   CT perfusion no core  Cerebral angio RT MCA   Sup division M3 branch tapered occlusion. No intervention  MRI  Multifocal B supratentorial infarcts. No shift or mass effect. General atrophy. Mult scattered foci of microhemorrhages at sites of infarct. Abnormal flow void R transverse sinus (sinus clear on previous CTA).  2D Echo EF .65%. severe  LVH. LA and RA dilated. Severe AV thickening. No source of embolus seen.  HIV pending   LDL UTC d/t high TG 691  HgbA1c 5.5  SCDs for VTE prophylaxis  No antithrombotic prior to admission, now on No antithrombotic given risk for hmg  Therapy recommendations:  pending   Disposition:  pending   Acute Respiratory Failure, compromised airway  Intubated initially for IR  Remains intubated  PCCM on board  Sepsis w/ MSSA bacteremia in setting of new severe MR  WBC 20.2  TM 103.9  On cefazolin  On pressors - wean  CT abd and chest pending   CXR pending   Likely need TEE  Lactic acid 3.3  Atrial Fibrillation w/ RVR . Home anticoagulation:  none  . Not an AC candidate d/t endocarditis and risk for bleeding . INR  1.7 . PLT 75   Hypertensive Emergency  BP as high as 174/139  Home meds:  Norvasc 10, hydralazine 25 tid, labetalol 100 bid, lisinopril 40  Stable . BP goal normotensive  Hyperlipidemia  Home meds:  No statin  LDL UTC d/t high TG 691, goal < 70  Recheck lipids once off lipid based drips  Consider stat if LDL remains elevated at that time  Dysphagia  On vent  TF per CCM  Other Stroke Risk Factors  Overweight, Body mass index is 27.77 kg/m., recommend weight loss, diet and exercise as appropriate   Other Active Problems  ESRD on HD TTS. Nephrology on board.   Anemia of chronic disease 11.0  Hospital day # 1  I have personally obtained history,examined this patient, reviewed notes, independently viewed imaging studies, participated in medical decision making and plan of care.ROS completed by me personally and pertinent positives fully documented  I have made any additions or clarifications directly to the above note.  He presented with right hemiparesis secondary to by cerebral embolic infarcts likely of cardioembolic etiology from bacterial endocarditis in the setting of staph bacteremia.  He remains critically ill with sepsis, hypertension, respiratory failure and stroke and requires multiorgan support.  Continue antibiotics as per ID and management of sepsis and respiratory failure as per pulmonary critical care medicine.  Continue aspirin for stroke prevention patient is not a good candidate for anticoagulation at the present time due to his endocarditis and risk for bleeding despite having atrial fibrillation.Discussed with Dr. Halford Chessman pulmonary critical care medicine and Dr. Grayland Ormond nephrology This patient is critically ill and at significant risk of neurological worsening, death and care requires constant monitoring of vital signs, hemodynamics,respiratory and cardiac monitoring, extensive review of multiple databases, frequent neurological assessment, discussion with  family, other specialists and medical decision making of high complexity.I have made any additions or clarifications directly to the above note.This critical care time does not reflect procedure time, or teaching time or supervisory time of PA/NP/Med Resident etc but could involve care discussion time.  I spent 40 minutes of neurocritical care time  in the care of  this patient.     Antony Contras, MD Medical Director [place] Pager: [phone] 12/21/2018 2:52 PM   To contact Stroke Continuity provider, please refer to [email or web address] After hours, contact General Neurology

## 2018-12-21 NOTE — Progress Notes (Signed)
SLP Cancellation Note  Patient Details Name: Thomas Adams MRN: [id number] DOB: [birth year]   Cancelled treatment:        On vent. Will continue to follow.    Houston Siren 12/21/2018, 8:28 AM   Orbie Pyo Colvin Caroli.Ed Risk analyst [phone] Office [phone]

## 2018-12-21 NOTE — Progress Notes (Signed)
OT Cancellation/Discharge Note  Patient Details Name: Thomas Adams MRN: [id number] DOB: 06/26/1961   Cancelled Treatment:    Reason Eval/Treat Not Completed: Patient not medically ready - OT orders discontinued.  Please reorder when pt medically stable.   Lucille Passy, OTR/L [place] Pager [phone] Office [phone]   Lucille Passy M 12/21/2018, 12:07 PM

## 2018-12-22 ENCOUNTER — Inpatient Hospital Stay (HOSPITAL_COMMUNITY): Payer: Medicare Other

## 2018-12-22 DIAGNOSIS — I669 Occlusion and stenosis of unspecified cerebral artery: Secondary | ICD-10-CM

## 2018-12-22 DIAGNOSIS — I080 Rheumatic disorders of both mitral and aortic valves: Secondary | ICD-10-CM

## 2018-12-22 LAB — CBC
HCT: 25.7 % — ABNORMAL LOW (ref 39.0–52.0)
Hemoglobin: 8.8 g/dL — ABNORMAL LOW (ref 13.0–17.0)
MCH: 31.9 pg (ref 26.0–34.0)
MCHC: 34.2 g/dL (ref 30.0–36.0)
MCV: 93.1 fL (ref 80.0–100.0)
Platelets: 54 10*3/uL — ABNORMAL LOW (ref 150–400)
RBC: 2.76 MIL/uL — ABNORMAL LOW (ref 4.22–5.81)
RDW: 14.8 % (ref 11.5–15.5)
WBC: 15.4 10*3/uL — ABNORMAL HIGH (ref 4.0–10.5)
nRBC: 0 % (ref 0.0–0.2)

## 2018-12-22 LAB — GLUCOSE, CAPILLARY
Glucose-Capillary: 106 mg/dL — ABNORMAL HIGH (ref 70–99)
Glucose-Capillary: 114 mg/dL — ABNORMAL HIGH (ref 70–99)
Glucose-Capillary: 72 mg/dL (ref 70–99)

## 2018-12-22 LAB — RENAL FUNCTION PANEL
Albumin: 2 g/dL — ABNORMAL LOW (ref 3.5–5.0)
Albumin: 2 g/dL — ABNORMAL LOW (ref 3.5–5.0)
Anion gap: 16 — ABNORMAL HIGH (ref 5–15)
Anion gap: 14 (ref 5–15)
BUN: 79 mg/dL — ABNORMAL HIGH (ref 6–20)
BUN: 61 mg/dL — ABNORMAL HIGH (ref 6–20)
CO2: 18 mmol/L — ABNORMAL LOW (ref 22–32)
CO2: 19 mmol/L — ABNORMAL LOW (ref 22–32)
Calcium: 8.3 mg/dL — ABNORMAL LOW (ref 8.9–10.3)
Calcium: 8.4 mg/dL — ABNORMAL LOW (ref 8.9–10.3)
Chloride: 94 mmol/L — ABNORMAL LOW (ref 98–111)
Chloride: 99 mmol/L (ref 98–111)
Creatinine, Ser: 12.67 mg/dL — ABNORMAL HIGH (ref 0.61–1.24)
Creatinine, Ser: 9.12 mg/dL — ABNORMAL HIGH (ref 0.61–1.24)
GFR calc Af Amer: 5 mL/min — ABNORMAL LOW (ref >60)
GFR calc Af Amer: 7 mL/min — ABNORMAL LOW (ref >60)
GFR calc non Af Amer: 4 mL/min — ABNORMAL LOW (ref >60)
GFR calc non Af Amer: 6 mL/min — ABNORMAL LOW (ref >60)
Glucose, Bld: 94 mg/dL (ref 70–99)
Glucose, Bld: 100 mg/dL — ABNORMAL HIGH (ref 70–99)
Phosphorus: 7.2 mg/dL — ABNORMAL HIGH (ref 2.5–4.6)
Phosphorus: 4.3 mg/dL (ref 2.5–4.6)
Potassium: 4.8 mmol/L (ref 3.5–5.1)
Potassium: 4.2 mmol/L (ref 3.5–5.1)
Sodium: 128 mmol/L — ABNORMAL LOW (ref 135–145)
Sodium: 132 mmol/L — ABNORMAL LOW (ref 135–145)

## 2018-12-22 LAB — URINE CULTURE: Culture: NO GROWTH

## 2018-12-22 LAB — LIPASE, BLOOD: Lipase: 30 U/L (ref 11–51)

## 2018-12-22 LAB — TRIGLYCERIDES: Triglycerides: 891 mg/dL — ABNORMAL HIGH (ref <150)

## 2018-12-22 MED ORDER — HEPARIN SODIUM (PORCINE) 5000 UNIT/ML IJ SOLN: 5000.0000 [IU] | Freq: Three times a day (TID) | INTRAMUSCULAR | Status: DC

## 2018-12-22 MED ORDER — ASPIRIN 81 MG PO CHEW
81.0000 mg | CHEWABLE_TABLET | Freq: Every day | ORAL | Status: DC
  Administered 2018-12-22 – 2018-12-27 (×6): 81 mg
  Filled 2018-12-22 (×6): qty 1

## 2018-12-22 MED ORDER — HEPARIN SODIUM (PORCINE) 1000 UNIT/ML DIALYSIS: 1000.0000 [IU] | INTRAMUSCULAR | Status: DC | PRN

## 2018-12-22 MED ORDER — PRISMASOL BGK 4/2.5 32-4-2.5 MEQ/L REPLACEMENT SOLN
Status: DC
  Administered 2018-12-22 – 2018-12-24 (×4): via INTRAVENOUS_CENTRAL
  Filled 2018-12-22 (×4): qty 5000
  Filled 2018-12-22: qty 20000
  Filled 2018-12-22 (×3): qty 5000
  Filled 2018-12-22 (×2): qty 20000
  Filled 2018-12-22: qty 5000
  Filled 2018-12-22: qty 20000

## 2018-12-22 MED ORDER — DEXMEDETOMIDINE HCL IN NACL 200 MCG/50ML IV SOLN
0.0000 ug/kg/h | INTRAVENOUS | Status: DC
  Administered 2018-12-24: 0.6 ug/kg/h via INTRAVENOUS
  Administered 2018-12-24: 0.2 ug/kg/h via INTRAVENOUS
  Administered 2018-12-24: 0.3 ug/kg/h via INTRAVENOUS
  Administered 2018-12-24: 0.5 ug/kg/h via INTRAVENOUS
  Administered 2018-12-25: 03:00:00 0.7 ug/kg/h via INTRAVENOUS
  Administered 2018-12-25: 08:00:00 0.4 ug/kg/h via INTRAVENOUS
  Administered 2018-12-25: 11:00:00 1.2 ug/kg/h via INTRAVENOUS
  Administered 2018-12-25: 13:00:00 1 ug/kg/h via INTRAVENOUS
  Filled 2018-12-22 (×9): qty 50

## 2018-12-22 MED ORDER — PRISMASOL BGK 4/2.5 32-4-2.5 MEQ/L REPLACEMENT SOLN
Status: DC
  Administered 2018-12-22 – 2018-12-24 (×4): via INTRAVENOUS_CENTRAL
  Filled 2018-12-22 (×10): qty 5000

## 2018-12-22 MED ORDER — VITAL AF 1.2 CAL PO LIQD
1000.0000 mL | ORAL | Status: DC
  Administered 2018-12-22 – 2018-12-26 (×4): 1000 mL

## 2018-12-22 MED ORDER — PRISMASOL BGK 4/2.5 32-4-2.5 MEQ/L IV SOLN
INTRAVENOUS | Status: DC
  Administered 2018-12-22 – 2018-12-24 (×18): via INTRAVENOUS_CENTRAL
  Filled 2018-12-22 (×4): qty 5000
  Filled 2018-12-22 (×2): qty 20000
  Filled 2018-12-22 (×13): qty 5000
  Filled 2018-12-22: qty 20000
  Filled 2018-12-22 (×13): qty 5000
  Filled 2018-12-22: qty 20000
  Filled 2018-12-22 (×5): qty 5000

## 2018-12-22 MED ORDER — CEFAZOLIN SODIUM-DEXTROSE 2-4 GM/100ML-% IV SOLN
2.0000 g | Freq: Two times a day (BID) | INTRAVENOUS | Status: DC
  Administered 2018-12-22 – 2018-12-23 (×3): 2 g via INTRAVENOUS
  Filled 2018-12-22 (×4): qty 100

## 2018-12-22 MED ORDER — PRO-STAT SUGAR FREE PO LIQD
60.0000 mL | Freq: Four times a day (QID) | ORAL | Status: DC
  Administered 2018-12-22 – 2018-12-30 (×34): 60 mL
  Filled 2018-12-22 (×33): qty 60

## 2018-12-22 NOTE — Progress Notes (Signed)
PHARMACY NOTE:  ANTIMICROBIAL RENAL DOSAGE ADJUSTMENT  Current antimicrobial regimen includes a mismatch between antimicrobial dosage and estimated renal function.  As per policy approved by the Pharmacy & Therapeutics and Medical Executive Committees, the antimicrobial dosage will be adjusted accordingly.  Current antimicrobial dosage:  Cefazolin 2g IV qHD  Indication: MSSA bacteremia  Renal Function:  Estimated Creatinine Clearance: 7.1 mL/min (A) (by C-G formula based on SCr of 12.67 mg/dL (H)). []      On intermittent HD, scheduled: [x]      On CRRT - initiated this AM    Antimicrobial dosage has been changed to:  Cefazolin 2g IV q12h  Thank you for allowing pharmacy to be a part of this patient's care.  Mila Merry Gerarda Fraction, PharmD, [place] PGY2 Infectious Diseases Pharmacy Resident Phone: [phone] 12/22/2018 9:41 AM

## 2018-12-22 NOTE — Progress Notes (Signed)
1350 paged CCM NP regarding patient becoming tachycardic, hypertensive, tachypnic, shivering, and fighting the ventilator. RT at bedside. MD and NP to bedside. See MAR for new orders.

## 2018-12-22 NOTE — Progress Notes (Signed)
NAME:  Thomas Adams, MRN:  [id number], DOB:  08-Nov-1960, LOS: 2 ADMISSION DATE:  12/21/2018, CONSULTATION DATE:  12/22/18 REFERRING MD:  Dr. Germaine Pomfret , CHIEF COMPLAINT:  Left side weakness   Brief History   58 yr old with history of HTN, afib, febrile and coughing last night, found with left facial droop, left sided weakness this morning. 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 Brought to ICU intubated  Past Medical History  HTN ESRD, dialysis TThSat Afib w/ RVR  Significant Hospital Events   5/10 Angiogram in IR >> no clot retraction due to distal location of thrombus; hypotension from meds 5/11 d/c diprivan due to triglyceride levels 5/12 start CRRT; start amiodarone  Consults:  Nephrology  ID  Procedures:  ETT 5/10 >>  Rt radial aline 5/10 >>  Rt IJ HD cath 5/11 >>   Significant Diagnostic Tests:  CT angio head/neck 5/10 >> Rt M2 occlusion, severe Lt MCA stenosis Echo 5/10 >> EF greater than 65%, severe LCH, severe MR MRI brain 5/11 >> multifocal acute ischemia scattered in both cerebral and cerebellar hemispheres  Micro Data:  COVID 5/10 >> negative Blood 5/10 >> Staph aureus  Antimicrobials:  Cefepime 5/10 >> 5/11 Vancomycin 5/10 >> 5/11 Ancef 5/11 >>  Subjective:  Amiodarone started overnight.  Objective   Blood pressure 110/67, pulse (!) 102, temperature (!) 97.4 F (36.3 C), temperature source Axillary, resp. rate (!) 22, height 5\' 9"  (1.753 m), weight 85.3 kg, SpO2 100 %. CVP:  [4 mmHg-14 mmHg] 5 mmHg  Vent Mode: CPAP;PSV FiO2 (%):  [35 %-40 %] 35 % Set Rate:  [14 bmp] 14 bmp Vt Set:  [560 mL] 560 mL PEEP:  [5 cmH20] 5 cmH20 Pressure Support:  [5 cmH20] 5 cmH20 Plateau Pressure:  [16 cmH20-20 cmH20] 16 cmH20   Intake/Output Summary (Last 24 hours) at 12/22/2018 0914 Last data filed at 12/22/2018 0800 Gross per 24 hour  Intake 2333.88 ml  Output 35 ml  Net 2298.88 ml   Filed Weights    01/04/2019 1100 12/22/2018 1138 12/30/2018 1500  Weight: 87.2 kg 87.2 kg 85.3 kg    Examination:  General - sedated Eyes - pupils reactive ENT - ETT in place Cardiac - irregular, tachycardic Chest - b/l rhonchi Abdomen - soft, non tender, + bowel sounds Extremities - 1+ edema Skin - no rashes Neuro - RASS -2   CXR (reviewed by me) - patchy b/l ASD with    Resolved Hospital Problem list     Assessment & Plan:   Acute respiratory failure from compromised airway. Plan - not ready for extubation trial - f/u CXR  Sepsis with Staph aureus bacteremia in setting of new severe MR. Plan - pressors to keep MAP > 65 - day 3 of Abx, currently on ancef per ID - will likely need TEE >> primary team to arrange  CVA. Plan - per neurology  ESRD. Hyponatremia. Non gap metabolic acidosis. Plan - start CRRT 5//12  Elevated triglyceride while on diprivan. Plan - f/u triglyceride - check lipase  Anemia of critical illness and chronic disease. Thrombocytopenia in setting of sepsis. Plan - f/u CBC - transfuse for Hb < 7, PLT < 10K   Best practice:  Diet: NPO DVT prophylaxis: SCDs GI prophylaxis: protonix Mobility: bed rest Code Status: full Disposition: ICU  Labs    CMP Latest Ref Rng & Units 12/22/2018 12/21/2018 01/04/2019  Glucose 70 - 99 mg/dL 94 79 -  BUN 6 - 20 mg/dL 79(H) 74(H) -  Creatinine 0.61 - 1.24 mg/dL 12.67(H) 12.93(H) 12.50(H)  Sodium 135 - 145 mmol/L 128(L) 128(L) -  Potassium 3.5 - 5.1 mmol/L 4.8 4.9 -  Chloride 98 - 111 mmol/L 94(L) 92(L) -  CO2 22 - 32 mmol/L 18(L) 19(L) -  Calcium 8.9 - 10.3 mg/dL 8.3(L) 8.0(L) -  Total Protein 6.5 - 8.1 g/dL - - -  Total Bilirubin 0.3 - 1.2 mg/dL - - -  Alkaline Phos 38 - 126 U/L - - -  AST 15 - 41 U/L - - -  ALT 0 - 44 U/L - - -   CBC Latest Ref Rng & Units 12/22/2018 12/22/2018 03/05/2016  WBC 4.0 - 10.5 K/uL 15.4(H) 20.2(H) 6.0  Hemoglobin 13.0 - 17.0 g/dL 8.8(L) 11.0(L) 8.8(L)  Hematocrit 39.0 - 52.0 %  25.7(L) 31.6(L) 28.1(L)  Platelets 150 - 400 K/uL 54(L) 75(L) 144(L)   ABG    Component Value Date/Time   PHART 7.462 (H) 12/23/2018 1653   PCO2ART 38.4 12/29/2018 1653   PO2ART 129 (H) 01/05/2019 1653   HCO3 26.8 12/30/2018 1653   TCO2 22 03/04/2016 1206   O2SAT 98.2 01/07/2019 1653   CBG (last 3)  Recent Labs    12/21/2018 1142  GLUCAP 105*    CC time 32 minutes  Chesley Mires, MD [place] 12/22/2018, 9:14 AM

## 2018-12-22 NOTE — Progress Notes (Addendum)
[facility]    NEPHROLOGY PROGRESS NOTE  SUBJECTIVE:   Patient had Afib with RVR and rate 114 to 140's per charting.  Amio was added.  He has continued on levo with fluctuating requirements.  He had a vascath placed yesterday in anticipation of CRRT.  Following some commands, may be able to be extubated soon per nursing.  They have a MAP goal of 65 now for his BP   Review of systems:  Unable to obtain 2/2 intubated    OBJECTIVE:  Vitals:   12/22/18 0645 12/22/18 0700  BP: (!) 87/63 103/67  Pulse: 93 86  Resp: 14 15  Temp:    SpO2: 100% 100%    Intake/Output Summary (Last 24 hours) at 12/22/2018 0717 Last data filed at 12/22/2018 0700 Gross per 24 hour  Intake 2718.55 ml  Output 35 ml  Net 2683.55 ml      General: critically ill adult male intubated and sedated  HEENT: NCAT  CV:  S1S2 no rub  Lungs:  Clear to auscultation bilaterally  Abd:  abd SNT/ND with normal BS GU:  Bladder non-palpable Extremities:  No LE edema Access: left forearm AV fistula with suturing noted; good bruit and thrill; RIJ nontunneled vascath  Skin:  No skin rash on extremities exposed Neuro - on continuous sedation   MEDICATIONS:  . chlorhexidine gluconate (MEDLINE KIT)  15 mL Mouth Rinse BID  . mouth rinse  15 mL Mouth Rinse 10 times per day  . pantoprazole sodium  40 mg Per Tube Q24H  . sodium chloride flush  3 mL Intravenous Once     LABS:   CBC Latest Ref Rng & Units 12/22/2018 12/22/2018 03/05/2016  WBC 4.0 - 10.5 K/uL 15.4(H) 20.2(H) 6.0  Hemoglobin 13.0 - 17.0 g/dL 8.8(L) 11.0(L) 8.8(L)  Hematocrit 39.0 - 52.0 % 25.7(L) 31.6(L) 28.1(L)  Platelets 150 - 400 K/uL 54(L) 75(L) 144(L)    CMP Latest Ref Rng & Units 12/22/2018 12/21/2018 12/17/2018  Glucose 70 - 99 mg/dL 94 79 -  BUN 6 - 20 mg/dL 79(H) 74(H) -  Creatinine 0.61 - 1.24 mg/dL 12.67(H) 12.93(H) 12.50(H)  Sodium 135 - 145 mmol/L 128(L) 128(L) -  Potassium 3.5 - 5.1 mmol/L 4.8 4.9 -  Chloride 98 - 111 mmol/L  94(L) 92(L) -  CO2 22 - 32 mmol/L 18(L) 19(L) -  Calcium 8.9 - 10.3 mg/dL 8.3(L) 8.0(L) -  Total Protein 6.5 - 8.1 g/dL - - -  Total Bilirubin 0.3 - 1.2 mg/dL - - -  Alkaline Phos 38 - 126 U/L - - -  AST 15 - 41 U/L - - -  ALT 0 - 44 U/L - - -        Component Value Date/Time   PHART 7.462 (H) 01/02/2019 1653   PCO2ART 38.4 12/25/2018 1653   PO2ART 129 (H) 12/16/2018 1653   HCO3 26.8 12/27/2018 1653   TCO2 22 03/04/2016 1206   O2SAT 98.2 12/21/2018 1653    ASSESSMENT/PLAN:     The patient is a 57 y.o. year old male patient with a past medical history significant for atrial fibrillation, hypertension, end-stage renal disease on hemodialysis Tuesday, Thursday, and Saturday from [facility] who presented to the emergency department via EMS with acute onset of left hemiparesis and left facial droop with dysarthria.    1.  End-stage renal disease on hemodialysis.   - normally HD TTS - Will initiate CRRT via temporary dialysis catheter.  Keep even for now.  Discontinue normal   saline continuous infusion   2.  Acute ischemic stroke.  Unable to perform thrombectomy due to location.  Continue supportive care. outpatient antihypertensives held.  Concern for septic emboli.  Note mitral valve calcifications noted on TTE concerning for source of emboli.  Per primary team and neurology.  3.  Anemia of chronic kidney disease.  Worsening.  No acute indication for PRBC's. Would avoid ESA's in the setting of an acute stroke and defer IV iron with bacteremia.  4.  Staphylococcal bacteremia.  Continue vancomycin.  Left forearm AV fistula does not appear to be source of infection.  Infectious diseases following.  It does appear that that left forearm AV fistula had some potential recent intervention as suturing noted   5.  Secondary hyperparathyroidism. off binders as starting CRRT   Lori C Foster   

## 2018-12-22 NOTE — Progress Notes (Signed)
SLP Cancellation Note  Patient Details Name: Serapio Edelson MRN: [id number] DOB: 04/10/1961   Cancelled treatment:       Reason Eval/Treat Not Completed: Medical issues which prohibited therapy Pt continues to intubated and is not appropriate for ST services this date.    Kurk Corniel 12/22/2018, 9:22 AM

## 2018-12-22 NOTE — Progress Notes (Signed)
Initial Nutrition Assessment RD working remotely.  DOCUMENTATION CODES:   Not applicable  INTERVENTION:   Initiate via OG tube Vital AF 1.2 @ 40 ml/hr 60 ml Prostat QID  Provides: 1952 kcal, 192 grams protein, 778 ml free water, 1620 mg potassium, 810 mg phosphorus, and 810 mg calcium.    NUTRITION DIAGNOSIS:   Increased nutrient needs related to acute illness as evidenced by estimated needs.  GOAL:   Patient will meet greater than or equal to 90% of their needs  MONITOR:   Vent status, I & O's, TF tolerance  REASON FOR ASSESSMENT:   Consult, Ventilator Enteral/tube feeding initiation and management  ASSESSMENT:   Pt with PMH of HTN, afib, ESRD on HD TThSat admitted with CNS emboli, R M2 occlusion, severe L MCA stenosis and sepsis with staph aureus bacteremia in setting of new MR.     5/10 OG tube placed and recommended to advance 5/12 start CRRT  Contacted MD, ok to start TF as pt will remain on vent.   Patient is currently intubated on ventilator support MV: 11.2 L/min Temp (24hrs), Avg:97.9 F (36.6 C), Min:97.4 F (36.3 C), Max:98.4 F (36.9 C)  Medications reviewed Labs reviewed: Na 128 (L), PO4: 7.2 (H), TG: 891 (H) Pt is 6 L positive     NUTRITION - FOCUSED PHYSICAL EXAM:  Deferred   Diet Order:   Diet Order            Diet NPO time specified  Diet effective now              EDUCATION NEEDS:   No education needs have been identified at this time  Skin:  Skin Assessment: Reviewed RN Assessment  Last BM:  unknown  Height:   Ht Readings from Last 1 Encounters:  12/19/2018 5\' 9"  (1.753 m)    Weight:   Wt Readings from Last 1 Encounters:  01/01/2019 85.3 kg    Ideal Body Weight:     BMI:  Body mass index is 27.77 kg/m.  Estimated Nutritional Needs:   Kcal:  1912  Protein:  170-213 grams  Fluid:  > 1.9 L/day  Maylon Peppers RD, LDN, CNSC [phone] Pager [phone] After Hours Pager

## 2018-12-22 NOTE — Progress Notes (Signed)
STROKE TEAM PROGRESS NOTE   INTERVAL HISTORY Patient remains critically ill intubated.  He is on sedation.  Neurological exam essentially unchanged.  He has been started on CRRT for dialysis.  He remains hypotensive and is on vasopressor support.  Blood cultures growing staph but sensitivities not back  Vitals:   12/22/18 0645 12/22/18 0700 12/22/18 0733 12/22/18 0800  BP: (!) 87/63 103/67  107/75  Pulse: 93 86 87 88  Resp: 14 15 14 17   Temp:    (!) 97.4 F (36.3 C)  TempSrc:    Axillary  SpO2: 100% 100% 100% 100%  Weight:      Height:        CBC:  Recent Labs  Lab 12/23/2018 1139 12/22/18 0537  WBC 20.2* 15.4*  NEUTROABS 17.7*  --   HGB 11.0* 8.8*  HCT 31.6* 25.7*  MCV 92.1 93.1  PLT 75* 54*    Basic Metabolic Panel:  Recent Labs  Lab 12/21/18 2226 12/22/18 0537  NA 128* 128*  K 4.9 4.8  CL 92* 94*  CO2 19* 18*  GLUCOSE 79 94  BUN 74* 79*  CREATININE 12.93* 12.67*  CALCIUM 8.0* 8.3*  MG 1.9  --   PHOS  --  7.2*    PHYSICAL EXAM: Middle-age male who is sedated intubated and on ventilator.  He is on vasopressor drips. . Afebrile. Head is nontraumatic. Neck is supple without bruit.    Cardiac exam no murmur or gallop. Lungs are clear to auscultation. Distal pulses are well felt. Neurological Exam :  Patient is sedated, intubated but can be aroused with relation.Marland Kitchen  He follows a few simple commands     He has right gaze preference    He blinks to threat more on the right than the left.  Pupils equal reactive.  Fundi not visualized.  Mild left lower facial weakness.  Left hemiplegia with 0/5 left upper extremity strength.  2/5 left lower extremity strength.  ASSESSMENT/PLAN Mr. Abdishakur Gottschall is a 58 y.o. male with history of atrial fibrillation not on AC, HTN and ESRD on HD presenting with left hemiparesis and left facial droop with dysarthria. Sent to IR but no correctable lesion.  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 Small vessel disease. ASPECTS 10.     CTA head & neck suspected mid R M2 branch occlusion w/ 49mL penumbra R parietal. No LVO. Possible severe L MCA origin stenosis. Aortic atherosclerosis   CT perfusion no core  Cerebral angio RT MCA   Sup division M3 branch tapered occlusion. No intervention  MRI  Multifocal B supratentorial infarcts. No shift or mass effect. General atrophy. Mult scattered foci of microhemorrhages at sites of infarct. Abnormal flow void R transverse sinus (sinus clear on previous CTA).  2D Echo EF .65%. severe LVH. LA and RA dilated. Severe AV thickening. No source of embolus seen.  HIV negative   LDL UTC d/t high TG 691  HgbA1c 5.5  SCDs for VTE prophylaxis. Add Heparin 5000 units sq tid   No antithrombotic prior to admission, was on No antithrombotic given risk for hmg. Will start aspirin 81 mg today.  Therapy recommendations:  pending   Disposition:  pending   Acute Respiratory Failure, compromised airway  Intubated initially for IR  Remains intubated, sedated  PCCM on board  Sepsis w/ MSSA bacteremia in setting of new severe MR  WBC 20.2->15.4  TM 103.9->afebrile  On cefazolin  CXR advanced FT, o/w stable  Likely need TEE  Lactic acid 3.3  Atrial Fibrillation w/ RVR . Home anticoagulation:  none  . Not an AC candidate d/t endocarditis and risk for bleeding . HR as high as 140s . Amio bolus and infusion added during the night . HR now 90-100s . INR 1.7 . PLT 75 . Mg 1.9, today's pending    Hypotension in setting of sepsis  Hypertensive Emergency on admission   BP as high as 174/139 on admission  Home meds:  Norvasc 10, hydralazine 25 tid, labetalol 100 bid, lisinopril 40  BP now low 80-110s  On pressor - wean as able . BP goal normotensive  Hyperlipidemia  Home meds:  No statin  LDL UTC d/t high TG 691, goal < 70  Recheck lipids once off lipid based  drips  Consider statin if LDL remains elevated at that time  Dysphagia  On vent  TF per CCM  Other Stroke Risk Factors  Overweight, Body mass index is 27.77 kg/m., recommend weight loss, diet and exercise as appropriate   Other Active Problems  ESRD on HD TTS. Nephrology on board. vasc cath placed in anticipation of CRRT via temporary dialysis catheter  Anemia of chronic kidney disease 11.0->8.8  Secondary hyperparathyroidism   Hospital day # 2 I have personally obtained history,examined this patient, reviewed notes, independently viewed imaging studies, participated in medical decision making and plan of care.ROS completed by me personally and pertinent positives fully documented  I have made any additions or clarifications directly to the above note.  Plan continue antibiotics follow culture sensitivities.  Check TEE when patient is stable for the procedure .  Patient too high risk for anticoagulation for his A. Fib Discussed with Dr. Halford Chessman. This patient is critically ill and at significant risk of neurological worsening, death and care requires constant monitoring of vital signs, hemodynamics,respiratory and cardiac monitoring, extensive review of multiple databases, frequent neurological assessment, discussion with family, other specialists and medical decision making of high complexity.I have made any additions or clarifications directly to the above note.This critical care time does not reflect procedure time, or teaching time or supervisory time of PA/NP/Med Resident etc but could involve care discussion time.  I spent 30 minutes of neurocritical care time  in the care of  this patient.    Antony Contras, MD Medical Director [place] Pager: [phone] 12/22/2018 2:54 PM   Antony Contras, MD Medical Director [place] Pager: [phone] 12/22/2018 8:56 AM   To contact Stroke Continuity provider, please refer to [email or web address] After hours,  contact General Neurology

## 2018-12-22 NOTE — Progress Notes (Signed)
[facility] for Infectious Disease   Reason for visit: Follow up on bacteremia  Interval History: remains intubated, WBC down to 15.4, afebrile.  No acute events.    Physical Exam: Constitutional:  Vitals:   12/22/18 1000 12/22/18 1100  BP: 119/80 122/75  Pulse:  90  Resp: 19 (!) 21  Temp:    SpO2:  100%   patient does not respond Eyes: anicteric HENT: +ET Respiratory: respiratory effort on vent Cardiovascular: tachy RR GI: soft, nt, nd MS: left fistula with no warmth  Review of Systems: Unable to be assessed due to patient factors  Lab Results  Component Value Date   WBC 15.4 (H) 12/22/2018   HGB 8.8 (L) 12/22/2018   HCT 25.7 (L) 12/22/2018   MCV 93.1 12/22/2018   PLT 54 (L) 12/22/2018    Lab Results  Component Value Date   CREATININE 12.67 (H) 12/22/2018   BUN 79 (H) 12/22/2018   NA 128 (L) 12/22/2018   K 4.8 12/22/2018   CL 94 (L) 12/22/2018   CO2 18 (L) 12/22/2018    Lab Results  Component Value Date   ALT 21 12/12/2018   AST 53 (H) 12/27/2018   ALKPHOS 102 12/11/2018     Microbiology: Recent Results (from the past 240 hour(s))  SARS Coronavirus 2 (CEPHEID- Performed in [facility] hospital lab), Hosp Order     Status: None   Collection Time: 12/27/2018 11:45 AM  Result Value Ref Range Status   SARS Coronavirus 2 NEGATIVE NEGATIVE Final    Comment: (NOTE) If result is NEGATIVE SARS-CoV-2 target nucleic acids are NOT DETECTED. The SARS-CoV-2 RNA is generally detectable in upper and lower  respiratory specimens during the acute phase of infection. The lowest  concentration of SARS-CoV-2 viral copies this assay can detect is 250  copies / mL. A negative result does not preclude SARS-CoV-2 infection  and should not be used as the sole basis for treatment or other  patient management decisions.  A negative result may occur with  improper specimen collection / handling, submission of specimen other  than nasopharyngeal swab, presence of viral  mutation(s) within the  areas targeted by this assay, and inadequate number of viral copies  (<250 copies / mL). A negative result must be combined with clinical  observations, patient history, and epidemiological information. If result is POSITIVE SARS-CoV-2 target nucleic acids are DETECTED. The SARS-CoV-2 RNA is generally detectable in upper and lower  respiratory specimens dur ing the acute phase of infection.  Positive  results are indicative of active infection with SARS-CoV-2.  Clinical  correlation with patient history and other diagnostic information is  necessary to determine patient infection status.  Positive results do  not rule out bacterial infection or co-infection with other viruses. If result is PRESUMPTIVE POSTIVE SARS-CoV-2 nucleic acids MAY BE PRESENT.   A presumptive positive result was obtained on the submitted specimen  and confirmed on repeat testing.  While 2019 novel coronavirus  (SARS-CoV-2) nucleic acids may be present in the submitted sample  additional confirmatory testing may be necessary for epidemiological  and / or clinical management purposes  to differentiate between  SARS-CoV-2 and other Sarbecovirus currently known to infect humans.  If clinically indicated additional testing with an alternate test  methodology [id number]) is advised. The SARS-CoV-2 RNA is generally  detectable in upper and lower respiratory sp ecimens during the acute  phase of infection. The expected result is Negative. Fact Sheet for Patients:  StrictlyIdeas.no Fact Sheet  for Healthcare Providers: BankingDealers.co.za This test is not yet approved or cleared by the [place] and has been authorized for detection and/or diagnosis of SARS-CoV-2 by FDA under an Emergency Use Authorization (EUA).  This EUA will remain in effect (meaning this test can be used) for the duration of the COVID-19 declaration under Section 564(b)(1)  of the Act, 21 U.S.C. section 360bbb-3(b)(1), unless the authorization is terminated or revoked sooner. Performed at [facility], [place] [address] [place], [place] [address]   Blood Culture (routine x 2)     Status: Abnormal (Preliminary result)   Collection Time: 12/19/2018  1:57 PM  Result Value Ref Range Status   Specimen Description BLOOD RIGHT FOREARM  Final   Special Requests   Final    BOTTLES DRAWN AEROBIC AND ANAEROBIC Blood Culture adequate volume   Culture  Setup Time   Final    GRAM POSITIVE COCCI IN BOTH AEROBIC AND ANAEROBIC BOTTLES CRITICAL RESULT CALLED TO, READ BACK BY AND VERIFIED WITH: J. LEDFORD,PHARMD 0209 12/21/2018 Mena Goes Performed at [facility], [place] [address] [place], [place] [address]    Culture STAPHYLOCOCCUS AUREUS (A)  Final   Report Status PENDING  Incomplete  Blood Culture (routine x 2)     Status: Abnormal (Preliminary result)   Collection Time: 12/30/2018  1:58 PM  Result Value Ref Range Status   Specimen Description BLOOD RIGHT HAND  Final   Special Requests   Final    BOTTLES DRAWN AEROBIC AND ANAEROBIC Blood Culture adequate volume   Culture  Setup Time   Final    GRAM POSITIVE COCCI IN BOTH AEROBIC AND ANAEROBIC BOTTLES CRITICAL RESULT CALLED TO, READ BACK BY AND VERIFIED WITH: J. LEDFORD,PHARMD 0209 12/21/2018 T. TYSOR    Culture (A)  Final    STAPHYLOCOCCUS AUREUS SUSCEPTIBILITIES TO FOLLOW Performed at [facility], [place] [address] [place],  [address]    Report Status PENDING  Incomplete  Blood Culture ID Panel (Reflexed)     Status: Abnormal   Collection Time: 01/08/2019  1:58 PM  Result Value Ref Range Status   Enterococcus species NOT DETECTED NOT DETECTED Final   Listeria monocytogenes NOT DETECTED NOT DETECTED Final   Staphylococcus species DETECTED (A) NOT DETECTED Final    Comment: CRITICAL RESULT CALLED TO, READ BACK BY AND VERIFIED WITH: J. LEDFORD,PHARMD 0209 12/21/2018 T. TYSOR    Staphylococcus  aureus (BCID) DETECTED (A) NOT DETECTED Final    Comment: Methicillin (oxacillin) susceptible Staphylococcus aureus (MSSA). Preferred therapy is anti staphylococcal beta lactam antibiotic (Cefazolin or Nafcillin), unless clinically contraindicated. CRITICAL RESULT CALLED TO, READ BACK BY AND VERIFIED WITH: J. LEDFORD,PHARMD 0209 12/21/2018 T. TYSOR    Methicillin resistance NOT DETECTED NOT DETECTED Final   Streptococcus species NOT DETECTED NOT DETECTED Final   Streptococcus agalactiae NOT DETECTED NOT DETECTED Final   Streptococcus pneumoniae NOT DETECTED NOT DETECTED Final   Streptococcus pyogenes NOT DETECTED NOT DETECTED Final   Acinetobacter baumannii NOT DETECTED NOT DETECTED Final   Enterobacteriaceae species NOT DETECTED NOT DETECTED Final   Enterobacter cloacae complex NOT DETECTED NOT DETECTED Final   Escherichia coli NOT DETECTED NOT DETECTED Final   Klebsiella oxytoca NOT DETECTED NOT DETECTED Final   Klebsiella pneumoniae NOT DETECTED NOT DETECTED Final   Proteus species NOT DETECTED NOT DETECTED Final   Serratia marcescens NOT DETECTED NOT DETECTED Final   Haemophilus influenzae NOT DETECTED NOT DETECTED Final   Neisseria meningitidis NOT DETECTED NOT DETECTED  Final   Pseudomonas aeruginosa NOT DETECTED NOT DETECTED Final   Candida albicans NOT DETECTED NOT DETECTED Final   Candida glabrata NOT DETECTED NOT DETECTED Final   Candida krusei NOT DETECTED NOT DETECTED Final   Candida parapsilosis NOT DETECTED NOT DETECTED Final   Candida tropicalis NOT DETECTED NOT DETECTED Final    Comment: Performed at [facility], [place] [address] [place], [place] [address]  MRSA PCR Screening     Status: None   Collection Time: 12/26/2018  3:15 PM  Result Value Ref Range Status   MRSA by PCR NEGATIVE NEGATIVE Final    Comment:        The GeneXpert MRSA Assay (FDA approved for NASAL specimens only), is one component of a comprehensive MRSA colonization surveillance program. It is  not intended to diagnose MRSA infection nor to guide or monitor treatment for MRSA infections. Performed at [facility], [place] [address], [place], [place] [address]     Impression/Plan:  1. Bacteremia - on cefazolin.  Will repeat blood cultures tomorrow.    2.  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    3.  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  Also AV thickening and calcifications.   TEE when able.

## 2018-12-23 ENCOUNTER — Inpatient Hospital Stay (HOSPITAL_COMMUNITY): Payer: Medicare Other

## 2018-12-23 DIAGNOSIS — J9601 Acute respiratory failure with hypoxia: Secondary | ICD-10-CM | POA: Diagnosis present

## 2018-12-23 DIAGNOSIS — I330 Acute and subacute infective endocarditis: Secondary | ICD-10-CM

## 2018-12-23 DIAGNOSIS — A4101 Sepsis due to Methicillin susceptible Staphylococcus aureus: Principal | ICD-10-CM

## 2018-12-23 DIAGNOSIS — I6340 Cerebral infarction due to embolism of unspecified cerebral artery: Secondary | ICD-10-CM

## 2018-12-23 LAB — RENAL FUNCTION PANEL
Albumin: 2.1 g/dL — ABNORMAL LOW (ref 3.5–5.0)
Albumin: 1.9 g/dL — ABNORMAL LOW (ref 3.5–5.0)
Anion gap: 14 (ref 5–15)
Anion gap: 13 (ref 5–15)
BUN: 46 mg/dL — ABNORMAL HIGH (ref 6–20)
BUN: 44 mg/dL — ABNORMAL HIGH (ref 6–20)
CO2: 21 mmol/L — ABNORMAL LOW (ref 22–32)
CO2: 22 mmol/L (ref 22–32)
Calcium: 8.4 mg/dL — ABNORMAL LOW (ref 8.9–10.3)
Calcium: 8.6 mg/dL — ABNORMAL LOW (ref 8.9–10.3)
Chloride: 99 mmol/L (ref 98–111)
Chloride: 100 mmol/L (ref 98–111)
Creatinine, Ser: 6.45 mg/dL — ABNORMAL HIGH (ref 0.61–1.24)
Creatinine, Ser: 5.44 mg/dL — ABNORMAL HIGH (ref 0.61–1.24)
GFR calc Af Amer: 10 mL/min — ABNORMAL LOW (ref >60)
GFR calc Af Amer: 13 mL/min — ABNORMAL LOW (ref >60)
GFR calc non Af Amer: 9 mL/min — ABNORMAL LOW (ref >60)
GFR calc non Af Amer: 11 mL/min — ABNORMAL LOW (ref >60)
Glucose, Bld: 123 mg/dL — ABNORMAL HIGH (ref 70–99)
Glucose, Bld: 138 mg/dL — ABNORMAL HIGH (ref 70–99)
Phosphorus: 3.1 mg/dL (ref 2.5–4.6)
Phosphorus: 2.4 mg/dL — ABNORMAL LOW (ref 2.5–4.6)
Potassium: 4.2 mmol/L (ref 3.5–5.1)
Potassium: 4.1 mmol/L (ref 3.5–5.1)
Sodium: 134 mmol/L — ABNORMAL LOW (ref 135–145)
Sodium: 135 mmol/L (ref 135–145)

## 2018-12-23 LAB — CBC
HCT: 25.8 % — ABNORMAL LOW (ref 39.0–52.0)
Hemoglobin: 9 g/dL — ABNORMAL LOW (ref 13.0–17.0)
MCH: 31.9 pg (ref 26.0–34.0)
MCHC: 34.9 g/dL (ref 30.0–36.0)
MCV: 91.5 fL (ref 80.0–100.0)
Platelets: 67 10*3/uL — ABNORMAL LOW (ref 150–400)
RBC: 2.82 MIL/uL — ABNORMAL LOW (ref 4.22–5.81)
RDW: 14.6 % (ref 11.5–15.5)
WBC: 18.7 10*3/uL — ABNORMAL HIGH (ref 4.0–10.5)
nRBC: 0 % (ref 0.0–0.2)

## 2018-12-23 LAB — MAGNESIUM
Magnesium: 2.2 mg/dL (ref 1.7–2.4)
Magnesium: 2.5 mg/dL — ABNORMAL HIGH (ref 1.7–2.4)

## 2018-12-23 LAB — CULTURE, BLOOD (ROUTINE X 2)
Special Requests: ADEQUATE
Special Requests: ADEQUATE

## 2018-12-23 LAB — GLUCOSE, CAPILLARY
Glucose-Capillary: 104 mg/dL — ABNORMAL HIGH (ref 70–99)
Glucose-Capillary: 85 mg/dL (ref 70–99)
Glucose-Capillary: 117 mg/dL — ABNORMAL HIGH (ref 70–99)
Glucose-Capillary: 141 mg/dL — ABNORMAL HIGH (ref 70–99)
Glucose-Capillary: 111 mg/dL — ABNORMAL HIGH (ref 70–99)

## 2018-12-23 LAB — TRIGLYCERIDES: Triglycerides: 918 mg/dL — ABNORMAL HIGH (ref <150)

## 2018-12-23 LAB — PHOSPHORUS
Phosphorus: 3.1 mg/dL (ref 2.5–4.6)
Phosphorus: 2.4 mg/dL — ABNORMAL LOW (ref 2.5–4.6)

## 2018-12-23 MED ORDER — SODIUM CHLORIDE 0.9 % IV SOLN
INTRAVENOUS | Status: DC | PRN
  Administered 2018-12-23: 250 mL via INTRAVENOUS
  Administered 2018-12-26: 18:00:00 500 mL via INTRAVENOUS
  Administered 2018-12-30: 250 mL via INTRAVENOUS

## 2018-12-23 MED ORDER — AMIODARONE HCL 200 MG PO TABS
200.0000 mg | ORAL_TABLET | Freq: Two times a day (BID) | ORAL | Status: DC
  Administered 2018-12-23 – 2018-12-31 (×17): 200 mg
  Filled 2018-12-23 (×17): qty 1

## 2018-12-23 MED ORDER — SODIUM CHLORIDE 0.9% FLUSH: 10.0000 mL | INTRAVENOUS | Status: DC | PRN

## 2018-12-23 MED ORDER — CHLORHEXIDINE GLUCONATE CLOTH 2 % EX PADS
6.0000 | MEDICATED_PAD | Freq: Every day | CUTANEOUS | Status: DC
  Administered 2018-12-24 – 2018-12-29 (×4): 6 via TOPICAL

## 2018-12-23 MED ORDER — CHLORHEXIDINE GLUCONATE CLOTH 2 % EX PADS: 6.0000 | MEDICATED_PAD | Freq: Every day | CUTANEOUS | Status: DC

## 2018-12-23 MED ORDER — SODIUM CHLORIDE 0.9 % IV SOLN
2.0000 g | INTRAVENOUS | Status: DC
  Administered 2018-12-23 – 2018-12-26 (×16): 2 g via INTRAVENOUS
  Filled 2018-12-23 (×22): qty 2000

## 2018-12-23 MED ORDER — SODIUM CHLORIDE 0.9% FLUSH
10.0000 mL | Freq: Two times a day (BID) | INTRAVENOUS | Status: DC
  Administered 2018-12-23 – 2018-12-25 (×6): 10 mL
  Administered 2018-12-26: 10:00:00 40 mL
  Administered 2018-12-27 – 2018-12-31 (×8): 10 mL

## 2018-12-23 NOTE — Progress Notes (Signed)
Spoke with RN Estill Bamberg about placing a PIV for the patient. Patient has had 3 PIV's and 2 out of the 3 are not working and the 3rd one possibly maybe infiltrated. Patient still has several IV medications that is needed to administer including drips. RN states patient may need more sedation medication. I recommended a central line due to the patient's limited access and restrictions of the left arm. RN stated she would consult critical care for the patient.

## 2018-12-23 NOTE — Progress Notes (Signed)
[facility] for Infectious Disease   Reason for visit: Follow up on bacteremia  Interval History: remains intubated, WBC 18.7, afebrile.  No acute events. Weaning off the vent some.     Physical Exam: Constitutional:  Vitals:   12/23/18 0800 12/23/18 0900  BP: (!) 113/93   Pulse: 88   Resp: (!) 21 (!) 22  Temp: (!) 97.3 F (36.3 C) (!) 97.3 F (36.3 C)  SpO2: 99%    patient does not respond Eyes: anicteric HENT: +ET Respiratory: respiratory effort on vent Cardiovascular: tachy RR GI: soft, nt, nd MS: left fistula with no warmth  Review of Systems: Unable to be assessed due to patient factors  Lab Results  Component Value Date   WBC 18.7 (H) 12/23/2018   HGB 9.0 (L) 12/23/2018   HCT 25.8 (L) 12/23/2018   MCV 91.5 12/23/2018   PLT 67 (L) 12/23/2018    Lab Results  Component Value Date   CREATININE 6.45 (H) 12/23/2018   BUN 46 (H) 12/23/2018   NA 134 (L) 12/23/2018   K 4.2 12/23/2018   CL 99 12/23/2018   CO2 21 (L) 12/23/2018    Lab Results  Component Value Date   ALT 21 12/27/2018   AST 53 (H) 12/28/2018   ALKPHOS 102 12/30/2018     Microbiology: Recent Results (from the past 240 hour(s))  SARS Coronavirus 2 (CEPHEID- Performed in [facility] hospital lab), Hosp Order     Status: None   Collection Time: 12/30/2018 11:45 AM  Result Value Ref Range Status   SARS Coronavirus 2 NEGATIVE NEGATIVE Final    Comment: (NOTE) If result is NEGATIVE SARS-CoV-2 target nucleic acids are NOT DETECTED. The SARS-CoV-2 RNA is generally detectable in upper and lower  respiratory specimens during the acute phase of infection. The lowest  concentration of SARS-CoV-2 viral copies this assay can detect is 250  copies / mL. A negative result does not preclude SARS-CoV-2 infection  and should not be used as the sole basis for treatment or other  patient management decisions.  A negative result may occur with  improper specimen collection / handling, submission of  specimen other  than nasopharyngeal swab, presence of viral mutation(s) within the  areas targeted by this assay, and inadequate number of viral copies  (<250 copies / mL). A negative result must be combined with clinical  observations, patient history, and epidemiological information. If result is POSITIVE SARS-CoV-2 target nucleic acids are DETECTED. The SARS-CoV-2 RNA is generally detectable in upper and lower  respiratory specimens dur ing the acute phase of infection.  Positive  results are indicative of active infection with SARS-CoV-2.  Clinical  correlation with patient history and other diagnostic information is  necessary to determine patient infection status.  Positive results do  not rule out bacterial infection or co-infection with other viruses. If result is PRESUMPTIVE POSTIVE SARS-CoV-2 nucleic acids MAY BE PRESENT.   A presumptive positive result was obtained on the submitted specimen  and confirmed on repeat testing.  While 2019 novel coronavirus  (SARS-CoV-2) nucleic acids may be present in the submitted sample  additional confirmatory testing may be necessary for epidemiological  and / or clinical management purposes  to differentiate between  SARS-CoV-2 and other Sarbecovirus currently known to infect humans.  If clinically indicated additional testing with an alternate test  methodology [phone] is advised. The SARS-CoV-2 RNA is generally  detectable in upper and lower respiratory sp ecimens during the acute  phase of infection.  The expected result is Negative. Fact Sheet for Patients:  StrictlyIdeas.no Fact Sheet for Healthcare Providers: BankingDealers.co.za This test is not yet approved or cleared by the [place] FDA and has been authorized for detection and/or diagnosis of SARS-CoV-2 by FDA under an Emergency Use Authorization (EUA).  This EUA will remain in effect (meaning this test can be used) for the  duration of the COVID-19 declaration under Section 564(b)(1) of the Act, 21 U.S.C. section 360bbb-3(b)(1), unless the authorization is terminated or revoked sooner. Performed at [facility], [place] [address], [place], [place] [address]   Blood Culture (routine x 2)     Status: Abnormal   Collection Time: 12/18/2018  1:57 PM  Result Value Ref Range Status   Specimen Description BLOOD RIGHT FOREARM  Final   Special Requests   Final    BOTTLES DRAWN AEROBIC AND ANAEROBIC Blood Culture adequate volume   Culture  Setup Time   Final    GRAM POSITIVE COCCI IN BOTH AEROBIC AND ANAEROBIC BOTTLES CRITICAL RESULT CALLED TO, READ BACK BY AND VERIFIED WITH: J. LEDFORD,PHARMD 1017 12/21/2018 T. TYSOR    Culture (A)  Final    STAPHYLOCOCCUS AUREUS SUSCEPTIBILITIES PERFORMED ON PREVIOUS CULTURE WITHIN THE LAST 5 DAYS. Performed at [facility], [place] [address], [place], [place] [address]    Report Status 12/23/2018 FINAL  Final  Blood Culture (routine x 2)     Status: Abnormal   Collection Time: 01/10/2019  1:58 PM  Result Value Ref Range Status   Specimen Description BLOOD RIGHT HAND  Final   Special Requests   Final    BOTTLES DRAWN AEROBIC AND ANAEROBIC Blood Culture adequate volume   Culture  Setup Time   Final    GRAM POSITIVE COCCI IN BOTH AEROBIC AND ANAEROBIC BOTTLES CRITICAL RESULT CALLED TO, READ BACK BY AND VERIFIED WITH: J. LEDFORD,PHARMD 0209 12/21/2018 Mena Goes Performed at [facility], [place] [address], [place], [place] [address]    Culture STAPHYLOCOCCUS AUREUS (A)  Final   Report Status 12/23/2018 FINAL  Final   Organism ID, Bacteria STAPHYLOCOCCUS AUREUS  Final      Susceptibility   Staphylococcus aureus - MIC*    CIPROFLOXACIN <=0.5 SENSITIVE Sensitive     ERYTHROMYCIN <=0.25 SENSITIVE Sensitive     GENTAMICIN <=0.5 SENSITIVE Sensitive     OXACILLIN <=0.25 SENSITIVE Sensitive     TETRACYCLINE <=1 SENSITIVE Sensitive     VANCOMYCIN <=0.5 SENSITIVE Sensitive      TRIMETH/SULFA <=10 SENSITIVE Sensitive     CLINDAMYCIN <=0.25 SENSITIVE Sensitive     RIFAMPIN <=0.5 SENSITIVE Sensitive     Inducible Clindamycin NEGATIVE Sensitive     * STAPHYLOCOCCUS AUREUS  Blood Culture ID Panel (Reflexed)     Status: Abnormal   Collection Time: 12/25/2018  1:58 PM  Result Value Ref Range Status   Enterococcus species NOT DETECTED NOT DETECTED Final   Listeria monocytogenes NOT DETECTED NOT DETECTED Final   Staphylococcus species DETECTED (A) NOT DETECTED Final    Comment: CRITICAL RESULT CALLED TO, READ BACK BY AND VERIFIED WITH: J. LEDFORD,PHARMD 0209 12/21/2018 T. TYSOR    Staphylococcus aureus (BCID) DETECTED (A) NOT DETECTED Final    Comment: Methicillin (oxacillin) susceptible Staphylococcus aureus (MSSA). Preferred therapy is anti staphylococcal beta lactam antibiotic (Cefazolin or Nafcillin), unless clinically contraindicated. CRITICAL RESULT CALLED TO, READ BACK BY AND VERIFIED WITH: J. LEDFORD,PHARMD 0209 12/21/2018 T. TYSOR    Methicillin resistance NOT DETECTED NOT DETECTED Final  Streptococcus species NOT DETECTED NOT DETECTED Final   Streptococcus agalactiae NOT DETECTED NOT DETECTED Final   Streptococcus pneumoniae NOT DETECTED NOT DETECTED Final   Streptococcus pyogenes NOT DETECTED NOT DETECTED Final   Acinetobacter baumannii NOT DETECTED NOT DETECTED Final   Enterobacteriaceae species NOT DETECTED NOT DETECTED Final   Enterobacter cloacae complex NOT DETECTED NOT DETECTED Final   Escherichia coli NOT DETECTED NOT DETECTED Final   Klebsiella oxytoca NOT DETECTED NOT DETECTED Final   Klebsiella pneumoniae NOT DETECTED NOT DETECTED Final   Proteus species NOT DETECTED NOT DETECTED Final   Serratia marcescens NOT DETECTED NOT DETECTED Final   Haemophilus influenzae NOT DETECTED NOT DETECTED Final   Neisseria meningitidis NOT DETECTED NOT DETECTED Final   Pseudomonas aeruginosa NOT DETECTED NOT DETECTED Final   Candida albicans NOT DETECTED  NOT DETECTED Final   Candida glabrata NOT DETECTED NOT DETECTED Final   Candida krusei NOT DETECTED NOT DETECTED Final   Candida parapsilosis NOT DETECTED NOT DETECTED Final   Candida tropicalis NOT DETECTED NOT DETECTED Final    Comment: Performed at [facility], [place] [address] [place], [place] [address]  MRSA PCR Screening     Status: None   Collection Time: 01/05/2019  3:15 PM  Result Value Ref Range Status   MRSA by PCR NEGATIVE NEGATIVE Final    Comment:        The GeneXpert MRSA Assay (FDA approved for NASAL specimens only), is one component of a comprehensive MRSA colonization surveillance program. It is not intended to diagnose MRSA infection nor to guide or monitor treatment for MRSA infections. Performed at [facility], [place] [address] [place], [place] [address]   Urine Culture     Status: None   Collection Time: 12/21/18 11:04 PM  Result Value Ref Range Status   Specimen Description URINE, CATHETERIZED  Final   Special Requests NONE  Final   Culture   Final    NO GROWTH Performed at [facility], [address], [place], [place] [address]    Report Status 12/22/2018 FINAL  Final    Impression/Plan:  1. Bacteremia - on cefazolin.  Repeat blood cultures sent. No changes.   2.  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    3.  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  Also AV thickening and calcifications.   TEE when able.

## 2018-12-23 NOTE — Progress Notes (Signed)
NAME:  Thomas Adams, MRN:  [id number], DOB:  [birth year], LOS: 3 ADMISSION DATE:  12/21/2018, CONSULTATION DATE:  12/23/18 REFERRING MD:  Dr. Germaine Pomfret , CHIEF COMPLAINT:  Left side weakness   Brief History   58 yr old with history of HTN, afib, febrile and coughing last night, found with left facial droop, left sided weakness this morning. 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 Brought to ICU intubated  Past Medical History  HTN ESRD, dialysis TThSat Afib w/ RVR  Significant Hospital Events   5/10 Angiogram in IR >> no clot retraction due to distal location of thrombus; hypotension from meds 5/11 d/c diprivan due to triglyceride levels 5/12 start CRRT; start amiodarone  Consults:  Nephrology  ID Cardiology  Procedures:  ETT 5/10 >>  Rt radial aline 5/10 >>  Rt IJ HD cath 5/11 >>   Significant Diagnostic Tests:  CT angio head/neck 5/10 >> Rt M2 occlusion, severe Lt MCA stenosis Echo 5/10 >> EF greater than 65%, severe LCH, severe MR MRI brain 5/11 >> multifocal acute ischemia scattered in both cerebral and cerebellar hemispheres CXR 5/13> L lung opacity  Micro Data:  COVID 5/10 >> negative Blood 5/10 >> Staph aureus  Antimicrobials:  Cefepime 5/10 >> 5/11 Vancomycin 5/10 >> 5/11 Ancef 5/11 >>  Subjective:  Off sedation since 0800  Remains intubated. Appropriate oxygenation, Vt on PSV/CPAP however mental status remains great barrier to moving forward with possible extubation.   Objective   Blood pressure (!) 113/93, pulse 88, temperature (!) 97.3 F (36.3 C), temperature source Esophageal, resp. rate (!) 21, height 5\' 9"  (1.753 m), weight 85.3 kg, SpO2 99 %. CVP:  [1 mmHg-17 mmHg] 15 mmHg  Vent Mode: PRVC FiO2 (%):  [35 %] 35 % Set Rate:  [14 bmp-22 bmp] 22 bmp Vt Set:  [560 mL] 560 mL PEEP:  [5 cmH20] 5 cmH20 Pressure Support:  [5 cmH20] 5 cmH20 Plateau Pressure:  [15 cmH20-20 cmH20] 19 cmH20    Intake/Output Summary (Last 24 hours) at 12/23/2018 0912 Last data filed at 12/23/2018 0800 Gross per 24 hour  Intake 1527.44 ml  Output 1650 ml  Net -122.56 ml   Filed Weights   12/30/2018 1100 12/23/2018 1138 01/06/2019 1500  Weight: 87.2 kg 87.2 kg 85.3 kg    Examination: General - adult male, intubated, NAD  HEENT: NCAT, ETT secure, trachea midline  Cardiac - IRIR, s1s2, no JVD  Chest - bilateral rhochi. No accessory muscle recruitment Abdomen - Soft, round, ndnt, bowel sounds x4  Extremities - No obvious deformity. Bilateral pedal edema. No clubbing no cyanosis  Skin - clean dry warm no rash  Neuro - Off sedation however remains very somnolent. Does not follow commands.    Resolved Hospital Problem list     Assessment & Plan:   Acute respiratory failure due to compromised airway in setting of CVA L lung opacity  Plan - Weans well on PSV/CPAP, however too somnolent to move forward with extubation trial.  - May be candidate for trach  - AM CXR  - WUA/SBT qAM   Sepsis with Staph aureus bacteremia in setting of new severe MR. Plan - goal MAPS  > 65, titrate NE PRN for goal   - Continue ancef per ID  - Continue to rend WBC   CVA. Plan - per neurology  ESRD. Hyponatremia. Non gap metabolic acidosis. Plan -nephrology following -CRRT   Elevated triglycerides on propofol  Plan -  f/u triglyceride - check lipase  Anemia of critical illness and chronic disease. Thrombocytopenia in setting of sepsis. Plan - trend CBC  - transfuse for Hb < 7, PLT < 10K  Possible endocarditis Severe LVH Aortic valve calcification Mitral regurg  Atrial Fibrillation  -consult cardiology for Afib, new mitral regurg, eval for possible endocarditis given staph bacteremia  - will likely need TEE -amio gtt   Best practice:  Diet: NPO DVT prophylaxis: SCDs GI prophylaxis: protonix Mobility: bed rest Code Status: full Disposition: ICU  Labs    CMP Latest Ref Rng & Units  12/23/2018 12/22/2018 12/22/2018  Glucose 70 - 99 mg/dL 123(H) 100(H) 94  BUN 6 - 20 mg/dL 46(H) 61(H) 79(H)  Creatinine 0.61 - 1.24 mg/dL 6.45(H) 9.12(H) 12.67(H)  Sodium 135 - 145 mmol/L 134(L) 132(L) 128(L)  Potassium 3.5 - 5.1 mmol/L 4.2 4.2 4.8  Chloride 98 - 111 mmol/L 99 99 94(L)  CO2 22 - 32 mmol/L 21(L) 19(L) 18(L)  Calcium 8.9 - 10.3 mg/dL 8.4(L) 8.4(L) 8.3(L)  Total Protein 6.5 - 8.1 g/dL - - -  Total Bilirubin 0.3 - 1.2 mg/dL - - -  Alkaline Phos 38 - 126 U/L - - -  AST 15 - 41 U/L - - -  ALT 0 - 44 U/L - - -   CBC Latest Ref Rng & Units 12/23/2018 12/22/2018 12/30/2018  WBC 4.0 - 10.5 K/uL 18.7(H) 15.4(H) 20.2(H)  Hemoglobin 13.0 - 17.0 g/dL 9.0(L) 8.8(L) 11.0(L)  Hematocrit 39.0 - 52.0 % 25.8(L) 25.7(L) 31.6(L)  Platelets 150 - 400 K/uL 67(L) 54(L) 75(L)   ABG    Component Value Date/Time   PHART 7.462 (H) 12/18/2018 1653   PCO2ART 38.4 01/10/2019 1653   PO2ART 129 (H) 01/07/2019 1653   HCO3 26.8 12/24/2018 1653   TCO2 22 03/04/2016 1206   O2SAT 98.2 12/16/2018 1653   CBG (last 3)  Recent Labs    12/22/18 2332 12/23/18 0336 12/23/18 0754  GLUCAP 114* 104* 85    CC time 40 minutes   Eliseo Gum MSN, AGACNP-BC [place] [phone] If no answer, [phone] 12/23/2018, 9:13 AM

## 2018-12-23 NOTE — Consult Note (Signed)
Cardiology Consultation:   Patient ID: Thomas Adams MRN: [id number]; DOB: Mar 21, 1961  Admit date: 12/12/2018 Date of Consult: 12/23/2018  Primary Care Provider: Helen Hashimoto., MD Primary Cardiologist: Jenne Campus, MD  Primary Electrophysiologist:  None    Patient Profile:   Thomas Adams is a 58 y.o. 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 Leonie Man.  History of Present Illness:   Thomas Adams is a 58 yo male with PMH noted above. He was most recently referred to cardiology back in January for preop work up in regards to possible renal transplant through [facility]. He has previously had stress testing done back in 2019 per notes. Plan was to arrange for outpatient stress testing and echo. Unable to complete stress testing 2/2 to outbreak of COVID but echo completed on 09/24/18 showed normal EF with no reported rWMA. Mild aortic valve stenosis.   History is obtained from the chart as patient is intubated/sedated.   He presented to the ED on 12/11/2018 with left sided weakness, facial droop and dysarthria. CODE STROKE called in the field.  His LKN was 8pm the night prior. CTA in the ED showed mid M2 occlusion on the right. He was felt to be a IR candidate. He was intubated in the ED and taken to IR. Underwent angiogram with Dr. Rosann Auerbach but unable to access occlusion to thrombectomy. Of note, was not a tPA candidate as he was out of the window. COVID negative on admission. Labs on admission notable for WBC 20.2, Hgb 11, lactic acid 3.3, Na+ 128. Temp max in the ED 103. EKG in the ED showed ST with LVH. CXR negative. Admitted to neuro ICU for further management. BC + for MSSA. Placed on presser support. ID consulted.   Echo this admission showed EF of 65% with LVH, mildly dilated RA, moderately dilated LA, severe MR via doppler flow. Also noted severe posterior mitral calcifications extending to leaflets with  mobile large calcification which is partially detached. Possible source of embolism/stroke.  Notes indicate he developed Afib RVR during admission. Placed on amiodarone.   Past Medical History:  Diagnosis Date   CKD (chronic kidney disease), stage III ([place])    Complication of anesthesia    " i HAD A HARD TIME WAKING WHEN THEY PUT MY FISTULA IN "   Hypertension    Renal insufficiency    dialysis 08/2013, tuesday/thursday/saturday    Past Surgical History:  Procedure Laterality Date   AV FISTULA PLACEMENT Left 09/22/2013   Procedure: ARTERIOVENOUS (AV) FISTULA CREATION RADIOCEPHALIC;  Surgeon: Elam Dutch, MD;  Location: [place];  Service: Vascular;  Laterality: Left;   DIALYSIS FISTULA CREATION     INSERTION OF DIALYSIS CATHETER Right 09/22/2013   Procedure: INSERTION OF DIALYSIS CATHETER; ULTRASOUND GUIDED;  Surgeon: Elam Dutch, MD;  Location: [place];  Service: Vascular;  Laterality: Right;   IR GENERIC HISTORICAL  12/14/2015   IR RADIOLOGIST EVAL & MGMT 12/14/2015 Aletta Edouard, MD [facility] INTERV RAD   RADIOLOGY WITH ANESTHESIA N/A 01/02/2019   Procedure: RADIOLOGY WITH ANESTHESIA;  Surgeon: Luanne Bras, MD;  Location: [place];  Service: Radiology;  Laterality: N/A;   REMOVAL OF A DIALYSIS CATHETER Right 09/22/2013   Procedure: REMOVAL OF A DIALYSIS CATHETER OF TEMPORARY RIGHT INTERNAL JUGULAR ;  Surgeon: Elam Dutch, MD;  Location: [place];  Service: Vascular;  Laterality: Right;     Home  Medications:  Prior to Admission medications   Medication Sig Start Date End Date Taking? Authorizing Provider  amLODipine (NORVASC) 10 MG tablet Take 1 tablet (10 mg total) by mouth daily. Patient taking differently: Take 10 mg by mouth at bedtime.  03/05/16   Burns, Arloa Koh, MD  hydrALAZINE (APRESOLINE) 25 MG tablet Take 1 tablet (25 mg total) by mouth every 8 (eight) hours. Patient taking differently: Take 25 mg by mouth See admin instructions. Take one tablet (25 mg) by mouth  twice daily as directed 09/23/13   Delfina Redwood, MD  labetalol (NORMODYNE) 100 MG tablet Take 100 mg by mouth 2 (two) times a day.  11/29/18   [provider]  lisinopril (ZESTRIL) 40 MG tablet Take 40 mg by mouth See admin instructions. Take one tablet (40 mg) by mouth every morning or after dialysis 12/18/15   [provider]  promethazine (PHENERGAN) 12.5 MG tablet Take 12.5 mg by mouth daily as needed for nausea or vomiting.    [provider]  sevelamer carbonate (RENVELA) 800 MG tablet Take 2,400 mg by mouth 3 (three) times daily.    [provider]    Inpatient Medications: Scheduled Meds:  amiodarone  200 mg Per Tube BID   aspirin  81 mg Per Tube Daily   chlorhexidine gluconate (MEDLINE KIT)  15 mL Mouth Rinse BID   feeding supplement (PRO-STAT SUGAR FREE 64)  60 mL Per Tube QID   mouth rinse  15 mL Mouth Rinse 10 times per day   pantoprazole sodium  40 mg Per Tube Q24H   sodium chloride flush  3 mL Intravenous Once   Continuous Infusions:   prismasol BGK 4/2.5 300 mL/hr at 12/23/18 0159    prismasol BGK 4/2.5 300 mL/hr at 12/23/18 0220   sodium chloride Stopped (12/23/18 1146)    ceFAZolin (ANCEF) IV Stopped (12/23/18 1114)   dexmedetomidine (PRECEDEX) IV infusion Stopped (12/22/18 1520)   feeding supplement (VITAL AF 1.2 CAL) 1,000 mL (12/22/18 1519)   fentaNYL infusion INTRAVENOUS 50 mcg/hr (12/21/18 0452)   norepinephrine (LEVOPHED) Adult infusion Stopped (12/23/18 0714)   prismasol BGK 4/2.5 1,800 mL/hr at 12/23/18 1119   PRN Meds: sodium chloride, acetaminophen **OR** acetaminophen (TYLENOL) oral liquid 160 mg/5 mL **OR** acetaminophen, albuterol, fentaNYL (SUBLIMAZE) injection, heparin, ipratropium, midazolam, senna-docusate  Allergies:    Allergies  Allergen Reactions   Penicillins Nausea Only, Rash and Other (See Comments)    also dizziness    Social History:   Social History   Socioeconomic History    Marital status: Divorced    Spouse name: Not on file   Number of children: Not on file   Years of education: Not on file   Highest education level: Not on file  Occupational History   Not on file  Social Needs   Financial resource strain: Not on file   Food insecurity:    Worry: Not on file    Inability: Not on file   Transportation needs:    Medical: Not on file    Non-medical: Not on file  Tobacco Use   Smoking status: Never Smoker   Smokeless tobacco: Never Used  Substance and Sexual Activity   Alcohol use: No   Drug use: No   Sexual activity: Not on file  Lifestyle   Physical activity:    Days per week: Not on file    Minutes per session: Not on file   Stress: Not on file  Relationships   Social connections:  Talks on phone: Not on file    Gets together: Not on file    Attends religious service: Not on file    Active member of club or organization: Not on file    Attends meetings of clubs or organizations: Not on file    Relationship status: Not on file   Intimate partner violence:    Fear of current or ex partner: Not on file    Emotionally abused: Not on file    Physically abused: Not on file    Forced sexual activity: Not on file  Other Topics Concern   Not on file  Social History Narrative   Not on file    Family History:    Family History  Problem Relation Age of Onset   Hypertension Father      ROS:  Please see the history of present illness.   Information obtained from chart as patient is intubated and sedated.    Physical Exam/Data:   Vitals:   12/23/18 1000 12/23/18 1100 12/23/18 1128 12/23/18 1200  BP: 115/75   104/68  Pulse:      Resp: (!) '22 20  19  ' Temp: (!) 97.5 F (36.4 C) (!) 97.2 F (36.2 C)  (!) 96.8 F (36 C)  TempSrc:      SpO2:   100% 100%  Weight:      Height:        Intake/Output Summary (Last 24 hours) at 12/23/2018 1255 Last data filed at 12/23/2018 1200 Gross per 24 hour  Intake 1595.87 ml    Output 1632 ml  Net -36.13 ml   Last 3 Weights 12/25/2018 12/12/2018 01/08/2019  Weight (lbs) 188 lb 0.8 oz 192 lb 3.9 oz 192 lb 3.9 oz  Weight (kg) 85.3 kg 87.2 kg 87.2 kg     Body mass index is 27.77 kg/m.   General:  Intubated, sedated. HEENT: normal Lymph: no adenopathy Neck: no JVD Endocrine:  No thryomegaly Vascular: No carotid bruits; FA pulses 2+ bilaterally without bruits  Cardiac:  normal S1, S2; irregularly irregular rhythm, rate in the 80s-90s; 2/6 HSM Lungs:  Ventilated lung sounds, diffusely coarse anteriorly and laterally Abd: soft, nontender, no hepatomegaly  Ext: no edema Musculoskeletal:  Wiggled toes independently when I uncovered them, but sedated, so unable to do full assessment Skin: warm and dry  Neuro:  sedated Psych:  sedated  EKG:  The EKG was personally reviewed and demonstrates:  ST with LVH Telemetry:  Telemetry was personally reviewed and demonstrates:  Atrial fibrillation, now with rates in the 80s-90s.  Relevant CV Studies:  TTE: 12/13/2018  IMPRESSIONS    1. The left ventricle has hyperdynamic systolic function, with an ejection fraction of >65%. The cavity size was normal. There is severe concentric left ventricular hypertrophy. Left ventricular diastolic Doppler parameters are consistent with impaired  relaxation. Elevated left atrial and left ventricular end-diastolic pressures.  2. The right ventricle has normal systolic function. The cavity was normal. There is no increase in right ventricular wall thickness.  3. Left atrial size was moderately dilated.  4. Right atrial size was mildly dilated.  5. The mitral valve is degenerative. Severe thickening of the mitral valve leaflet. Severe calcification of the mitral valve leaflet. There is severe mitral annular calcification present. Mitral valve regurgitation is severe by color flow Doppler.  6. The aortic valve is tricuspid. Severely thickening of the aortic valve. Severe calcifcation of the  aortic valve. Aortic valve regurgitation was not assessed by color flow Doppler.  SUMMARY   Severe concentric LVH, hyperdynamic LVEF. Aortic valve is severely thickened and calcified. There are severe posterior mitral annular calcifications extending into the mitral lefalets with a mobile portion that most probably represents a large calcified mass partially detached from the leaflet. This can possibly be a source of embolism and stroke. Evaluation for TTR amyloidosis with PYP nuclear scan should be considered.  Laboratory Data:  Chemistry Recent Labs  Lab 12/22/18 0537 12/22/18 1600 12/23/18 0524  NA 128* 132* 134*  K 4.8 4.2 4.2  CL 94* 99 99  CO2 18* 19* 21*  GLUCOSE 94 100* 123*  BUN 79* 61* 46*  CREATININE 12.67* 9.12* 6.45*  CALCIUM 8.3* 8.4* 8.4*  GFRNONAA 4* 6* 9*  GFRAA 5* 7* 10*  ANIONGAP 16* 14 14    Recent Labs  Lab 12/19/2018 1139 12/22/18 0537 12/22/18 1600 12/23/18 0524  PROT 6.8  --   --   --   ALBUMIN 3.3* 2.0* 2.0* 2.1*  AST 53*  --   --   --   ALT 21  --   --   --   ALKPHOS 102  --   --   --   BILITOT 1.3*  --   --   --    Hematology Recent Labs  Lab 01/07/2019 1139 12/22/18 0537 12/23/18 0524  WBC 20.2* 15.4* 18.7*  RBC 3.43* 2.76* 2.82*  HGB 11.0* 8.8* 9.0*  HCT 31.6* 25.7* 25.8*  MCV 92.1 93.1 91.5  MCH 32.1 31.9 31.9  MCHC 34.8 34.2 34.9  RDW 14.8 14.8 14.6  PLT 75* 54* 67*   Cardiac EnzymesNo results for input(s): TROPONINI in the last 168 hours. No results for input(s): TROPIPOC in the last 168 hours.  BNPNo results for input(s): BNP, PROBNP in the last 168 hours.  DDimer No results for input(s): DDIMER in the last 168 hours.  Radiology/Studies:  Ct Angio Head W Or Wo Contrast  Result Date: 01/05/2019 CLINICAL DATA:  Left-sided weakness, facial droop, and slurred speech. EXAM: CT ANGIOGRAPHY HEAD AND NECK CT PERFUSION BRAIN TECHNIQUE: Multidetector CT imaging of the head and neck was performed using the standard protocol  during bolus administration of intravenous contrast. Multiplanar CT image reconstructions and MIPs were obtained to evaluate the vascular anatomy. Carotid stenosis measurements (when applicable) are obtained utilizing NASCET criteria, using the distal internal carotid diameter as the denominator. Multiphase CT imaging of the brain was performed following IV bolus contrast injection. Subsequent parametric perfusion maps were calculated using RAPID software. CONTRAST:  146m OMNIPAQUE IOHEXOL 350 MG/ML SOLN COMPARISON:  None. FINDINGS: CTA NECK FINDINGS The study is motion degraded including moderate to severe motion artifact through the larynx which limits assessment of the carotid bifurcations. Aortic arch: Standard 3 vessel aortic arch with mild atherosclerotic plaque. No significant arch vessel origin stenosis. Right carotid system: Patent with moderate calcified atherosclerosis about the carotid bifurcation. No evidence of dissection or significant stenosis within limitations of motion artifact. Left carotid system: Patent with mild calcified atherosclerosis about the carotid bifurcation without evidence of significant stenosis or dissection. Vertebral arteries: Patent with the left being mildly dominant. Calcified atherosclerosis at the vertebral artery origins results in moderate to severe stenosis bilaterally. Skeleton: Mild cervical disc degeneration. 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 Upper chest: Clear lung apices. Review of the MIP images confirms the above findings CTA HEAD FINDINGS Anterior circulation: The internal carotid arteries are patent from skull base to carotid termini with calcified atherosclerosis  resulting in mild cavernous stenosis on the right. The right M1 segment is widely patent. Branch vessel assessment is limited by motion artifact, however there is a suspected mid right M2 branch vessel occlusion. The left MCA appears duplicated with suggestion of  severe stenosis of the more superior origin although motion artifact may be contributing to this appearance. ACAs are patent without evidence of flow limiting proximal stenosis. No aneurysm is identified. Posterior circulation: The intracranial vertebral arteries are patent to the basilar without evidence of significant stenosis. P ICAs and a ICAs are not well evaluated. SCA is are patent. The basilar artery is widely patent. There is a small right posterior communicating artery. PCAs are patent without evidence of flow limiting proximal stenosis allowing for suspected artifactual narrowing in the proximal right P2 segment. No aneurysm is identified. Venous sinuses: Patent. Anatomic variants: None. Review of the MIP images confirms the above findings CT Brain Perfusion Findings: ASPECTS: 10 CBF (<30%) Volume: 534m Perfusion (Tmax>6.0s) volume: 9334mMismatch Volume: 34m24mnfarction Location: No core infarct by standard RAPID criteria. Penumbra anterolaterally in the right parietal lobe. IMPRESSION: 1. Motion degraded examination with limited assessment of the small and medium-sized intracranial arteries. 2. Suspected mid right M2 branch vessel occlusion with 9 mL penumbra in the right parietal lobe and no core infarct based on CTP. 3. No more proximal large vessel occlusion. 4. Patent cervical carotid arteries without evidence of significant stenosis. 5. 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 6. Possible severe left MCA origin stenosis. 7.  Aortic Atherosclerosis (ICD10-I70.0). These results were called by telephone at the time of interpretation on 12/13/2018 at 12:29 pm to Dr. ERIKerney Elbewho verbally acknowledged these results. Electronically Signed   By: AllLogan BoresD.   On: 12/23/2018 12:59   Ct Angio Neck W Or Wo Contrast  Result Date: 12/18/2018 CLINICAL DATA:  Left-sided weakness, facial droop, and slurred speech. EXAM: CT ANGIOGRAPHY HEAD AND NECK CT PERFUSION BRAIN  TECHNIQUE: Multidetector CT imaging of the head and neck was performed using the standard protocol during bolus administration of intravenous contrast. Multiplanar CT image reconstructions and MIPs were obtained to evaluate the vascular anatomy. Carotid stenosis measurements (when applicable) are obtained utilizing NASCET criteria, using the distal internal carotid diameter as the denominator. Multiphase CT imaging of the brain was performed following IV bolus contrast injection. Subsequent parametric perfusion maps were calculated using RAPID software. CONTRAST:  125m96mNIPAQUE IOHEXOL 350 MG/ML SOLN COMPARISON:  None. FINDINGS: CTA NECK FINDINGS The study is motion degraded including moderate to severe motion artifact through the larynx which limits assessment of the carotid bifurcations. Aortic arch: Standard 3 vessel aortic arch with mild atherosclerotic plaque. No significant arch vessel origin stenosis. Right carotid system: Patent with moderate calcified atherosclerosis about the carotid bifurcation. No evidence of dissection or significant stenosis within limitations of motion artifact. Left carotid system: Patent with mild calcified atherosclerosis about the carotid bifurcation without evidence of significant stenosis or dissection. Vertebral arteries: Patent with the left being mildly dominant. Calcified atherosclerosis at the vertebral artery origins results in moderate to severe stenosis bilaterally. Skeleton: Mild cervical disc degeneration. 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 Upper chest: Clear lung apices. Review of the MIP images confirms the above findings CTA HEAD FINDINGS Anterior circulation: The internal carotid arteries are patent from skull base to carotid termini with calcified atherosclerosis resulting in mild cavernous stenosis on the right. The right M1 segment is widely  patent. Branch vessel assessment is limited by motion artifact, however there is a  suspected mid right M2 branch vessel occlusion. The left MCA appears duplicated with suggestion of severe stenosis of the more superior origin although motion artifact may be contributing to this appearance. ACAs are patent without evidence of flow limiting proximal stenosis. No aneurysm is identified. Posterior circulation: The intracranial vertebral arteries are patent to the basilar without evidence of significant stenosis. P ICAs and a ICAs are not well evaluated. SCA is are patent. The basilar artery is widely patent. There is a small right posterior communicating artery. PCAs are patent without evidence of flow limiting proximal stenosis allowing for suspected artifactual narrowing in the proximal right P2 segment. No aneurysm is identified. Venous sinuses: Patent. Anatomic variants: None. Review of the MIP images confirms the above findings CT Brain Perfusion Findings: ASPECTS: 10 CBF (<30%) Volume: 1m Perfusion (Tmax>6.0s) volume: 962mMismatch Volume: 81m63mnfarction Location: No core infarct by standard RAPID criteria. Penumbra anterolaterally in the right parietal lobe. IMPRESSION: 1. Motion degraded examination with limited assessment of the small and medium-sized intracranial arteries. 2. Suspected mid right M2 branch vessel occlusion with 9 mL penumbra in the right parietal lobe and no core infarct based on CTP. 3. No more proximal large vessel occlusion. 4. Patent cervical carotid arteries without evidence of significant stenosis. 5. 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 6. Possible severe left MCA origin stenosis. 7.  Aortic Atherosclerosis (ICD10-I70.0). These results were called by telephone at the time of interpretation on 01/01/2019 at 12:29 pm to Dr. ERIKerney Elbewho verbally acknowledged these results. Electronically Signed   By: AllLogan BoresD.   On: 12/27/2018 12:59   Mr Brain Wo Contrast  Result Date: 12/21/2018 CLINICAL DATA:  Stroke follow-up  EXAM: MRI HEAD WITHOUT CONTRAST TECHNIQUE: Multiplanar, multiecho pulse sequences of the brain and surrounding structures were obtained without intravenous contrast. COMPARISON:  CTA head neck 01/04/2019 FINDINGS: BRAIN: Multifocal abnormal diffusion restriction within both cerebellar hemispheres and scattered throughout multiple bilateral supratentorial territories. The largest area of ischemia is in the right frontal operculum and insula. The midline structures are normal. No midline shift or other mass effect. Early confluent hyperintense T2-weighted signal of the periventricular and deep white matter, most commonly due to chronic ischemic microangiopathy. Generalized atrophy without lobar predilection. Multiple scattered foci of microhemorrhage, many of which correspond to areas of ischemia. No mass lesion. VASCULAR: Abnormal flow void within the right transverse sinus. The arterial flow voids are preserved. SKULL AND UPPER CERVICAL SPINE: Calvarial bone marrow signal is normal. There is no skull base mass. Visualized upper cervical spine and soft tissues are normal. SINUSES/ORBITS: Fluid level in the maxillary sinuses. The orbits are normal. IMPRESSION: 1. Multifocal acute ischemia scattered throughout both cerebral and cerebellar hemispheres, with the largest area located in the right frontal operculum and insula. 2. Multifocal petechiae/microhemorrhage at the sites of ischemic infarction. No intraparenchymal hematoma or mass effect. 3. Abnormal flow void in the right transverse sinus could indicate dural venous sinus thrombosis. However, on the earlier CTA and the interventional angiogram, the sinus was clearly patent. Electronically Signed   By: KevUlyses JarredD.   On: 12/21/2018 02:28   Ct Cerebral Perfusion W Contrast  Result Date: 01/01/2019 CLINICAL DATA:  Left-sided weakness, facial droop, and slurred speech. EXAM: CT ANGIOGRAPHY HEAD AND NECK CT PERFUSION BRAIN TECHNIQUE: Multidetector CT imaging  of the head and neck was performed using the standard protocol during  bolus administration of intravenous contrast. Multiplanar CT image reconstructions and MIPs were obtained to evaluate the vascular anatomy. Carotid stenosis measurements (when applicable) are obtained utilizing NASCET criteria, using the distal internal carotid diameter as the denominator. Multiphase CT imaging of the brain was performed following IV bolus contrast injection. Subsequent parametric perfusion maps were calculated using RAPID software. CONTRAST:  150m OMNIPAQUE IOHEXOL 350 MG/ML SOLN COMPARISON:  None. FINDINGS: CTA NECK FINDINGS The study is motion degraded including moderate to severe motion artifact through the larynx which limits assessment of the carotid bifurcations. Aortic arch: Standard 3 vessel aortic arch with mild atherosclerotic plaque. No significant arch vessel origin stenosis. Right carotid system: Patent with moderate calcified atherosclerosis about the carotid bifurcation. No evidence of dissection or significant stenosis within limitations of motion artifact. Left carotid system: Patent with mild calcified atherosclerosis about the carotid bifurcation without evidence of significant stenosis or dissection. Vertebral arteries: Patent with the left being mildly dominant. Calcified atherosclerosis at the vertebral artery origins results in moderate to severe stenosis bilaterally. Skeleton: Mild cervical disc degeneration. 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 Upper chest: Clear lung apices. Review of the MIP images confirms the above findings CTA HEAD FINDINGS Anterior circulation: The internal carotid arteries are patent from skull base to carotid termini with calcified atherosclerosis resulting in mild cavernous stenosis on the right. The right M1 segment is widely patent. Branch vessel assessment is limited by motion artifact, however there is a suspected mid right M2 branch  vessel occlusion. The left MCA appears duplicated with suggestion of severe stenosis of the more superior origin although motion artifact may be contributing to this appearance. ACAs are patent without evidence of flow limiting proximal stenosis. No aneurysm is identified. Posterior circulation: The intracranial vertebral arteries are patent to the basilar without evidence of significant stenosis. P ICAs and a ICAs are not well evaluated. SCA is are patent. The basilar artery is widely patent. There is a small right posterior communicating artery. PCAs are patent without evidence of flow limiting proximal stenosis allowing for suspected artifactual narrowing in the proximal right P2 segment. No aneurysm is identified. Venous sinuses: Patent. Anatomic variants: None. Review of the MIP images confirms the above findings CT Brain Perfusion Findings: ASPECTS: 10 CBF (<30%) Volume: 031mPerfusion (Tmax>6.0s) volume: 54m42mismatch Volume: 54mL93mfarction Location: No core infarct by standard RAPID criteria. Penumbra anterolaterally in the right parietal lobe. IMPRESSION: 1. Motion degraded examination with limited assessment of the small and medium-sized intracranial arteries. 2. Suspected mid right M2 branch vessel occlusion with 9 mL penumbra in the right parietal lobe and no core infarct based on CTP. 3. No more proximal large vessel occlusion. 4. Patent cervical carotid arteries without evidence of significant stenosis. 5. 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 6. Possible severe left MCA origin stenosis. 7.  Aortic Atherosclerosis (ICD10-I70.0). These results were called by telephone at the time of interpretation on 12/12/2018 at 12:29 pm to Dr. ERICKerney Elbeho verbally acknowledged these results. Electronically Signed   By: AlleLogan Bores.   On: 12/11/2018 12:59   Dg Chest Port 1 View  Result Date: 12/23/2018 CLINICAL DATA:  57 y88r old male with cerebral infarcts, sepsis,  bacteremia, respiratory failure. EXAM: PORTABLE CHEST 1 VIEW COMPARISON:  12/22/2018 and earlier. FINDINGS: Portable AP semi upright view at 0649 hours. Stable endotracheal tube tip between the level the clavicles and carina. Enteric tube courses to the abdomen, tip not included.  Stable right IJ central line. Mildly larger lung volumes and regressed retrocardiac opacity with air bronchograms. The left hemidiaphragm is visible today. Mildly asymmetric increased left lung interstitial opacity is stable. No pneumothorax or new pulmonary opacity. Normal cardiac size and mediastinal contours. Negative visible bowel gas pattern. IMPRESSION: 1. Stable lines and tubes. 2. Mildly larger lung volumes and regressed left lower lobe collapse or consolidation. 3. Asymmetric interstitial opacity in the left lung persists, favor infection over asymmetric edema. 4. No new cardiopulmonary abnormality. Electronically Signed   By: Genevie Ann M.D.   On: 12/23/2018 08:38   Dg Chest Port 1 View  Result Date: 12/22/2018 CLINICAL DATA:  58 year old male with cerebral infarcts, sepsis, staph bacteremia, respiratory failure. EXAM: PORTABLE CHEST 1 VIEW COMPARISON:  12/21/2018 and earlier. FINDINGS: Portable AP semi upright view at 0528 hours. ET tube in good position between the clavicles and carina. Enteric tube courses to the abdomen, side hole is at the level of the GE junction. Stable right IJ central line. Left lower lobe collapse or consolidation with some air bronchograms. Additional patchy left perihilar and medial right lung base opacity. No pneumothorax or pulmonary edema. No definite pleural effusion. No acute osseous abnormality identified. IMPRESSION: 1. Enteric tube side hole at the level of the GE junction, advance 5 cm to ensure side hole placement within the stomach. 2.  Otherwise stable lines and tubes. 3. Stable ventilation with left lower lobe collapse or consolidation and patchy additional perihilar opacity.  Electronically Signed   By: Genevie Ann M.D.   On: 12/22/2018 07:23   Dg Chest Port 1 View  Result Date: 12/21/2018 CLINICAL DATA:  Central line placement. EXAM: PORTABLE CHEST 1 VIEW COMPARISON:  01/09/2019 FINDINGS: Endotracheal tube with tip 3.1 cm above the carina. Interval advancement of the nasogastric tube which has tip just below the expected region of the gastroesophageal junction as this could be advanced another 5 cm. Right IJ central venous catheter has tip over the SVC. Lungs are adequately inflated demonstrate hazy opacification over the left lung which may be due to layering effusion with atelectasis. Subtle hazy opacification of the perihilar regions which may be due to mild interstitial edema. Cardiomediastinal silhouette and remainder of the exam is unchanged. IMPRESSION: Hazy opacification over the left lung which may be due to layering effusion with atelectasis. Mild hazy opacification of the perihilar regions which suggest mild interstitial edema. Tubes and lines as described. Electronically Signed   By: Marin Olp M.D.   On: 12/21/2018 11:39   Dg Chest Portable 1 View  Result Date: 01/06/2019 CLINICAL DATA:  Acute LEFT CVA. EXAM: PORTABLE CHEST 1 VIEW COMPARISON:  03/04/2016 radiographs FINDINGS: An endotracheal tube with tip 4 cm above the carina and OG tube with tip overlying the distal esophagus noted. UPPER limits normal heart size noted. There is no evidence of focal airspace disease, pulmonary edema, suspicious pulmonary nodule/mass, pleural effusion, or pneumothorax. No acute bony abnormalities are identified. IMPRESSION: Endotracheal tube with tip 4 cm above the carina. OG tube with tip overlying the distal esophagus-recommend advancement. No acute cardiopulmonary disease. Electronically Signed   By: Margarette Canada M.D.   On: 12/14/2018 14:45   Dg Abd Portable 1 View  Result Date: 01/03/2019 CLINICAL DATA:  OG tube placement. EXAM: PORTABLE ABDOMEN - 1 VIEW COMPARISON:  None.  FINDINGS: An OG tube is identified with tip overlying the distal esophagus. Recommend advancement. The bowel gas pattern is unremarkable. IMPRESSION: OG tube with tip overlying the distal esophagus-recommend  advancement. Electronically Signed   By: Margarette Canada M.D.   On: 12/29/2018 14:46   Ct Head Code Stroke Wo Contrast  Result Date: 12/17/2018 CLINICAL DATA:  Code stroke. Left-sided weakness, facial droop, and slurred speech. EXAM: CT HEAD WITHOUT CONTRAST TECHNIQUE: Contiguous axial images were obtained from the base of the skull through the vertex without intravenous contrast. COMPARISON:  Head CT 09/17/2013 FINDINGS: Brain: There is no evidence of acute infarct, intracranial hemorrhage, mass, midline shift, or extra-axial fluid collection. Patchy cerebral white matter hypodensities are similar to the prior CT and nonspecific but compatible with moderately age advanced chronic small vessel ischemic disease. Mild prominence of the lateral ventricles is also unchanged and may reflect central predominant cerebral atrophy. Vascular: Calcified atherosclerosis at the skull base. No hyperdense vessel. Skull: No fracture or focal osseous lesion. Sinuses/Orbits: Mild mucosal thickening in the maxillary sinuses. Clear mastoid air cells. Left cataract extraction. Other: None. ASPECTS [facility] Stroke Program Early CT Score) - Ganglionic level infarction (caudate, lentiform nuclei, internal capsule, insula, M1-M3 cortex): 7 - Supraganglionic infarction (M4-M6 cortex): 3 Total score (0-10 with 10 being normal): 10 IMPRESSION: 1. No evidence of acute intracranial abnormality. 2. ASPECTS is 10. 3. Moderate chronic small vessel ischemic disease. These results were communicated to Dr. Cheral Marker at 12:09 pm on 12/27/2018 by text page via the [facility] messaging system. Electronically Signed   By: Logan Bores M.D.   On: 12/19/2018 12:37    Assessment and Plan:   Artavis Cowie is a 58 y.o. 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 Leonie Man.  1. Afib RVR: reported hx of same in chart. Does not appear that he has been on [facility] as far back as office visit in January. Placed on amio gtt, and also being loaded with PO amiodarone 239m BID. Blood pressures had been low but now transitioned off pressors. Placed on ASA 837mvia neurology. No plans for OAC 2/2 to endocarditis and risk of bleeding. Would continue amiodarone given borderline pressures.  2. Sepsis with MSSA bacteremia: BC + on admission. WBC 20>>15.4. Temp 103 while in the ED.  -- antibiotics per ID  3. 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 He therefore meets criteria for endocarditis without TEE. The question would be whether he is a surgical candidate. He has eccentric MR, noted as severe on Dr. NeFrancesca Omanead, and embolic CVA. These are indications for potential surgery, though his multiorgan involvement makes the question of timing difficult, and the potential for hemorrhage with heparin for cardiac bypass is not trivial. With chronic renal failure and now respiratory failure, as well as risk of hemorrhagic transformation of embolic CVA, he would be high risk for surgery in the acute period.   Overall, would not recommend TEE for diagnosis--he already fits the diagnosis of endocarditis as above. TEE could be useful for surgical planning; therefore if patient would be a surgical candidate, we could perform TEE to assist with this. It would be best if he had clinical improvement (extubated, BP improving, respiratory status improving) for TEE, but if there were plans for urgent surgery the risk vs. Benefit of TEE could be re-evaluated sooner. If surgery would not  be  considered for at least 4 weeks given the high risk, or if he is not a surgical candidate at all, then would continue medical treatment of endocarditis guided by ID's recommendations.  Images of mitral valve, with posterior leaflet on the left side in these images. There is significant increase in thickening and a new mobile area attached to the leaflet that was not present even a few months ago.  now,  prior 09/2018  prior 2017   In terms of the MR, there was no significant MR previously, and now there is a wide jet of eccetric MR that follows along the wall of the septum and up to the pulmonary veins now  prior 09/2018  prior 2017  4. Acute respiratory failure in setting of stroke: remains intubated with sedation weaned this morning. Extubation trial aborted this morning 2/2 to somnolence.  5. Acute CVA: neurology following. Unsuccessful attempt at thrombectomy via IR. MRI with multifocal acute ischemia scattered to bilateral hemispheres.  6. ERSD: on CRRT with nephrology following.  For questions or updates, please contact [place] Please consult [email or web address] for contact info under   Signed, Buford Dresser, MD  12/23/2018 12:55 PM   Buford Dresser, MD, PhD [facility]  [address] [place], [place] [address] [phone]

## 2018-12-23 NOTE — Progress Notes (Signed)
[facility]    NEPHROLOGY PROGRESS NOTE  SUBJECTIVE:   He has been on CRRT with 1.5 liters UF over 5/12.  Spoke with nursing.  Clotted once last night and was restarted.  He has been weaned off of levo.  Possibly to be extubated today.   Review of systems:  Unable to obtain 2/2 intubated    OBJECTIVE:  Vitals:   12/23/18 0500 12/23/18 0600 12/23/18 0700 12/23/18 0734  BP:  (!) 78/60    Pulse: (!) 34   88  Resp: (!) 25 (!) 22 (!) 22 (!) 22  Temp: 97.9 F (36.6 C) 98.4 F (36.9 C) 97.9 F (36.6 C)   TempSrc:      SpO2: 99%   100%  Weight:      Height:          Intake/Output Summary (Last 24 hours) at 12/23/2018 0741 Last data filed at 12/23/2018 0700 Gross per 24 hour  Intake 1567.95 ml  Output 1589 ml  Net -21.05 ml     Vitals reviewed  110/67 HR 96 irregular RR 22 on my exam   General: critically ill adult male intubated  HEENT: NCAT; eyes closed  CV:  S1S2 no rub  Lungs:  Clear to auscultation bilaterally  Abd: soft nondistended  Extremities:  No LE edema Access: left forearm AV fistula with suturing noted; good bruit and thrill; RIJ nontunneled vascath  Skin:  No skin rash on extremities exposed Neuro - there is no continuous sedation running.  He moves his right foot in response to commands.  MEDICATIONS:  . aspirin  81 mg Per Tube Daily  . chlorhexidine gluconate (MEDLINE KIT)  15 mL Mouth Rinse BID  . feeding supplement (PRO-STAT SUGAR FREE 64)  60 mL Per Tube QID  . mouth rinse  15 mL Mouth Rinse 10 times per day  . pantoprazole sodium  40 mg Per Tube Q24H  . sodium chloride flush  3 mL Intravenous Once     LABS:   CBC Latest Ref Rng & Units 12/23/2018 12/22/2018 01/01/2019  WBC 4.0 - 10.5 K/uL 18.7(H) 15.4(H) 20.2(H)  Hemoglobin 13.0 - 17.0 g/dL 9.0(L) 8.8(L) 11.0(L)  Hematocrit 39.0 - 52.0 % 25.8(L) 25.7(L) 31.6(L)  Platelets 150 - 400 K/uL 67(L) 54(L) 75(L)    CMP Latest Ref Rng & Units 12/23/2018 12/22/2018 12/22/2018  Glucose 70  - 99 mg/dL 123(H) 100(H) 94  BUN 6 - 20 mg/dL 46(H) 61(H) 79(H)  Creatinine 0.61 - 1.24 mg/dL 6.45(H) 9.12(H) 12.67(H)  Sodium 135 - 145 mmol/L 134(L) 132(L) 128(L)  Potassium 3.5 - 5.1 mmol/L 4.2 4.2 4.8  Chloride 98 - 111 mmol/L 99 99 94(L)  CO2 22 - 32 mmol/L 21(L) 19(L) 18(L)  Calcium 8.9 - 10.3 mg/dL 8.4(L) 8.4(L) 8.3(L)  Total Protein 6.5 - 8.1 g/dL - - -  Total Bilirubin 0.3 - 1.2 mg/dL - - -  Alkaline Phos 38 - 126 U/L - - -  AST 15 - 41 U/L - - -  ALT 0 - 44 U/L - - -        Component Value Date/Time   PHART 7.462 (H) 12/25/2018 1653   PCO2ART 38.4 01/05/2019 1653   PO2ART 129 (H) 01/06/2019 1653   HCO3 26.8 12/27/2018 1653   TCO2 22 03/04/2016 1206   O2SAT 98.2 12/26/2018 1653    ASSESSMENT/PLAN:     The patient is a 58 y.o. year old male patient with a past medical history significant for atrial fibrillation, hypertension,  end-stage renal disease on hemodialysis Tuesday, Thursday, and Saturday from [place] [facility] who presented to the emergency department via EMS with acute onset of left hemiparesis and left facial droop with dysarthria.    1.  End-stage renal disease on hemodialysis.   - normally HD TTS - On CRRT via temporary dialysis catheter.  Keep even for now  - If cartridge clots today please page Royce Macadamia prior to restarting.  May be able to convert to [facility] tomorrow  2.  Acute ischemic stroke.  Unable to perform thrombectomy due to location.  Continue supportive care.  Concern for septic emboli.  Note mitral valve calcifications noted on TTE concerning for source of emboli.  Per primary team and neurology.  3. Acute hypoxic respiratory failure on mechanical ventilation   4.  MSSA bacteremia.  On cefazolin.  ID following.  Cultures repeated 5/13.  Left forearm AV fistula does not appear to be source of infection.  It does appear that left forearm AV fistula had some potential recent intervention as suturing noted.    5.  Anemia of chronic kidney disease.   Worsening.  No acute indication for PRBC's.  Would avoid ESA's in the setting of an acute stroke and defer IV iron with bacteremia.  6. Secondary hyperparathyroidism. off binders as on CRRT  7. A fib with hx RVR - on amio and currently rate controlled a fib   Claudia Desanctis

## 2018-12-23 NOTE — Progress Notes (Signed)
Called by RN to look at patient. Pt had high RR on the vent, and increased WOB. Patient BBS Rhonchi. Patient was suctioned with no relief and then bag lavaged. When suctioning a pink tinged plug was retrieved. Listened to patient and now has a slight wheeze. No increased WOB or RR. Treatment given. Patient continues to get volumes. Vitals stable at time. RN at bedside.

## 2018-12-23 NOTE — Progress Notes (Signed)
STROKE TEAM PROGRESS NOTE   INTERVAL HISTORY Patient remains drowsy and off Versed sedation.  Can follow few commands on the right side.  He still requires vasopressors for hemodynamic support.  Blood cultures have grown staph which is sensitive to Ancef.  Have consulted cardiology for TEE but feel patient meets criteria for diagnosis of bacterial endocarditis and TEE will be considered only if patient is a surgical candidate preop.  He is on amiodarone drip and plan to transition to oral route.  Pulmonary critical care team plan to wean ventilatory support to see if he can be extubated.  He is currently on CRRT dialysis with plans to switch to hemodialysis per nephrology  Vitals:   12/23/18 1100 12/23/18 1128 12/23/18 1200 12/23/18 1300  BP:   104/68   Pulse:      Resp: 20  19 17   Temp: (!) 97.2 F (36.2 C)  (!) 96.8 F (36 C) (!) 97.3 F (36.3 C)  TempSrc:      SpO2:  100% 100%   Weight:      Height:        CBC:  Recent Labs  Lab 12/14/2018 1139 12/22/18 0537 12/23/18 0524  WBC 20.2* 15.4* 18.7*  NEUTROABS 17.7*  --   --   HGB 11.0* 8.8* 9.0*  HCT 31.6* 25.7* 25.8*  MCV 92.1 93.1 91.5  PLT 75* 54* 67*    Basic Metabolic Panel:  Recent Labs  Lab 12/21/18 2226  12/22/18 1600 12/23/18 0524  NA 128*   < > 132* 134*  K 4.9   < > 4.2 4.2  CL 92*   < > 99 99  CO2 19*   < > 19* 21*  GLUCOSE 79   < > 100* 123*  BUN 74*   < > 61* 46*  CREATININE 12.93*   < > 9.12* 6.45*  CALCIUM 8.0*   < > 8.4* 8.4*  MG 1.9  --   --  2.2  PHOS  --    < > 4.3 3.1  3.1   < > = values in this interval not displayed.    PHYSICAL EXAM:  Middle-age male who is sedated intubated and on ventilator.  He is on vasopressor drips. . Afebrile. Head is nontraumatic. Neck is supple without bruit.    Cardiac exam ejection murmur . no gallop. Lungs are clear to auscultation. Distal pulses are well felt. Neurological Exam :  Patient is sedated, intubated but can be aroused with sternal rub.Marland Kitchen  He follows  a few simple commands on right side intermittently    He has right gaze preference    He blinks to threat more on the right than the left.  Pupils equal reactive.  Fundi not visualized.  Mild left lower facial weakness.  Left hemiplegia with 0/5 left upper extremity strength.  2/5 left lower extremity strength.  ASSESSMENT/PLAN Thomas Adams is a 58 y.o. male with history of atrial fibrillation not on AC, HTN and ESRD on HD presenting with left hemiparesis and left facial droop with dysarthria. Sent to IR but no correctable lesion.  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 Small vessel disease. ASPECTS 10.     CTA head & neck suspected mid R M2 branch occlusion w/ 95mL penumbra R parietal. No LVO. Possible severe L MCA origin stenosis. Aortic atherosclerosis   CT perfusion no core  Cerebral angio RT MCA   Sup division M3 branch tapered occlusion. No intervention  MRI  Multifocal B supratentorial infarcts. No shift or mass effect. General atrophy. Mult scattered foci of microhemorrhages at sites of infarct. Abnormal flow void R transverse sinus (sinus clear on previous CTA).  2D Echo EF .65%. severe LVH. LA and RA dilated. Severe AV thickening. No source of embolus seen.  HIV negative   TEE once stable  LDL UTC d/t high TG 691  HgbA1c 5.5  SCDs for VTE prophylaxis. Hold Heparin given low PLTS   No antithrombotic prior to admission, now on aspirin 81 mg daily Not a good short term anticoagulation candidate due to endocarditis  Therapy recommendations:  pending   Disposition:  pending   Acute Hypoxic Respiratory Failure, compromised airway  Intubated initially for IR  Remains intubated, sedated  Hope for extubation today  PCCM on board  Sepsis w/ MSSA bacteremia in setting of new severe MR likely endocarditis  WBC 18.7  afebrile  On  cefazolin  CXR advanced FT, o/w stable  TEE not necessary per cardiology until preop for cardiac surgery   Repeat Blood cultures pending   UCx no growth  Lactic acid 3.3  ID on board  Atrial Fibrillation w/ RVR . Home anticoagulation:  none  . Not an AC candidate d/t endocarditis and risk for bleeding . HR as high as 140s . On Amiodarone infusion  . HR now 90-100s . Cardiology consult to manage AF and drips . INR 1.7 . PLT 75 . Mg  2.2   Hypotension in setting of sepsis  Hypertensive Emergency on admission   BP as high as 174/139 on admission  Home meds:  Norvasc 10, hydralazine 25 tid, labetalol 100 bid, lisinopril 40  BP low 78-113  On and Off pressor during the night  . BP goal normotensive  Hyperlipidemia  Home meds:  No statin  LDL UTC d/t high TG 691, goal < 70  Recheck lipids once off lipid based drips  Consider statin if LDL remains elevated at that time  Dysphagia  On vent  TF per CCM  Other Stroke Risk Factors  Overweight, Body mass index is 27.77 kg/m., recommend weight loss, diet and exercise as appropriate   Other Active Problems  ESRD on HD TTS. Nephrology on board. temporary dialysis catheter placed. Now on CRRT and keep today. Anticipate transition to HD tomorrow.   Anemia of chronic kidney disease 8.7  Secondary hyperparathyroidism   Hospital day # 3   I have personally obtained history,examined this patient, reviewed notes, independently viewed imaging studies, participated in medical decision making and plan of care.ROS completed by me personally and pertinent positives fully documented  I have made any additions or clarifications directly to the above note. . The patient remains critically ill with involvement of multiple organs including stroke, respiratory failure, endocarditis, renal failure.  Continue antibiotics per ID for 6 weeks for endocarditis.  Wean off pressors as well as ventilatory support.  Appreciate cardiology  consult.  Transition IV amiodarone to p.o. Discussed with Dr. Halford Chessman and Dr. Linus Salmons This patient is critically ill and at significant risk of neurological worsening, death and care requires constant monitoring of vital signs, hemodynamics,respiratory and cardiac monitoring, extensive review of multiple databases, frequent neurological assessment, discussion with family, other specialists and medical decision making of high complexity.I have made any additions or clarifications directly to the above note.This critical care time does not reflect procedure time, or teaching time or supervisory  time of PA/NP/Med Resident etc but could involve care discussion time.  I spent 35 minutes of neurocritical care time  in the care of  this patient.     Antony Contras, MD Medical Director [facility] Pager: [phone] 12/23/2018 1:32 PM    To contact Stroke Continuity provider, please refer to [email or web address] After hours, contact General Neurology

## 2018-12-23 NOTE — Progress Notes (Signed)
ID Pharmacy Note  Spoke with Janene Madeira, NP and will change patient to nafcillin for better CNS penetration due to septic emboli. Patient has noted Penicillin allergy of nausea and dizziness so feel comfortable challenging. Will observe for adverse effects.    Jimmy Footman, PharmD, BCPS, [facility] Clinical Pharmacist Phone: [phone] 12/23/2018 4:23 PM

## 2018-12-24 ENCOUNTER — Inpatient Hospital Stay (HOSPITAL_COMMUNITY): Payer: Medicare Other

## 2018-12-24 ENCOUNTER — Encounter (HOSPITAL_COMMUNITY): Payer: Self-pay | Admitting: Interventional Radiology

## 2018-12-24 DIAGNOSIS — I6601 Occlusion and stenosis of right middle cerebral artery: Secondary | ICD-10-CM

## 2018-12-24 DIAGNOSIS — I480 Paroxysmal atrial fibrillation: Secondary | ICD-10-CM | POA: Diagnosis present

## 2018-12-24 DIAGNOSIS — R000 Tachycardia, unspecified: Secondary | ICD-10-CM

## 2018-12-24 DIAGNOSIS — J9601 Acute respiratory failure with hypoxia: Secondary | ICD-10-CM

## 2018-12-24 LAB — RENAL FUNCTION PANEL
Albumin: 2 g/dL — ABNORMAL LOW (ref 3.5–5.0)
Albumin: 1.7 g/dL — ABNORMAL LOW (ref 3.5–5.0)
Anion gap: 11 (ref 5–15)
Anion gap: 12 (ref 5–15)
BUN: 43 mg/dL — ABNORMAL HIGH (ref 6–20)
BUN: 52 mg/dL — ABNORMAL HIGH (ref 6–20)
CO2: 24 mmol/L (ref 22–32)
CO2: 24 mmol/L (ref 22–32)
Calcium: 8.6 mg/dL — ABNORMAL LOW (ref 8.9–10.3)
Calcium: 8.7 mg/dL — ABNORMAL LOW (ref 8.9–10.3)
Chloride: 101 mmol/L (ref 98–111)
Chloride: 101 mmol/L (ref 98–111)
Creatinine, Ser: 4.11 mg/dL — ABNORMAL HIGH (ref 0.61–1.24)
Creatinine, Ser: 4.23 mg/dL — ABNORMAL HIGH (ref 0.61–1.24)
GFR calc Af Amer: 18 mL/min — ABNORMAL LOW (ref >60)
GFR calc Af Amer: 17 mL/min — ABNORMAL LOW (ref >60)
GFR calc non Af Amer: 15 mL/min — ABNORMAL LOW (ref >60)
GFR calc non Af Amer: 15 mL/min — ABNORMAL LOW (ref >60)
Glucose, Bld: 137 mg/dL — ABNORMAL HIGH (ref 70–99)
Glucose, Bld: 123 mg/dL — ABNORMAL HIGH (ref 70–99)
Phosphorus: 2.6 mg/dL (ref 2.5–4.6)
Phosphorus: 3.5 mg/dL (ref 2.5–4.6)
Potassium: 4.3 mmol/L (ref 3.5–5.1)
Potassium: 4.6 mmol/L (ref 3.5–5.1)
Sodium: 136 mmol/L (ref 135–145)
Sodium: 137 mmol/L (ref 135–145)

## 2018-12-24 LAB — PHOSPHORUS
Phosphorus: 2.6 mg/dL (ref 2.5–4.6)
Phosphorus: 3.5 mg/dL (ref 2.5–4.6)

## 2018-12-24 LAB — GLUCOSE, CAPILLARY
Glucose-Capillary: 100 mg/dL — ABNORMAL HIGH (ref 70–99)
Glucose-Capillary: 109 mg/dL — ABNORMAL HIGH (ref 70–99)
Glucose-Capillary: 110 mg/dL — ABNORMAL HIGH (ref 70–99)
Glucose-Capillary: 114 mg/dL — ABNORMAL HIGH (ref 70–99)
Glucose-Capillary: 116 mg/dL — ABNORMAL HIGH (ref 70–99)
Glucose-Capillary: 120 mg/dL — ABNORMAL HIGH (ref 70–99)
Glucose-Capillary: 133 mg/dL — ABNORMAL HIGH (ref 70–99)

## 2018-12-24 LAB — CBC
HCT: 22.6 % — ABNORMAL LOW (ref 39.0–52.0)
Hemoglobin: 7.7 g/dL — ABNORMAL LOW (ref 13.0–17.0)
MCH: 31.6 pg (ref 26.0–34.0)
MCHC: 34.1 g/dL (ref 30.0–36.0)
MCV: 92.6 fL (ref 80.0–100.0)
Platelets: 83 10*3/uL — ABNORMAL LOW (ref 150–400)
RBC: 2.44 MIL/uL — ABNORMAL LOW (ref 4.22–5.81)
RDW: 14.8 % (ref 11.5–15.5)
WBC: 22.9 10*3/uL — ABNORMAL HIGH (ref 4.0–10.5)
nRBC: 0 % (ref 0.0–0.2)

## 2018-12-24 LAB — TRIGLYCERIDES: Triglycerides: 628 mg/dL — ABNORMAL HIGH (ref <150)

## 2018-12-24 LAB — MAGNESIUM
Magnesium: 2.5 mg/dL — ABNORMAL HIGH (ref 1.7–2.4)
Magnesium: 2.5 mg/dL — ABNORMAL HIGH (ref 1.7–2.4)

## 2018-12-24 MED ORDER — HEPARIN SODIUM (PORCINE) 1000 UNIT/ML IJ SOLN
1200.0000 [IU] | Freq: Once | INTRAMUSCULAR | Status: AC
  Administered 2018-12-24: 1200 [IU] via INTRAVENOUS
  Filled 2018-12-24: qty 2

## 2018-12-24 MED ORDER — DARBEPOETIN ALFA 150 MCG/0.3ML IJ SOSY
150.0000 ug | PREFILLED_SYRINGE | INTRAMUSCULAR | Status: DC
  Filled 2018-12-24 (×2): qty 0.3

## 2018-12-24 MED ORDER — SODIUM CHLORIDE 0.9 % IV BOLUS
150.0000 mL | Freq: Once | INTRAVENOUS | Status: AC
  Administered 2018-12-24: 150 mL via INTRAVENOUS

## 2018-12-24 NOTE — Progress Notes (Signed)
SLP Cancellation Note  Patient Details Name: Thomas Adams MRN: [id number] DOB: 07-23-1961   Cancelled treatment:       Reason Eval/Treat Not Completed: Medical issues which prohibited therapy(Pt remains intubated at this time. SLP will follow up. )  Thomas Adams, [name], [name] Office number [phone] Pager [phone]  Thomas Adams 12/24/2018, 7:52 AM

## 2018-12-24 NOTE — Progress Notes (Signed)
Pt w/ tachypnea and desating. RR in 40s, HR 140's. 100 PRN fentanyl given, RR down to 20's, pt remains to desat.  RT at bedside, increased FiO2 from 35% to 60. ETT tube remains at 24 @ lip, no secretions with inline suctioning. Dr. Lucile Shutters aware, and STAT CXR ordered.  WCTM

## 2018-12-24 NOTE — Progress Notes (Signed)
eLink Physician-Brief Progress Note Patient Name: Thomas Adams DOB: 07-19-1961 MRN: [id number]   Date of Service  12/24/2018  HPI/Events of Note  CXR shows bilateral atelectasis  eICU Interventions  PEEP increased to 8 cm        Aisa Schoeppner U Verner Mccrone 12/24/2018, 4:22 AM

## 2018-12-24 NOTE — Progress Notes (Signed)
Notified of worsening atelectasis on CXR, PEEP inc to 8, and orders to wean FiO2 as able. RT made aware

## 2018-12-24 NOTE — Progress Notes (Signed)
STROKE TEAM PROGRESS NOTE   INTERVAL HISTORY Patient remains critically ill.  Is still intubated but is being weaned this morning.  He is off the vasopressor support.  He remains on ceftezolin for his endocarditis and staph bacteremia.  He is off the amiodarone drip this morning.  Neurologically he is sleepy but can be aroused and follows commands consistently on the right side.  He remains plegic on the left.  He remains on CRRT and creatinine is improving and plans are to transition to hemodialysis Vitals:   12/24/18 0800 12/24/18 0821 12/24/18 0900 12/24/18 1000  BP: 106/81  (!) 109/91 106/71  Pulse: (!) 117  (!) 115 (!) 107  Resp: (!) 26  (!) 26 (!) 22  Temp: (!) 97.5 F (36.4 C)  (!) 97.3 F (36.3 C) 97.7 F (36.5 C)  TempSrc:      SpO2: 97% 100% 97% 96%  Weight:      Height:        CBC:  Recent Labs  Lab 12/30/2018 1139  12/23/18 0524 12/24/18 0540  WBC 20.2*   < > 18.7* 22.9*  NEUTROABS 17.7*  --   --   --   HGB 11.0*   < > 9.0* 7.7*  HCT 31.6*   < > 25.8* 22.6*  MCV 92.1   < > 91.5 92.6  PLT 75*   < > 67* 83*   < > = values in this interval not displayed.    Basic Metabolic Panel:  Recent Labs  Lab 12/23/18 1649 12/24/18 0540  NA 135 136  K 4.1 4.3  CL 100 101  CO2 22 24  GLUCOSE 138* 137*  BUN 44* 43*  CREATININE 5.44* 4.11*  CALCIUM 8.6* 8.6*  MG 2.5* 2.5*  PHOS 2.4*  2.4* 2.6  2.6    PHYSICAL EXAM:   Middle-age male who is   intubated and on ventilator.  He is on vasopressor drips. . Afebrile. Head is nontraumatic. Neck is supple without bruit.    Cardiac exam ejection murmur . no gallop. Lungs are clear to auscultation. Distal pulses are well felt. Neurological Exam :  Patient is drowsy intubated but can be aroused with sternal rub.Marland Kitchen  He follows a few simple commands on right side intermittently    He has right gaze preference    He blinks to threat more on the right than the left.  Pupils equal reactive.  Fundi not visualized.  Mild left lower facial  weakness.  Left hemiplegia with 0/5 left upper extremity strength.  1/5 left lower extremity strength.  He withdraws left lower extremity slightly to pain but not left upper extremity.   ASSESSMENT/PLAN [name] is a 58 y.o. male with history of atrial fibrillation not on AC, HTN and ESRD on HD presenting with left hemiparesis and left facial droop with dysarthria. Sent to IR but no correctable lesion.  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 Small vessel disease. ASPECTS 10.     CTA head & neck suspected mid R M2 branch occlusion w/ 51mL penumbra R parietal. No LVO. Possible severe L MCA origin stenosis. Aortic atherosclerosis   CT perfusion no core  Cerebral angio RT MCA   Sup division M3 branch tapered occlusion. No intervention  MRI  Multifocal B supratentorial infarcts. No shift or mass effect. General atrophy. Mult scattered foci of microhemorrhages at sites  of infarct. Abnormal flow void R transverse sinus (sinus clear on previous CTA).  2D Echo EF .65%. severe LVH. LA and RA dilated. Severe AV thickening. No source of embolus seen.  HIV negative   TEE not indicated per cardiology as meeting dx criteria for endocarditis  LDL UTC d/t high TG 691  HgbA1c 5.5  SCDs for VTE prophylaxis. Hold Heparin given low PLTS   No antithrombotic prior to admission, now on aspirin 81 mg daily Not a good anticoagulation candidate due to endocarditis at this time  Therapy recommendations:  pending   Disposition:  pending   Acute Hypoxic Respiratory Failure, compromised airway  Intubated initially for IR  Remains intubated, sedated - weaned for 6 hours yesterday  CXR B atx  Holding extubation x 24h d/t worsening CXR, mild agitation  PCCM on board  Sepsis w/ MSSA bacteremia in setting of new severe MR with mobile mass, likely endocarditis  WBC  18.7->22.9  TMax past 24h 100.4  On nafcillin   CXR advanced FT, o/w stable  TEE not necessary per cardiology until preop for cardiac surgery   Repeat Blood cultures pending   UCx no growth  Lactic acid 3.3  ID on board  CVTS consult  Atrial Fibrillation w/ RVR . Home anticoagulation:  none  . Not an AC candidate d/t endocarditis and risk for bleeding . HR as high as 140s . Amiodarone infusion transitioned to oral   . HR 100-120s . Cardiology on board . INR 1.7 . Mg  2.2   Hypotension in setting of sepsis  Hypertensive Emergency on admission   BP as high as 174/139 on admission  Home meds:  Norvasc 10, hydralazine 25 tid, labetalol 100 bid, lisinopril 40  BP low normal 200-120s  Off pressors  Not on BP meds . BP goal normotensive  Hyperlipidemia  Home meds:  No statin  LDL UTC d/t high TG 691, goal < 70  Recheck lipids once off lipid based drips  Consider statin if LDL remains elevated at that time  Dysphagia  On vent  TF per CCM  Other Stroke Risk Factors  Overweight, Body mass index is 27.77 kg/m., recommend weight loss, diet and exercise as appropriate   Other Active Problems  ESRD on HD TTS. Nephrology on board. temporary dialysis catheter placed. Off CRRT 5/14. Plan next HD Fri/Sat  Anemia of chronic illness, kidney disease 9.0-8.8-7.7  Secondary hyperparathyroidism   Thrombocytopenia in setting of sepsis 54-67-83. SQ heparin held. Also stopping heparin thru CRRT.  Hospital day # 4   Patient remains critically ill with multiple system involvement.  Neurologically he is stable but has persistent left hemiplegia.  Plan wean off ventilatory support and hopefully extubate in the next 1 to 2 days as per pulmonary critical care team.  Consult cardiothoracic surgery for his mitral valve vegetation for possible elective surgery after improvement.  Continue aspirin for now but may switch to anticoagulation after completing antibiotic course for  endocarditis for 6 weeks.  Discussed with Thomas Adams and Thomas Adams and answered questions. This patient is critically ill and at significant risk of neurological worsening, death and care requires constant monitoring of vital signs, hemodynamics,respiratory and cardiac monitoring, extensive review of multiple databases, frequent neurological assessment, discussion with family, other specialists and medical decision making of high complexity.I have made any additions or clarifications directly to the above note.This critical care time does not reflect procedure time, or teaching time or supervisory time of PA/NP/Med Resident etc but could involve care  discussion time.  I spent 30 minutes of neurocritical care time  in the care of  this patient.     Antony Contras, MD Medical Director [facility] Pager: [phone] 12/24/2018 10:30 AM    To contact Stroke Continuity provider, please refer to [email or web address] After hours, contact General Neurology

## 2018-12-24 NOTE — Progress Notes (Signed)
NAME:  Thomas Adams, MRN:  [id number], DOB:  1960-09-01, LOS: 4 ADMISSION DATE:  12/17/2018, CONSULTATION DATE:  12/24/18 REFERRING MD:  Dr. Germaine Pomfret , CHIEF COMPLAINT:  Left side weakness   Brief History   58 yr old with history of HTN, afib, febrile and coughing x 1 d, found with left facial droop, left sided weakness 5/10. 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 Brought to ICU intubated  Past Medical History  HTN ESRD, dialysis TThSat Afib w/ RVR  Significant Hospital Events   5/10 Angiogram in IR >> no clot retraction due to distal location of thrombus; hypotension from meds 5/11 d/c diprivan due to triglyceride levels 5/12 start CRRT; start amiodarone  Consults:  Nephrology  ID Cardiology  Procedures:  ETT 5/10 >>  Rt radial aline 5/10 >>  Rt IJ HD cath 5/11 >>   Significant Diagnostic Tests:  CT angio head/neck 5/10 >> Rt M2 occlusion, severe Lt MCA stenosis Echo 5/10 >> EF greater than 65%, severe LCH, severe MR MRI brain 5/11 >> multifocal acute ischemia scattered in both cerebral and cerebellar hemispheres CXR 5/13> L lung opacity  Micro Data:  COVID 5/10 >> negative Blood 5/10 >> MSSA  Antimicrobials:  Cefepime 5/10 >> 5/11 Vancomycin 5/10 >> 5/11 Ancef 5/11 >>  Subjective:   Remains critically ill, intubated Off pressors Remains on CRRT Mild agitation but weaning well on pressure support  Objective   Blood pressure 106/71, pulse (!) 107, temperature 97.7 F (36.5 C), resp. rate (!) 22, height 5\' 9"  (1.753 m), weight 85.3 kg, SpO2 96 %. CVP:  [11 mmHg-78 mmHg] 13 mmHg  Vent Mode: CPAP;PSV FiO2 (%):  [35 %-60 %] 40 % Set Rate:  [22 bmp] 22 bmp Vt Set:  [560 mL] 560 mL PEEP:  [5 cmH20-8 cmH20] 8 cmH20 Pressure Support:  [10 YTK16-01 cmH20] 10 cmH20 Plateau Pressure:  [17 cmH20-29 cmH20] 20 cmH20   Intake/Output Summary (Last 24 hours) at 12/24/2018 1027 Last data filed at 12/24/2018  1000 Gross per 24 hour  Intake 1856.35 ml  Output 1916 ml  Net -59.65 ml   Filed Weights   01/06/2019 1100 12/12/2018 1138 12/13/2018 1500  Weight: 87.2 kg 87.2 kg 85.3 kg    Examination: General - adult male, intubated, NAD  HEENT: NCAT, ETT secure, trachea midline  Cardiac - IRIR, s1s2, no JVD  Chest -decreased breath sounds bilateral Abdomen - Soft, round, ndnt, bowel sounds x4  Extremities - No obvious deformity. Bilateral pedal edema. No clubbing no cyanosis  Skin - clean dry warm no rash  Neuro -mild agitation, RA SS +1, follows commands, power 3-4/5 on right, hemiplegic on left   Chest x-ray 5/14 personally reviewed which shows worsening bilateral infiltrates  Resolved Hospital Problem list     Assessment & Plan:   Acute respiratory failure due to compromised airway in setting of CVA Lung infiltrate could be edema versus septic emboli Plan -Weaning well but with mild agitation, mental status slightly improved but still remains a barrier to extubation with worsening chest x-ray, will hold off extubation 24 hours   Septic shock, MSSA bacteremia in setting of new severe MR. Plan -Off pressors - Continue ancef per ID  - Continue to rend WBC   CVA. Thrombectomy by IR unsuccessful Plan - per neurology -Aspirin, no anticoagulation for now  ESRD. Hyponatremia. Non gap metabolic acidosis. Plan -nephrology following -Transition from CRRT, to intermittent HD  Elevated triglycerides on propofol  Plan -Use Precedex instead, goal RA SS 0 to -1   Anemia of critical illness and chronic disease. Thrombocytopenia in setting of sepsis. Plan - trend CBC  - transfuse for Hb < 7, PLT < 10K  Infective endocarditis Severe LVH Aortic valve calcification Mitral regurg , new, eccentric Atrial Fibrillation , not on anticoagulation - will need 6 weeks of antibiotics -Will need TEE only if surgery planned -amio gtt   Best practice:  Diet: NPO DVT prophylaxis: SCDs GI  prophylaxis: protonix Mobility: bed rest Code Status: full Disposition: ICU  Summary-getting closer to extubation , mental status improving, he has eccentric new MR due to endocarditis and may yet need mitral valve surgery although he would be very high risk   The patient is critically ill with multiple organ systems failure and requires high complexity decision making for assessment and support, frequent evaluation and titration of therapies, application of advanced monitoring technologies and extensive interpretation of multiple databases. Critical Care Time devoted to patient care services described in this note independent of APP/resident  time is 35 minutes.   Kara Mead MD. Shade Flood. [place] [facility] Pager [phone] If no response call [phone]    12/24/2018, 10:27 AM

## 2018-12-24 NOTE — Progress Notes (Signed)
Progress Note  Patient Name: Thomas Adams Date of Encounter: 12/24/2018  Primary Cardiologist: Jenne Campus, MD   Subjective   Remains intubated and sedated. Review of telemetry shows conversion to sinus tachycardia around 12:48 AM this morning. Remains in sinus tachycardia.  Inpatient Medications    Scheduled Meds:  amiodarone  200 mg Per Tube BID   aspirin  81 mg Per Tube Daily   chlorhexidine gluconate (MEDLINE KIT)  15 mL Mouth Rinse BID   Chlorhexidine Gluconate Cloth  6 each Topical Daily   darbepoetin (ARANESP) injection - DIALYSIS  150 mcg Intravenous Q Thu-HD   feeding supplement (PRO-STAT SUGAR FREE 64)  60 mL Per Tube QID   mouth rinse  15 mL Mouth Rinse 10 times per day   pantoprazole sodium  40 mg Per Tube Q24H   sodium chloride flush  10-40 mL Intracatheter Q12H   sodium chloride flush  3 mL Intravenous Once   Continuous Infusions:   prismasol BGK 4/2.5 300 mL/hr at 12/23/18 1938    prismasol BGK 4/2.5 300 mL/hr at 12/23/18 2030   sodium chloride Stopped (12/24/18 0830)   dexmedetomidine (PRECEDEX) IV infusion 0.4 mcg/kg/hr (12/24/18 1200)   feeding supplement (VITAL AF 1.2 CAL) 1,000 mL (12/22/18 1519)   fentaNYL infusion INTRAVENOUS 50 mcg/hr (12/21/18 0452)   nafcillin IV Stopped (12/24/18 0947)   norepinephrine (LEVOPHED) Adult infusion Stopped (12/23/18 1814)   prismasol BGK 4/2.5 1,800 mL/hr at 12/24/18 1124   PRN Meds: sodium chloride, acetaminophen **OR** acetaminophen (TYLENOL) oral liquid 160 mg/5 mL **OR** acetaminophen, albuterol, fentaNYL (SUBLIMAZE) injection, ipratropium, midazolam, senna-docusate, sodium chloride flush   Vital Signs    Vitals:   12/24/18 1000 12/24/18 1100 12/24/18 1120 12/24/18 1200  BP: 106/71 102/73  124/77  Pulse: (!) 107 (!) 105  (!) 112  Resp: (!) 22 (!) 22  (!) 22  Temp: 97.7 F (36.5 C) 97.7 F (36.5 C)  97.7 F (36.5 C)  TempSrc:    Esophageal  SpO2: 96% 96% 96% 91%  Weight:        Height:        Intake/Output Summary (Last 24 hours) at 12/24/2018 1221 Last data filed at 12/24/2018 1200 Gross per 24 hour  Intake 1734.68 ml  Output 1820 ml  Net -85.32 ml   Last 3 Weights 12/19/2018 12/14/2018 12/22/2018  Weight (lbs) 188 lb 0.8 oz 192 lb 3.9 oz 192 lb 3.9 oz  Weight (kg) 85.3 kg 87.2 kg 87.2 kg      Telemetry    Atrial fibrillation with conversion to sinus tachycardia at 12:48 AM- Personally Reviewed  ECG    No new - Personally Reviewed  Physical Exam   GEN: Intubated, sedated Neck: No JVD appreciated, line in R IJ. Cardiac: today tachycardic but regular rhythm, 2/6 HSM Respiratory: ventilated lung sounds, diffusely coarse but somewhat improved from yesterday GI: Soft, nontender, non-distended  MS: No edema; No deformity. Neuro:  moving arms and legs spontaneously, not following my commands Psych: sedated  Labs    Chemistry Recent Labs  Lab 12/12/2018 1139  12/23/18 0524 12/23/18 1649 12/24/18 0540  NA 128*   < > 134* 135 136  K 4.5   < > 4.2 4.1 4.3  CL 80*   < > 99 100 101  CO2 25   < > 21* 22 24  GLUCOSE 101*   < > 123* 138* 137*  BUN 50*   < > 46* 44* 43*  CREATININE 12.49*   < >  6.45* 5.44* 4.11*  CALCIUM 9.3   < > 8.4* 8.6* 8.6*  PROT 6.8  --   --   --   --   ALBUMIN 3.3*   < > 2.1* 1.9* 2.0*  AST 53*  --   --   --   --   ALT 21  --   --   --   --   ALKPHOS 102  --   --   --   --   BILITOT 1.3*  --   --   --   --   GFRNONAA 4*   < > 9* 11* 15*  GFRAA 5*   < > 10* 13* 18*  ANIONGAP 23*   < > _0 < > = values in this interval not displayed.     Hematology Recent Labs  Lab 12/22/18 0537 12/23/18 0524 12/24/18 0540  WBC 15.4* 18.7* 22.9*  RBC 2.76* 2.82* 2.44*  HGB 8.8* 9.0* 7.7*  HCT 25.7* 25.8* 22.6*  MCV 93.1 91.5 92.6  MCH 31.9 31.9 31.6  MCHC 34.2 34.9 34.1  RDW 14.8 14.6 14.8  PLT 54* 67* 83*    Cardiac EnzymesNo results for input(s): TROPONINI in the last 168 hours. No results for input(s): TROPIPOC in  the last 168 hours.   BNPNo results for input(s): BNP, PROBNP in the last 168 hours.   DDimer No results for input(s): DDIMER in the last 168 hours.   Radiology    Dg Chest Port 1 View  Result Date: 12/24/2018 CLINICAL DATA:  Acute respiratory failure. EXAM: PORTABLE CHEST 1 VIEW COMPARISON:  Yesterday. FINDINGS: Endotracheal tube, enteric tube, and right central line remain in place. Persistent low lung volumes. Ill-defined bilateral lung opacities, left greater than right, with slight progression from prior. Worsening retrocardiac and right basilar opacity with more confluent density. No large pleural effusion. No pneumothorax. IMPRESSION: Worsening bilateral perihilar lung opacities, left greater than right. This may be pulmonary edema, ARDS, or multifocal pneumonia. More confluent opacities at the bases is also progressed. Electronically Signed   By: Keith Rake M.D.   On: 12/24/2018 03:58   Dg Chest Port 1 View  Result Date: 12/23/2018 CLINICAL DATA:  58 year old male with cerebral infarcts, sepsis, bacteremia, respiratory failure. EXAM: PORTABLE CHEST 1 VIEW COMPARISON:  12/22/2018 and earlier. FINDINGS: Portable AP semi upright view at 0649 hours. Stable endotracheal tube tip between the level the clavicles and carina. Enteric tube courses to the abdomen, tip not included. Stable right IJ central line. Mildly larger lung volumes and regressed retrocardiac opacity with air bronchograms. The left hemidiaphragm is visible today. Mildly asymmetric increased left lung interstitial opacity is stable. No pneumothorax or new pulmonary opacity. Normal cardiac size and mediastinal contours. Negative visible bowel gas pattern. IMPRESSION: 1. Stable lines and tubes. 2. Mildly larger lung volumes and regressed left lower lobe collapse or consolidation. 3. Asymmetric interstitial opacity in the left lung persists, favor infection over asymmetric edema. 4. No new cardiopulmonary abnormality.  Electronically Signed   By: Genevie Ann M.D.   On: 12/23/2018 08:38    Cardiac Studies   TTE: 01/08/2019  IMPRESSIONS   1. The left ventricle has hyperdynamic systolic function, with an ejection fraction of >65%. The cavity size was normal. There is severe concentric left ventricular hypertrophy. Left ventricular diastolic Doppler parameters are consistent with impaired  relaxation. Elevated left atrial and left ventricular end-diastolic pressures. 2. The right ventricle has normal systolic function. The cavity was normal. There  is no increase in right ventricular wall thickness. 3. Left atrial size was moderately dilated. 4. Right atrial size was mildly dilated. 5. The mitral valve is degenerative. Severe thickening of the mitral valve leaflet. Severe calcification of the mitral valve leaflet. There is severe mitral annular calcification present. Mitral valve regurgitation is severe by color flow Doppler. 6. The aortic valve is tricuspid. Severely thickening of the aortic valve. Severe calcifcation of the aortic valve. Aortic valve regurgitation was not assessed by color flow Doppler.  SUMMARY  Severe concentric LVH, hyperdynamic LVEF. Aortic valve is severely thickened and calcified. There are severe posterior mitral annular calcifications extending into the mitral lefalets with a mobile portion that most probably represents a large calcified mass partially detached from the leaflet. This can possibly be a source of embolism and stroke. Evaluation for TTR amyloidosis with PYP nuclear scan should be considered.  Patient Profile     58 y.o. 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 Leonie Man  Assessment & Plan    1. Afib RVR: converted to sinus tach at 12:48 AM -likely driven by acute illness -continue amiodarone to maintain sinus rhythm -no anticoagulation acutely given risk of hemorrhagic  conversion of embolic CVA -[id number] Stroke Risk Points=4 . Consider anticoagulation in the long term once clear from a neurologic standpoint, pending neuro recovery and long term prognosis -sinus tach likely secondary to acute medical illness. Was on labetalol as outpatient. Once pressure stable (had brief hypotension in last 24 hours), could consider low dose metoprolol  2. Sepsis with MSSA bacteremia: BC + on admission. WBC 20>>15.4. Temp 103 while in the ED.  -- antibiotics per ID, endocarditis as below  3. Mitral valve endocarditis: please see my note/images from 5/13. Meets Modified [facility] Criteria for endocarditis. Would not pursue TEE unless he is a surgical candidate.  TIME SPENT WITH PATIENT: 25 minutes of direct patient care. More than 50% of that time was spent on coordination of care and study review regarding rhythm management and endocarditis.  Buford Dresser, MD, PhD [facility]   For questions or updates, please contact [place] Please consult [email or web address] for contact info under     Signed, Buford Dresser, MD  12/24/2018, 12:21 PM

## 2018-12-24 NOTE — Progress Notes (Signed)
[facility]ious [facility]   Reason for visit: Follow up on bacteremia  Interval History: remains intubated, WBC up to 22.9, afebrile.  No acute events.  On nafcillin now for better CNS coverage.     Physical Exam: Constitutional:  Vitals:   12/24/18 1120 12/24/18 1200  BP:  124/77  Pulse:  (!) 112  Resp:  (!) 22  Temp:  97.7 F (36.5 C)  SpO2: 96% 91%   patient does not respond Eyes: anicteric HENT: +ET Respiratory: respiratory effort on vent Cardiovascular: tachy RR GI: soft, nt, nd MS: left fistula with no warmth  Review of Systems: Unable to be assessed due to patient factors  Lab Results  Component Value Date   WBC 22.9 (H) 12/24/2018   HGB 7.7 (L) 12/24/2018   HCT 22.6 (L) 12/24/2018   MCV 92.6 12/24/2018   PLT 83 (L) 12/24/2018    Lab Results  Component Value Date   CREATININE 4.11 (H) 12/24/2018   BUN 43 (H) 12/24/2018   NA 136 12/24/2018   K 4.3 12/24/2018   CL 101 12/24/2018   CO2 24 12/24/2018    Lab Results  Component Value Date   ALT 21 12/12/2018   AST 53 (H) 01/04/2019   ALKPHOS 102 12/12/2018     Microbiology: Recent Results (from the past 240 hour(s))  SARS Coronavirus 2 (CEPHEID- Performed in [facility] hospital lab), Hosp Order     Status: None   Collection Time: 12/15/2018 11:45 AM  Result Value Ref Range Status   SARS Coronavirus 2 NEGATIVE NEGATIVE Final    Comment: (NOTE) If result is NEGATIVE SARS-CoV-2 target nucleic acids are NOT DETECTED. The SARS-CoV-2 RNA is generally detectable in upper and lower  respiratory specimens during the acute phase of infection. The lowest  concentration of SARS-CoV-2 viral copies this assay can detect is 250  copies / mL. A negative result does not preclude SARS-CoV-2 infection  and should not be used as the sole basis for treatment or other  patient management decisions.  A negative result may occur with  improper specimen collection / handling, submission of specimen other   than nasopharyngeal swab, presence of viral mutation(s) within the  areas targeted by this assay, and inadequate number of viral copies  (<250 copies / mL). A negative result must be combined with clinical  observations, patient history, and epidemiological information. If result is POSITIVE SARS-CoV-2 target nucleic acids are DETECTED. The SARS-CoV-2 RNA is generally detectable in upper and lower  respiratory specimens dur ing the acute phase of infection.  Positive  results are indicative of active infection with SARS-CoV-2.  Clinical  correlation with patient history and other diagnostic information is  necessary to determine patient infection status.  Positive results do  not rule out bacterial infection or co-infection with other viruses. If result is PRESUMPTIVE POSTIVE SARS-CoV-2 nucleic acids MAY BE PRESENT.   A presumptive positive result was obtained on the submitted specimen  and confirmed on repeat testing.  While 2019 novel coronavirus  (SARS-CoV-2) nucleic acids may be present in the submitted sample  additional confirmatory testing may be necessary for epidemiological  and / or clinical management purposes  to differentiate between  SARS-CoV-2 and other Sarbecovirus currently known to infect humans.  If clinically indicated additional testing with an alternate test  methodology [phone]) is advised. The SARS-CoV-2 RNA is generally  detectable in upper and lower respiratory sp ecimens during the acute  phase of infection. The expected result  is Negative. Fact Sheet for Patients:  StrictlyIdeas.no Fact Sheet for Healthcare Providers: BankingDealers.co.za This test is not yet approved or cleared by the [place] FDA and has been authorized for detection and/or diagnosis of SARS-CoV-2 by FDA under an Emergency Use Authorization (EUA).  This EUA will remain in effect (meaning this test can be used) for the duration of the  COVID-19 declaration under Section 564(b)(1) of the Act, 21 U.S.C. section 360bbb-3(b)(1), unless the authorization is terminated or revoked sooner. Performed at [facility], [place] [address] [place], [place] [address]   Blood Culture (routine x 2)     Status: Abnormal   Collection Time: 12/21/2018  1:57 PM  Result Value Ref Range Status   Specimen Description BLOOD RIGHT FOREARM  Final   Special Requests   Final    BOTTLES DRAWN AEROBIC AND ANAEROBIC Blood Culture adequate volume   Culture  Setup Time   Final    GRAM POSITIVE COCCI IN BOTH AEROBIC AND ANAEROBIC BOTTLES CRITICAL RESULT CALLED TO, READ BACK BY AND VERIFIED WITH: J. LEDFORD,PHARMD 1960 12/21/2018 T. TYSOR    Culture (A)  Final    STAPHYLOCOCCUS AUREUS SUSCEPTIBILITIES PERFORMED ON PREVIOUS CULTURE WITHIN THE LAST 5 DAYS. Performed at [facility], [place] [address], [place], [place] [address]    Report Status 12/23/2018 FINAL  Final  Blood Culture (routine x 2)     Status: Abnormal   Collection Time: 12/24/2018  1:58 PM  Result Value Ref Range Status   Specimen Description BLOOD RIGHT HAND  Final   Special Requests   Final    BOTTLES DRAWN AEROBIC AND ANAEROBIC Blood Culture adequate volume   Culture  Setup Time   Final    GRAM POSITIVE COCCI IN BOTH AEROBIC AND ANAEROBIC BOTTLES CRITICAL RESULT CALLED TO, READ BACK BY AND VERIFIED WITH: J. LEDFORD,PHARMD 0209 12/21/2018 Mena Goes Performed at [facility], [place] [address] [place], [place] [address]    Culture STAPHYLOCOCCUS AUREUS (A)  Final   Report Status 12/23/2018 FINAL  Final   Organism ID, Bacteria STAPHYLOCOCCUS AUREUS  Final      Susceptibility   Staphylococcus aureus - MIC*    CIPROFLOXACIN <=0.5 SENSITIVE Sensitive     ERYTHROMYCIN <=0.25 SENSITIVE Sensitive     GENTAMICIN <=0.5 SENSITIVE Sensitive     OXACILLIN <=0.25 SENSITIVE Sensitive     TETRACYCLINE <=1 SENSITIVE Sensitive     VANCOMYCIN <=0.5 SENSITIVE Sensitive      TRIMETH/SULFA <=10 SENSITIVE Sensitive     CLINDAMYCIN <=0.25 SENSITIVE Sensitive     RIFAMPIN <=0.5 SENSITIVE Sensitive     Inducible Clindamycin NEGATIVE Sensitive     * STAPHYLOCOCCUS AUREUS  Blood Culture ID Panel (Reflexed)     Status: Abnormal   Collection Time: 12/29/2018  1:58 PM  Result Value Ref Range Status   Enterococcus species NOT DETECTED NOT DETECTED Final   Listeria monocytogenes NOT DETECTED NOT DETECTED Final   Staphylococcus species DETECTED (A) NOT DETECTED Final    Comment: CRITICAL RESULT CALLED TO, READ BACK BY AND VERIFIED WITH: J. LEDFORD,PHARMD 0209 12/21/2018 T. TYSOR    Staphylococcus aureus (BCID) DETECTED (A) NOT DETECTED Final    Comment: Methicillin (oxacillin) susceptible Staphylococcus aureus (MSSA). Preferred therapy is anti staphylococcal beta lactam antibiotic (Cefazolin or Nafcillin), unless clinically contraindicated. CRITICAL RESULT CALLED TO, READ BACK BY AND VERIFIED WITH: J. LEDFORD,PHARMD 0209 12/21/2018 T. TYSOR    Methicillin resistance NOT DETECTED NOT DETECTED Final   Streptococcus species  NOT DETECTED NOT DETECTED Final   Streptococcus agalactiae NOT DETECTED NOT DETECTED Final   Streptococcus pneumoniae NOT DETECTED NOT DETECTED Final   Streptococcus pyogenes NOT DETECTED NOT DETECTED Final   Acinetobacter baumannii NOT DETECTED NOT DETECTED Final   Enterobacteriaceae species NOT DETECTED NOT DETECTED Final   Enterobacter cloacae complex NOT DETECTED NOT DETECTED Final   Escherichia coli NOT DETECTED NOT DETECTED Final   Klebsiella oxytoca NOT DETECTED NOT DETECTED Final   Klebsiella pneumoniae NOT DETECTED NOT DETECTED Final   Proteus species NOT DETECTED NOT DETECTED Final   Serratia marcescens NOT DETECTED NOT DETECTED Final   Haemophilus influenzae NOT DETECTED NOT DETECTED Final   Neisseria meningitidis NOT DETECTED NOT DETECTED Final   Pseudomonas aeruginosa NOT DETECTED NOT DETECTED Final   Candida albicans NOT DETECTED NOT  DETECTED Final   Candida glabrata NOT DETECTED NOT DETECTED Final   Candida krusei NOT DETECTED NOT DETECTED Final   Candida parapsilosis NOT DETECTED NOT DETECTED Final   Candida tropicalis NOT DETECTED NOT DETECTED Final    Comment: Performed at [facility], [place] [address], [place], [place] [address]  MRSA PCR Screening     Status: None   Collection Time: 12/16/2018  3:15 PM  Result Value Ref Range Status   MRSA by PCR NEGATIVE NEGATIVE Final    Comment:        The GeneXpert MRSA Assay (FDA approved for NASAL specimens only), is one component of a comprehensive MRSA colonization surveillance program. It is not intended to diagnose MRSA infection nor to guide or monitor treatment for MRSA infections. Performed at [facility], [place] [address], [place], [place] [address]   Urine Culture     Status: None   Collection Time: 12/21/18 11:04 PM  Result Value Ref Range Status   Specimen Description URINE, CATHETERIZED  Final   Special Requests NONE  Final   Culture   Final    NO GROWTH Performed at [facility], [address], [place], [place] [address]    Report Status 12/22/2018 FINAL  Final  Culture, blood (routine x 2)     Status: None (Preliminary result)   Collection Time: 12/23/18  6:14 AM  Result Value Ref Range Status   Specimen Description BLOOD RIGHT ARM  Final   Special Requests   Final    BOTTLES DRAWN AEROBIC ONLY Blood Culture adequate volume   Culture   Final    NO GROWTH 1 DAY Performed at [facility], [place] [address], [place], [place] [address]    Report Status PENDING  Incomplete  Culture, blood (routine x 2)     Status: None (Preliminary result)   Collection Time: 12/23/18  6:50 AM  Result Value Ref Range Status   Specimen Description BLOOD RIGHT ARM  Final   Special Requests   Final    BOTTLES DRAWN AEROBIC ONLY Blood Culture adequate volume   Culture   Final    NO GROWTH 1 DAY Performed at [facility], [place] [address], [place], [place] [address]    Report Status PENDING  Incomplete    Impression/Plan:  1. Bacteremia - on nafcillin, no changes.    2.  CNS emboli - possibly infectious origin and from IE of valves.    3.  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  Also AV thickening and calcifications.   I agree that likely endocarditis and he will get 6 weeks of  IV antbiotics regardless, even with a negative TEE so from an infectious disease standpoint, no absolute indication for TEE.

## 2018-12-24 NOTE — Progress Notes (Signed)
Subjective:  Remains on CRRT- ran even last 24 hours - some resp difficulties overnight- vent adjustments made - off pressors since yesterday - tmax 100.4   Objective Vital signs in last 24 hours: Vitals:   12/24/18 0600 12/24/18 0700 12/24/18 0800 12/24/18 0821  BP: 118/82 126/83 106/81   Pulse:   (!) 117   Resp: (!) 22 (!) 22 (!) 26   Temp: 98.4 F (36.9 C) 97.9 F (36.6 C) (!) 97.5 F (36.4 C)   TempSrc:      SpO2:   97% 100%  Weight:      Height:       Weight change:   Intake/Output Summary (Last 24 hours) at 12/24/2018 0846 Last data filed at 12/24/2018 0800 Gross per 24 hour  Intake 1697.69 ml  Output 1803 ml  Net -105.31 ml    Assessment/ Plan: Pt is a 58 y.o. yo male who was admitted on 12/18/2018 with an acute ischemic stroke , then found to have an MSSA bacteremia   Assessment/Plan: 1. Acute CVA- supportive care- concern for septic emboli- TEE pending 2. MSSA bacteremia- on nafcillin - ID following, WBC still quite high - TEE pending?  AVF noted to have recent intervention but no evidence of overt infection- has not been investigated further  3. ESRD-  Normally TTS  Has been on CRRT since 5/12- now more hemodynamically stable- will look to transition to IHD- stop CRRT today- probably next HD on Friday/Sat  4. Secondary hyperparathyroidism- calcium is OK - Phos is low - no binder  5. HTN/volume- volume and BP seem OK- no BP meds- running even until CRRT is to be stopped  6. Anemia- hgb trending down - also platelets down- will stop heparin thru CRRT - also give Aranesp  7. VDRF-  Possibly approaching extubation ?   Thomas Adams    Labs: Basic Metabolic Panel: Recent Labs  Lab 12/23/18 0524 12/23/18 1649 12/24/18 0540  NA 134* 135 136  K 4.2 4.1 4.3  CL 99 100 101  CO2 21* 22 24  GLUCOSE 123* 138* 137*  BUN 46* 44* 43*  CREATININE 6.45* 5.44* 4.11*  CALCIUM 8.4* 8.6* 8.6*  PHOS 3.1  3.1 2.4*  2.4* 2.6  2.6   Liver Function Tests: Recent  Labs  Lab 12/29/2018 1139  12/23/18 0524 12/23/18 1649 12/24/18 0540  AST 53*  --   --   --   --   ALT 21  --   --   --   --   ALKPHOS 102  --   --   --   --   BILITOT 1.3*  --   --   --   --   PROT 6.8  --   --   --   --   ALBUMIN 3.3*   < > 2.1* 1.9* 2.0*   < > = values in this interval not displayed.   Recent Labs  Lab 12/22/18 0945  LIPASE 30   No results for input(s): AMMONIA in the last 168 hours. CBC: Recent Labs  Lab 12/15/2018 1139 12/22/18 0537 12/23/18 0524 12/24/18 0540  WBC 20.2* 15.4* 18.7* 22.9*  NEUTROABS 17.7*  --   --   --   HGB 11.0* 8.8* 9.0* 7.7*  HCT 31.6* 25.7* 25.8* 22.6*  MCV 92.1 93.1 91.5 92.6  PLT 75* 54* 67* 83*   Cardiac Enzymes: No results for input(s): CKTOTAL, CKMB, CKMBINDEX, TROPONINI in the last 168 hours. CBG: Recent Labs  Lab  12/23/18 1539 12/23/18 2014 12/24/18 0016 12/24/18 0424 12/24/18 0734  GLUCAP 141* 111* 110* 100* 109*    Iron Studies: No results for input(s): IRON, TIBC, TRANSFERRIN, FERRITIN in the last 72 hours. Studies/Results: Dg Chest Port 1 View  Result Date: 12/24/2018 CLINICAL DATA:  Acute respiratory failure. EXAM: PORTABLE CHEST 1 VIEW COMPARISON:  Yesterday. FINDINGS: Endotracheal tube, enteric tube, and right central line remain in place. Persistent low lung volumes. Ill-defined bilateral lung opacities, left greater than right, with slight progression from prior. Worsening retrocardiac and right basilar opacity with more confluent density. No large pleural effusion. No pneumothorax. IMPRESSION: Worsening bilateral perihilar lung opacities, left greater than right. This may be pulmonary edema, ARDS, or multifocal pneumonia. More confluent opacities at the bases is also progressed. Electronically Signed   By: Keith Rake M.D.   On: 12/24/2018 03:58   Dg Chest Port 1 View  Result Date: 12/23/2018 CLINICAL DATA:  58 year old male with cerebral infarcts, sepsis, bacteremia, respiratory failure. EXAM:  PORTABLE CHEST 1 VIEW COMPARISON:  12/22/2018 and earlier. FINDINGS: Portable AP semi upright view at 0649 hours. Stable endotracheal tube tip between the level the clavicles and carina. Enteric tube courses to the abdomen, tip not included. Stable right IJ central line. Mildly larger lung volumes and regressed retrocardiac opacity with air bronchograms. The left hemidiaphragm is visible today. Mildly asymmetric increased left lung interstitial opacity is stable. No pneumothorax or new pulmonary opacity. Normal cardiac size and mediastinal contours. Negative visible bowel gas pattern. IMPRESSION: 1. Stable lines and tubes. 2. Mildly larger lung volumes and regressed left lower lobe collapse or consolidation. 3. Asymmetric interstitial opacity in the left lung persists, favor infection over asymmetric edema. 4. No new cardiopulmonary abnormality. Electronically Signed   By: Genevie Ann M.D.   On: 12/23/2018 08:38   Medications: Infusions: .  prismasol BGK 4/2.5 300 mL/hr at 12/23/18 1938  .  prismasol BGK 4/2.5 300 mL/hr at 12/23/18 2030  . sodium chloride Stopped (12/24/18 0752)  . dexmedetomidine (PRECEDEX) IV infusion Stopped (12/22/18 1520)  . feeding supplement (VITAL AF 1.2 CAL) 1,000 mL (12/22/18 1519)  . fentaNYL infusion INTRAVENOUS 50 mcg/hr (12/21/18 0452)  . nafcillin IV 200 mL/hr at 12/24/18 0800  . norepinephrine (LEVOPHED) Adult infusion Stopped (12/23/18 1814)  . prismasol BGK 4/2.5 1,800 mL/hr at 12/24/18 9892    Scheduled Medications: . amiodarone  200 mg Per Tube BID  . aspirin  81 mg Per Tube Daily  . chlorhexidine gluconate (MEDLINE KIT)  15 mL Mouth Rinse BID  . Chlorhexidine Gluconate Cloth  6 each Topical Daily  . feeding supplement (PRO-STAT SUGAR FREE 64)  60 mL Per Tube QID  . mouth rinse  15 mL Mouth Rinse 10 times per day  . pantoprazole sodium  40 mg Per Tube Q24H  . sodium chloride flush  10-40 mL Intracatheter Q12H  . sodium chloride flush  3 mL Intravenous Once     have reviewed scheduled and prn medications.  Physical Exam: General: agitated- followed commands for neuro  Heart: tachy Lungs: mostly clear Abdomen: soft, non tender Extremities: mild pitting edema  Dialysis Access: right AVF patent- also with right IJ temp HD cath for CRRT     12/24/2018,8:46 AM  LOS: 4 days

## 2018-12-24 NOTE — Progress Notes (Signed)
eLink Physician-Brief Progress Note Patient Name: Thomas Adams DOB: 11/11/1960 MRN: [id number]   Date of Service  12/24/2018  HPI/Events of Note  Tachypnea and desaturation, airway pressures not materially different, suction underwhelming, no evidence of ET tube dislodgement, BBS =  eICU Interventions  Stat portable CXR, transiently increase fiO2 pending normalization of O2 sats.        Kerry Kass [name] 12/24/2018, 3:21 AM

## 2018-12-24 NOTE — Progress Notes (Signed)
Pt CRRT clot at 1430, HD cath Heparin locked and pt stable. At 1530 pt became hypotensive and tachypneic. Levophed restarted and pt given fentanyl for agitation. Pt continued to be hypotensive with increasing levophed. After initiation of levophed pt went in to Afib RVR in 140-160s. MD paged and aware of BP and HR. At 1800 order received for a 150 mL bolus. Instructed to call cardiology if HR remains elevated. Will continue to monitor pt.

## 2018-12-25 ENCOUNTER — Inpatient Hospital Stay (HOSPITAL_COMMUNITY): Payer: Medicare Other

## 2018-12-25 DIAGNOSIS — R197 Diarrhea, unspecified: Secondary | ICD-10-CM

## 2018-12-25 DIAGNOSIS — Z978 Presence of other specified devices: Secondary | ICD-10-CM

## 2018-12-25 LAB — RENAL FUNCTION PANEL
Albumin: 1.7 g/dL — ABNORMAL LOW (ref 3.5–5.0)
Albumin: 1.6 g/dL — ABNORMAL LOW (ref 3.5–5.0)
Anion gap: 12 (ref 5–15)
Anion gap: 11 (ref 5–15)
BUN: 83 mg/dL — ABNORMAL HIGH (ref 6–20)
BUN: 105 mg/dL — ABNORMAL HIGH (ref 6–20)
CO2: 22 mmol/L (ref 22–32)
CO2: 25 mmol/L (ref 22–32)
Calcium: 8.8 mg/dL — ABNORMAL LOW (ref 8.9–10.3)
Calcium: 9 mg/dL (ref 8.9–10.3)
Chloride: 102 mmol/L (ref 98–111)
Chloride: 102 mmol/L (ref 98–111)
Creatinine, Ser: 5.4 mg/dL — ABNORMAL HIGH (ref 0.61–1.24)
Creatinine, Ser: 6.22 mg/dL — ABNORMAL HIGH (ref 0.61–1.24)
GFR calc Af Amer: 13 mL/min — ABNORMAL LOW (ref >60)
GFR calc Af Amer: 11 mL/min — ABNORMAL LOW (ref >60)
GFR calc non Af Amer: 11 mL/min — ABNORMAL LOW (ref >60)
GFR calc non Af Amer: 9 mL/min — ABNORMAL LOW (ref >60)
Glucose, Bld: 142 mg/dL — ABNORMAL HIGH (ref 70–99)
Glucose, Bld: 121 mg/dL — ABNORMAL HIGH (ref 70–99)
Phosphorus: 4.1 mg/dL (ref 2.5–4.6)
Phosphorus: 4.3 mg/dL (ref 2.5–4.6)
Potassium: 4.4 mmol/L (ref 3.5–5.1)
Potassium: 4.7 mmol/L (ref 3.5–5.1)
Sodium: 136 mmol/L (ref 135–145)
Sodium: 138 mmol/L (ref 135–145)

## 2018-12-25 LAB — CBC
HCT: 19.9 % — ABNORMAL LOW (ref 39.0–52.0)
Hemoglobin: 6.9 g/dL — CL (ref 13.0–17.0)
MCH: 31.8 pg (ref 26.0–34.0)
MCHC: 34.7 g/dL (ref 30.0–36.0)
MCV: 91.7 fL (ref 80.0–100.0)
Platelets: 121 10*3/uL — ABNORMAL LOW (ref 150–400)
RBC: 2.17 MIL/uL — ABNORMAL LOW (ref 4.22–5.81)
RDW: 14.9 % (ref 11.5–15.5)
WBC: 22.9 10*3/uL — ABNORMAL HIGH (ref 4.0–10.5)
nRBC: 0 % (ref 0.0–0.2)

## 2018-12-25 LAB — HEMOGLOBIN AND HEMATOCRIT, BLOOD
HCT: 22.6 % — ABNORMAL LOW (ref 39.0–52.0)
Hemoglobin: 7.7 g/dL — ABNORMAL LOW (ref 13.0–17.0)

## 2018-12-25 LAB — GLUCOSE, CAPILLARY
Glucose-Capillary: 123 mg/dL — ABNORMAL HIGH (ref 70–99)
Glucose-Capillary: 143 mg/dL — ABNORMAL HIGH (ref 70–99)
Glucose-Capillary: 108 mg/dL — ABNORMAL HIGH (ref 70–99)
Glucose-Capillary: 102 mg/dL — ABNORMAL HIGH (ref 70–99)
Glucose-Capillary: 117 mg/dL — ABNORMAL HIGH (ref 70–99)
Glucose-Capillary: 131 mg/dL — ABNORMAL HIGH (ref 70–99)

## 2018-12-25 LAB — MAGNESIUM: Magnesium: 2.6 mg/dL — ABNORMAL HIGH (ref 1.7–2.4)

## 2018-12-25 LAB — PREPARE RBC (CROSSMATCH)

## 2018-12-25 MED ORDER — SODIUM CHLORIDE 0.9% IV SOLUTION
Freq: Once | INTRAVENOUS | Status: AC
  Administered 2018-12-25: 12:00:00 via INTRAVENOUS

## 2018-12-25 MED ORDER — CHLORHEXIDINE GLUCONATE CLOTH 2 % EX PADS: 6.0000 | MEDICATED_PAD | Freq: Every day | CUTANEOUS | Status: DC

## 2018-12-25 MED ORDER — DIPHENHYDRAMINE HCL 50 MG/ML IJ SOLN
25.0000 mg | Freq: Once | INTRAMUSCULAR | Status: AC
  Administered 2018-12-25: 09:00:00 25 mg via INTRAVENOUS
  Filled 2018-12-25: qty 1

## 2018-12-25 MED ORDER — MIDAZOLAM HCL 2 MG/2ML IJ SOLN
1.0000 mg | INTRAMUSCULAR | Status: DC | PRN
  Administered 2018-12-25: 10:00:00 2 mg via INTRAVENOUS
  Administered 2018-12-25: 23:00:00 1 mg via INTRAVENOUS
  Administered 2018-12-25 – 2018-12-28 (×6): 2 mg via INTRAVENOUS
  Filled 2018-12-25 (×8): qty 2

## 2018-12-25 MED ORDER — DEXMEDETOMIDINE HCL IN NACL 200 MCG/50ML IV SOLN
0.4000 ug/kg/h | INTRAVENOUS | Status: DC
  Administered 2018-12-25: 15:00:00 0.8 ug/kg/h via INTRAVENOUS
  Administered 2018-12-25: 18:00:00 0.6 ug/kg/h via INTRAVENOUS
  Administered 2018-12-25: 21:00:00 1.2 ug/kg/h via INTRAVENOUS
  Administered 2018-12-25: 1.1 ug/kg/h via INTRAVENOUS
  Administered 2018-12-26: 02:00:00 1.2 ug/kg/h via INTRAVENOUS
  Administered 2018-12-26: 07:00:00 2 ug/kg/h via INTRAVENOUS
  Administered 2018-12-26: 08:00:00 1 ug/kg/h via INTRAVENOUS
  Administered 2018-12-26: 06:00:00 2 ug/kg/h via INTRAVENOUS
  Administered 2018-12-26: 04:00:00 1.2 ug/kg/h via INTRAVENOUS
  Filled 2018-12-25 (×9): qty 50

## 2018-12-25 MED ORDER — MIDAZOLAM HCL 2 MG/2ML IJ SOLN
INTRAMUSCULAR | Status: AC
  Filled 2018-12-25: qty 2

## 2018-12-25 MED ORDER — METOPROLOL TARTRATE 5 MG/5ML IV SOLN
2.5000 mg | INTRAVENOUS | Status: DC | PRN
  Administered 2018-12-26: 06:00:00 5 mg via INTRAVENOUS
  Administered 2018-12-26: 02:00:00 2.5 mg via INTRAVENOUS
  Administered 2018-12-28: 5 mg via INTRAVENOUS
  Filled 2018-12-25 (×2): qty 5

## 2018-12-25 NOTE — Progress Notes (Signed)
SLP Cancellation Note  Patient Details Name: Thomas Adams MRN: [id number] DOB: November 06, 1960   Cancelled treatment:       Reason Eval/Treat Not Completed: Medical issues which prohibited therapy(Pt remains intubated at this time. SLP will follow up. )  Khayree Delellis I. Hardin Negus, [place], [place] Office number [phone] Pager [phone]  Horton Marshall 12/25/2018, 7:53 AM

## 2018-12-25 NOTE — Progress Notes (Signed)
E-Link MD Deterding paged about patient's heart rate sustaining in the 140s and 150s. MD made aware patient's sedative precedex was running at max. MD aware of norepinephrine running at 2 mcg/min.  I gave PRN fentanyl to help with sedation and pain, and this helped with heart rate greatly. New PRN order for metoprol 2.5-5 mg IV to control patient's heart rate should it be needed. Will continue to monitor.

## 2018-12-25 NOTE — Progress Notes (Signed)
[facility] for Infectious Disease   Reason for visit: F/U MSSA Bacteremia, MV endocarditis   Interval History: remains intubated, WBC up to 22.9, afebrile.  No acute events.  On nafcillin now for better CNS coverage.     Physical Exam: Constitutional: unresponsive, sedated on ventilator Vitals:   12/25/18 1003 12/25/18 1030  BP: (!) 136/119 97/62  Pulse:  (!) 125  Resp:  14  Temp:    SpO2:  97%   Eyes: anicteric, pupils equal 2-3+ and reactive b/l  HENT: +ET, R IJ HD Catheter clean and dry  Respiratory: breathing over vent on full support FiO2 100%? Cardiovascular: tachy RR GI: soft, nt, nd, diarrhea noted in rectal tube. TF infusing MS: left fistula with no warmth  Review of Systems: Unable to be assessed due to intubation/sedation   Lab Results  Component Value Date   WBC 22.9 (H) 12/25/2018   HGB 6.9 (LL) 12/25/2018   HCT 19.9 (L) 12/25/2018   MCV 91.7 12/25/2018   PLT 121 (L) 12/25/2018    Lab Results  Component Value Date   CREATININE 5.40 (H) 12/25/2018   BUN 83 (H) 12/25/2018   NA 136 12/25/2018   K 4.4 12/25/2018   CL 102 12/25/2018   CO2 22 12/25/2018    Lab Results  Component Value Date   ALT 21 12/19/2018   AST 53 (H) 12/14/2018   ALKPHOS 102 12/14/2018     Microbiology: Recent Results (from the past 240 hour(s))  SARS Coronavirus 2 (CEPHEID- Performed in [place] [facility] hospital lab), Hosp Order     Status: None   Collection Time: 12/28/2018 11:45 AM  Result Value Ref Range Status   SARS Coronavirus 2 NEGATIVE NEGATIVE Final    Comment: (NOTE) If result is NEGATIVE SARS-CoV-2 target nucleic acids are NOT DETECTED. The SARS-CoV-2 RNA is generally detectable in upper and lower  respiratory specimens during the acute phase of infection. The lowest  concentration of SARS-CoV-2 viral copies this assay can detect is 250  copies / mL. A negative result does not preclude SARS-CoV-2 infection  and should not be used as the sole basis for  treatment or other  patient management decisions.  A negative result may occur with  improper specimen collection / handling, submission of specimen other  than nasopharyngeal swab, presence of viral mutation(s) within the  areas targeted by this assay, and inadequate number of viral copies  (<250 copies / mL). A negative result must be combined with clinical  observations, patient history, and epidemiological information. If result is POSITIVE SARS-CoV-2 target nucleic acids are DETECTED. The SARS-CoV-2 RNA is generally detectable in upper and lower  respiratory specimens dur ing the acute phase of infection.  Positive  results are indicative of active infection with SARS-CoV-2.  Clinical  correlation with patient history and other diagnostic information is  necessary to determine patient infection status.  Positive results do  not rule out bacterial infection or co-infection with other viruses. If result is PRESUMPTIVE POSTIVE SARS-CoV-2 nucleic acids MAY BE PRESENT.   A presumptive positive result was obtained on the submitted specimen  and confirmed on repeat testing.  While 2019 novel coronavirus  (SARS-CoV-2) nucleic acids may be present in the submitted sample  additional confirmatory testing may be necessary for epidemiological  and / or clinical management purposes  to differentiate between  SARS-CoV-2 and other Sarbecovirus currently known to infect humans.  If clinically indicated additional testing with an alternate test  methodology [phone]) is advised.  The SARS-CoV-2 RNA is generally  detectable in upper and lower respiratory sp ecimens during the acute  phase of infection. The expected result is Negative. Fact Sheet for Patients:  StrictlyIdeas.no Fact Sheet for Healthcare Providers: BankingDealers.co.za This test is not yet approved or cleared by the [place] FDA and has been authorized for detection and/or  diagnosis of SARS-CoV-2 by FDA under an Emergency Use Authorization (EUA).  This EUA will remain in effect (meaning this test can be used) for the duration of the COVID-19 declaration under Section 564(b)(1) of the Act, 21 U.S.C. section 360bbb-3(b)(1), unless the authorization is terminated or revoked sooner. Performed at [facility], [place] [address], [place], [place] [address]   Blood Culture (routine x 2)     Status: Abnormal   Collection Time: 12/27/2018  1:57 PM  Result Value Ref Range Status   Specimen Description BLOOD RIGHT FOREARM  Final   Special Requests   Final    BOTTLES DRAWN AEROBIC AND ANAEROBIC Blood Culture adequate volume   Culture  Setup Time   Final    GRAM POSITIVE COCCI IN BOTH AEROBIC AND ANAEROBIC BOTTLES CRITICAL RESULT CALLED TO, READ BACK BY AND VERIFIED WITH: J. LEDFORD,PHARMD 0981 12/21/2018 T. TYSOR    Culture (A)  Final    STAPHYLOCOCCUS AUREUS SUSCEPTIBILITIES PERFORMED ON PREVIOUS CULTURE WITHIN THE LAST 5 DAYS. Performed at [facility], [place] [address] [place], [place] [address]    Report Status 12/23/2018 FINAL  Final  Blood Culture (routine x 2)     Status: Abnormal   Collection Time: 01/10/2019  1:58 PM  Result Value Ref Range Status   Specimen Description BLOOD RIGHT HAND  Final   Special Requests   Final    BOTTLES DRAWN AEROBIC AND ANAEROBIC Blood Culture adequate volume   Culture  Setup Time   Final    GRAM POSITIVE COCCI IN BOTH AEROBIC AND ANAEROBIC BOTTLES CRITICAL RESULT CALLED TO, READ BACK BY AND VERIFIED WITH: J. LEDFORD,PHARMD 0209 12/21/2018 Mena Goes Performed at [facility], [place] [address], [place], [place] [address]    Culture STAPHYLOCOCCUS AUREUS (A)  Final   Report Status 12/23/2018 FINAL  Final   Organism ID, Bacteria STAPHYLOCOCCUS AUREUS  Final      Susceptibility   Staphylococcus aureus - MIC*    CIPROFLOXACIN <=0.5 SENSITIVE Sensitive     ERYTHROMYCIN <=0.25 SENSITIVE Sensitive     GENTAMICIN  <=0.5 SENSITIVE Sensitive     OXACILLIN <=0.25 SENSITIVE Sensitive     TETRACYCLINE <=1 SENSITIVE Sensitive     VANCOMYCIN <=0.5 SENSITIVE Sensitive     TRIMETH/SULFA <=10 SENSITIVE Sensitive     CLINDAMYCIN <=0.25 SENSITIVE Sensitive     RIFAMPIN <=0.5 SENSITIVE Sensitive     Inducible Clindamycin NEGATIVE Sensitive     * STAPHYLOCOCCUS AUREUS  Blood Culture ID Panel (Reflexed)     Status: Abnormal   Collection Time: 12/19/2018  1:58 PM  Result Value Ref Range Status   Enterococcus species NOT DETECTED NOT DETECTED Final   Listeria monocytogenes NOT DETECTED NOT DETECTED Final   Staphylococcus species DETECTED (A) NOT DETECTED Final    Comment: CRITICAL RESULT CALLED TO, READ BACK BY AND VERIFIED WITH: J. LEDFORD,PHARMD 0209 12/21/2018 T. TYSOR    Staphylococcus aureus (BCID) DETECTED (A) NOT DETECTED Final    Comment: Methicillin (oxacillin) susceptible Staphylococcus aureus (MSSA). Preferred therapy is anti staphylococcal beta lactam antibiotic (Cefazolin or Nafcillin), unless clinically contraindicated. CRITICAL RESULT CALLED TO, READ BACK  BY AND VERIFIED WITH: J. LEDFORD,PHARMD 0209 12/21/2018 T. TYSOR    Methicillin resistance NOT DETECTED NOT DETECTED Final   Streptococcus species NOT DETECTED NOT DETECTED Final   Streptococcus agalactiae NOT DETECTED NOT DETECTED Final   Streptococcus pneumoniae NOT DETECTED NOT DETECTED Final   Streptococcus pyogenes NOT DETECTED NOT DETECTED Final   Acinetobacter baumannii NOT DETECTED NOT DETECTED Final   Enterobacteriaceae species NOT DETECTED NOT DETECTED Final   Enterobacter cloacae complex NOT DETECTED NOT DETECTED Final   Escherichia coli NOT DETECTED NOT DETECTED Final   Klebsiella oxytoca NOT DETECTED NOT DETECTED Final   Klebsiella pneumoniae NOT DETECTED NOT DETECTED Final   Proteus species NOT DETECTED NOT DETECTED Final   Serratia marcescens NOT DETECTED NOT DETECTED Final   Haemophilus influenzae NOT DETECTED NOT DETECTED  Final   Neisseria meningitidis NOT DETECTED NOT DETECTED Final   Pseudomonas aeruginosa NOT DETECTED NOT DETECTED Final   Candida albicans NOT DETECTED NOT DETECTED Final   Candida glabrata NOT DETECTED NOT DETECTED Final   Candida krusei NOT DETECTED NOT DETECTED Final   Candida parapsilosis NOT DETECTED NOT DETECTED Final   Candida tropicalis NOT DETECTED NOT DETECTED Final    Comment: Performed at [facility], [place]. [address] [place], [place] [address]  MRSA PCR Screening     Status: None   Collection Time: 12/30/2018  3:15 PM  Result Value Ref Range Status   MRSA by PCR NEGATIVE NEGATIVE Final    Comment:        The GeneXpert MRSA Assay (FDA approved for NASAL specimens only), is one component of a comprehensive MRSA colonization surveillance program. It is not intended to diagnose MRSA infection nor to guide or monitor treatment for MRSA infections. Performed at [facility], [place] [address], [place], [place] [address]   Urine Culture     Status: None   Collection Time: 12/21/18 11:04 PM  Result Value Ref Range Status   Specimen Description URINE, CATHETERIZED  Final   Special Requests NONE  Final   Culture   Final    NO GROWTH Performed at [facility], [address], [place], [place] [address]    Report Status 12/22/2018 FINAL  Final  Culture, blood (routine x 2)     Status: None (Preliminary result)   Collection Time: 12/23/18  6:14 AM  Result Value Ref Range Status   Specimen Description BLOOD RIGHT ARM  Final   Special Requests   Final    BOTTLES DRAWN AEROBIC ONLY Blood Culture adequate volume   Culture   Final    NO GROWTH 2 DAYS Performed at [facility], [place] [address], [place], [place] [address]    Report Status PENDING  Incomplete  Culture, blood (routine x 2)     Status: None (Preliminary result)   Collection Time: 12/23/18  6:50 AM  Result Value Ref Range Status   Specimen Description BLOOD RIGHT ARM  Final   Special Requests    Final    BOTTLES DRAWN AEROBIC ONLY Blood Culture adequate volume   Culture   Final    NO GROWTH 2 DAYS Performed at [facility], [address] [place], [place] [address]    Report Status PENDING  Incomplete    Impression/Plan:   1. 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 On nafcillin tolerating well with no signs of allergic reaction. TTE with severe MR  and degenerated valve. Repeat BCx no growth 3d thus far. Continue current plan with inpatient nafcillin. 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   2. CNS emboli - given context of bacteremia high suspicion for mitral valve endocarditis. Would consult CT surgery regarding opinion about valve replacement once we know better what trajectory of recovery looks like given findings on transthoracic echo, staph aureus left sided endocarditis. Continue nafcillin for treatment of CNS emboli.   3. 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  Also AV thickening and calcifications.    4. Medication Monitoring = WBC remains elevated but stable, likely reflects burden of infection.    Janene Madeira, MSN, NP-C [facility] Infectious Disease [email or web address] Pager: [phone] Office: [phone] [place]: [phone]

## 2018-12-25 NOTE — Progress Notes (Signed)
Progress Note  Patient Name: Thomas Adams Date of Encounter: 12/25/2018  Primary Cardiologist: Jenne Campus, MD   Subjective   Remains intubated and sedated. Back in atrial fib on my evaluation. Appear since yesterday afternoon and today, he has gone between afib and sinus.  Inpatient Medications    Scheduled Meds: . amiodarone  200 mg Per Tube BID  . aspirin  81 mg Per Tube Daily  . chlorhexidine gluconate (MEDLINE KIT)  15 mL Mouth Rinse BID  . Chlorhexidine Gluconate Cloth  6 each Topical Daily  . Chlorhexidine Gluconate Cloth  6 each Topical Q0600  . darbepoetin (ARANESP) injection - DIALYSIS  150 mcg Intravenous Q Thu-HD  . feeding supplement (PRO-STAT SUGAR FREE 64)  60 mL Per Tube QID  . mouth rinse  15 mL Mouth Rinse 10 times per day  . pantoprazole sodium  40 mg Per Tube Q24H  . sodium chloride flush  10-40 mL Intracatheter Q12H  . sodium chloride flush  3 mL Intravenous Once   Continuous Infusions: .  prismasol BGK 4/2.5 300 mL/hr at 12/24/18 1306  .  prismasol BGK 4/2.5 300 mL/hr at 12/24/18 1342  . sodium chloride Stopped (12/25/18 1548)  . dexmedetomidine (PRECEDEX) IV infusion 0.8 mcg/kg/hr (12/25/18 1600)  . feeding supplement (VITAL AF 1.2 CAL) 1,000 mL (12/25/18 0438)  . fentaNYL infusion INTRAVENOUS 50 mcg/hr (12/21/18 0452)  . nafcillin IV Stopped (12/25/18 1517)  . norepinephrine (LEVOPHED) Adult infusion 5 mcg/min (12/25/18 1147)   PRN Meds: sodium chloride, acetaminophen **OR** acetaminophen (TYLENOL) oral liquid 160 mg/5 mL **OR** acetaminophen, albuterol, fentaNYL (SUBLIMAZE) injection, ipratropium, midazolam, senna-docusate, sodium chloride flush   Vital Signs    Vitals:   12/25/18 1300 12/25/18 1400 12/25/18 1449 12/25/18 1530  BP: 101/60 101/60    Pulse: 95 92 88   Resp: _0 Temp:   97.9 F (36.6 C) 98.2 F (36.8 C)  TempSrc:   Axillary Axillary  SpO2: 96% 94% 90% 93%  Weight:      Height:        Intake/Output Summary  (Last 24 hours) at 12/25/2018 1628 Last data filed at 12/25/2018 1600 Gross per 24 hour  Intake 2805.45 ml  Output 37 ml  Net 2768.45 ml   Last 3 Weights 12/25/2018 12/30/2018 12/11/2018  Weight (lbs) 211 lb 13.8 oz 188 lb 0.8 oz 192 lb 3.9 oz  Weight (kg) 96.1 kg 85.3 kg 87.2 kg      Telemetry    Atrial fibrillation alternating with sinus tachycardia- Personally Reviewed  ECG    No new - Personally Reviewed  Physical Exam   GEN: Intubated, sedated Neck: No JVD appreciated, line in R IJ. Cardiac: today tachycardic and irregular rhythm, 2/6 HSM Respiratory: ventilated lung sounds GI: Soft, nontender, non-distended  MS: No edema; No deformity. Neuro:  moving arms and legs spontaneously, not following my commands Psych: sedated  Labs    Chemistry Recent Labs  Lab 01/01/2019 1139  12/24/18 0540 12/24/18 1730 12/25/18 0500  NA 128*   < > 136 137 136  K 4.5   < > 4.3 4.6 4.4  CL 80*   < > 101 101 102  CO2 25   < > _1 GLUCOSE 101*   < > 137* 123* 142*  BUN 50*   < > 43* 52* 83*  CREATININE 12.49*   < > 4.11* 4.23* 5.40*  CALCIUM 9.3   < > 8.6* 8.7* 8.8*  PROT 6.8  --   --   --   --  ALBUMIN 3.3*   < > 2.0* 1.7* 1.7*  AST 53*  --   --   --   --   ALT 21  --   --   --   --   ALKPHOS 102  --   --   --   --   BILITOT 1.3*  --   --   --   --   GFRNONAA 4*   < > 15* 15* 11*  GFRAA 5*   < > 18* 17* 13*  ANIONGAP 23*   < > _0 < > = values in this interval not displayed.     Hematology Recent Labs  Lab 12/23/18 0524 12/24/18 0540 12/25/18 0500  WBC 18.7* 22.9* 22.9*  RBC 2.82* 2.44* 2.17*  HGB 9.0* 7.7* 6.9*  HCT 25.8* 22.6* 19.9*  MCV 91.5 92.6 91.7  MCH 31.9 31.6 31.8  MCHC 34.9 34.1 34.7  RDW 14.6 14.8 14.9  PLT 67* 83* 121*    Cardiac EnzymesNo results for input(s): TROPONINI in the last 168 hours. No results for input(s): TROPIPOC in the last 168 hours.   BNPNo results for input(s): BNP, PROBNP in the last 168 hours.   DDimer No results  for input(s): DDIMER in the last 168 hours.   Radiology    Dg Chest Port 1 View  Result Date: 12/25/2018 CLINICAL DATA:  58 year old male with cerebral infarcts, sepsis, bacteremia. EXAM: PORTABLE CHEST 1 VIEW COMPARISON:  12/24/2018 and earlier. FINDINGS: Portable AP semi upright view at 0644 hours. Stable endotracheal tube and right IJ central line. Enteric tube tip at the level of the gastric body, the side hole is not clearly identified. Improved ventilation since yesterday with decreasing indistinct perihilar opacity. Mildly larger lung volumes. Stable cardiac size and mediastinal contours. No pneumothorax, pleural effusion or areas of worsening ventilation. Stable visible bowel gas pattern. IMPRESSION: 1. Stable lines and tubes. 2. Improved ventilation since yesterday with decreasing indistinct perihilar opacity. 3. No new cardiopulmonary abnormality. Electronically Signed   By: Genevie Ann M.D.   On: 12/25/2018 09:21   Dg Chest Port 1 View  Result Date: 12/24/2018 CLINICAL DATA:  Acute respiratory failure. EXAM: PORTABLE CHEST 1 VIEW COMPARISON:  Yesterday. FINDINGS: Endotracheal tube, enteric tube, and right central line remain in place. Persistent low lung volumes. Ill-defined bilateral lung opacities, left greater than right, with slight progression from prior. Worsening retrocardiac and right basilar opacity with more confluent density. No large pleural effusion. No pneumothorax. IMPRESSION: Worsening bilateral perihilar lung opacities, left greater than right. This may be pulmonary edema, ARDS, or multifocal pneumonia. More confluent opacities at the bases is also progressed. Electronically Signed   By: Keith Rake M.D.   On: 12/24/2018 03:58    Cardiac Studies   TTE: 12/24/2018  IMPRESSIONS   1. The left ventricle has hyperdynamic systolic function, with an ejection fraction of >65%. The cavity size was normal. There is severe concentric left ventricular hypertrophy. Left  ventricular diastolic Doppler parameters are consistent with impaired  relaxation. Elevated left atrial and left ventricular end-diastolic pressures. 2. The right ventricle has normal systolic function. The cavity was normal. There is no increase in right ventricular wall thickness. 3. Left atrial size was moderately dilated. 4. Right atrial size was mildly dilated. 5. The mitral valve is degenerative. Severe thickening of the mitral valve leaflet. Severe calcification of the mitral valve leaflet. There is severe mitral annular calcification present. Mitral valve regurgitation is severe by color flow Doppler.  6. The aortic valve is tricuspid. Severely thickening of the aortic valve. Severe calcifcation of the aortic valve. Aortic valve regurgitation was not assessed by color flow Doppler.  SUMMARY  Severe concentric LVH, hyperdynamic LVEF. Aortic valve is severely thickened and calcified. There are severe posterior mitral annular calcifications extending into the mitral lefalets with a mobile portion that most probably represents a large calcified mass partially detached from the leaflet. This can possibly be a source of embolism and stroke. Evaluation for TTR amyloidosis with PYP nuclear scan should be considered.  Patient Profile     58 y.o. 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 Leonie Man  Assessment & Plan    1. Afib RVR: has alternated with sinus tach -likely driven by acute illness, intermittent need for pressors -continue amiodarone -no anticoagulation acutely given risk of hemorrhagic conversion of embolic CVA -[id number] Stroke Risk Points=4 . Consider anticoagulation in the long term once clear from a neurologic standpoint, pending neuro recovery and long term prognosis -was on levophed on my exam. BP will not tolerate beta blocker at this point, but when more stable would add back   2. Sepsis with MSSA bacteremia: BC + on admission. WBC 20>>15.4. Temp 103 while in the ED.  -- antibiotics per ID, endocarditis as below  3. Mitral valve endocarditis: please see my note/images from 5/13. Meets Modified [facility] Criteria for endocarditis. Would not pursue TEE unless he is a surgical candidate.  [facility] will sign off.   Medication Recommendations:  As ordered, add back beta blocker when blood pressure allows Other recommendations (labs, testing, etc):  none Follow up as an outpatient:  We will arrange follow up with Dr. Agustin Cree  TIME SPENT WITH PATIENT: 15 minutes of direct patient care. More than 50% of that time was spent on coordination of care and study review regarding rhythm management and endocarditis.  Buford Dresser, MD, PhD [facility]   For questions or updates, please contact [place] Please consult [email or web address] for contact info under     Signed, Buford Dresser, MD  12/25/2018, 4:28 PM

## 2018-12-25 NOTE — Progress Notes (Signed)
   Cardiology team is signing off on patient today. I arranged follow-up with Dr. Agustin Cree on 01/25/2019 at 2:40pm. Given current COVI9-19 pandemic, this was scheduled as a virtual visit. Patient is currently intubated and sedated so I was unable to call and inform patient of appointment; however, follow-up and all telehealth visit information was placed in discharge paper work.   Darreld Mclean, PA-C 12/25/2018 4:51 PM

## 2018-12-25 NOTE — Discharge Instructions (Signed)
YOUR CARDIOLOGY TEAM HAS ARRANGED FOR AN E-VISIT FOR YOUR APPOINTMENT - PLEASE REVIEW IMPORTANT INFORMATION BELOW SEVERAL DAYS PRIOR TO YOUR APPOINTMENT  Due to the recent COVID-19 pandemic, we are transitioning in-person office visits to tele-medicine visits in an effort to decrease unnecessary exposure to our patients, their families, and staff. These visits are billed to your insurance just like a normal visit is. We also encourage you to sign up for MyChart if you have not already done so. You will need a smartphone if possible. For patients that do not have this, we can still complete the visit using a regular telephone but do prefer a smartphone to enable video when possible. You may have a family member that lives with you that can help. If possible, we also ask that you have a blood pressure cuff and scale at home to measure your blood pressure, heart rate and weight prior to your scheduled appointment. Patients with clinical needs that need an in-person evaluation and testing will still be able to come to the office if absolutely necessary. If you have any questions, feel free to call our office.   YOUR PROVIDER WILL BE USING ONE OF THE FOLLOWING PLATFORM TO COMPLETE YOUR VISIT:  MyChart/[email or web address] (our office will contact you a couple of days prior to your visit with more information)   IF USING MYCHART - How to Download the MyChart App to Your SmartPhone   - If Apple, go to [employer] and type in MyChart in the search bar and download the app. If Android, ask patient to go to [employer] and type in [place] in the search bar and download the app. The app is free but as with any other app downloads, your phone may require you to verify saved payment information or Apple/Android password.  - You will need to then log into the app with your MyChart username and password, and select [facility] as your healthcare provider to link the account.  - When it is time for your visit, go to the  MyChart app, find appointments, and click Begin Video Visit. Be sure to Select Allow for your device to access the Microphone and Camera for your visit. You will then be connected, and your provider will be with you shortly.  **If you have any issues connecting or need assistance, please contact MyChart service desk [phone])**  **If using a computer, in order to ensure the best quality for your visit, you will need to use either of the following Internet Browsers: Insurance underwriter or Microsoft Edge**   IF USING DOXIMITY or [email or web address] - The staff will give you instructions on receiving your link to join the meeting the day of your visit.      2-3 DAYS BEFORE YOUR APPOINTMENT  You will receive a telephone call from one of our [facility] team members - your caller ID may say "Unknown caller." If this is a video visit, we will walk you through how to get the video launched on your phone. We will remind you check your blood pressure, heart rate and weight prior to your scheduled appointment. If you have an Apple Watch or Kardia, please upload any pertinent ECG strips the day before or morning of your appointment to [facility]. Our staff will also make sure you have reviewed the consent and agree to move forward with your scheduled tele-health visit.   THE DAY OF YOUR APPOINTMENT  Approximately 15 minutes prior to your scheduled appointment, you will receive a telephone  call from one of [facility] team - your caller ID may say "Unknown caller."  Our staff will confirm medications, vital signs for the day and any symptoms you may be experiencing. Please have this information available prior to the time of visit start. It may also be helpful for you to have a pad of paper and pen handy for any instructions given during your visit. They will also walk you through joining the smartphone meeting if this is a video visit.  CONSENT FOR TELE-HEALTH VISIT - PLEASE REVIEW  I hereby voluntarily request,  consent and authorize Center and its employed or contracted physicians, physician assistants, nurse practitioners or other licensed health care professionals (the Practitioner), to provide me with telemedicine health care services (the Services") as deemed necessary by the treating Practitioner. I acknowledge and consent to receive the Services by the Practitioner via telemedicine. I understand that the telemedicine visit will involve communicating with the Practitioner through live audiovisual communication technology and the disclosure of certain medical information by electronic transmission. I acknowledge that I have been given the opportunity to request an in-person assessment or other available alternative prior to the telemedicine visit and am voluntarily participating in the telemedicine visit.  I understand that I have the right to withhold or withdraw my consent to the use of telemedicine in the course of my care at any time, without affecting my right to future care or treatment, and that the Practitioner or I may terminate the telemedicine visit at any time. I understand that I have the right to inspect all information obtained and/or recorded in the course of the telemedicine visit and may receive copies of available information for a reasonable fee.  I understand that some of the potential risks of receiving the Services via telemedicine include:   Delay or interruption in medical evaluation due to technological equipment failure or disruption;  Information transmitted may not be sufficient (e.g. poor resolution of images) to allow for appropriate medical decision making by the Practitioner; and/or   In rare instances, security protocols could fail, causing a breach of personal health information.  Furthermore, I acknowledge that it is my responsibility to provide information about my medical history, conditions and care that is complete and accurate to the best of my ability. I  acknowledge that Practitioner's advice, recommendations, and/or decision may be based on factors not within their control, such as incomplete or inaccurate data provided by me or distortions of diagnostic images or specimens that may result from electronic transmissions. I understand that the practice of medicine is not an exact science and that Practitioner makes no warranties or guarantees regarding treatment outcomes. I acknowledge that I will receive a copy of this consent concurrently upon execution via email to the email address I last provided but may also request a printed copy by calling the office of [facility].    I understand that my insurance will be billed for this visit.   I have read or had this consent read to me.  I understand the contents of this consent, which adequately explains the benefits and risks of the Services being provided via telemedicine.   I have been provided ample opportunity to ask questions regarding this consent and the Services and have had my questions answered to my satisfaction.  I give my informed consent for the services to be provided through the use of telemedicine in my medical care  By participating in this telemedicine visit I agree to the above.

## 2018-12-25 NOTE — Progress Notes (Signed)
Subjective:  Stopped CRRT but then immediately became unstable again-  some issues with hypotension and afib overnight - also seems agitated - now hypothermic   Objective Vital signs in last 24 hours: Vitals:   12/25/18 0715 12/25/18 0755 12/25/18 0800 12/25/18 0900  BP:   128/78 119/74  Pulse: (!) 101  (!) 106 (!) 128  Resp: (!) 22  (!) 21 (!) 24  Temp: (!) 97 F (36.1 C) 98.4 F (36.9 C) (!) 97.5 F (36.4 C) 98.1 F (36.7 C)  TempSrc:  Axillary    SpO2: 100% 100% 95% 90%  Weight:      Height:       Weight change:   Intake/Output Summary (Last 24 hours) at 12/25/2018 0928 Last data filed at 12/25/2018 0700 Gross per 24 hour  Intake 2468.34 ml  Output 535 ml  Net 1933.34 ml   HD orders- [facility]- 4 hours 450 BFR- left AVF - EDW 88 2/2 heparin - yes  parsabiv 5, calcitriol 1.5 Last hgb 10.9- calc 9.0- phos 6.9- pth 594   Assessment/ Plan: Pt is a 58 y.o. yo male who was admitted on 12/29/2018 with an acute ischemic stroke , then found to have an MSSA bacteremia   Assessment/Plan: 1. Acute CVA- supportive care- concern for septic emboli- TEE pending ? 2. MSSA bacteremia- on nafcillin - ID following, WBC still quite high - TEE pending?  AVF not felt to be source of infection 3. ESRD-  Normally TTS  - CRRT from 5/12 to 5/14- now more unstable again- no absolute need for RRT right now, likely will need resumption of something tomorrow- CRRT vs IHD- will plan for IHD and hope we do not need to do CRRT again  4. Secondary hyperparathyroidism- calcium is OK - Phos is OK - no binder  5. HTN/volume- volume and BP seem OK- no BP meds-  6. Anemia- hgb trending down - also platelets down- stopped heparin thru CRRT - gave Aranesp  7. VDRF-  Possibly approaching extubation ?   Louis Meckel    Labs: Basic Metabolic Panel: Recent Labs  Lab 12/24/18 0540 12/24/18 1730 12/25/18 0500  NA 136 137 136  K 4.3 4.6 4.4  CL 101 101 102  CO2 '24 24 22  ' GLUCOSE 137* 123* 142*   BUN 43* 52* 83*  CREATININE 4.11* 4.23* 5.40*  CALCIUM 8.6* 8.7* 8.8*  PHOS 2.6  2.6 3.5  3.5 4.1   Liver Function Tests: Recent Labs  Lab 12/24/2018 1139  12/24/18 0540 12/24/18 1730 12/25/18 0500  AST 53*  --   --   --   --   ALT 21  --   --   --   --   ALKPHOS 102  --   --   --   --   BILITOT 1.3*  --   --   --   --   PROT 6.8  --   --   --   --   ALBUMIN 3.3*   < > 2.0* 1.7* 1.7*   < > = values in this interval not displayed.   Recent Labs  Lab 12/22/18 0945  LIPASE 30   No results for input(s): AMMONIA in the last 168 hours. CBC: Recent Labs  Lab 12/26/2018 1139 12/22/18 0537 12/23/18 0524 12/24/18 0540 12/25/18 0500  WBC 20.2* 15.4* 18.7* 22.9* 22.9*  NEUTROABS 17.7*  --   --   --   --   HGB 11.0* 8.8* 9.0* 7.7* 6.9*  HCT 31.6* 25.7* 25.8* 22.6* 19.9*  MCV 92.1 93.1 91.5 92.6 91.7  PLT 75* 54* 67* 83* 121*   Cardiac Enzymes: No results for input(s): CKTOTAL, CKMB, CKMBINDEX, TROPONINI in the last 168 hours. CBG: Recent Labs  Lab 12/24/18 1527 12/24/18 1928 12/24/18 2310 12/25/18 0331 12/25/18 0747  GLUCAP 114* 116* 133* 123* 143*    Iron Studies: No results for input(s): IRON, TIBC, TRANSFERRIN, FERRITIN in the last 72 hours. Studies/Results: Dg Chest Port 1 View  Result Date: 12/25/2018 CLINICAL DATA:  58 year old male with cerebral infarcts, sepsis, bacteremia. EXAM: PORTABLE CHEST 1 VIEW COMPARISON:  12/24/2018 and earlier. FINDINGS: Portable AP semi upright view at 0644 hours. Stable endotracheal tube and right IJ central line. Enteric tube tip at the level of the gastric body, the side hole is not clearly identified. Improved ventilation since yesterday with decreasing indistinct perihilar opacity. Mildly larger lung volumes. Stable cardiac size and mediastinal contours. No pneumothorax, pleural effusion or areas of worsening ventilation. Stable visible bowel gas pattern. IMPRESSION: 1. Stable lines and tubes. 2. Improved ventilation since  yesterday with decreasing indistinct perihilar opacity. 3. No new cardiopulmonary abnormality. Electronically Signed   By: Genevie Ann M.D.   On: 12/25/2018 09:21   Dg Chest Port 1 View  Result Date: 12/24/2018 CLINICAL DATA:  Acute respiratory failure. EXAM: PORTABLE CHEST 1 VIEW COMPARISON:  Yesterday. FINDINGS: Endotracheal tube, enteric tube, and right central line remain in place. Persistent low lung volumes. Ill-defined bilateral lung opacities, left greater than right, with slight progression from prior. Worsening retrocardiac and right basilar opacity with more confluent density. No large pleural effusion. No pneumothorax. IMPRESSION: Worsening bilateral perihilar lung opacities, left greater than right. This may be pulmonary edema, ARDS, or multifocal pneumonia. More confluent opacities at the bases is also progressed. Electronically Signed   By: Keith Rake M.D.   On: 12/24/2018 03:58   Medications: Infusions: .  prismasol BGK 4/2.5 300 mL/hr at 12/24/18 1306  .  prismasol BGK 4/2.5 300 mL/hr at 12/24/18 1342  . sodium chloride Stopped (12/25/18 0610)  . dexmedetomidine (PRECEDEX) IV infusion 0.4 mcg/kg/hr (12/25/18 0739)  . feeding supplement (VITAL AF 1.2 CAL) 1,000 mL (12/25/18 0438)  . fentaNYL infusion INTRAVENOUS 50 mcg/hr (12/21/18 0452)  . nafcillin IV 2 g (12/25/18 0750)  . norepinephrine (LEVOPHED) Adult infusion 4 mcg/min (12/25/18 0700)    Scheduled Medications: . sodium chloride   Intravenous Once  . amiodarone  200 mg Per Tube BID  . aspirin  81 mg Per Tube Daily  . chlorhexidine gluconate (MEDLINE KIT)  15 mL Mouth Rinse BID  . Chlorhexidine Gluconate Cloth  6 each Topical Daily  . darbepoetin (ARANESP) injection - DIALYSIS  150 mcg Intravenous Q Thu-HD  . feeding supplement (PRO-STAT SUGAR FREE 64)  60 mL Per Tube QID  . mouth rinse  15 mL Mouth Rinse 10 times per day  . pantoprazole sodium  40 mg Per Tube Q24H  . sodium chloride flush  10-40 mL Intracatheter  Q12H  . sodium chloride flush  3 mL Intravenous Once    have reviewed scheduled and prn medications.  Physical Exam: General: agitated-  Heart: tachy Lungs: mostly clear Abdomen: soft, non tender Extremities: mild pitting edema  Dialysis Access: right AVF patent- also with right IJ temp HD cath for CRRT  - placed 5/12- could be removed    12/25/2018,9:28 AM  LOS: 5 days

## 2018-12-25 NOTE — Progress Notes (Signed)
NAME:  Thomas Adams, MRN:  [id number], DOB:  Dec 30, 1960, LOS: 5 ADMISSION DATE:  12/28/2018, CONSULTATION DATE:  12/25/18 REFERRING MD:  Dr. Germaine Pomfret , CHIEF COMPLAINT:  Left side weakness   Brief History   58 yr old with history of HTN, afib, febrile and coughing x 1 d, found with left facial droop, left sided weakness 5/10. 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 Brought to ICU intubated  Past Medical History  HTN ESRD, dialysis TThSat Afib w/ RVR  Significant Hospital Events   5/10 Angiogram in IR >> no clot retraction due to distal location of thrombus; hypotension from meds 5/11 d/c diprivan due to triglyceride levels 5/12 start CRRT; start amiodarone  Consults:  Nephrology  ID Cardiology  Procedures:  ETT 5/10 >>  Rt radial aline 5/10 >>  Rt IJ HD cath 5/11 >>   Significant Diagnostic Tests:  CT angio head/neck 5/10 >> Rt M2 occlusion, severe Lt MCA stenosis Echo 5/10 >> EF greater than 65%, severe LCH, severe MR MRI brain 5/11 >> multifocal acute ischemia scattered in both cerebral and cerebellar hemispheres CXR 5/13> L lung opacity  Micro Data:  COVID 5/10 >> negative Blood 5/10 >> MSSA  Antimicrobials:  Cefepime 5/10 >> 5/11 Vancomycin 5/10 >> 5/11 Ancef 5/11 >>  Subjective:   Remains critically ill, intubated Off pressors Remains on CRRT Mild agitation but weaning well on pressure support  Objective   Blood pressure (!) 91/55, pulse (!) 101, temperature 98.4 F (36.9 C), temperature source Axillary, resp. rate (!) 22, height 5\' 9"  (1.753 m), weight 96.1 kg, SpO2 100 %. CVP:  [13 mmHg] 13 mmHg  Vent Mode: CPAP;PSV FiO2 (%):  [40 %-60 %] 40 % Set Rate:  [22 bmp] 22 bmp Vt Set:  [560 mL] 560 mL PEEP:  [5 cmH20-8 cmH20] 5 cmH20 Pressure Support:  [5 cmH20-10 cmH20] 5 cmH20 Plateau Pressure:  [23 cmH20] 23 cmH20   Intake/Output Summary (Last 24 hours) at 12/25/2018 0817 Last data filed  at 12/25/2018 0700 Gross per 24 hour  Intake 2694.68 ml  Output 627 ml  Net 2067.68 ml   Filed Weights   01/07/2019 1138 12/21/2018 1500 12/25/18 0440  Weight: 87.2 kg 85.3 kg 96.1 kg    Examination: General - adult male, intubated, NAD  HEENT: NCAT, ETT secure, trachea midline  Cardiac - IRIR, s1s2, no JVD  Chest -decreased breath sounds bilateral Abdomen - Soft, round, ndnt, bowel sounds x4  Extremities - No obvious deformity. Bilateral pedal edema. No clubbing no cyanosis  Skin - clean dry warm no rash  Neuro -mild agitation, RA SS +1, follows commands, power 3-4/5 on right, hemiplegic on left   Chest x-ray 5/14 personally reviewed which shows worsening bilateral infiltrates  Resolved Hospital Problem list     Assessment & Plan:   Acute respiratory failure due to compromised airway in setting of CVA Lung infiltrate could be edema versus septic emboli Possible component of pulm edema  Plan - Weaning vent well, sufficient volumes.  - Mental status remains barrier to extubation. On precedex for sedation, RASS goal 0 to -1  - Fluid removal per nephrology as below  - AM CXR   Septic shock, MSSA bacteremia in setting of new severe MR. Plan - Currently on NE however MAPs > 65 - Wean NE as able, MAP goal > 65  - Continue ancef per ID  - Continue to trend WBC, temperature   CVA  Thrombectomy by IR unsuccessful Plan - per neurology -Aspirin, no anticoagulation for now  ESRD. Non gap metabolic acidosis. Hyponatremia improved  Plan -nephrology following - Transitioning from CRRT to iHD, likely iHD today v tomorrow   Elevated triglycerides -propofol since discontinued  Plan -Continue precedex gtt  - RASS goal 0 to -1   Anemia of critical illness and chronic disease. Thrombocytopenia in setting of sepsis. Plan - trend CBC  - transfuse for Hb < 7, PLT < 10K - Order 1 PRBC today for Hgb 6.9  - check H/H post transfusion   Infective endocarditis Severe LVH  Aortic valve calcification Mitral regurg , new, eccentric Atrial Fibrillation with RVR, converted to sinus tach  - cardiology following, appreciate recs   -will need 6 weeks of antibiotics -Will need TEE only if surgery planned -Continue amio - Consider low dose metop when pressure stable    Best practice:  Diet: NPO DVT prophylaxis: SCDs GI prophylaxis: protonix Mobility: bed rest Code Status: full Disposition: ICU   Critical Care Time : 40 minutes   Eliseo Gum MSN, AGACNP-BC [facility] [phone] If no answer, [phone] 12/25/2018, 8:17 AM

## 2018-12-25 NOTE — Progress Notes (Signed)
NAME:  Thomas Adams, MRN:  [id number], DOB:  1961-06-14, LOS: 5 ADMISSION DATE:  01/10/2019, CONSULTATION DATE:  12/25/18 REFERRING MD:  Dr. Germaine Pomfret , CHIEF COMPLAINT:  Left side weakness   Brief History   58 yr old with history of HTN, afib, febrile and coughing x 1 d, found with left facial droop, left sided weakness 5/10. 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 Brought to ICU intubated  Past Medical History  HTN ESRD, dialysis TThSat Afib w/ RVR  Significant Hospital Events   5/10 Angiogram in IR >> no clot retraction due to distal location of thrombus; hypotension from meds 5/11 d/c diprivan due to triglyceride levels 5/12 start CRRT; start amiodarone 5/14 Off pressors, on CRRT  Consults:  Nephrology  ID Cardiology  Procedures:  ETT 5/10 >>  Rt radial aline 5/10 >>  Rt IJ HD cath 5/11 >>   Significant Diagnostic Tests:  CT angio head/neck 5/10 >> Rt M2 occlusion, severe Lt MCA stenosis Echo 5/10 >> EF greater than 65%, severe LCH, severe MR MRI brain 5/11 >> multifocal acute ischemia scattered in both cerebral and cerebellar hemispheres  Micro Data:  COVID 5/10 >> negative Blood 5/10 >> MSSA  Antimicrobials:  Cefepime 5/10 >> 5/11 Vancomycin 5/10 >> 5/11 Ancef 5/11 >>5/13 Nafcillin 5/13 >  Subjective:   Remains critically ill, intubated On low-dose Levophed Developed hypotension with CRRT filter clotted A. fib RVR requiring bolus of amiodarone   Objective   Blood pressure (!) 91/55, pulse (!) 101, temperature 98.4 F (36.9 C), temperature source Axillary, resp. rate (!) 22, height 5\' 9"  (1.753 m), weight 96.1 kg, SpO2 100 %. CVP:  [13 mmHg] 13 mmHg  Vent Mode: CPAP;PSV FiO2 (%):  [40 %-60 %] 40 % Set Rate:  [22 bmp] 22 bmp Vt Set:  [560 mL] 560 mL PEEP:  [5 cmH20-8 cmH20] 5 cmH20 Pressure Support:  [5 cmH20] 5 cmH20 Plateau Pressure:  [23 cmH20] 23 cmH20   Intake/Output Summary (Last  24 hours) at 12/25/2018 0858 Last data filed at 12/25/2018 0700 Gross per 24 hour  Intake 2694.68 ml  Output 627 ml  Net 2067.68 ml   Filed Weights   12/25/2018 1138 01/01/2019 1500 12/25/18 0440  Weight: 87.2 kg 85.3 kg 96.1 kg    Examination: General - adult male, intubated, NAD  HEENT: NCAT, ETT secure, trachea midline  Cardiac - IRIR, s1s2, no JVD  Chest -decreased breath sounds bilateral Abdomen - Soft, round, ndnt, bowel sounds x4  Extremities - No obvious deformity. Bilateral pedal edema. No clubbing no cyanosis  Skin - clean dry warm no rash  Neuro -agitated, RA SS +1, follows commands, power 4/5 on right, hemiplegic on left   Chest x-ray 5/15 personally reviewed which shows improved bilateral interstitial edema pattern  Resolved Hospital Problem list     Assessment & Plan:   Acute respiratory failure due to compromised airway in setting of CVA Acute pulmonary edema Plan -Weans well but hypoxic tachycardic, continue spontaneous breathing trials but will hold off extubation   Septic shock, 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. Plan -Taper Levophed as tolerated - Continue nafcillin per ID  - Continue to trend WBC   CVA. Thrombectomy by IR unsuccessful Plan - per neurology -Aspirin, will need anticoagulation at some point  ESRD. Hyponatremia. Non gap metabolic acidosis. Plan -nephrology following -Transition to intermittent HD if BP permits  Agitation Elevated triglycerides on propofol  Plan -ct Precedex ,  goal RA SS 0 to -1   Anemia of critical illness and chronic disease. Thrombocytopenia -improving Plan - 1 U PRBC today - transfuse for Hb < 7  Infective endocarditis Severe LVH Aortic valve calcification Mitral regurg , new, eccentric Atrial Fibrillation , not on anticoagulation - will need 6 weeks of antibiotics -Will need TEE only if surgery planned -amio gtt   Best practice:  Diet: NPO DVT prophylaxis: SCDs GI prophylaxis: protonix  Mobility: bed rest Code Status: full Disposition: ICU  Summary-getting closer to extubation , will see how he handles transfusion today , also expect pulmonary edema to get worse without dialysis, he has eccentric new MR due to endocarditis and may yet need mitral valve surgery although he would be very high risk  The patient is critically ill with multiple organ systems failure and requires high complexity decision making for assessment and support, frequent evaluation and titration of therapies, application of advanced monitoring technologies and extensive interpretation of multiple databases. Critical Care Time devoted to patient care services described in this note independent of APP/resident  time is 32 minutes.     Kara Mead MD. [facility] Flood. [facility] Pager [phone] If no response call [phone]    12/25/2018, 8:58 AM

## 2018-12-25 NOTE — Progress Notes (Signed)
STROKE TEAM PROGRESS NOTE   INTERVAL HISTORY Patient remains intubated and on mild sedation.  He remains hemodynamically unstable and was back on pressors last night.  He tolerated ventilator weaning during the day yesterday but was placed back on the ventilator.  His hemoglobin has drifted down and he will get blood transfusion today and attempt at extubation will be postponed till tomorrow.  Renal function continues to improve on dialysis.  Vitals:   12/25/18 0715 12/25/18 0755 12/25/18 0800 12/25/18 0900  BP:   128/78 119/74  Pulse: (!) 101  (!) 106 (!) 128  Resp: (!) 22  (!) 21 (!) 24  Temp: (!) 97 F (36.1 C) 98.4 F (36.9 C) (!) 97.5 F (36.4 C) 98.1 F (36.7 C)  TempSrc:  Axillary    SpO2: 100% 100% 95% 90%  Weight:      Height:        CBC:  Recent Labs  Lab 12/12/2018 1139  12/24/18 0540 12/25/18 0500  WBC 20.2*   < > 22.9* 22.9*  NEUTROABS 17.7*  --   --   --   HGB 11.0*   < > 7.7* 6.9*  HCT 31.6*   < > 22.6* 19.9*  MCV 92.1   < > 92.6 91.7  PLT 75*   < > 83* 121*   < > = values in this interval not displayed.    Basic Metabolic Panel:  Recent Labs  Lab 12/24/18 1730 12/25/18 0500  NA 137 136  K 4.6 4.4  CL 101 102  CO2 24 22  GLUCOSE 123* 142*  BUN 52* 83*  CREATININE 4.23* 5.40*  CALCIUM 8.7* 8.8*  MG 2.5* 2.6*  PHOS 3.5  3.5 4.1    PHYSICAL EXAM:    Middle-age male who is   intubated and on ventilator.  . . Afebrile. Head is nontraumatic. Neck is supple without bruit.    Cardiac exam  systolic murmur . no gallop. Lungs are clear to auscultation. Distal pulses are well felt. Neurological Exam :  Patient is drowsy intubated but can be aroused with sternal rub.Marland Kitchen  He follows a few simple commands on right side intermittently    He has right gaze preference    He blinks to threat more on the right than the left.  Pupils equal reactive.  Fundi not visualized.  Mild left lower facial weakness.  Left hemiplegia with 0/5 left upper extremity strength.  1/5  left lower extremity strength.  He withdraws left lower extremity slightly to pain but not left upper extremity.   ASSESSMENT/PLAN [name] is a 58 y.o. male with history of atrial fibrillation not on AC, HTN and ESRD on HD presenting with left hemiparesis and left facial droop with dysarthria. Sent to IR but no correctable lesion.  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 Small vessel disease. ASPECTS 10.     CTA head & neck suspected mid R M2 branch occlusion w/ 57mL penumbra R parietal. No LVO. Possible severe L MCA origin stenosis. Aortic atherosclerosis   CT perfusion no core  Cerebral angio RT MCA   Sup division M3 branch tapered occlusion. No intervention  MRI  Multifocal B supratentorial infarcts. No shift or mass effect. General atrophy. Mult scattered foci of microhemorrhages at sites of infarct. Abnormal flow void R transverse sinus (sinus clear on previous CTA).  2D Echo EF .  65%. severe LVH. LA and RA dilated. Severe AV thickening. No source of embolus seen.  HIV negative   TEE not indicated per cardiology as meeting dx criteria for endocarditis  LDL UTC d/t high TG 691  HgbA1c 5.5  SCDs for VTE prophylaxis. Hold Heparin given low PLTS   No antithrombotic prior to admission, now on aspirin 81 mg daily. Not an annticoagulation candidate due to endocarditis, risk of bleeding and low Hgb at this time. Consider switch to [facility] after completing 6 wk abx course. For now, continue aspirin 81 in setting of low Hgb per Dr. Leonie Adams  Therapy recommendations:  pending   Disposition:  pending   Acute Hypoxic Respiratory Failure, compromised airway  Intubated initially for IR  CXR decreasing perihilar opacity.  Weaning but hypoxic tachycardic. Holding extubation  PCCM on board  Sepsis w/ MSSA bacteremia in setting of new severe MR with mobile mass,  likely endocarditis  WBC 18.7->22.9  Temp in ED 103, TMax past 24h 100.4  On nafcillin, plan treatment x 6 weeks  CXR advanced FT, o/w stable  TEE not necessary per cardiology unless needed for cardiac surgery   Repeat Blood cultures pending   UCx no growth  Lactic acid 3.3  ID on board  CVTS - if pt needs OR, Dr. Roxy Adams will do. He is currently out of town but has reviewed case. He will formally consult on Monday  Atrial Fibrillation w/ RVR . Home anticoagulation:  none  . Amiodarone infusion transitioned to via tube. Continue.   . HR 90-120s . Cardiology on board . Add low dose metoprolol once BP stable x 24h . Not an AC candidate d/t endocarditis and risk for bleeding - consider in future after abx treatment is completed in 6 weeks.  Continue aspirin for now. . INR 1.7 . Mg  2.2   Hypotension in setting of sepsis  Hypertensive Emergency on admission   BP as high as 174/139 on admission  Home meds:  Norvasc 10, hydralazine 25 tid, labetalol 100 bid, lisinopril 40  BP low normal 90-120s  Not on BP meds  On levophed, taper . BP goal normotensive  Hyperlipidemia  Home meds:  No statin  LDL UTC d/t high TG 691, goal < 70  Recheck lipids once off lipid based drips  Consider statin if LDL remains elevated at that time  Dysphagia  On vent  TF per CCM  Other Stroke Risk Factors  Overweight, Body mass index is 31.29 kg/m., recommend weight loss, diet and exercise as appropriate   Other Active Problems  ESRD on HD TTS. Nephrology on board. temporary dialysis catheter placed. For transition CRRT to HD Fri/Sat  Anemia of chronic illness, kidney disease 9.0-8.8-7.7-6.9-xfuse 1u PRBC  Secondary hyperparathyroidism   Thrombocytopenia in setting of sepsis 54-67-83-121. SQ heparin held. Also stopping heparin thru CRRT.  Hospital day # 5   Patient remains critically ill with multisystem full organ system involvement and appreciate help from pulmonary  critical care medicine, infectious disease, nephrology and cardiology services.  Continue aspirin for stroke prevention for now as he is too high risk for bleeding given his unstable situation.  Will consider anticoagulation after completion of antibiotic course for endocarditis in 6 weeks.  Hopefully extubate in the next few days.  Cardiothoracic surgery team willing to operate on him for mitral valve replacement but he has to show significant improvement in rehab over the next few months prior to cardiac surgery discussed with Dr. Elsworth Soho and cardiology teams. This patient  is critically ill and at significant risk of neurological worsening, death and care requires constant monitoring of vital signs, hemodynamics,respiratory and cardiac monitoring, extensive review of multiple databases, frequent neurological assessment, discussion with family, other specialists and medical decision making of high complexity.I have made any additions or clarifications directly to the above note.This critical care time does not reflect procedure time, or teaching time or supervisory time of PA/NP/Med Resident etc but could involve care discussion time.  I spent 30 minutes of neurocritical care time  in the care of  this patient.      Antony Contras, MD Medical Director [facility] Pager: [phone] 12/25/2018 9:10 AM    To contact Stroke Continuity provider, please refer to [email or web address] After hours, contact General Neurology

## 2018-12-26 ENCOUNTER — Inpatient Hospital Stay (HOSPITAL_COMMUNITY): Payer: Medicare Other

## 2018-12-26 DIAGNOSIS — R509 Fever, unspecified: Secondary | ICD-10-CM

## 2018-12-26 DIAGNOSIS — Z452 Encounter for adjustment and management of vascular access device: Secondary | ICD-10-CM

## 2018-12-26 LAB — POCT I-STAT 7, (LYTES, BLD GAS, ICA,H+H)
Acid-base deficit: 7 mmol/L — ABNORMAL HIGH (ref 0.0–2.0)
Acid-base deficit: 3 mmol/L — ABNORMAL HIGH (ref 0.0–2.0)
Bicarbonate: 21.2 mmol/L (ref 20.0–28.0)
Bicarbonate: 22.5 mmol/L (ref 20.0–28.0)
Calcium, Ion: 1.32 mmol/L (ref 1.15–1.40)
Calcium, Ion: 1.3 mmol/L (ref 1.15–1.40)
HCT: 27 % — ABNORMAL LOW (ref 39.0–52.0)
HCT: 24 % — ABNORMAL LOW (ref 39.0–52.0)
Hemoglobin: 9.2 g/dL — ABNORMAL LOW (ref 13.0–17.0)
Hemoglobin: 8.2 g/dL — ABNORMAL LOW (ref 13.0–17.0)
O2 Saturation: 66 %
O2 Saturation: 98 %
Patient temperature: 99
Patient temperature: 99
Potassium: 4.5 mmol/L (ref 3.5–5.1)
Potassium: 4.8 mmol/L (ref 3.5–5.1)
Sodium: 137 mmol/L (ref 135–145)
Sodium: 138 mmol/L (ref 135–145)
TCO2: 23 mmol/L (ref 22–32)
TCO2: 24 mmol/L (ref 22–32)
pCO2 arterial: 55.3 mmHg — ABNORMAL HIGH (ref 32.0–48.0)
pCO2 arterial: 39.4 mmHg (ref 32.0–48.0)
pH, Arterial: 7.193 — CL (ref 7.350–7.450)
pH, Arterial: 7.365 (ref 7.350–7.450)
pO2, Arterial: 43 mmHg — ABNORMAL LOW (ref 83.0–108.0)
pO2, Arterial: 116 mmHg — ABNORMAL HIGH (ref 83.0–108.0)

## 2018-12-26 LAB — RENAL FUNCTION PANEL
Albumin: 1.7 g/dL — ABNORMAL LOW (ref 3.5–5.0)
Albumin: 1.6 g/dL — ABNORMAL LOW (ref 3.5–5.0)
Anion gap: 18 — ABNORMAL HIGH (ref 5–15)
Anion gap: 14 (ref 5–15)
BUN: 131 mg/dL — ABNORMAL HIGH (ref 6–20)
BUN: 107 mg/dL — ABNORMAL HIGH (ref 6–20)
CO2: 20 mmol/L — ABNORMAL LOW (ref 22–32)
CO2: 21 mmol/L — ABNORMAL LOW (ref 22–32)
Calcium: 9.4 mg/dL (ref 8.9–10.3)
Calcium: 8.8 mg/dL — ABNORMAL LOW (ref 8.9–10.3)
Chloride: 100 mmol/L (ref 98–111)
Chloride: 103 mmol/L (ref 98–111)
Creatinine, Ser: 6.87 mg/dL — ABNORMAL HIGH (ref 0.61–1.24)
Creatinine, Ser: 5.35 mg/dL — ABNORMAL HIGH (ref 0.61–1.24)
GFR calc Af Amer: 10 mL/min — ABNORMAL LOW (ref >60)
GFR calc Af Amer: 13 mL/min — ABNORMAL LOW (ref >60)
GFR calc non Af Amer: 8 mL/min — ABNORMAL LOW (ref >60)
GFR calc non Af Amer: 11 mL/min — ABNORMAL LOW (ref >60)
Glucose, Bld: 177 mg/dL — ABNORMAL HIGH (ref 70–99)
Glucose, Bld: 167 mg/dL — ABNORMAL HIGH (ref 70–99)
Phosphorus: 5.9 mg/dL — ABNORMAL HIGH (ref 2.5–4.6)
Phosphorus: 3.9 mg/dL (ref 2.5–4.6)
Potassium: 4.6 mmol/L (ref 3.5–5.1)
Potassium: 4.5 mmol/L (ref 3.5–5.1)
Sodium: 138 mmol/L (ref 135–145)
Sodium: 138 mmol/L (ref 135–145)

## 2018-12-26 LAB — CBC
HCT: 26.7 % — ABNORMAL LOW (ref 39.0–52.0)
Hemoglobin: 8.9 g/dL — ABNORMAL LOW (ref 13.0–17.0)
MCH: 30.9 pg (ref 26.0–34.0)
MCHC: 33.3 g/dL (ref 30.0–36.0)
MCV: 92.7 fL (ref 80.0–100.0)
Platelets: 183 10*3/uL (ref 150–400)
RBC: 2.88 MIL/uL — ABNORMAL LOW (ref 4.22–5.81)
RDW: 17.4 % — ABNORMAL HIGH (ref 11.5–15.5)
WBC: 34.8 10*3/uL — ABNORMAL HIGH (ref 4.0–10.5)
nRBC: 0.1 % (ref 0.0–0.2)

## 2018-12-26 LAB — GLUCOSE, CAPILLARY
Glucose-Capillary: 100 mg/dL — ABNORMAL HIGH (ref 70–99)
Glucose-Capillary: 102 mg/dL — ABNORMAL HIGH (ref 70–99)
Glucose-Capillary: 114 mg/dL — ABNORMAL HIGH (ref 70–99)
Glucose-Capillary: 124 mg/dL — ABNORMAL HIGH (ref 70–99)
Glucose-Capillary: 126 mg/dL — ABNORMAL HIGH (ref 70–99)
Glucose-Capillary: 145 mg/dL — ABNORMAL HIGH (ref 70–99)

## 2018-12-26 LAB — MAGNESIUM: Magnesium: 2.9 mg/dL — ABNORMAL HIGH (ref 1.7–2.4)

## 2018-12-26 LAB — PROTIME-INR
INR: 1.5 — ABNORMAL HIGH (ref 0.8–1.2)
Prothrombin Time: 18 seconds — ABNORMAL HIGH (ref 11.4–15.2)

## 2018-12-26 MED ORDER — VECURONIUM BROMIDE 10 MG IV SOLR
10.0000 mg | Freq: Once | INTRAVENOUS | Status: AC
  Administered 2018-12-26: 06:00:00 10 mg via INTRAVENOUS

## 2018-12-26 MED ORDER — HEPARIN SODIUM (PORCINE) 1000 UNIT/ML DIALYSIS
1000.0000 [IU] | INTRAMUSCULAR | Status: DC | PRN
  Filled 2018-12-26: qty 6

## 2018-12-26 MED ORDER — SODIUM CHLORIDE 0.9 % IV SOLN
2.0000 g | INTRAVENOUS | Status: DC
  Administered 2018-12-26 – 2018-12-31 (×27): 2 g via INTRAVENOUS
  Filled 2018-12-26 (×33): qty 2000

## 2018-12-26 MED ORDER — SODIUM CHLORIDE 0.9 % FOR CRRT
INTRAVENOUS_CENTRAL | Status: DC | PRN
  Filled 2018-12-26: qty 1000

## 2018-12-26 MED ORDER — PRISMASOL BGK 4/2.5 32-4-2.5 MEQ/L REPLACEMENT SOLN
Status: DC
  Administered 2018-12-26 – 2018-12-30 (×7): via INTRAVENOUS_CENTRAL
  Filled 2018-12-26 (×9): qty 5000

## 2018-12-26 MED ORDER — VECURONIUM BROMIDE 10 MG IV SOLR
INTRAVENOUS | Status: AC
  Filled 2018-12-26: qty 10

## 2018-12-26 MED ORDER — PRISMASOL BGK 4/2.5 32-4-2.5 MEQ/L IV SOLN
INTRAVENOUS | Status: DC
  Administered 2018-12-26 – 2018-12-31 (×40): via INTRAVENOUS_CENTRAL
  Filled 2018-12-26 (×2): qty 5000
  Filled 2018-12-26: qty 40000
  Filled 2018-12-26 (×12): qty 5000
  Filled 2018-12-26: qty 40000
  Filled 2018-12-26 (×6): qty 5000
  Filled 2018-12-26 (×2): qty 40000
  Filled 2018-12-26 (×2): qty 5000
  Filled 2018-12-26: qty 40000
  Filled 2018-12-26 (×3): qty 5000
  Filled 2018-12-26: qty 40000
  Filled 2018-12-26: qty 5000
  Filled 2018-12-26: qty 40000
  Filled 2018-12-26 (×9): qty 5000
  Filled 2018-12-26: qty 40000
  Filled 2018-12-26 (×23): qty 5000

## 2018-12-26 MED ORDER — HEPARIN (PORCINE) 2000 UNITS/L FOR CRRT
INTRAVENOUS_CENTRAL | Status: DC | PRN
  Filled 2018-12-26 (×2): qty 1000

## 2018-12-26 MED ORDER — PRISMASOL BGK 4/2.5 32-4-2.5 MEQ/L REPLACEMENT SOLN
Status: DC
  Administered 2018-12-26 – 2018-12-30 (×7): via INTRAVENOUS_CENTRAL
  Filled 2018-12-26 (×3): qty 40000
  Filled 2018-12-26 (×5): qty 5000
  Filled 2018-12-26 (×3): qty 40000
  Filled 2018-12-26: qty 5000
  Filled 2018-12-26: qty 40000
  Filled 2018-12-26: qty 5000
  Filled 2018-12-26: qty 40000
  Filled 2018-12-26 (×3): qty 5000

## 2018-12-26 MED ORDER — DEXMEDETOMIDINE HCL IN NACL 400 MCG/100ML IV SOLN
0.4000 ug/kg/h | INTRAVENOUS | Status: DC
  Administered 2018-12-26: 17:00:00 1 ug/kg/h via INTRAVENOUS
  Administered 2018-12-26 (×2): 1.4 ug/kg/h via INTRAVENOUS
  Administered 2018-12-26: 21:00:00 1.5 ug/kg/h via INTRAVENOUS
  Administered 2018-12-27: 1.4 ug/kg/h via INTRAVENOUS
  Administered 2018-12-27: 1.7 ug/kg/h via INTRAVENOUS
  Administered 2018-12-27 (×2): 1.5 ug/kg/h via INTRAVENOUS
  Administered 2018-12-27 (×2): 1.7 ug/kg/h via INTRAVENOUS
  Administered 2018-12-27: 1.5 ug/kg/h via INTRAVENOUS
  Administered 2018-12-27: 1.2 ug/kg/h via INTRAVENOUS
  Administered 2018-12-27: 1.7 ug/kg/h via INTRAVENOUS
  Administered 2018-12-27: 0.7 ug/kg/h via INTRAVENOUS
  Administered 2018-12-28: 1.2 ug/kg/h via INTRAVENOUS
  Administered 2018-12-28: 1.5 ug/kg/h via INTRAVENOUS
  Administered 2018-12-28 (×2): 1.8 ug/kg/h via INTRAVENOUS
  Administered 2018-12-28: 1.2 ug/kg/h via INTRAVENOUS
  Administered 2018-12-28: 1.8 ug/kg/h via INTRAVENOUS
  Administered 2018-12-28: 0.832 ug/kg/h via INTRAVENOUS
  Administered 2018-12-28: 1.8 ug/kg/h via INTRAVENOUS
  Administered 2018-12-29: 1.4 ug/kg/h via INTRAVENOUS
  Administered 2018-12-29: 1.5 ug/kg/h via INTRAVENOUS
  Administered 2018-12-29: 1.8 ug/kg/h via INTRAVENOUS
  Administered 2018-12-29: 1.5 ug/kg/h via INTRAVENOUS
  Administered 2018-12-29: 1.4 ug/kg/h via INTRAVENOUS
  Administered 2018-12-29: 1.5 ug/kg/h via INTRAVENOUS
  Administered 2018-12-29: 1.6 ug/kg/h via INTRAVENOUS
  Administered 2018-12-29: 1.5 ug/kg/h via INTRAVENOUS
  Administered 2018-12-29: 1.6 ug/kg/h via INTRAVENOUS
  Administered 2018-12-29: 1.5 ug/kg/h via INTRAVENOUS
  Administered 2018-12-30: 1.4 ug/kg/h via INTRAVENOUS
  Administered 2018-12-30: 1.5 ug/kg/h via INTRAVENOUS
  Administered 2018-12-30: 1.4 ug/kg/h via INTRAVENOUS
  Administered 2018-12-30: 1.5 ug/kg/h via INTRAVENOUS
  Administered 2018-12-30: 1.4 ug/kg/h via INTRAVENOUS
  Administered 2018-12-30: 1.5 ug/kg/h via INTRAVENOUS
  Administered 2018-12-30 (×2): 2 ug/kg/h via INTRAVENOUS
  Administered 2018-12-30: 1.5 ug/kg/h via INTRAVENOUS
  Administered 2018-12-31 (×7): 2 ug/kg/h via INTRAVENOUS
  Filled 2018-12-26 (×3): qty 100
  Filled 2018-12-26: qty 200
  Filled 2018-12-26 (×11): qty 100
  Filled 2018-12-26 (×3): qty 200
  Filled 2018-12-26: qty 400
  Filled 2018-12-26: qty 200
  Filled 2018-12-26 (×2): qty 100
  Filled 2018-12-26: qty 200
  Filled 2018-12-26 (×2): qty 100
  Filled 2018-12-26: qty 200
  Filled 2018-12-26: qty 100
  Filled 2018-12-26: qty 200
  Filled 2018-12-26: qty 300

## 2018-12-26 NOTE — Progress Notes (Signed)
Patient being bathed at 0500. Patient was on the then max rate of precedex, along with a fentanyl drip running. PRN fentanyl push given for early agitation. Patient became severely agitated and fighting vent. Patient oxygen saturation dropped into 50s and 60s. RT bagged patient. Patient was fighting bagging. E-Link MD gave verbal order to give an early dose of PRN midazolam and sublimaze, along with a verbal order for 10 mg of vecuronium. MD Scatliff came and assessed patient and worked with ventilator. Patient currently has O2 sat of 100% and respiration rate of 18.

## 2018-12-26 NOTE — Progress Notes (Signed)
NAME:  Thomas Adams, MRN:  [id number], DOB:  1961-03-11, LOS: 6 ADMISSION DATE:  12/24/2018, CONSULTATION DATE:  12/26/18 REFERRING MD:  Dr. Germaine Pomfret , CHIEF COMPLAINT:  Left side weakness   Brief History   58 yr old with history of HTN, afib, febrile and coughing x 1 d, found with left facial droop, left sided weakness 5/10. 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 Brought to ICU intubated  Past Medical History  HTN ESRD, dialysis TThSat Afib w/ RVR  Significant Hospital Events   5/10 Angiogram in IR >> no clot retraction due to distal location of thrombus; hypotension from meds 5/11 d/c diprivan due to triglyceride levels 5/12 start CRRT; start amiodarone 5/14 Off pressors, on CRRT  Consults:  Nephrology  ID Cardiology  Procedures:  ETT 5/10 >>  Rt radial aline 5/10 >>  Rt IJ HD cath 5/11 >>   Significant Diagnostic Tests:  CT angio head/neck 5/10 >> Rt M2 occlusion, severe Lt MCA stenosis Echo 5/10 >> EF greater than 65%, severe LCH, severe MR MRI brain 5/11 >> multifocal acute ischemia scattered in both cerebral and cerebellar hemispheres  Micro Data:  COVID 5/10 >> negative Blood 5/10 >> MSSA  Antimicrobials:  Cefepime 5/10 >> 5/11 Vancomycin 5/10 >> 5/11 Ancef 5/11 >>5/13 Nafcillin 5/13 >  Subjective:   Remains critically ill, intubated On low-dose Levophed Developed hypotension with CRRT filter clotted A. fib RVR requiring bolus of amiodarone 12/26/2018 decompensated early in the shift, required increased sedation this morning Received 10 mg of rocuronium this morning  Objective   Blood pressure 99/61, pulse (!) 105, temperature 99 F (37.2 C), temperature source Oral, resp. rate 18, height 5\' 9"  (1.753 m), weight 96.1 kg, SpO2 100 %.    Vent Mode: PCV FiO2 (%):  [40 %-100 %] 100 % Set Rate:  [14 bmp-22 bmp] 18 bmp Vt Set:  [560 mL] 560 mL PEEP:  [5 cmH20-15 cmH20] 15 cmH20 Pressure  Support:  [5 cmH20] 5 cmH20 Plateau Pressure:  [18 cmH20-41 cmH20] 41 cmH20   Intake/Output Summary (Last 24 hours) at 12/26/2018 0746 Last data filed at 12/26/2018 0700 Gross per 24 hour  Intake 3122.92 ml  Output 590 ml  Net 2532.92 ml   Filed Weights   12/16/2018 1138 01/10/2019 1500 12/25/18 0440  Weight: 87.2 kg 85.3 kg 96.1 kg    Examination: General - adult male, intubated, NAD  HEENT: Endotracheal tube in place Cardiac -S1-S2 appreciated, no JVD Chest -decreased breath sounds bilaterally Abdomen -soft, bowel sounds appreciated Extremities -bilateral edema, no clubbing, no cyanosis Skin -warm and dry Neuro -sedated, not following commands  Resolved Hospital Problem list     Assessment & Plan:   Acute respiratory failure due to airway compromise, in the setting of CVA Pulmonary edema Plan: Agitation requiring increasing sedation this morning Breathing trials as tolerated Not an extubation candidate today Extubation candidate today-agitation this morning, chest x-ray worsening  Septic shock, 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 Plan -Wean Levophed as tolerated-does had to be escalated -Continue nafcillin per ID -Trend WBC  CVA Thrombectomy by IR unsuccessful Plan -Neurology following -Aspirin  End-stage renal disease Plan -Nephrology following -Hemodialysis as tolerated-was previously on hemodialysis, CRRT 5/12-5/14 -To be restarted on CRRT  Agitation -Continue Precedex, goal RASS 0 to -1  Anemia of critical illness Thrombocytopenia Plan -Trend CBC -Transfuse hemoglobin less than 7  Infective endocarditis Mitral regurgitation Atrial fibrillation-not on anticoagulation -We will require 6 weeks  of antibiotics -TEE if surgery planned -On amiodarone gtt.   Best practice:  Diet: N.p.o. DVT prophylaxis: SCDs GI prophylaxis: Protonix Mobility: Bedrest Code Status: Full code Disposition: ICU  With agitation overnight-not a candidate for  extubation today Dialysis-to resume CRRT today Ventilator adjustment as needed-ABG at [phone] noted  The patient is critically ill with multiple organ system failure and requires high complexity decision making for assessment and support, frequent evaluation and titration of therapies, advanced monitoring, review of radiographic studies and interpretation of complex data.    Critical Care Time devoted to patient care services, exclusive of separately billable procedures, described in this note is 31 minutes.  Sherrilyn Rist, MD [phone]

## 2018-12-26 NOTE — Progress Notes (Signed)
STROKE TEAM PROGRESS NOTE   INTERVAL HISTORY Patient remains intubated and on precedex. Overnight agitated and desat, put on fentanyl drip. Continued on levophed. Not able to go for HD and continued bedside CRRT. Pt not responsive on exam. INR 1.5  Vitals:   12/26/18 1200 12/26/18 1215 12/26/18 1230 12/26/18 1245  BP: (!) 87/58 106/66 104/68 109/63  Pulse: (!) 117 (!) 117 (!) 119 (!) 119  Resp: 18 18 18 18   Temp: 99.3 F (37.4 C)     TempSrc: Axillary     SpO2: 100% 100% 100% 100%  Weight:      Height:        CBC:  Recent Labs  Lab 12/30/2018 1139  12/25/18 0500  12/26/18 0551 12/26/18 0738  WBC 20.2*   < > 22.9*  --   --  34.8*  NEUTROABS 17.7*  --   --   --   --   --   HGB 11.0*   < > 6.9*   < > 9.2* 8.9*  8.2*  HCT 31.6*   < > 19.9*   < > 27.0* 26.7*  24.0*  MCV 92.1   < > 91.7  --   --  92.7  PLT 75*   < > 121*  --   --  183   < > = values in this interval not displayed.    Basic Metabolic Panel:  Recent Labs  Lab 12/25/18 0500 12/25/18 1730 12/26/18 0551 12/26/18 0738  NA 136 138 137 138  138  K 4.4 4.7 4.5 4.6  4.8  CL 102 102  --  100  CO2 22 25  --  20*  GLUCOSE 142* 121*  --  177*  BUN 83* 105*  --  131*  CREATININE 5.40* 6.22*  --  6.87*  CALCIUM 8.8* 9.0  --  9.4  MG 2.6*  --   --  2.9*  PHOS 4.1 4.3  --  5.9*   Ct Angio Head W Or Wo Contrast  Result Date: 12/25/2018 CLINICAL DATA:  Left-sided weakness, facial droop, and slurred speech. EXAM: CT ANGIOGRAPHY HEAD AND NECK CT PERFUSION BRAIN TECHNIQUE: Multidetector CT imaging of the head and neck was performed using the standard protocol during bolus administration of intravenous contrast. Multiplanar CT image reconstructions and MIPs were obtained to evaluate the vascular anatomy. Carotid stenosis measurements (when applicable) are obtained utilizing NASCET criteria, using the distal internal carotid diameter as the denominator. Multiphase CT imaging of the brain was performed following IV bolus  contrast injection. Subsequent parametric perfusion maps were calculated using RAPID software. CONTRAST:  188mL OMNIPAQUE IOHEXOL 350 MG/ML SOLN COMPARISON:  None. FINDINGS: CTA NECK FINDINGS The study is motion degraded including moderate to severe motion artifact through the larynx which limits assessment of the carotid bifurcations. Aortic arch: Standard 3 vessel aortic arch with mild atherosclerotic plaque. No significant arch vessel origin stenosis. Right carotid system: Patent with moderate calcified atherosclerosis about the carotid bifurcation. No evidence of dissection or significant stenosis within limitations of motion artifact. Left carotid system: Patent with mild calcified atherosclerosis about the carotid bifurcation without evidence of significant stenosis or dissection. Vertebral arteries: Patent with the left being mildly dominant. Calcified atherosclerosis at the vertebral artery origins results in moderate to severe stenosis bilaterally. Skeleton: Mild cervical disc degeneration. 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 Upper chest: Clear lung apices. Review of the MIP images confirms the above findings CTA HEAD FINDINGS Anterior circulation: The  internal carotid arteries are patent from skull base to carotid termini with calcified atherosclerosis resulting in mild cavernous stenosis on the right. The right M1 segment is widely patent. Branch vessel assessment is limited by motion artifact, however there is a suspected mid right M2 branch vessel occlusion. The left MCA appears duplicated with suggestion of severe stenosis of the more superior origin although motion artifact may be contributing to this appearance. ACAs are patent without evidence of flow limiting proximal stenosis. No aneurysm is identified. Posterior circulation: The intracranial vertebral arteries are patent to the basilar without evidence of significant stenosis. P ICAs and a ICAs are not well  evaluated. SCA is are patent. The basilar artery is widely patent. There is a small right posterior communicating artery. PCAs are patent without evidence of flow limiting proximal stenosis allowing for suspected artifactual narrowing in the proximal right P2 segment. No aneurysm is identified. Venous sinuses: Patent. Anatomic variants: None. Review of the MIP images confirms the above findings CT Brain Perfusion Findings: ASPECTS: 10 CBF (<30%) Volume: 2mL Perfusion (Tmax>6.0s) volume: 76mL Mismatch Volume: 39mL Infarction Location: No core infarct by standard RAPID criteria. Penumbra anterolaterally in the right parietal lobe. IMPRESSION: 1. Motion degraded examination with limited assessment of the small and medium-sized intracranial arteries. 2. Suspected mid right M2 branch vessel occlusion with 9 mL penumbra in the right parietal lobe and no core infarct based on CTP. 3. No more proximal large vessel occlusion. 4. Patent cervical carotid arteries without evidence of significant stenosis. 5. 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 6. Possible severe left MCA origin stenosis. 7.  Aortic Atherosclerosis (ICD10-I70.0). These results were called by telephone at the time of interpretation on 01/03/2019 at 12:29 pm to Dr. Kerney Elbe , who verbally acknowledged these results. Electronically Signed   By: Logan Bores M.D.   On: 12/26/2018 12:59   Ct Angio Neck W Or Wo Contrast  Result Date: 12/11/2018 CLINICAL DATA:  Left-sided weakness, facial droop, and slurred speech. EXAM: CT ANGIOGRAPHY HEAD AND NECK CT PERFUSION BRAIN TECHNIQUE: Multidetector CT imaging of the head and neck was performed using the standard protocol during bolus administration of intravenous contrast. Multiplanar CT image reconstructions and MIPs were obtained to evaluate the vascular anatomy. Carotid stenosis measurements (when applicable) are obtained utilizing NASCET criteria, using the distal internal  carotid diameter as the denominator. Multiphase CT imaging of the brain was performed following IV bolus contrast injection. Subsequent parametric perfusion maps were calculated using RAPID software. CONTRAST:  165mL OMNIPAQUE IOHEXOL 350 MG/ML SOLN COMPARISON:  None. FINDINGS: CTA NECK FINDINGS The study is motion degraded including moderate to severe motion artifact through the larynx which limits assessment of the carotid bifurcations. Aortic arch: Standard 3 vessel aortic arch with mild atherosclerotic plaque. No significant arch vessel origin stenosis. Right carotid system: Patent with moderate calcified atherosclerosis about the carotid bifurcation. No evidence of dissection or significant stenosis within limitations of motion artifact. Left carotid system: Patent with mild calcified atherosclerosis about the carotid bifurcation without evidence of significant stenosis or dissection. Vertebral arteries: Patent with the left being mildly dominant. Calcified atherosclerosis at the vertebral artery origins results in moderate to severe stenosis bilaterally. Skeleton: Mild cervical disc degeneration. 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 Upper chest: Clear lung apices. Review of the MIP images confirms the above findings CTA HEAD FINDINGS Anterior circulation: The internal carotid arteries are patent from skull base to carotid termini with calcified atherosclerosis  resulting in mild cavernous stenosis on the right. The right M1 segment is widely patent. Branch vessel assessment is limited by motion artifact, however there is a suspected mid right M2 branch vessel occlusion. The left MCA appears duplicated with suggestion of severe stenosis of the more superior origin although motion artifact may be contributing to this appearance. ACAs are patent without evidence of flow limiting proximal stenosis. No aneurysm is identified. Posterior circulation: The intracranial vertebral  arteries are patent to the basilar without evidence of significant stenosis. P ICAs and a ICAs are not well evaluated. SCA is are patent. The basilar artery is widely patent. There is a small right posterior communicating artery. PCAs are patent without evidence of flow limiting proximal stenosis allowing for suspected artifactual narrowing in the proximal right P2 segment. No aneurysm is identified. Venous sinuses: Patent. Anatomic variants: None. Review of the MIP images confirms the above findings CT Brain Perfusion Findings: ASPECTS: 10 CBF (<30%) Volume: 31mL Perfusion (Tmax>6.0s) volume: 67mL Mismatch Volume: 33mL Infarction Location: No core infarct by standard RAPID criteria. Penumbra anterolaterally in the right parietal lobe. IMPRESSION: 1. Motion degraded examination with limited assessment of the small and medium-sized intracranial arteries. 2. Suspected mid right M2 branch vessel occlusion with 9 mL penumbra in the right parietal lobe and no core infarct based on CTP. 3. No more proximal large vessel occlusion. 4. Patent cervical carotid arteries without evidence of significant stenosis. 5. 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 6. Possible severe left MCA origin stenosis. 7.  Aortic Atherosclerosis (ICD10-I70.0). These results were called by telephone at the time of interpretation on 12/16/2018 at 12:29 pm to Dr. Kerney Elbe , who verbally acknowledged these results. Electronically Signed   By: Logan Bores M.D.   On: 01/04/2019 12:59   Mr Brain Wo Contrast  Result Date: 12/21/2018 CLINICAL DATA:  Stroke follow-up EXAM: MRI HEAD WITHOUT CONTRAST TECHNIQUE: Multiplanar, multiecho pulse sequences of the brain and surrounding structures were obtained without intravenous contrast. COMPARISON:  CTA head neck 12/13/2018 FINDINGS: BRAIN: Multifocal abnormal diffusion restriction within both cerebellar hemispheres and scattered throughout multiple bilateral supratentorial  territories. The largest area of ischemia is in the right frontal operculum and insula. The midline structures are normal. No midline shift or other mass effect. Early confluent hyperintense T2-weighted signal of the periventricular and deep white matter, most commonly due to chronic ischemic microangiopathy. Generalized atrophy without lobar predilection. Multiple scattered foci of microhemorrhage, many of which correspond to areas of ischemia. No mass lesion. VASCULAR: Abnormal flow void within the right transverse sinus. The arterial flow voids are preserved. SKULL AND UPPER CERVICAL SPINE: Calvarial bone marrow signal is normal. There is no skull base mass. Visualized upper cervical spine and soft tissues are normal. SINUSES/ORBITS: Fluid level in the maxillary sinuses. The orbits are normal. IMPRESSION: 1. Multifocal acute ischemia scattered throughout both cerebral and cerebellar hemispheres, with the largest area located in the right frontal operculum and insula. 2. Multifocal petechiae/microhemorrhage at the sites of ischemic infarction. No intraparenchymal hematoma or mass effect. 3. Abnormal flow void in the right transverse sinus could indicate dural venous sinus thrombosis. However, on the earlier CTA and the interventional angiogram, the sinus was clearly patent. Electronically Signed   By: Ulyses Jarred M.D.   On: 12/21/2018 02:28   Ct Cerebral Perfusion W Contrast  Result Date: 12/19/2018 CLINICAL DATA:  Left-sided weakness, facial droop, and slurred speech. EXAM: CT ANGIOGRAPHY HEAD AND NECK CT PERFUSION BRAIN TECHNIQUE: Multidetector CT  imaging of the head and neck was performed using the standard protocol during bolus administration of intravenous contrast. Multiplanar CT image reconstructions and MIPs were obtained to evaluate the vascular anatomy. Carotid stenosis measurements (when applicable) are obtained utilizing NASCET criteria, using the distal internal carotid diameter as the  denominator. Multiphase CT imaging of the brain was performed following IV bolus contrast injection. Subsequent parametric perfusion maps were calculated using RAPID software. CONTRAST:  164mL OMNIPAQUE IOHEXOL 350 MG/ML SOLN COMPARISON:  None. FINDINGS: CTA NECK FINDINGS The study is motion degraded including moderate to severe motion artifact through the larynx which limits assessment of the carotid bifurcations. Aortic arch: Standard 3 vessel aortic arch with mild atherosclerotic plaque. No significant arch vessel origin stenosis. Right carotid system: Patent with moderate calcified atherosclerosis about the carotid bifurcation. No evidence of dissection or significant stenosis within limitations of motion artifact. Left carotid system: Patent with mild calcified atherosclerosis about the carotid bifurcation without evidence of significant stenosis or dissection. Vertebral arteries: Patent with the left being mildly dominant. Calcified atherosclerosis at the vertebral artery origins results in moderate to severe stenosis bilaterally. Skeleton: Mild cervical disc degeneration. 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 Upper chest: Clear lung apices. Review of the MIP images confirms the above findings CTA HEAD FINDINGS Anterior circulation: The internal carotid arteries are patent from skull base to carotid termini with calcified atherosclerosis resulting in mild cavernous stenosis on the right. The right M1 segment is widely patent. Branch vessel assessment is limited by motion artifact, however there is a suspected mid right M2 branch vessel occlusion. The left MCA appears duplicated with suggestion of severe stenosis of the more superior origin although motion artifact may be contributing to this appearance. ACAs are patent without evidence of flow limiting proximal stenosis. No aneurysm is identified. Posterior circulation: The intracranial vertebral arteries are patent to the  basilar without evidence of significant stenosis. P ICAs and a ICAs are not well evaluated. SCA is are patent. The basilar artery is widely patent. There is a small right posterior communicating artery. PCAs are patent without evidence of flow limiting proximal stenosis allowing for suspected artifactual narrowing in the proximal right P2 segment. No aneurysm is identified. Venous sinuses: Patent. Anatomic variants: None. Review of the MIP images confirms the above findings CT Brain Perfusion Findings: ASPECTS: 10 CBF (<30%) Volume: 60mL Perfusion (Tmax>6.0s) volume: 65mL Mismatch Volume: 22mL Infarction Location: No core infarct by standard RAPID criteria. Penumbra anterolaterally in the right parietal lobe. IMPRESSION: 1. Motion degraded examination with limited assessment of the small and medium-sized intracranial arteries. 2. Suspected mid right M2 branch vessel occlusion with 9 mL penumbra in the right parietal lobe and no core infarct based on CTP. 3. No more proximal large vessel occlusion. 4. Patent cervical carotid arteries without evidence of significant stenosis. 5. 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 6. Possible severe left MCA origin stenosis. 7.  Aortic Atherosclerosis (ICD10-I70.0). These results were called by telephone at the time of interpretation on 12/14/2018 at 12:29 pm to Dr. Kerney Elbe , who verbally acknowledged these results. Electronically Signed   By: Logan Bores M.D.   On: 12/16/2018 12:59   Dg Chest Port 1 View  Result Date: 12/26/2018 CLINICAL DATA:  Encounter for central line placement. EXAM: PORTABLE CHEST 1 VIEW COMPARISON:  12/26/2018 FINDINGS: There is a right IJ catheter with tip in the projection of the SVC. Left IJ catheter has been recently placed  and tip is projecting over the cavoatrial junction. ET tube tip is above the carina. There is an enteric tube with tip just below the GE junction. Normal heart size. Diffuse bilateral  pulmonary opacities are again noted and appear unchanged from previous exam IMPRESSION: 1. Interval placement of a left IJ catheter with tip projecting over the cavoatrial junction. No pneumothorax identified. 2. No change in aeration to lungs compared with previous exam. Electronically Signed   By: Kerby Moors M.D.   On: 12/26/2018 18:05   Dg Chest Port 1 View  Result Date: 12/26/2018 CLINICAL DATA:  Respiratory failure EXAM: PORTABLE CHEST 1 VIEW COMPARISON:  12/25/2018 FINDINGS: Endotracheal tube terminates 2.5 cm above the carina. Right IJ venous catheter terminates in the mid SVC. Enteric tube terminates in the gastric cardia. Multifocal patchy opacities in the lungs bilaterally, left upper lobe predominant, progressive from the prior and suspicious for multifocal pneumonia. No definite pleural effusions. No pneumothorax. The heart is top-normal in size. IMPRESSION: Endotracheal tube terminates 2.5 cm above the carina. Multifocal pneumonia, mildly progressive. Electronically Signed   By: Julian Hy M.D.   On: 12/26/2018 06:54   Dg Chest Port 1 View  Result Date: 12/25/2018 CLINICAL DATA:  58 year old male with cerebral infarcts, sepsis, bacteremia. EXAM: PORTABLE CHEST 1 VIEW COMPARISON:  12/24/2018 and earlier. FINDINGS: Portable AP semi upright view at 0644 hours. Stable endotracheal tube and right IJ central line. Enteric tube tip at the level of the gastric body, the side hole is not clearly identified. Improved ventilation since yesterday with decreasing indistinct perihilar opacity. Mildly larger lung volumes. Stable cardiac size and mediastinal contours. No pneumothorax, pleural effusion or areas of worsening ventilation. Stable visible bowel gas pattern. IMPRESSION: 1. Stable lines and tubes. 2. Improved ventilation since yesterday with decreasing indistinct perihilar opacity. 3. No new cardiopulmonary abnormality. Electronically Signed   By: Genevie Ann M.D.   On: 12/25/2018 09:21   Dg  Chest Port 1 View  Result Date: 12/24/2018 CLINICAL DATA:  Acute respiratory failure. EXAM: PORTABLE CHEST 1 VIEW COMPARISON:  Yesterday. FINDINGS: Endotracheal tube, enteric tube, and right central line remain in place. Persistent low lung volumes. Ill-defined bilateral lung opacities, left greater than right, with slight progression from prior. Worsening retrocardiac and right basilar opacity with more confluent density. No large pleural effusion. No pneumothorax. IMPRESSION: Worsening bilateral perihilar lung opacities, left greater than right. This may be pulmonary edema, ARDS, or multifocal pneumonia. More confluent opacities at the bases is also progressed. Electronically Signed   By: Keith Rake M.D.   On: 12/24/2018 03:58   Dg Chest Port 1 View  Result Date: 12/23/2018 CLINICAL DATA:  58 year old male with cerebral infarcts, sepsis, bacteremia, respiratory failure. EXAM: PORTABLE CHEST 1 VIEW COMPARISON:  12/22/2018 and earlier. FINDINGS: Portable AP semi upright view at 0649 hours. Stable endotracheal tube tip between the level the clavicles and carina. Enteric tube courses to the abdomen, tip not included. Stable right IJ central line. Mildly larger lung volumes and regressed retrocardiac opacity with air bronchograms. The left hemidiaphragm is visible today. Mildly asymmetric increased left lung interstitial opacity is stable. No pneumothorax or new pulmonary opacity. Normal cardiac size and mediastinal contours. Negative visible bowel gas pattern. IMPRESSION: 1. Stable lines and tubes. 2. Mildly larger lung volumes and regressed left lower lobe collapse or consolidation. 3. Asymmetric interstitial opacity in the left lung persists, favor infection over asymmetric edema. 4. No new cardiopulmonary abnormality. Electronically Signed   By: Herminio Heads.D.  On: 12/23/2018 08:38   Dg Chest Port 1 View  Result Date: 12/22/2018 CLINICAL DATA:  58 year old male with cerebral infarcts, sepsis, staph  bacteremia, respiratory failure. EXAM: PORTABLE CHEST 1 VIEW COMPARISON:  12/21/2018 and earlier. FINDINGS: Portable AP semi upright view at 0528 hours. ET tube in good position between the clavicles and carina. Enteric tube courses to the abdomen, side hole is at the level of the GE junction. Stable right IJ central line. Left lower lobe collapse or consolidation with some air bronchograms. Additional patchy left perihilar and medial right lung base opacity. No pneumothorax or pulmonary edema. No definite pleural effusion. No acute osseous abnormality identified. IMPRESSION: 1. Enteric tube side hole at the level of the GE junction, advance 5 cm to ensure side hole placement within the stomach. 2.  Otherwise stable lines and tubes. 3. Stable ventilation with left lower lobe collapse or consolidation and patchy additional perihilar opacity. Electronically Signed   By: Genevie Ann M.D.   On: 12/22/2018 07:23   Dg Chest Port 1 View  Result Date: 12/21/2018 CLINICAL DATA:  Central line placement. EXAM: PORTABLE CHEST 1 VIEW COMPARISON:  01/08/2019 FINDINGS: Endotracheal tube with tip 3.1 cm above the carina. Interval advancement of the nasogastric tube which has tip just below the expected region of the gastroesophageal junction as this could be advanced another 5 cm. Right IJ central venous catheter has tip over the SVC. Lungs are adequately inflated demonstrate hazy opacification over the left lung which may be due to layering effusion with atelectasis. Subtle hazy opacification of the perihilar regions which may be due to mild interstitial edema. Cardiomediastinal silhouette and remainder of the exam is unchanged. IMPRESSION: Hazy opacification over the left lung which may be due to layering effusion with atelectasis. Mild hazy opacification of the perihilar regions which suggest mild interstitial edema. Tubes and lines as described. Electronically Signed   By: Marin Olp M.D.   On: 12/21/2018 11:39   Dg Chest  Portable 1 View  Result Date: 12/18/2018 CLINICAL DATA:  Acute LEFT CVA. EXAM: PORTABLE CHEST 1 VIEW COMPARISON:  03/04/2016 radiographs FINDINGS: An endotracheal tube with tip 4 cm above the carina and OG tube with tip overlying the distal esophagus noted. UPPER limits normal heart size noted. There is no evidence of focal airspace disease, pulmonary edema, suspicious pulmonary nodule/mass, pleural effusion, or pneumothorax. No acute bony abnormalities are identified. IMPRESSION: Endotracheal tube with tip 4 cm above the carina. OG tube with tip overlying the distal esophagus-recommend advancement. No acute cardiopulmonary disease. Electronically Signed   By: Margarette Canada M.D.   On: 12/15/2018 14:45   Dg Abd Portable 1 View  Result Date: 01/01/2019 CLINICAL DATA:  OG tube placement. EXAM: PORTABLE ABDOMEN - 1 VIEW COMPARISON:  None. FINDINGS: An OG tube is identified with tip overlying the distal esophagus. Recommend advancement. The bowel gas pattern is unremarkable. IMPRESSION: OG tube with tip overlying the distal esophagus-recommend advancement. Electronically Signed   By: Margarette Canada M.D.   On: 12/12/2018 14:46   Ct Head Code Stroke Wo Contrast  Result Date: 12/24/2018 CLINICAL DATA:  Code stroke. Left-sided weakness, facial droop, and slurred speech. EXAM: CT HEAD WITHOUT CONTRAST TECHNIQUE: Contiguous axial images were obtained from the base of the skull through the vertex without intravenous contrast. COMPARISON:  Head CT 09/17/2013 FINDINGS: Brain: There is no evidence of acute infarct, intracranial hemorrhage, mass, midline shift, or extra-axial fluid collection. Patchy cerebral white matter hypodensities are similar to the prior CT and  nonspecific but compatible with moderately age advanced chronic small vessel ischemic disease. Mild prominence of the lateral ventricles is also unchanged and may reflect central predominant cerebral atrophy. Vascular: Calcified atherosclerosis at the skull base.  No hyperdense vessel. Skull: No fracture or focal osseous lesion. Sinuses/Orbits: Mild mucosal thickening in the maxillary sinuses. Clear mastoid air cells. Left cataract extraction. Other: None. ASPECTS [facility] Stroke Program Early CT Score) - Ganglionic level infarction (caudate, lentiform nuclei, internal capsule, insula, M1-M3 cortex): 7 - Supraganglionic infarction (M4-M6 cortex): 3 Total score (0-10 with 10 being normal): 10 IMPRESSION: 1. No evidence of acute intracranial abnormality. 2. ASPECTS is 10. 3. Moderate chronic small vessel ischemic disease. These results were communicated to Dr. Cheral Marker at 12:09 pm on 12/30/2018 by text page via the [facility] messaging system. Electronically Signed   By: Logan Bores M.D.   On: 12/19/2018 12:37   Ir Angio Intra Extracran Sel Com Carotid Innominate Bilat Mod Sed  Result Date: 12/24/2018 CLINICAL DATA:  Left-sided facial droop, severe dysarthria, and left upper weakness. CT angiogram revealing occlusion of the right middle cerebral artery questionable M2 division. EXAM: IR ANGIO VERTEBRAL SEL SUBCLAVIAN INNOMINATE UNI RIGHT MOD SED; IR ANGIO VERTEBRAL SEL VERTEBRAL UNI LEFT MOD SED; BILATERAL COMMON CAROTID AND INNOMINATE ANGIOGRAPHY COMPARISON:  CT angiogram of the head and neck of 12/13/2018. MEDICATIONS: Heparin none units IV; Ancef 2 g IV antibiotic was administered within 1 hour of the procedure. ANESTHESIA/SEDATION: General anesthesia. CONTRAST:  Isovue 300 approximately 50 mL. FLUOROSCOPY TIME:  Fluoroscopy Time: 5 minutes 0 seconds (712 mGy). COMPLICATIONS: None immediate. TECHNIQUE: Informed written consent was obtained from the patient's mother after a thorough discussion of the procedural risks, benefits and alternatives. All questions were addressed. Maximal Sterile Barrier Technique was utilized including caps, mask, sterile gowns, sterile gloves, sterile drape, hand hygiene and skin antiseptic. A timeout was performed prior to the initiation of the  procedure. The right groin was prepped and draped in the usual sterile fashion. Thereafter using modified Seldinger technique, transfemoral access into the right common femoral artery was obtained without difficulty. Over a 0.035 inch guidewire, a 5 French Pinnacle sheath was inserted. Through this, and also over 0.035 inch guidewire, a 5 [place] JB 1 catheter was advanced to the aortic arch region and selectively positioned in the right common carotid artery, , the right subclavian artery, the left common carotid artery and the left vertebral artery. FINDINGS: The right subclavian arteriogram demonstrates hypoplastic right vertebral artery with a normal origin. The vessel is seen to opacify to the cranial skull base where it opacifies the right vertebrobasilar junction and the right posterior-inferior cerebellar artery. The opacified portion of the basilar artery is widely patent. The right common carotid arteriogram demonstrates the right external carotid artery and its major branches to be widely patent. The right internal carotid artery at the bulb to the cranial skull base demonstrates wide patency. The petrous, cavernous and the supraclinoid segments are widely patent. The right middle cerebral artery and the right anterior cerebral artery opacify into the capillary and venous phases. There is a tapered severe stenosis followed by occlusion of a mid perisylvian branch of the superior division of the right middle cerebral artery in the M3 region. Extensive collaterals are seen retrogradely opacifying this distribution. Dural venous sinuses are widely patent. The left common carotid arteriogram demonstrates the left external carotid artery and its major branches to be widely patent. The left internal carotid artery at the bulb to the cranial skull base demonstrates  wide patency. The petrous, cavernous and the supraclinoid segments are widely patent. The left middle cerebral artery demonstrates an early  bifurcation a developmental variation, the left anterior cerebral artery opacifies with the left middle cerebral artery into the capillary and the venous phases. The left vertebral artery origin is widely patent. The vessel opacifies to the cranial skull base. Wide patency is seen of the left vertebrobasilar junction and the left posterior-inferior cerebellar artery which is hypoplastic. Suggestion of a left anterior-inferior cerebellar artery/posterior-inferior cerebellar artery complex is seen. The opacified portion of the basilar artery, the posterior cerebral arteries, the superior cerebellar arteries and the anterior-inferior cerebellar arteries is noted into the capillary and venous phases. Unopacified blood is seen in the basilar artery from the contralateral vertebral artery. 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 PLAN: Follow-up with the referring neurologist. Electronically Signed   By: Luanne Bras M.D.   On: 12/21/2018 13:24   Ir Angio Vertebral Sel Subclavian Innominate Uni R Mod Sed  Result Date: 12/24/2018 CLINICAL DATA:  Left-sided facial droop, severe dysarthria, and left upper weakness. CT angiogram revealing occlusion of the right middle cerebral artery questionable M2 division. EXAM: IR ANGIO VERTEBRAL SEL SUBCLAVIAN INNOMINATE UNI RIGHT MOD SED; IR ANGIO VERTEBRAL SEL VERTEBRAL UNI LEFT MOD SED; BILATERAL COMMON CAROTID AND INNOMINATE ANGIOGRAPHY COMPARISON:  CT angiogram of the head and neck of 12/13/2018. MEDICATIONS: Heparin none units IV; Ancef 2 g IV antibiotic was administered within 1 hour of the procedure. ANESTHESIA/SEDATION: General anesthesia. CONTRAST:  Isovue 300 approximately 50 mL. FLUOROSCOPY TIME:  Fluoroscopy Time: 5 minutes 0 seconds (712 mGy). COMPLICATIONS: None immediate. TECHNIQUE: Informed written consent was obtained from the patient's  mother after a thorough discussion of the procedural risks, benefits and alternatives. All questions were addressed. Maximal Sterile Barrier Technique was utilized including caps, mask, sterile gowns, sterile gloves, sterile drape, hand hygiene and skin antiseptic. A timeout was performed prior to the initiation of the procedure. The right groin was prepped and draped in the usual sterile fashion. Thereafter using modified Seldinger technique, transfemoral access into the right common femoral artery was obtained without difficulty. Over a 0.035 inch guidewire, a 5 French Pinnacle sheath was inserted. Through this, and also over 0.035 inch guidewire, a 5 [place] JB 1 catheter was advanced to the aortic arch region and selectively positioned in the right common carotid artery, , the right subclavian artery, the left common carotid artery and the left vertebral artery. FINDINGS: The right subclavian arteriogram demonstrates hypoplastic right vertebral artery with a normal origin. The vessel is seen to opacify to the cranial skull base where it opacifies the right vertebrobasilar junction and the right posterior-inferior cerebellar artery. The opacified portion of the basilar artery is widely patent. The right common carotid arteriogram demonstrates the right external carotid artery and its major branches to be widely patent. The right internal carotid artery at the bulb to the cranial skull base demonstrates wide patency. The petrous, cavernous and the supraclinoid segments are widely patent. The right middle cerebral artery and the right anterior cerebral artery opacify into the capillary and venous phases. There is a tapered severe stenosis followed by occlusion of a mid perisylvian branch of the superior division of the right middle cerebral artery in the M3 region. Extensive collaterals are seen retrogradely opacifying this distribution. Dural venous sinuses are widely patent. The left common carotid arteriogram  demonstrates the left external  carotid artery and its major branches to be widely patent. The left internal carotid artery at the bulb to the cranial skull base demonstrates wide patency. The petrous, cavernous and the supraclinoid segments are widely patent. The left middle cerebral artery demonstrates an early bifurcation a developmental variation, the left anterior cerebral artery opacifies with the left middle cerebral artery into the capillary and the venous phases. The left vertebral artery origin is widely patent. The vessel opacifies to the cranial skull base. Wide patency is seen of the left vertebrobasilar junction and the left posterior-inferior cerebellar artery which is hypoplastic. Suggestion of a left anterior-inferior cerebellar artery/posterior-inferior cerebellar artery complex is seen. The opacified portion of the basilar artery, the posterior cerebral arteries, the superior cerebellar arteries and the anterior-inferior cerebellar arteries is noted into the capillary and venous phases. Unopacified blood is seen in the basilar artery from the contralateral vertebral artery. 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 PLAN: Follow-up with the referring neurologist. Electronically Signed   By: Luanne Bras M.D.   On: 12/21/2018 13:24   Ir Angio Vertebral Sel Vertebral Uni L Mod Sed  Result Date: 12/24/2018 CLINICAL DATA:  Left-sided facial droop, severe dysarthria, and left upper weakness. CT angiogram revealing occlusion of the right middle cerebral artery questionable M2 division. EXAM: IR ANGIO VERTEBRAL SEL SUBCLAVIAN INNOMINATE UNI RIGHT MOD SED; IR ANGIO VERTEBRAL SEL VERTEBRAL UNI LEFT MOD SED; BILATERAL COMMON CAROTID AND INNOMINATE ANGIOGRAPHY COMPARISON:  CT angiogram of the head and neck of 12/30/2018. MEDICATIONS: Heparin none units IV; Ancef 2 g IV antibiotic was  administered within 1 hour of the procedure. ANESTHESIA/SEDATION: General anesthesia. CONTRAST:  Isovue 300 approximately 50 mL. FLUOROSCOPY TIME:  Fluoroscopy Time: 5 minutes 0 seconds (712 mGy). COMPLICATIONS: None immediate. TECHNIQUE: Informed written consent was obtained from the patient's mother after a thorough discussion of the procedural risks, benefits and alternatives. All questions were addressed. Maximal Sterile Barrier Technique was utilized including caps, mask, sterile gowns, sterile gloves, sterile drape, hand hygiene and skin antiseptic. A timeout was performed prior to the initiation of the procedure. The right groin was prepped and draped in the usual sterile fashion. Thereafter using modified Seldinger technique, transfemoral access into the right common femoral artery was obtained without difficulty. Over a 0.035 inch guidewire, a 5 French Pinnacle sheath was inserted. Through this, and also over 0.035 inch guidewire, a 5 [place] JB 1 catheter was advanced to the aortic arch region and selectively positioned in the right common carotid artery, , the right subclavian artery, the left common carotid artery and the left vertebral artery. FINDINGS: The right subclavian arteriogram demonstrates hypoplastic right vertebral artery with a normal origin. The vessel is seen to opacify to the cranial skull base where it opacifies the right vertebrobasilar junction and the right posterior-inferior cerebellar artery. The opacified portion of the basilar artery is widely patent. The right common carotid arteriogram demonstrates the right external carotid artery and its major branches to be widely patent. The right internal carotid artery at the bulb to the cranial skull base demonstrates wide patency. The petrous, cavernous and the supraclinoid segments are widely patent. The right middle cerebral artery and the right anterior cerebral artery opacify into the capillary and venous phases. There is a tapered  severe stenosis followed by occlusion of a mid perisylvian branch of the superior division of the right middle cerebral artery in the M3 region. Extensive  collaterals are seen retrogradely opacifying this distribution. Dural venous sinuses are widely patent. The left common carotid arteriogram demonstrates the left external carotid artery and its major branches to be widely patent. The left internal carotid artery at the bulb to the cranial skull base demonstrates wide patency. The petrous, cavernous and the supraclinoid segments are widely patent. The left middle cerebral artery demonstrates an early bifurcation a developmental variation, the left anterior cerebral artery opacifies with the left middle cerebral artery into the capillary and the venous phases. The left vertebral artery origin is widely patent. The vessel opacifies to the cranial skull base. Wide patency is seen of the left vertebrobasilar junction and the left posterior-inferior cerebellar artery which is hypoplastic. Suggestion of a left anterior-inferior cerebellar artery/posterior-inferior cerebellar artery complex is seen. The opacified portion of the basilar artery, the posterior cerebral arteries, the superior cerebellar arteries and the anterior-inferior cerebellar arteries is noted into the capillary and venous phases. Unopacified blood is seen in the basilar artery from the contralateral vertebral artery. 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 PLAN: Follow-up with the referring neurologist. Electronically Signed   By: Luanne Bras M.D.   On: 12/21/2018 13:24     PHYSICAL EXAM:     Temp:  [97.9 F (36.6 C)-99.3 F (37.4 C)] 99.3 F (37.4 C) (05/16 1200) Pulse Rate:  [88-155] 119 (05/16 1245) Resp:  [14-36] 18 (05/16 1245) BP: (69-175)/(41-131) 109/63 (05/16 1245) SpO2:  [51 %-100 %] 100 % (05/16  1245) FiO2 (%):  [40 %-100 %] 80 % (05/16 1118)  General - Well nourished, well developed, intubated on sedation.  Ophthalmologic - fundi not visualized due to noncooperation.  Cardiovascular - Regular rate and rhythm.  Neuro - intubated on sedation, eyes closed, not following commands. With forced eye opening, eyes in mid position, not blinking to visual threat, doll's eyes sluggish and limited, not tracking, pupil pinpoint bilaterally. Corneal reflex weak bilaterally, gag and cough absent. Breathing slightly over the vent.  Facial symmetry not able to test due to ET tube.  Tongue midline in mouth. On pain stimulation, no movement in all extremities. DTR  diminished and no babinski. Sensation, coordination and gait not tested.    ASSESSMENT/PLAN Thomas Adams is a 58 y.o. male with history of atrial fibrillation not on AC, HTN and ESRD on HD presenting with left hemiparesis and left facial droop with dysarthria. Sent to IR but no correctable lesion.  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 Small vessel disease. ASPECTS 10.     CTA head & neck suspected mid R M2 branch occlusion w/ 56mL penumbra R parietal. No LVO. Possible severe L MCA origin stenosis. Aortic atherosclerosis   CT perfusion no core  Cerebral angio RT MCA   Sup division M3 branch tapered occlusion. No intervention  MRI  Multifocal B supratentorial infarcts.   2D Echo EF 65%. There are severe posterior mitral annular calcifications extending into the mitral lefalets with a mobile portion that most probably represents a large calcified mass partially detached from the leaflet. This can possibly be a source of embolism and stroke.  HIV negative   TEE not indicated per cardiology as meeting dx criteria for endocarditis  LDL UTC d/t high TG 691  HgbA1c 5.5  SCDs for VTE prophylaxis. Hold Heparin  given severe anemia   No antithrombotic prior to admission, now on aspirin 81 mg daily. Not an annticoagulation candidate due to endocarditis, risk of bleeding and severe anemia at this time. Consider switch to [facility] after completing 6 wk abx course. For now, continue aspirin 81 in setting of severe anemia  Therapy recommendations:  pending   Disposition:  pending   Acute Hypoxic Respiratory Failure, compromised airway  Intubated initially for IR  CXR decreasing perihilar opacity.  Weaning but hypoxic tachycardic.   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   Repeat Blood cultures - NG x 3 days   UCx no growth   Lactic acid 3.3  ID on board  Cardiology signed off  CVTS Dr. Roxy Manns will formally consult on Monday  Atrial Fibrillation w/ RVR . Home anticoagulation:  none  . On Amiodarone   . HR 90-120s . Cardiology signed off . Not on metoprolol given hypotension . Not an AC candidate d/t endocarditis and risk for bleeding - consider in future after abx treatment is completed in 6 weeks.  Continue aspirin for now.    Hypotension in setting of sepsis  Hypertensive Emergency on admission   BP as high as 174/139 on admission  Home meds:  Norvasc 10, hydralazine 25 tid, labetalol 100 bid, lisinopril 40  BP stable now but 87/58 at midnight  Not on BP meds  On levophed  . BP goal normotensive  Hyperlipidemia  Home meds:  No statin  LDL UTC d/t high TG 691, goal < 70  Recheck lipids once off lipid based drips  Consider statin if LDL  remains elevated at that time  Dysphagia  On vent  TF per CCM  Other Stroke Risk Factors  Overweight, Body mass index is 31.29 kg/m., recommend weight loss, diet and exercise as appropriate   Other Active Problems  ESRD on HD TTS. Nephrology on board. Continued on CRRT  Anemia of chronic illness, kidney disease 9.0-8.8-7.7-6.9-xfuse 1u PRBC->8.2  Secondary hyperparathyroidism   Thrombocytopenia in setting of sepsis 54-67-83-121->183   INR 1.7->1.5  Hospital day # 6   This patient is critically ill and at significant risk of neurological worsening, death and care requires constant monitoring of vital signs, hemodynamics, respiratory and cardiac monitoring, extensive review of multiple databases, frequent neurological assessment, discussion with CCM team and medical decision making of high complexity. I spent 35 minutes of neurocritical care time  in the care of  this patient.  Rosalin Hawking, MD PhD Stroke Neurology 12/26/2018 6:55 PM    To contact Stroke Continuity provider, please refer to [email or web address] After hours, contact General Neurology

## 2018-12-26 NOTE — Procedures (Signed)
Central Venous Catheter Insertion Procedure Note Woodley Petzold [id number] 12-14-1960  Procedure: Insertion of Central Venous Catheter Indications: Drug and/or fluid administration  Procedure Details Consent: Risks of procedure as well as the alternatives and risks of each were explained to the (patient/caregiver).  Consent for procedure obtained. Time Out: Verified patient identification, verified procedure, site/side was marked, verified correct patient position, special equipment/implants available, medications/allergies/relevent history reviewed, required imaging and test results available.  time out performed Real time [place] was used to ID and cannulate vessel  Maximum sterile technique was used including antiseptics, cap, gloves, gown, hand hygiene, mask and sheet. Skin prep: Chlorhexidine; local anesthetic administered A antimicrobial bonded/coated triple lumen catheter was placed in the left internal jugular vein using the Seldinger technique.  Evaluation Blood flow good Complications: No apparent complications Patient did tolerate procedure well. Chest X-ray ordered to verify placement.  CXR: pending.  Clementeen Graham 12/26/2018, 4:22 PM  Erick Colace ACNP-BC [place] Pager # [phone] OR # [phone] if no answer

## 2018-12-26 NOTE — Progress Notes (Signed)
Subjective:  Continuing to not do well- now back on pressors- mod dose  and on 100 % fio2 - was due for IHD today but will need resumption of CRRT   Objective Vital signs in last 24 hours: Vitals:   12/26/18 0630 12/26/18 0645 12/26/18 0700 12/26/18 0725  BP: 90/60 (!) 93/56 99/61   Pulse: (!) 105 (!) 105 (!) 105   Resp: _0 Temp:      TempSrc:      SpO2: 92% 95% 97% 100%  Weight:      Height:       Weight change:   Intake/Output Summary (Last 24 hours) at 12/26/2018 0388 Last data filed at 12/26/2018 0700 Gross per 24 hour  Intake 3122.92 ml  Output 590 ml  Net 2532.92 ml   HD orders- [facility]- 4 hours 450 BFR- left AVF - EDW 88 2/2 heparin - yes  parsabiv 5, calcitriol 1.5 Last hgb 10.9- calc 9.0- phos 6.9- pth 594   Assessment/ Plan: Pt is a 58 y.o. yo male who was admitted on 12/14/2018 with an acute ischemic stroke , then found to have an MSSA bacteremia   Assessment/Plan: 1. Acute CVA- supportive care- concern for septic emboli-  2. MSSA bacteremia- on nafcillin - ID following- 6 weeks total abx for presumed IE, WBC still quite high -   AVF not felt to be source of infection.  ID says can be changed to vanc at discharge so abx can be given at OP center 3. ESRD-  Normally TTS  - CRRT from 5/12 to 5/14- now more unstable again- will need resumption of CRRT today 4. Secondary hyperparathyroidism- calcium is OK - Phos is OK - no binder  5. HTN/volume- volume and BP seem OK- no BP meds- since CRRT stopped, quite a bit positive- will try for some volume removal with CRRT  6. Anemia- hgb trending down - also platelets down- stopped heparin thru CRRT - on Aranesp.  Given one unit on 5/15 as well   7. VDRF-  Has taken a step back for unclear reasons, cont excellent care per CCM  Aniston Christman A Aasim Restivo    Labs: Basic Metabolic Panel: Recent Labs  Lab 12/25/18 0500 12/25/18 1730 12/26/18 0551 12/26/18 0738  NA 136 138 137 138  138  K 4.4 4.7 4.5 4.6  4.8  CL 102  102  --  100  CO2 22 25  --  20*  GLUCOSE 142* 121*  --  177*  BUN 83* 105*  --  131*  CREATININE 5.40* 6.22*  --  6.87*  CALCIUM 8.8* 9.0  --  9.4  PHOS 4.1 4.3  --  5.9*   Liver Function Tests: Recent Labs  Lab 12/18/2018 1139  12/25/18 0500 12/25/18 1730 12/26/18 0738  AST 53*  --   --   --   --   ALT 21  --   --   --   --   ALKPHOS 102  --   --   --   --   BILITOT 1.3*  --   --   --   --   PROT 6.8  --   --   --   --   ALBUMIN 3.3*   < > 1.7* 1.6* 1.7*   < > = values in this interval not displayed.   Recent Labs  Lab 12/22/18 0945  LIPASE 30   No results for input(s): AMMONIA in the last 168 hours. CBC:  Recent Labs  Lab 12/30/2018 1139 12/22/18 0537 12/23/18 0524 12/24/18 0540 12/25/18 0500 12/25/18 1730 12/26/18 0551 12/26/18 0738  WBC 20.2* 15.4* 18.7* 22.9* 22.9*  --   --  34.8*  NEUTROABS 17.7*  --   --   --   --   --   --   --   HGB 11.0* 8.8* 9.0* 7.7* 6.9* 7.7* 9.2* 8.9*  8.2*  HCT 31.6* 25.7* 25.8* 22.6* 19.9* 22.6* 27.0* 26.7*  24.0*  MCV 92.1 93.1 91.5 92.6 91.7  --   --  92.7  PLT 75* 54* 67* 83* 121*  --   --  183   Cardiac Enzymes: No results for input(s): CKTOTAL, CKMB, CKMBINDEX, TROPONINI in the last 168 hours. CBG: Recent Labs  Lab 12/25/18 1509 12/25/18 1921 12/25/18 2324 12/26/18 0326 12/26/18 0746  GLUCAP 102* 117* 131* 102* 114*    Iron Studies: No results for input(s): IRON, TIBC, TRANSFERRIN, FERRITIN in the last 72 hours. Studies/Results: Dg Chest Port 1 View  Result Date: 12/26/2018 CLINICAL DATA:  Respiratory failure EXAM: PORTABLE CHEST 1 VIEW COMPARISON:  12/25/2018 FINDINGS: Endotracheal tube terminates 2.5 cm above the carina. Right IJ venous catheter terminates in the mid SVC. Enteric tube terminates in the gastric cardia. Multifocal patchy opacities in the lungs bilaterally, left upper lobe predominant, progressive from the prior and suspicious for multifocal pneumonia. No definite pleural effusions. No pneumothorax.  The heart is top-normal in size. IMPRESSION: Endotracheal tube terminates 2.5 cm above the carina. Multifocal pneumonia, mildly progressive. Electronically Signed   By: Julian Hy M.D.   On: 12/26/2018 06:54   Dg Chest Port 1 View  Result Date: 12/25/2018 CLINICAL DATA:  58 year old male with cerebral infarcts, sepsis, bacteremia. EXAM: PORTABLE CHEST 1 VIEW COMPARISON:  12/24/2018 and earlier. FINDINGS: Portable AP semi upright view at 0644 hours. Stable endotracheal tube and right IJ central line. Enteric tube tip at the level of the gastric body, the side hole is not clearly identified. Improved ventilation since yesterday with decreasing indistinct perihilar opacity. Mildly larger lung volumes. Stable cardiac size and mediastinal contours. No pneumothorax, pleural effusion or areas of worsening ventilation. Stable visible bowel gas pattern. IMPRESSION: 1. Stable lines and tubes. 2. Improved ventilation since yesterday with decreasing indistinct perihilar opacity. 3. No new cardiopulmonary abnormality. Electronically Signed   By: Genevie Ann M.D.   On: 12/25/2018 09:21   Medications: Infusions: .  prismasol BGK 4/2.5 300 mL/hr at 12/24/18 1306  .  prismasol BGK 4/2.5 300 mL/hr at 12/24/18 1342  . sodium chloride 10 mL/hr at 12/26/18 0700  . dexmedetomidine (PRECEDEX) IV infusion 2 mcg/kg/hr (12/26/18 0700)  . feeding supplement (VITAL AF 1.2 CAL) Stopped (12/26/18 0500)  . fentaNYL infusion INTRAVENOUS 400 mcg/hr (12/26/18 0510)  . nafcillin IV Stopped (12/26/18 0234)  . norepinephrine (LEVOPHED) Adult infusion 14 mcg/min (12/26/18 0714)    Scheduled Medications: . amiodarone  200 mg Per Tube BID  . aspirin  81 mg Per Tube Daily  . chlorhexidine gluconate (MEDLINE KIT)  15 mL Mouth Rinse BID  . Chlorhexidine Gluconate Cloth  6 each Topical Daily  . Chlorhexidine Gluconate Cloth  6 each Topical Q0600  . darbepoetin (ARANESP) injection - DIALYSIS  150 mcg Intravenous Q Thu-HD  . feeding  supplement (PRO-STAT SUGAR FREE 64)  60 mL Per Tube QID  . mouth rinse  15 mL Mouth Rinse 10 times per day  . pantoprazole sodium  40 mg Per Tube Q24H  . sodium chloride flush  10-40 mL Intracatheter Q12H  . sodium chloride flush  3 mL Intravenous Once    have reviewed scheduled and prn medications.  Physical Exam: General: agitated-  Heart: tachy Lungs: mostly clear Abdomen: soft, non tender Extremities: mild pitting edema  Dialysis Access: right AVF patent- also with right IJ temp HD cath for CRRT  - placed 5/12-    12/26/2018,8:08 AM  LOS: 6 days

## 2018-12-26 NOTE — Progress Notes (Addendum)
Called to room due to patient desating and with increase WOB after attempt to bathe patient. RN is adjusting sedation to better sedate patient. Upon arrival, patient labored, accessory muscle usage, RR in the 40's, SATs 70's, HR 140's. Lungs have coarse crackles throughout all lung fields. Suctioned patient obtaining a small amount of pink tinged frothy secretions. Recruitment maneuver perfomed, however, SATs continued to drop into the low 60's. Bagged patient with no improvement. Patient finally given a paralytic, in which SATs did come up to the upper 60's. Elink MD advised RT to place patient back on ventilator and increase the PEEP to 16, VT 6cc, and a RR 20. ABG obtained. RR increased to 26. Recruitment maneuvers performed every 5 minutes with SATs only responding 2 or 3% increase each time. BP remained stable throughout maneuvers. CCM arrived when SATs were mid 56's. MD performed a recruitment and then made several adjustments on the ventilator. Patient repositioned in the bed with the right lung down. RT, MD, and RN at bedside until SATs up to 88%. MD ordered for repeat ABG at 0715, one hour from placing patient on PCV.

## 2018-12-26 NOTE — Progress Notes (Signed)
Chest x-ray reviewed  Line okay to use

## 2018-12-27 ENCOUNTER — Inpatient Hospital Stay (HOSPITAL_COMMUNITY): Payer: Medicare Other

## 2018-12-27 DIAGNOSIS — I959 Hypotension, unspecified: Secondary | ICD-10-CM

## 2018-12-27 LAB — RENAL FUNCTION PANEL
Albumin: 1.6 g/dL — ABNORMAL LOW (ref 3.5–5.0)
Albumin: 1.7 g/dL — ABNORMAL LOW (ref 3.5–5.0)
Anion gap: 12 (ref 5–15)
Anion gap: 11 (ref 5–15)
BUN: 87 mg/dL — ABNORMAL HIGH (ref 6–20)
BUN: 77 mg/dL — ABNORMAL HIGH (ref 6–20)
CO2: 22 mmol/L (ref 22–32)
CO2: 23 mmol/L (ref 22–32)
Calcium: 8.8 mg/dL — ABNORMAL LOW (ref 8.9–10.3)
Calcium: 8.8 mg/dL — ABNORMAL LOW (ref 8.9–10.3)
Chloride: 104 mmol/L (ref 98–111)
Chloride: 104 mmol/L (ref 98–111)
Creatinine, Ser: 4.18 mg/dL — ABNORMAL HIGH (ref 0.61–1.24)
Creatinine, Ser: 3.63 mg/dL — ABNORMAL HIGH (ref 0.61–1.24)
GFR calc Af Amer: 17 mL/min — ABNORMAL LOW (ref >60)
GFR calc Af Amer: 21 mL/min — ABNORMAL LOW (ref >60)
GFR calc non Af Amer: 15 mL/min — ABNORMAL LOW (ref >60)
GFR calc non Af Amer: 18 mL/min — ABNORMAL LOW (ref >60)
Glucose, Bld: 130 mg/dL — ABNORMAL HIGH (ref 70–99)
Glucose, Bld: 147 mg/dL — ABNORMAL HIGH (ref 70–99)
Phosphorus: 4.4 mg/dL (ref 2.5–4.6)
Phosphorus: 5.1 mg/dL — ABNORMAL HIGH (ref 2.5–4.6)
Potassium: 4.4 mmol/L (ref 3.5–5.1)
Potassium: 4.6 mmol/L (ref 3.5–5.1)
Sodium: 138 mmol/L (ref 135–145)
Sodium: 138 mmol/L (ref 135–145)

## 2018-12-27 LAB — MAGNESIUM: Magnesium: 2.5 mg/dL — ABNORMAL HIGH (ref 1.7–2.4)

## 2018-12-27 LAB — POCT ACTIVATED CLOTTING TIME
Activated Clotting Time: 142 seconds
Activated Clotting Time: 142 seconds
Activated Clotting Time: 147 seconds
Activated Clotting Time: 136 seconds
Activated Clotting Time: 169 seconds
Activated Clotting Time: 175 seconds
Activated Clotting Time: 180 seconds
Activated Clotting Time: 175 seconds
Activated Clotting Time: 186 seconds
Activated Clotting Time: 164 seconds
Activated Clotting Time: 175 seconds

## 2018-12-27 LAB — GLUCOSE, CAPILLARY
Glucose-Capillary: 105 mg/dL — ABNORMAL HIGH (ref 70–99)
Glucose-Capillary: 95 mg/dL (ref 70–99)
Glucose-Capillary: 125 mg/dL — ABNORMAL HIGH (ref 70–99)
Glucose-Capillary: 96 mg/dL (ref 70–99)
Glucose-Capillary: 122 mg/dL — ABNORMAL HIGH (ref 70–99)
Glucose-Capillary: 124 mg/dL — ABNORMAL HIGH (ref 70–99)

## 2018-12-27 LAB — CBC
HCT: 20 % — ABNORMAL LOW (ref 39.0–52.0)
Hemoglobin: 6.8 g/dL — CL (ref 13.0–17.0)
MCH: 30.5 pg (ref 26.0–34.0)
MCHC: 34 g/dL (ref 30.0–36.0)
MCV: 89.7 fL (ref 80.0–100.0)
Platelets: 135 10*3/uL — ABNORMAL LOW (ref 150–400)
RBC: 2.23 MIL/uL — ABNORMAL LOW (ref 4.22–5.81)
RDW: 17.1 % — ABNORMAL HIGH (ref 11.5–15.5)
WBC: 29.9 10*3/uL — ABNORMAL HIGH (ref 4.0–10.5)
nRBC: 0.2 % (ref 0.0–0.2)

## 2018-12-27 LAB — PREPARE RBC (CROSSMATCH)

## 2018-12-27 LAB — HEMOGLOBIN AND HEMATOCRIT, BLOOD
HCT: 24.3 % — ABNORMAL LOW (ref 39.0–52.0)
Hemoglobin: 8 g/dL — ABNORMAL LOW (ref 13.0–17.0)

## 2018-12-27 MED ORDER — SODIUM CHLORIDE 0.9 % IV SOLN
250.0000 [IU]/h | INTRAVENOUS | Status: DC
  Administered 2018-12-27: 1000 [IU]/h via INTRAVENOUS_CENTRAL
  Administered 2018-12-28 (×2): 1750 [IU]/h via INTRAVENOUS_CENTRAL
  Filled 2018-12-27 (×19): qty 2

## 2018-12-27 MED ORDER — HEPARIN (PORCINE) 2000 UNITS/L FOR CRRT
INTRAVENOUS_CENTRAL | Status: DC | PRN
  Filled 2018-12-27: qty 1000

## 2018-12-27 MED ORDER — HEPARIN SODIUM (PORCINE) 1000 UNIT/ML DIALYSIS
1000.0000 [IU] | INTRAMUSCULAR | Status: DC | PRN
  Filled 2018-12-27: qty 6

## 2018-12-27 MED ORDER — HEPARIN BOLUS VIA INFUSION (CRRT)
1000.0000 [IU] | INTRAVENOUS | Status: DC | PRN
  Administered 2018-12-27 (×3): 1000 [IU] via INTRAVENOUS_CENTRAL
  Filled 2018-12-27: qty 1000

## 2018-12-27 MED ORDER — SODIUM CHLORIDE 0.9% IV SOLUTION
Freq: Once | INTRAVENOUS | Status: AC
  Administered 2018-12-27: 13:00:00 via INTRAVENOUS

## 2018-12-27 NOTE — Progress Notes (Signed)
STROKE TEAM PROGRESS NOTE   INTERVAL HISTORY Patient remains intubated and on precedex and fentanyl as well as levophed. Continued on bedside CRRT. Pt more awake alert than yesterday and able to open eyes on voice and follows some simple commands at right hand and arm. Hb down to 6.8, CCM on board and receiving PRBC.   Vitals:   12/27/18 0645 12/27/18 0700 12/27/18 0750 12/27/18 0800  BP: 119/81 114/80  (!) 89/71  Pulse: 92 89  (!) 114  Resp: 18 18  16   Temp:    97.9 F (36.6 C)  TempSrc:    Axillary  SpO2: 100% 100% 100% 100%  Weight:      Height:        CBC:  Recent Labs  Lab 12/13/2018 1139  12/25/18 0500  12/26/18 0551 12/26/18 0738  WBC 20.2*   < > 22.9*  --   --  34.8*  NEUTROABS 17.7*  --   --   --   --   --   HGB 11.0*   < > 6.9*   < > 9.2* 8.9*  8.2*  HCT 31.6*   < > 19.9*   < > 27.0* 26.7*  24.0*  MCV 92.1   < > 91.7  --   --  92.7  PLT 75*   < > 121*  --   --  183   < > = values in this interval not displayed.    Basic Metabolic Panel:  Recent Labs  Lab 12/26/18 0738 12/26/18 1655 12/27/18 0500  NA 138  138 138 138  K 4.6  4.8 4.5 4.4  CL 100 103 104  CO2 20* 21* 22  GLUCOSE 177* 167* 130*  BUN 131* 107* 87*  CREATININE 6.87* 5.35* 4.18*  CALCIUM 9.4 8.8* 8.8*  MG 2.9*  --  2.5*  PHOS 5.9* 3.9 4.4   Ct Angio Head W Or Wo Contrast  Result Date: 01/01/2019 CLINICAL DATA:  Left-sided weakness, facial droop, and slurred speech. EXAM: CT ANGIOGRAPHY HEAD AND NECK CT PERFUSION BRAIN TECHNIQUE: Multidetector CT imaging of the head and neck was performed using the standard protocol during bolus administration of intravenous contrast. Multiplanar CT image reconstructions and MIPs were obtained to evaluate the vascular anatomy. Carotid stenosis measurements (when applicable) are obtained utilizing NASCET criteria, using the distal internal carotid diameter as the denominator. Multiphase CT imaging of the brain was performed following IV bolus contrast  injection. Subsequent parametric perfusion maps were calculated using RAPID software. CONTRAST:  126mL OMNIPAQUE IOHEXOL 350 MG/ML SOLN COMPARISON:  None. FINDINGS: CTA NECK FINDINGS The study is motion degraded including moderate to severe motion artifact through the larynx which limits assessment of the carotid bifurcations. Aortic arch: Standard 3 vessel aortic arch with mild atherosclerotic plaque. No significant arch vessel origin stenosis. Right carotid system: Patent with moderate calcified atherosclerosis about the carotid bifurcation. No evidence of dissection or significant stenosis within limitations of motion artifact. Left carotid system: Patent with mild calcified atherosclerosis about the carotid bifurcation without evidence of significant stenosis or dissection. Vertebral arteries: Patent with the left being mildly dominant. Calcified atherosclerosis at the vertebral artery origins results in moderate to severe stenosis bilaterally. Skeleton: Mild cervical disc degeneration. 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 Upper chest: Clear lung apices. Review of the MIP images confirms the above findings CTA HEAD FINDINGS Anterior circulation: The internal carotid arteries are patent from skull base to carotid termini with calcified atherosclerosis resulting  in mild cavernous stenosis on the right. The right M1 segment is widely patent. Branch vessel assessment is limited by motion artifact, however there is a suspected mid right M2 branch vessel occlusion. The left MCA appears duplicated with suggestion of severe stenosis of the more superior origin although motion artifact may be contributing to this appearance. ACAs are patent without evidence of flow limiting proximal stenosis. No aneurysm is identified. Posterior circulation: The intracranial vertebral arteries are patent to the basilar without evidence of significant stenosis. P ICAs and a ICAs are not well evaluated.  SCA is are patent. The basilar artery is widely patent. There is a small right posterior communicating artery. PCAs are patent without evidence of flow limiting proximal stenosis allowing for suspected artifactual narrowing in the proximal right P2 segment. No aneurysm is identified. Venous sinuses: Patent. Anatomic variants: None. Review of the MIP images confirms the above findings CT Brain Perfusion Findings: ASPECTS: 10 CBF (<30%) Volume: 52mL Perfusion (Tmax>6.0s) volume: 74mL Mismatch Volume: 67mL Infarction Location: No core infarct by standard RAPID criteria. Penumbra anterolaterally in the right parietal lobe. IMPRESSION: 1. Motion degraded examination with limited assessment of the small and medium-sized intracranial arteries. 2. Suspected mid right M2 branch vessel occlusion with 9 mL penumbra in the right parietal lobe and no core infarct based on CTP. 3. No more proximal large vessel occlusion. 4. Patent cervical carotid arteries without evidence of significant stenosis. 5. 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 6. Possible severe left MCA origin stenosis. 7.  Aortic Atherosclerosis (ICD10-I70.0). These results were called by telephone at the time of interpretation on 12/11/2018 at 12:29 pm to Dr. Kerney Elbe , who verbally acknowledged these results. Electronically Signed   By: Logan Bores M.D.   On: 12/13/2018 12:59   Ct Angio Neck W Or Wo Contrast  Result Date: 01/02/2019 CLINICAL DATA:  Left-sided weakness, facial droop, and slurred speech. EXAM: CT ANGIOGRAPHY HEAD AND NECK CT PERFUSION BRAIN TECHNIQUE: Multidetector CT imaging of the head and neck was performed using the standard protocol during bolus administration of intravenous contrast. Multiplanar CT image reconstructions and MIPs were obtained to evaluate the vascular anatomy. Carotid stenosis measurements (when applicable) are obtained utilizing NASCET criteria, using the distal internal carotid  diameter as the denominator. Multiphase CT imaging of the brain was performed following IV bolus contrast injection. Subsequent parametric perfusion maps were calculated using RAPID software. CONTRAST:  159mL OMNIPAQUE IOHEXOL 350 MG/ML SOLN COMPARISON:  None. FINDINGS: CTA NECK FINDINGS The study is motion degraded including moderate to severe motion artifact through the larynx which limits assessment of the carotid bifurcations. Aortic arch: Standard 3 vessel aortic arch with mild atherosclerotic plaque. No significant arch vessel origin stenosis. Right carotid system: Patent with moderate calcified atherosclerosis about the carotid bifurcation. No evidence of dissection or significant stenosis within limitations of motion artifact. Left carotid system: Patent with mild calcified atherosclerosis about the carotid bifurcation without evidence of significant stenosis or dissection. Vertebral arteries: Patent with the left being mildly dominant. Calcified atherosclerosis at the vertebral artery origins results in moderate to severe stenosis bilaterally. Skeleton: Mild cervical disc degeneration. 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 Upper chest: Clear lung apices. Review of the MIP images confirms the above findings CTA HEAD FINDINGS Anterior circulation: The internal carotid arteries are patent from skull base to carotid termini with calcified atherosclerosis resulting in mild cavernous stenosis on the right. The right M1 segment is widely patent.  Branch vessel assessment is limited by motion artifact, however there is a suspected mid right M2 branch vessel occlusion. The left MCA appears duplicated with suggestion of severe stenosis of the more superior origin although motion artifact may be contributing to this appearance. ACAs are patent without evidence of flow limiting proximal stenosis. No aneurysm is identified. Posterior circulation: The intracranial vertebral arteries are  patent to the basilar without evidence of significant stenosis. P ICAs and a ICAs are not well evaluated. SCA is are patent. The basilar artery is widely patent. There is a small right posterior communicating artery. PCAs are patent without evidence of flow limiting proximal stenosis allowing for suspected artifactual narrowing in the proximal right P2 segment. No aneurysm is identified. Venous sinuses: Patent. Anatomic variants: None. Review of the MIP images confirms the above findings CT Brain Perfusion Findings: ASPECTS: 10 CBF (<30%) Volume: 18mL Perfusion (Tmax>6.0s) volume: 38mL Mismatch Volume: 16mL Infarction Location: No core infarct by standard RAPID criteria. Penumbra anterolaterally in the right parietal lobe. IMPRESSION: 1. Motion degraded examination with limited assessment of the small and medium-sized intracranial arteries. 2. Suspected mid right M2 branch vessel occlusion with 9 mL penumbra in the right parietal lobe and no core infarct based on CTP. 3. No more proximal large vessel occlusion. 4. Patent cervical carotid arteries without evidence of significant stenosis. 5. 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 6. Possible severe left MCA origin stenosis. 7.  Aortic Atherosclerosis (ICD10-I70.0). These results were called by telephone at the time of interpretation on 01/10/2019 at 12:29 pm to Dr. Kerney Elbe , who verbally acknowledged these results. Electronically Signed   By: Logan Bores M.D.   On: 12/30/2018 12:59   Mr Brain Wo Contrast  Result Date: 12/21/2018 CLINICAL DATA:  Stroke follow-up EXAM: MRI HEAD WITHOUT CONTRAST TECHNIQUE: Multiplanar, multiecho pulse sequences of the brain and surrounding structures were obtained without intravenous contrast. COMPARISON:  CTA head neck 01/01/2019 FINDINGS: BRAIN: Multifocal abnormal diffusion restriction within both cerebellar hemispheres and scattered throughout multiple bilateral supratentorial territories.  The largest area of ischemia is in the right frontal operculum and insula. The midline structures are normal. No midline shift or other mass effect. Early confluent hyperintense T2-weighted signal of the periventricular and deep white matter, most commonly due to chronic ischemic microangiopathy. Generalized atrophy without lobar predilection. Multiple scattered foci of microhemorrhage, many of which correspond to areas of ischemia. No mass lesion. VASCULAR: Abnormal flow void within the right transverse sinus. The arterial flow voids are preserved. SKULL AND UPPER CERVICAL SPINE: Calvarial bone marrow signal is normal. There is no skull base mass. Visualized upper cervical spine and soft tissues are normal. SINUSES/ORBITS: Fluid level in the maxillary sinuses. The orbits are normal. IMPRESSION: 1. Multifocal acute ischemia scattered throughout both cerebral and cerebellar hemispheres, with the largest area located in the right frontal operculum and insula. 2. Multifocal petechiae/microhemorrhage at the sites of ischemic infarction. No intraparenchymal hematoma or mass effect. 3. Abnormal flow void in the right transverse sinus could indicate dural venous sinus thrombosis. However, on the earlier CTA and the interventional angiogram, the sinus was clearly patent. Electronically Signed   By: Ulyses Jarred M.D.   On: 12/21/2018 02:28   Ct Cerebral Perfusion W Contrast  Result Date: 12/18/2018 CLINICAL DATA:  Left-sided weakness, facial droop, and slurred speech. EXAM: CT ANGIOGRAPHY HEAD AND NECK CT PERFUSION BRAIN TECHNIQUE: Multidetector CT imaging of the head and neck was performed using the standard protocol during bolus administration  of intravenous contrast. Multiplanar CT image reconstructions and MIPs were obtained to evaluate the vascular anatomy. Carotid stenosis measurements (when applicable) are obtained utilizing NASCET criteria, using the distal internal carotid diameter as the denominator.  Multiphase CT imaging of the brain was performed following IV bolus contrast injection. Subsequent parametric perfusion maps were calculated using RAPID software. CONTRAST:  123mL OMNIPAQUE IOHEXOL 350 MG/ML SOLN COMPARISON:  None. FINDINGS: CTA NECK FINDINGS The study is motion degraded including moderate to severe motion artifact through the larynx which limits assessment of the carotid bifurcations. Aortic arch: Standard 3 vessel aortic arch with mild atherosclerotic plaque. No significant arch vessel origin stenosis. Right carotid system: Patent with moderate calcified atherosclerosis about the carotid bifurcation. No evidence of dissection or significant stenosis within limitations of motion artifact. Left carotid system: Patent with mild calcified atherosclerosis about the carotid bifurcation without evidence of significant stenosis or dissection. Vertebral arteries: Patent with the left being mildly dominant. Calcified atherosclerosis at the vertebral artery origins results in moderate to severe stenosis bilaterally. Skeleton: Mild cervical disc degeneration. 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 Upper chest: Clear lung apices. Review of the MIP images confirms the above findings CTA HEAD FINDINGS Anterior circulation: The internal carotid arteries are patent from skull base to carotid termini with calcified atherosclerosis resulting in mild cavernous stenosis on the right. The right M1 segment is widely patent. Branch vessel assessment is limited by motion artifact, however there is a suspected mid right M2 branch vessel occlusion. The left MCA appears duplicated with suggestion of severe stenosis of the more superior origin although motion artifact may be contributing to this appearance. ACAs are patent without evidence of flow limiting proximal stenosis. No aneurysm is identified. Posterior circulation: The intracranial vertebral arteries are patent to the basilar without  evidence of significant stenosis. P ICAs and a ICAs are not well evaluated. SCA is are patent. The basilar artery is widely patent. There is a small right posterior communicating artery. PCAs are patent without evidence of flow limiting proximal stenosis allowing for suspected artifactual narrowing in the proximal right P2 segment. No aneurysm is identified. Venous sinuses: Patent. Anatomic variants: None. Review of the MIP images confirms the above findings CT Brain Perfusion Findings: ASPECTS: 10 CBF (<30%) Volume: 37mL Perfusion (Tmax>6.0s) volume: 28mL Mismatch Volume: 45mL Infarction Location: No core infarct by standard RAPID criteria. Penumbra anterolaterally in the right parietal lobe. IMPRESSION: 1. Motion degraded examination with limited assessment of the small and medium-sized intracranial arteries. 2. Suspected mid right M2 branch vessel occlusion with 9 mL penumbra in the right parietal lobe and no core infarct based on CTP. 3. No more proximal large vessel occlusion. 4. Patent cervical carotid arteries without evidence of significant stenosis. 5. 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 6. Possible severe left MCA origin stenosis. 7.  Aortic Atherosclerosis (ICD10-I70.0). These results were called by telephone at the time of interpretation on 01/03/2019 at 12:29 pm to Dr. Kerney Elbe , who verbally acknowledged these results. Electronically Signed   By: Logan Bores M.D.   On: 12/14/2018 12:59   Dg Chest Port 1 View  Result Date: 12/27/2018 CLINICAL DATA:  Intubated EXAM: PORTABLE CHEST 1 VIEW COMPARISON:  One-view chest x-ray 12/26/2018 FINDINGS: Heart size is normal. Endotracheal tube terminates 5.5 cm above the carina, in satisfactory position. Left IJ line and right IJ sheath are stable. NG tube courses off the inferior border of the film. There is slight  improved aeration. Patchy interstitial and airspace disease is again noted bilaterally. IMPRESSION: 1.  Support apparatus stable. 2. Slight improved aeration of both lungs. 3. Persistent diffuse interstitial and airspace disease. Electronically Signed   By: San Morelle M.D.   On: 12/27/2018 08:30   Dg Chest Port 1 View  Result Date: 12/26/2018 CLINICAL DATA:  Encounter for central line placement. EXAM: PORTABLE CHEST 1 VIEW COMPARISON:  12/26/2018 FINDINGS: There is a right IJ catheter with tip in the projection of the SVC. Left IJ catheter has been recently placed and tip is projecting over the cavoatrial junction. ET tube tip is above the carina. There is an enteric tube with tip just below the GE junction. Normal heart size. Diffuse bilateral pulmonary opacities are again noted and appear unchanged from previous exam IMPRESSION: 1. Interval placement of a left IJ catheter with tip projecting over the cavoatrial junction. No pneumothorax identified. 2. No change in aeration to lungs compared with previous exam. Electronically Signed   By: Kerby Moors M.D.   On: 12/26/2018 18:05   Dg Chest Port 1 View  Result Date: 12/26/2018 CLINICAL DATA:  Respiratory failure EXAM: PORTABLE CHEST 1 VIEW COMPARISON:  12/25/2018 FINDINGS: Endotracheal tube terminates 2.5 cm above the carina. Right IJ venous catheter terminates in the mid SVC. Enteric tube terminates in the gastric cardia. Multifocal patchy opacities in the lungs bilaterally, left upper lobe predominant, progressive from the prior and suspicious for multifocal pneumonia. No definite pleural effusions. No pneumothorax. The heart is top-normal in size. IMPRESSION: Endotracheal tube terminates 2.5 cm above the carina. Multifocal pneumonia, mildly progressive. Electronically Signed   By: Julian Hy M.D.   On: 12/26/2018 06:54   Dg Chest Port 1 View  Result Date: 12/25/2018 CLINICAL DATA:  58 year old male with cerebral infarcts, sepsis, bacteremia. EXAM: PORTABLE CHEST 1 VIEW COMPARISON:  12/24/2018 and earlier. FINDINGS: Portable AP semi  upright view at 0644 hours. Stable endotracheal tube and right IJ central line. Enteric tube tip at the level of the gastric body, the side hole is not clearly identified. Improved ventilation since yesterday with decreasing indistinct perihilar opacity. Mildly larger lung volumes. Stable cardiac size and mediastinal contours. No pneumothorax, pleural effusion or areas of worsening ventilation. Stable visible bowel gas pattern. IMPRESSION: 1. Stable lines and tubes. 2. Improved ventilation since yesterday with decreasing indistinct perihilar opacity. 3. No new cardiopulmonary abnormality. Electronically Signed   By: Genevie Ann M.D.   On: 12/25/2018 09:21   Dg Chest Port 1 View  Result Date: 12/24/2018 CLINICAL DATA:  Acute respiratory failure. EXAM: PORTABLE CHEST 1 VIEW COMPARISON:  Yesterday. FINDINGS: Endotracheal tube, enteric tube, and right central line remain in place. Persistent low lung volumes. Ill-defined bilateral lung opacities, left greater than right, with slight progression from prior. Worsening retrocardiac and right basilar opacity with more confluent density. No large pleural effusion. No pneumothorax. IMPRESSION: Worsening bilateral perihilar lung opacities, left greater than right. This may be pulmonary edema, ARDS, or multifocal pneumonia. More confluent opacities at the bases is also progressed. Electronically Signed   By: Keith Rake M.D.   On: 12/24/2018 03:58   Dg Chest Port 1 View  Result Date: 12/23/2018 CLINICAL DATA:  58 year old male with cerebral infarcts, sepsis, bacteremia, respiratory failure. EXAM: PORTABLE CHEST 1 VIEW COMPARISON:  12/22/2018 and earlier. FINDINGS: Portable AP semi upright view at 0649 hours. Stable endotracheal tube tip between the level the clavicles and carina. Enteric tube courses to the abdomen, tip not included. Stable right IJ  central line. Mildly larger lung volumes and regressed retrocardiac opacity with air bronchograms. The left hemidiaphragm  is visible today. Mildly asymmetric increased left lung interstitial opacity is stable. No pneumothorax or new pulmonary opacity. Normal cardiac size and mediastinal contours. Negative visible bowel gas pattern. IMPRESSION: 1. Stable lines and tubes. 2. Mildly larger lung volumes and regressed left lower lobe collapse or consolidation. 3. Asymmetric interstitial opacity in the left lung persists, favor infection over asymmetric edema. 4. No new cardiopulmonary abnormality. Electronically Signed   By: Genevie Ann M.D.   On: 12/23/2018 08:38   Dg Chest Port 1 View  Result Date: 12/22/2018 CLINICAL DATA:  58 year old male with cerebral infarcts, sepsis, staph bacteremia, respiratory failure. EXAM: PORTABLE CHEST 1 VIEW COMPARISON:  12/21/2018 and earlier. FINDINGS: Portable AP semi upright view at 0528 hours. ET tube in good position between the clavicles and carina. Enteric tube courses to the abdomen, side hole is at the level of the GE junction. Stable right IJ central line. Left lower lobe collapse or consolidation with some air bronchograms. Additional patchy left perihilar and medial right lung base opacity. No pneumothorax or pulmonary edema. No definite pleural effusion. No acute osseous abnormality identified. IMPRESSION: 1. Enteric tube side hole at the level of the GE junction, advance 5 cm to ensure side hole placement within the stomach. 2.  Otherwise stable lines and tubes. 3. Stable ventilation with left lower lobe collapse or consolidation and patchy additional perihilar opacity. Electronically Signed   By: Genevie Ann M.D.   On: 12/22/2018 07:23   Dg Chest Port 1 View  Result Date: 12/21/2018 CLINICAL DATA:  Central line placement. EXAM: PORTABLE CHEST 1 VIEW COMPARISON:  12/19/2018 FINDINGS: Endotracheal tube with tip 3.1 cm above the carina. Interval advancement of the nasogastric tube which has tip just below the expected region of the gastroesophageal junction as this could be advanced another 5  cm. Right IJ central venous catheter has tip over the SVC. Lungs are adequately inflated demonstrate hazy opacification over the left lung which may be due to layering effusion with atelectasis. Subtle hazy opacification of the perihilar regions which may be due to mild interstitial edema. Cardiomediastinal silhouette and remainder of the exam is unchanged. IMPRESSION: Hazy opacification over the left lung which may be due to layering effusion with atelectasis. Mild hazy opacification of the perihilar regions which suggest mild interstitial edema. Tubes and lines as described. Electronically Signed   By: Marin Olp M.D.   On: 12/21/2018 11:39   Dg Chest Portable 1 View  Result Date: 12/21/2018 CLINICAL DATA:  Acute LEFT CVA. EXAM: PORTABLE CHEST 1 VIEW COMPARISON:  03/04/2016 radiographs FINDINGS: An endotracheal tube with tip 4 cm above the carina and OG tube with tip overlying the distal esophagus noted. UPPER limits normal heart size noted. There is no evidence of focal airspace disease, pulmonary edema, suspicious pulmonary nodule/mass, pleural effusion, or pneumothorax. No acute bony abnormalities are identified. IMPRESSION: Endotracheal tube with tip 4 cm above the carina. OG tube with tip overlying the distal esophagus-recommend advancement. No acute cardiopulmonary disease. Electronically Signed   By: Margarette Canada M.D.   On: 12/23/2018 14:45   Dg Abd Portable 1 View  Result Date: 12/30/2018 CLINICAL DATA:  OG tube placement. EXAM: PORTABLE ABDOMEN - 1 VIEW COMPARISON:  None. FINDINGS: An OG tube is identified with tip overlying the distal esophagus. Recommend advancement. The bowel gas pattern is unremarkable. IMPRESSION: OG tube with tip overlying the distal esophagus-recommend advancement. Electronically Signed  By: Margarette Canada M.D.   On: 01/05/2019 14:46   Ct Head Code Stroke Wo Contrast  Result Date: 12/21/2018 CLINICAL DATA:  Code stroke. Left-sided weakness, facial droop, and slurred  speech. EXAM: CT HEAD WITHOUT CONTRAST TECHNIQUE: Contiguous axial images were obtained from the base of the skull through the vertex without intravenous contrast. COMPARISON:  Head CT 09/17/2013 FINDINGS: Brain: There is no evidence of acute infarct, intracranial hemorrhage, mass, midline shift, or extra-axial fluid collection. Patchy cerebral white matter hypodensities are similar to the prior CT and nonspecific but compatible with moderately age advanced chronic small vessel ischemic disease. Mild prominence of the lateral ventricles is also unchanged and may reflect central predominant cerebral atrophy. Vascular: Calcified atherosclerosis at the skull base. No hyperdense vessel. Skull: No fracture or focal osseous lesion. Sinuses/Orbits: Mild mucosal thickening in the maxillary sinuses. Clear mastoid air cells. Left cataract extraction. Other: None. ASPECTS [facility] Stroke Program Early CT Score) - Ganglionic level infarction (caudate, lentiform nuclei, internal capsule, insula, M1-M3 cortex): 7 - Supraganglionic infarction (M4-M6 cortex): 3 Total score (0-10 with 10 being normal): 10 IMPRESSION: 1. No evidence of acute intracranial abnormality. 2. ASPECTS is 10. 3. Moderate chronic small vessel ischemic disease. These results were communicated to Dr. Cheral Marker at 12:09 pm on 12/27/2018 by text page via the [facility] messaging system. Electronically Signed   By: Logan Bores M.D.   On: 12/22/2018 12:37   Ir Angio Intra Extracran Sel Com Carotid Innominate Bilat Mod Sed  Result Date: 12/24/2018 CLINICAL DATA:  Left-sided facial droop, severe dysarthria, and left upper weakness. CT angiogram revealing occlusion of the right middle cerebral artery questionable M2 division. EXAM: IR ANGIO VERTEBRAL SEL SUBCLAVIAN INNOMINATE UNI RIGHT MOD SED; IR ANGIO VERTEBRAL SEL VERTEBRAL UNI LEFT MOD SED; BILATERAL COMMON CAROTID AND INNOMINATE ANGIOGRAPHY COMPARISON:  CT angiogram of the head and neck of 12/29/2018. MEDICATIONS:  Heparin none units IV; Ancef 2 g IV antibiotic was administered within 1 hour of the procedure. ANESTHESIA/SEDATION: General anesthesia. CONTRAST:  Isovue 300 approximately 50 mL. FLUOROSCOPY TIME:  Fluoroscopy Time: 5 minutes 0 seconds (712 mGy). COMPLICATIONS: None immediate. TECHNIQUE: Informed written consent was obtained from the patient's mother after a thorough discussion of the procedural risks, benefits and alternatives. All questions were addressed. Maximal Sterile Barrier Technique was utilized including caps, mask, sterile gowns, sterile gloves, sterile drape, hand hygiene and skin antiseptic. A timeout was performed prior to the initiation of the procedure. The right groin was prepped and draped in the usual sterile fashion. Thereafter using modified Seldinger technique, transfemoral access into the right common femoral artery was obtained without difficulty. Over a 0.035 inch guidewire, a 5 French Pinnacle sheath was inserted. Through this, and also over 0.035 inch guidewire, a 5 [place] JB 1 catheter was advanced to the aortic arch region and selectively positioned in the right common carotid artery, , the right subclavian artery, the left common carotid artery and the left vertebral artery. FINDINGS: The right subclavian arteriogram demonstrates hypoplastic right vertebral artery with a normal origin. The vessel is seen to opacify to the cranial skull base where it opacifies the right vertebrobasilar junction and the right posterior-inferior cerebellar artery. The opacified portion of the basilar artery is widely patent. The right common carotid arteriogram demonstrates the right external carotid artery and its major branches to be widely patent. The right internal carotid artery at the bulb to the cranial skull base demonstrates wide patency. The petrous, cavernous and the supraclinoid segments are widely patent. The  right middle cerebral artery and the right anterior cerebral artery opacify into the  capillary and venous phases. There is a tapered severe stenosis followed by occlusion of a mid perisylvian branch of the superior division of the right middle cerebral artery in the M3 region. Extensive collaterals are seen retrogradely opacifying this distribution. Dural venous sinuses are widely patent. The left common carotid arteriogram demonstrates the left external carotid artery and its major branches to be widely patent. The left internal carotid artery at the bulb to the cranial skull base demonstrates wide patency. The petrous, cavernous and the supraclinoid segments are widely patent. The left middle cerebral artery demonstrates an early bifurcation a developmental variation, the left anterior cerebral artery opacifies with the left middle cerebral artery into the capillary and the venous phases. The left vertebral artery origin is widely patent. The vessel opacifies to the cranial skull base. Wide patency is seen of the left vertebrobasilar junction and the left posterior-inferior cerebellar artery which is hypoplastic. Suggestion of a left anterior-inferior cerebellar artery/posterior-inferior cerebellar artery complex is seen. The opacified portion of the basilar artery, the posterior cerebral arteries, the superior cerebellar arteries and the anterior-inferior cerebellar arteries is noted into the capillary and venous phases. Unopacified blood is seen in the basilar artery from the contralateral vertebral artery. 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 PLAN: Follow-up with the referring neurologist. Electronically Signed   By: Luanne Bras M.D.   On: 12/21/2018 13:24   Ir Angio Vertebral Sel Subclavian Innominate Uni R Mod Sed  Result Date: 12/24/2018 CLINICAL DATA:  Left-sided facial droop, severe dysarthria, and left upper weakness. CT angiogram revealing occlusion of  the right middle cerebral artery questionable M2 division. EXAM: IR ANGIO VERTEBRAL SEL SUBCLAVIAN INNOMINATE UNI RIGHT MOD SED; IR ANGIO VERTEBRAL SEL VERTEBRAL UNI LEFT MOD SED; BILATERAL COMMON CAROTID AND INNOMINATE ANGIOGRAPHY COMPARISON:  CT angiogram of the head and neck of 12/25/2018. MEDICATIONS: Heparin none units IV; Ancef 2 g IV antibiotic was administered within 1 hour of the procedure. ANESTHESIA/SEDATION: General anesthesia. CONTRAST:  Isovue 300 approximately 50 mL. FLUOROSCOPY TIME:  Fluoroscopy Time: 5 minutes 0 seconds (712 mGy). COMPLICATIONS: None immediate. TECHNIQUE: Informed written consent was obtained from the patient's mother after a thorough discussion of the procedural risks, benefits and alternatives. All questions were addressed. Maximal Sterile Barrier Technique was utilized including caps, mask, sterile gowns, sterile gloves, sterile drape, hand hygiene and skin antiseptic. A timeout was performed prior to the initiation of the procedure. The right groin was prepped and draped in the usual sterile fashion. Thereafter using modified Seldinger technique, transfemoral access into the right common femoral artery was obtained without difficulty. Over a 0.035 inch guidewire, a 5 French Pinnacle sheath was inserted. Through this, and also over 0.035 inch guidewire, a 5 [place] JB 1 catheter was advanced to the aortic arch region and selectively positioned in the right common carotid artery, , the right subclavian artery, the left common carotid artery and the left vertebral artery. FINDINGS: The right subclavian arteriogram demonstrates hypoplastic right vertebral artery with a normal origin. The vessel is seen to opacify to the cranial skull base where it opacifies the right vertebrobasilar junction and the right posterior-inferior cerebellar artery. The opacified portion of the basilar artery is widely patent. The right common carotid arteriogram demonstrates the right external carotid  artery and its major branches to be widely patent. The right internal  carotid artery at the bulb to the cranial skull base demonstrates wide patency. The petrous, cavernous and the supraclinoid segments are widely patent. The right middle cerebral artery and the right anterior cerebral artery opacify into the capillary and venous phases. There is a tapered severe stenosis followed by occlusion of a mid perisylvian branch of the superior division of the right middle cerebral artery in the M3 region. Extensive collaterals are seen retrogradely opacifying this distribution. Dural venous sinuses are widely patent. The left common carotid arteriogram demonstrates the left external carotid artery and its major branches to be widely patent. The left internal carotid artery at the bulb to the cranial skull base demonstrates wide patency. The petrous, cavernous and the supraclinoid segments are widely patent. The left middle cerebral artery demonstrates an early bifurcation a developmental variation, the left anterior cerebral artery opacifies with the left middle cerebral artery into the capillary and the venous phases. The left vertebral artery origin is widely patent. The vessel opacifies to the cranial skull base. Wide patency is seen of the left vertebrobasilar junction and the left posterior-inferior cerebellar artery which is hypoplastic. Suggestion of a left anterior-inferior cerebellar artery/posterior-inferior cerebellar artery complex is seen. The opacified portion of the basilar artery, the posterior cerebral arteries, the superior cerebellar arteries and the anterior-inferior cerebellar arteries is noted into the capillary and venous phases. Unopacified blood is seen in the basilar artery from the contralateral vertebral artery. 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 PLAN: Follow-up with the  referring neurologist. Electronically Signed   By: Luanne Bras M.D.   On: 12/21/2018 13:24   Ir Angio Vertebral Sel Vertebral Uni L Mod Sed  Result Date: 12/24/2018 CLINICAL DATA:  Left-sided facial droop, severe dysarthria, and left upper weakness. CT angiogram revealing occlusion of the right middle cerebral artery questionable M2 division. EXAM: IR ANGIO VERTEBRAL SEL SUBCLAVIAN INNOMINATE UNI RIGHT MOD SED; IR ANGIO VERTEBRAL SEL VERTEBRAL UNI LEFT MOD SED; BILATERAL COMMON CAROTID AND INNOMINATE ANGIOGRAPHY COMPARISON:  CT angiogram of the head and neck of 12/25/2018. MEDICATIONS: Heparin none units IV; Ancef 2 g IV antibiotic was administered within 1 hour of the procedure. ANESTHESIA/SEDATION: General anesthesia. CONTRAST:  Isovue 300 approximately 50 mL. FLUOROSCOPY TIME:  Fluoroscopy Time: 5 minutes 0 seconds (712 mGy). COMPLICATIONS: None immediate. TECHNIQUE: Informed written consent was obtained from the patient's mother after a thorough discussion of the procedural risks, benefits and alternatives. All questions were addressed. Maximal Sterile Barrier Technique was utilized including caps, mask, sterile gowns, sterile gloves, sterile drape, hand hygiene and skin antiseptic. A timeout was performed prior to the initiation of the procedure. The right groin was prepped and draped in the usual sterile fashion. Thereafter using modified Seldinger technique, transfemoral access into the right common femoral artery was obtained without difficulty. Over a 0.035 inch guidewire, a 5 French Pinnacle sheath was inserted. Through this, and also over 0.035 inch guidewire, a 5 [place] JB 1 catheter was advanced to the aortic arch region and selectively positioned in the right common carotid artery, , the right subclavian artery, the left common carotid artery and the left vertebral artery. FINDINGS: The right subclavian arteriogram demonstrates hypoplastic right vertebral artery with a normal origin. The  vessel is seen to opacify to the cranial skull base where it opacifies the right vertebrobasilar junction and the right posterior-inferior cerebellar artery. The opacified portion of the basilar artery is widely patent.  The right common carotid arteriogram demonstrates the right external carotid artery and its major branches to be widely patent. The right internal carotid artery at the bulb to the cranial skull base demonstrates wide patency. The petrous, cavernous and the supraclinoid segments are widely patent. The right middle cerebral artery and the right anterior cerebral artery opacify into the capillary and venous phases. There is a tapered severe stenosis followed by occlusion of a mid perisylvian branch of the superior division of the right middle cerebral artery in the M3 region. Extensive collaterals are seen retrogradely opacifying this distribution. Dural venous sinuses are widely patent. The left common carotid arteriogram demonstrates the left external carotid artery and its major branches to be widely patent. The left internal carotid artery at the bulb to the cranial skull base demonstrates wide patency. The petrous, cavernous and the supraclinoid segments are widely patent. The left middle cerebral artery demonstrates an early bifurcation a developmental variation, the left anterior cerebral artery opacifies with the left middle cerebral artery into the capillary and the venous phases. The left vertebral artery origin is widely patent. The vessel opacifies to the cranial skull base. Wide patency is seen of the left vertebrobasilar junction and the left posterior-inferior cerebellar artery which is hypoplastic. Suggestion of a left anterior-inferior cerebellar artery/posterior-inferior cerebellar artery complex is seen. The opacified portion of the basilar artery, the posterior cerebral arteries, the superior cerebellar arteries and the anterior-inferior cerebellar arteries is noted into the  capillary and venous phases. Unopacified blood is seen in the basilar artery from the contralateral vertebral artery. 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 PLAN: Follow-up with the referring neurologist. Electronically Signed   By: Luanne Bras M.D.   On: 12/21/2018 13:24     PHYSICAL EXAM:     Temp:  [93.7 F (34.3 C)-99.3 F (37.4 C)] 97.9 F (36.6 C) (05/17 0800) Pulse Rate:  [87-137] 114 (05/17 0800) Resp:  [5-24] 16 (05/17 0800) BP: (79-154)/(51-107) 89/71 (05/17 0800) SpO2:  [100 %] 100 % (05/17 0800) FiO2 (%):  [40 %-90 %] 40 % (05/17 0750) Weight:  [96 kg] 96 kg (05/17 0500)  General - Well nourished, well developed, intubated on sedation.  Ophthalmologic - fundi not visualized due to noncooperation.  Cardiovascular - Regular rate and rhythm, intermittent tachycardia.  Neuro - intubated on sedation, eyes open on voice, following some simple commands with eye opening close, and right hand showing fingers. Eyes in mid position, fixed, no lateral movement on request, no tracking, not blinking to visual threat, pupil pinpoint bilaterally slightly improved from yesterday. Corneal reflex positive bilaterally, gag and cough absent. Breathing over the vent.  Facial symmetry not able to test due to ET tube.  Tongue midline in mouth.  Right arm 4/5 against gravity, able to follow commands with right fingers.  On pain stimulation, no movement in LUE and BLEs. DTR  diminished and no babinski. Sensation, coordination and gait not tested.    ASSESSMENT/PLAN [name] is a 58 y.o. male with history of atrial fibrillation not on AC, HTN and ESRD on HD presenting with left hemiparesis and left facial droop with dysarthria. Sent to IR but no correctable lesion.  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 Small vessel disease. ASPECTS 10.     CTA head &  neck suspected mid R M2 branch occlusion w/ 46mL penumbra R parietal. No LVO. Possible severe L MCA origin stenosis. Aortic atherosclerosis   CT perfusion no core  Cerebral angio RT MCA   Sup division M3 branch tapered occlusion. No intervention  MRI  Multifocal B supratentorial infarcts.   2D Echo EF 65%. There are severe posterior mitral annular calcifications extending into the mitral lefalets with a mobile portion that most probably represents a large calcified mass partially detached from the leaflet. This can possibly be a source of embolism and stroke.  HIV negative   TEE not indicated per cardiology as meeting dx criteria for endocarditis  LDL UTC d/t high TG 691  HgbA1c 5.5  SCDs for VTE prophylaxis. Hold Heparin given severe anemia   No antithrombotic prior to admission, now on aspirin 81 mg daily. Not an annticoagulation candidate due to endocarditis, risk of bleeding and severe anemia at this time. Consider AC if appropriate after completing 6 wk abx course. Discontinue aspirin 81 in setting of severe anemia.   Therapy recommendations:  pending   Disposition:  pending   Acute Hypoxic Respiratory Failure, compromised airway  Intubated initially for IR  CXR decreasing perihilar opacity.  Weaning but hypoxic tachycardic.   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   Repeat Blood cultures - NG x 3 days   UCx no  growth   Lactic acid 3.3  ID on board  Cardiology signed off  CVTS Dr. Roxy Manns will formally consult on Monday  Atrial Fibrillation w/ RVR . Home anticoagulation:  none  . On Amiodarone   . HR 90-120s . Cardiology signed off . Not on metoprolol given hypotension . Not an AC candidate d/t endocarditis and risk for bleeding - consider in future after abx treatment is completed in 6 weeks.     Hypotension in setting of sepsis  Hypertensive Emergency on admission   BP as high as 174/139 on admission  Home meds:  Norvasc 10, hydralazine 25 tid, labetalol 100 bid, lisinopril 40  BP stable now but 87/58 at midnight -> 89/71 this AM  On levophed  . BP goal normotensive  Severe anemia   Hb 9.0-8.8-7.7-6.9-> 1u PRBC->8.2->8.9->6.8-> PRBC -> 8.0  Likely due to ESRD vs. Acute blood loss  Not candidate for CT A/P due to CRRT in place  Close monitoring  Hyperlipidemia  Home meds:  No statin  LDL UTC d/t high TG 691, goal < 70  Recheck lipids once off lipid based drips  Consider statin if LDL remains elevated at that time  Dysphagia  On vent  TF per CCM  Other Stroke Risk Factors  Overweight, Body mass index is 31.25 kg/m., recommend weight loss, diet and exercise as appropriate   Other Active Problems  ESRD on HD TTS. Nephrology on board. Continued on CRRT  Secondary hyperparathyroidism   Thrombocytopenia in setting of sepsis [id number]->183   INR 1.7->1.5  Hospital day # 7   This patient is critically ill and at significant risk of neurological worsening, death and care requires constant monitoring of  vital signs, hemodynamics, respiratory and cardiac monitoring, extensive review of multiple databases, frequent neurological assessment, discussion with CCM team and medical decision making of high complexity. I spent 35 minutes of neurocritical care time  in the care of  this patient.  Rosalin Hawking, MD PhD Stroke Neurology 12/27/2018 5:52 PM   To contact  Stroke Continuity provider, please refer to [email or web address] After hours, contact General Neurology

## 2018-12-27 NOTE — Progress Notes (Signed)
Subjective:  Continuing to not do well- cont on pressors- mod dose with intermittent Afib and agitation  CRRT initially clotted - having to change about once a shift per nursing   Objective Vital signs in last 24 hours: Vitals:   12/27/18 0645 12/27/18 0700 12/27/18 0750 12/27/18 0800  BP: 119/81 114/80  (!) 89/71  Pulse: 92 89  (!) 114  Resp: '18 18  16  ' Temp:      TempSrc:      SpO2: 100% 100% 100% 100%  Weight:      Height:       Weight change:   Intake/Output Summary (Last 24 hours) at 12/27/2018 9833 Last data filed at 12/27/2018 0800 Gross per 24 hour  Intake 3451.04 ml  Output 3549 ml  Net -97.96 ml   HD orders- [facility]- 4 hours 450 BFR- left AVF - EDW 88 2/2 heparin - yes  parsabiv 5, calcitriol 1.5 Last hgb 10.9- calc 9.0- phos 6.9- pth 594   Assessment/ Plan: Pt is a 58 y.o. yo male who was admitted on 12/17/2018 with an acute ischemic stroke , then found to have an MSSA bacteremia   Assessment/Plan: 1. Acute CVA- supportive care- c/w  septic emboli-  2. MSSA bacteremia- on nafcillin - ID following- 6 weeks total abx for presumed IE, WBC still quite high -   AVF not felt to be source of infection.  ID says can be changed to vanc at discharge so abx can be given at OP center.  CTS to consult now 3. ESRD-  Normally TTS  - CRRT from 5/12 to 5/14-  more unstable again-  resumption of CRRT on 5/16- all 4 K bath  4. Secondary hyperparathyroidism- calcium is OK - Phos is OK - no binder  5. HTN/volume- volume and BP seem OK- no BP meds- since CRRT stopped, quite a bit positive-  some volume removal with CRRT - now will run even  6. Anemia- hgb trending down - also platelets down- stopped heparin thru CRRT - on Aranesp.  Given one unit on 5/15 as well.  Now with clotting needing to resume some heparin thru machine   7. VDRF-  Has taken a step back for unclear reasons, cont excellent care per CCM  [facility] A Ceonna Frazzini    Labs: Basic Metabolic Panel: Recent Labs  Lab  12/26/18 0738 12/26/18 1655 12/27/18 0500  NA 138  138 138 138  K 4.6  4.8 4.5 4.4  CL 100 103 104  CO2 20* 21* 22  GLUCOSE 177* 167* 130*  BUN 131* 107* 87*  CREATININE 6.87* 5.35* 4.18*  CALCIUM 9.4 8.8* 8.8*  PHOS 5.9* 3.9 4.4   Liver Function Tests: Recent Labs  Lab 12/26/2018 1139  12/26/18 0738 12/26/18 1655 12/27/18 0500  AST 53*  --   --   --   --   ALT 21  --   --   --   --   ALKPHOS 102  --   --   --   --   BILITOT 1.3*  --   --   --   --   PROT 6.8  --   --   --   --   ALBUMIN 3.3*   < > 1.7* 1.6* 1.6*   < > = values in this interval not displayed.   Recent Labs  Lab 12/22/18 0945  LIPASE 30   No results for input(s): AMMONIA in the last 168 hours. CBC: Recent Labs  Lab 01/07/2019 1139 12/22/18 0537 12/23/18 0524 12/24/18 0540 12/25/18 0500 12/25/18 1730 12/26/18 0551 12/26/18 0738  WBC 20.2* 15.4* 18.7* 22.9* 22.9*  --   --  34.8*  NEUTROABS 17.7*  --   --   --   --   --   --   --   HGB 11.0* 8.8* 9.0* 7.7* 6.9* 7.7* 9.2* 8.9*  8.2*  HCT 31.6* 25.7* 25.8* 22.6* 19.9* 22.6* 27.0* 26.7*  24.0*  MCV 92.1 93.1 91.5 92.6 91.7  --   --  92.7  PLT 75* 54* 67* 83* 121*  --   --  183   Cardiac Enzymes: No results for input(s): CKTOTAL, CKMB, CKMBINDEX, TROPONINI in the last 168 hours. CBG: Recent Labs  Lab 12/26/18 1533 12/26/18 1944 12/26/18 2348 12/27/18 0259 12/27/18 0757  GLUCAP 124* 145* 126* 105* 95    Iron Studies: No results for input(s): IRON, TIBC, TRANSFERRIN, FERRITIN in the last 72 hours. Studies/Results: Dg Chest Port 1 View  Result Date: 12/26/2018 CLINICAL DATA:  Encounter for central line placement. EXAM: PORTABLE CHEST 1 VIEW COMPARISON:  12/26/2018 FINDINGS: There is a right IJ catheter with tip in the projection of the SVC. Left IJ catheter has been recently placed and tip is projecting over the cavoatrial junction. ET tube tip is above the carina. There is an enteric tube with tip just below the GE junction. Normal heart  size. Diffuse bilateral pulmonary opacities are again noted and appear unchanged from previous exam IMPRESSION: 1. Interval placement of a left IJ catheter with tip projecting over the cavoatrial junction. No pneumothorax identified. 2. No change in aeration to lungs compared with previous exam. Electronically Signed   By: Kerby Moors M.D.   On: 12/26/2018 18:05   Dg Chest Port 1 View  Result Date: 12/26/2018 CLINICAL DATA:  Respiratory failure EXAM: PORTABLE CHEST 1 VIEW COMPARISON:  12/25/2018 FINDINGS: Endotracheal tube terminates 2.5 cm above the carina. Right IJ venous catheter terminates in the mid SVC. Enteric tube terminates in the gastric cardia. Multifocal patchy opacities in the lungs bilaterally, left upper lobe predominant, progressive from the prior and suspicious for multifocal pneumonia. No definite pleural effusions. No pneumothorax. The heart is top-normal in size. IMPRESSION: Endotracheal tube terminates 2.5 cm above the carina. Multifocal pneumonia, mildly progressive. Electronically Signed   By: Julian Hy M.D.   On: 12/26/2018 06:54   Medications: Infusions: .  prismasol BGK 4/2.5 300 mL/hr at 12/27/18 0419  .  prismasol BGK 4/2.5 300 mL/hr at 12/27/18 0449  . sodium chloride Stopped (12/27/18 0427)  . dexmedetomidine (PRECEDEX) IV infusion 1.4 mcg/kg/hr (12/27/18 0800)  . feeding supplement (VITAL AF 1.2 CAL) 40 mL/hr at 12/27/18 0700  . fentaNYL infusion INTRAVENOUS 200 mcg/hr (12/27/18 0800)  . nafcillin IV 200 mL/hr at 12/27/18 0800  . norepinephrine (LEVOPHED) Adult infusion 6 mcg/min (12/27/18 0700)  . prismasol BGK 4/2.5 2,000 mL/hr at 12/27/18 0603    Scheduled Medications: . amiodarone  200 mg Per Tube BID  . aspirin  81 mg Per Tube Daily  . chlorhexidine gluconate (MEDLINE KIT)  15 mL Mouth Rinse BID  . Chlorhexidine Gluconate Cloth  6 each Topical Daily  . Chlorhexidine Gluconate Cloth  6 each Topical Q0600  . darbepoetin (ARANESP) injection -  DIALYSIS  150 mcg Intravenous Q Thu-HD  . feeding supplement (PRO-STAT SUGAR FREE 64)  60 mL Per Tube QID  . mouth rinse  15 mL Mouth Rinse 10 times per day  . pantoprazole sodium  40 mg Per Tube Q24H  . sodium chloride flush  10-40 mL Intracatheter Q12H  . sodium chloride flush  3 mL Intravenous Once    have reviewed scheduled and prn medications.  Physical Exam: General: sedated on vent  Heart: tachy Lungs: mostly clear Abdomen: soft, non tender Extremities: really no edema Dialysis Access: right AVF patent- also with right IJ temp HD cath for CRRT  - placed 5/12-    12/27/2018,8:06 AM  LOS: 7 days

## 2018-12-27 NOTE — Progress Notes (Signed)
NAME:  Thomas Adams, MRN:  [id number], DOB:  1961/01/13, LOS: 7 ADMISSION DATE:  01/07/2019, CONSULTATION DATE:  12/27/18 REFERRING MD:  Dr. Germaine Pomfret , CHIEF COMPLAINT:  Left side weakness   Brief History   58 yr old with history of HTN, afib, febrile and coughing x 1 d, found with left facial droop, left sided weakness 5/10. 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 Brought to ICU intubated  Past Medical History  HTN ESRD, dialysis TThSat Afib w/ RVR  Significant Hospital Events   5/10 Angiogram in IR >> no clot retraction due to distal location of thrombus; hypotension from meds 5/11 d/c diprivan due to triglyceride levels 5/12 start CRRT; start amiodarone 5/14 Off pressors, on CRRT 5/17 difficult day yesterday, resume CRRT on pressors-being weaned  Consults:  Nephrology  ID Cardiology   Procedures:  ETT 5/10 >>  Rt radial aline 5/10 >>  Rt IJ HD cath 5/11 >>  Left IJ triple-lumen catheter insertion 5/16  Significant Diagnostic Tests:  CT angio head/neck 5/10 >> Rt M2 occlusion, severe Lt MCA stenosis Echo 5/10 >> EF greater than 65%, severe LCH, severe MR MRI brain 5/11 >> multifocal acute ischemia scattered in both cerebral and cerebellar hemispheres  5/17-chest x-ray revealed improvement in congestion/infiltrate Micro Data:  COVID 5/10 >> negative Blood 5/10 >> MSSA  Antimicrobials:  Cefepime 5/10 >> 5/11 Vancomycin 5/10 >> 5/11 Ancef 5/11 >>5/13 Nafcillin 5/13 >  Subjective:   Remains critically ill, on vent On Levophed-been clean On CRRT Still requiring Precedex for sedation-gets very agitated off sedation  Objective   Blood pressure (!) 89/71, pulse (!) 114, temperature 97.6 F (36.4 C), temperature source Axillary, resp. rate 16, height 5\' 9"  (1.753 m), weight 96 kg, SpO2 100 %.    Vent Mode: PCV FiO2 (%):  [40 %-90 %] 40 % Set Rate:  [18 bmp] 18 bmp PEEP:  [10 cmH20-15 cmH20] 10  cmH20 Plateau Pressure:  [23 WUJ81-19 cmH20] 23 cmH20   Intake/Output Summary (Last 24 hours) at 12/27/2018 0809 Last data filed at 12/27/2018 0800 Gross per 24 hour  Intake 3451.04 ml  Output 3739 ml  Net -287.96 ml   Filed Weights   01/06/2019 1500 12/25/18 0440 12/27/18 0500  Weight: 85.3 kg 96.1 kg 96 kg    Examination: Adult male, intubated, does not appear to be in distress Endotracheal tube in place S1-S2 appreciated, no JVD Abdomen is soft, bowel sounds appreciated Breath sounds clear bilaterally Bilateral lower extremity edema Skin is warm and dry Sedated, spontaneous movement of extremities   Resolved Hospital Problem list     Assessment & Plan:   Acute respiratory failure due to airway compromise, in the setting of CVA Pulmonary edema Plan: Continue sedation with Precedex Breathing trials as tolerated Not an extubation candidate today Continue ventilator support  Septic shock, 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 -Wean Levophed as tolerated -Nafcillin per ID-6 weeks of treatment anticipated -Trend CBC  CVA -Thrombectomy by IR unsuccessful -Neurology following -Aspirin  End-stage renal disease -Nephrology following -On CRRT  Agitation -Continue Precedex with a Ross goal of 0 to -1  Anemia of critical illness Thrombocytopenia -CBC -Transfuse if hemoglobin less than 7  Infective endocarditis Mitral regurgitation Atrial fibrillation -We will require 6 weeks of antibiotics -TEE if surgery planned -Amiodarone for atrial fibrillation     Best practice:  Diet: On tube feeds DVT prophylaxis: SCDs, on heparin  GI prophylaxis: Protonix Mobility: Bedrest Code  Status: Full code Disposition: ICU  Not a candidate for extubation at present Very agitated off sedation On pressors, on CRRT Chest x-ray does show some improvement today  The patient is critically ill with multiple organ system failure and requires high complexity decision making  for assessment and support, frequent evaluation and titration of therapies, advanced monitoring, review of radiographic studies and interpretation of complex data.    Critical Care Time devoted to patient care services, exclusive of separately billable procedures, described in this note is 32 minutes.  Sherrilyn Rist, MD [phone]

## 2018-12-27 NOTE — Plan of Care (Signed)
Pt currently receiving tube feeds via OG.  Will request swallow evaluation once patient is extubated.  Hiram Gash RN

## 2018-12-27 NOTE — Progress Notes (Signed)
CRITICAL VALUE ALERT  Critical Value:  Hemoglobin 6.8  Date & Time Notied:  12/27/18 11:40  Provider Notified: Marni Griffon NP  Orders Received/Actions taken: 1 U PRBC will be given

## 2018-12-27 NOTE — Progress Notes (Signed)
Assisted tele visit to patient with brother.  Thomas Adams, Thomas Hoard, RN

## 2018-12-27 NOTE — Progress Notes (Signed)
SLP Cancellation Note  Patient Details Name: Thomas Adams MRN: [id number] DOB: [birth year]   Cancelled treatment:       Reason Eval/Treat Not Completed: Medical issues which prohibited therapy  Patient remains intubated.  ST will continue efforts.   Shelly Flatten, MA, [facility] SLP [phone]  Lamar Sprinkles 12/27/2018, 7:34 AM

## 2018-12-28 ENCOUNTER — Inpatient Hospital Stay (HOSPITAL_COMMUNITY): Payer: Medicare Other

## 2018-12-28 DIAGNOSIS — R451 Restlessness and agitation: Secondary | ICD-10-CM

## 2018-12-28 DIAGNOSIS — I76 Septic arterial embolism: Secondary | ICD-10-CM

## 2018-12-28 LAB — CBC
HCT: 24.3 % — ABNORMAL LOW (ref 39.0–52.0)
Hemoglobin: 8.1 g/dL — ABNORMAL LOW (ref 13.0–17.0)
MCH: 30.8 pg (ref 26.0–34.0)
MCHC: 33.3 g/dL (ref 30.0–36.0)
MCV: 92.4 fL (ref 80.0–100.0)
Platelets: 144 10*3/uL — ABNORMAL LOW (ref 150–400)
RBC: 2.63 MIL/uL — ABNORMAL LOW (ref 4.22–5.81)
RDW: 17.2 % — ABNORMAL HIGH (ref 11.5–15.5)
WBC: 48.5 10*3/uL — ABNORMAL HIGH (ref 4.0–10.5)
nRBC: 0.1 % (ref 0.0–0.2)

## 2018-12-28 LAB — RENAL FUNCTION PANEL
Albumin: 1.7 g/dL — ABNORMAL LOW (ref 3.5–5.0)
Albumin: 1.7 g/dL — ABNORMAL LOW (ref 3.5–5.0)
Anion gap: 10 (ref 5–15)
Anion gap: 11 (ref 5–15)
BUN: 55 mg/dL — ABNORMAL HIGH (ref 6–20)
BUN: 50 mg/dL — ABNORMAL HIGH (ref 6–20)
CO2: 23 mmol/L (ref 22–32)
CO2: 25 mmol/L (ref 22–32)
Calcium: 8.7 mg/dL — ABNORMAL LOW (ref 8.9–10.3)
Calcium: 8.7 mg/dL — ABNORMAL LOW (ref 8.9–10.3)
Chloride: 104 mmol/L (ref 98–111)
Chloride: 103 mmol/L (ref 98–111)
Creatinine, Ser: 2.82 mg/dL — ABNORMAL HIGH (ref 0.61–1.24)
Creatinine, Ser: 2.61 mg/dL — ABNORMAL HIGH (ref 0.61–1.24)
GFR calc Af Amer: 28 mL/min — ABNORMAL LOW (ref >60)
GFR calc Af Amer: 31 mL/min — ABNORMAL LOW (ref >60)
GFR calc non Af Amer: 24 mL/min — ABNORMAL LOW (ref >60)
GFR calc non Af Amer: 26 mL/min — ABNORMAL LOW (ref >60)
Glucose, Bld: 121 mg/dL — ABNORMAL HIGH (ref 70–99)
Glucose, Bld: 126 mg/dL — ABNORMAL HIGH (ref 70–99)
Phosphorus: 4 mg/dL (ref 2.5–4.6)
Phosphorus: 4.9 mg/dL — ABNORMAL HIGH (ref 2.5–4.6)
Potassium: 4.3 mmol/L (ref 3.5–5.1)
Potassium: 4.7 mmol/L (ref 3.5–5.1)
Sodium: 137 mmol/L (ref 135–145)
Sodium: 139 mmol/L (ref 135–145)

## 2018-12-28 LAB — GLUCOSE, CAPILLARY
Glucose-Capillary: 111 mg/dL — ABNORMAL HIGH (ref 70–99)
Glucose-Capillary: 96 mg/dL (ref 70–99)
Glucose-Capillary: 115 mg/dL — ABNORMAL HIGH (ref 70–99)
Glucose-Capillary: 90 mg/dL (ref 70–99)
Glucose-Capillary: 97 mg/dL (ref 70–99)
Glucose-Capillary: 107 mg/dL — ABNORMAL HIGH (ref 70–99)

## 2018-12-28 LAB — POCT ACTIVATED CLOTTING TIME
Activated Clotting Time: 186 seconds
Activated Clotting Time: 186 seconds
Activated Clotting Time: 186 seconds
Activated Clotting Time: 191 seconds
Activated Clotting Time: 191 seconds
Activated Clotting Time: 191 seconds
Activated Clotting Time: 191 seconds
Activated Clotting Time: 197 seconds
Activated Clotting Time: 197 seconds
Activated Clotting Time: 202 seconds
Activated Clotting Time: 208 seconds
Activated Clotting Time: 208 seconds
Activated Clotting Time: 208 seconds

## 2018-12-28 LAB — CULTURE, BLOOD (ROUTINE X 2)
Culture: NO GROWTH
Culture: NO GROWTH
Special Requests: ADEQUATE
Special Requests: ADEQUATE

## 2018-12-28 LAB — MAGNESIUM: Magnesium: 2.6 mg/dL — ABNORMAL HIGH (ref 1.7–2.4)

## 2018-12-28 LAB — APTT: aPTT: 84 seconds — ABNORMAL HIGH (ref 24–36)

## 2018-12-28 MED ORDER — VITAL AF 1.2 CAL PO LIQD
1000.0000 mL | ORAL | Status: DC
  Administered 2018-12-29 – 2018-12-30 (×2): 1000 mL

## 2018-12-28 MED ORDER — LORAZEPAM 2 MG/ML IJ SOLN
2.0000 mg | Freq: Four times a day (QID) | INTRAMUSCULAR | Status: DC | PRN
  Administered 2018-12-28 – 2018-12-31 (×10): 2 mg via INTRAVENOUS
  Filled 2018-12-28 (×10): qty 1

## 2018-12-28 MED ORDER — CHLORHEXIDINE GLUCONATE CLOTH 2 % EX PADS
6.0000 | MEDICATED_PAD | Freq: Every day | CUTANEOUS | Status: DC
  Administered 2018-12-29 – 2018-12-30 (×3): 6 via TOPICAL

## 2018-12-28 NOTE — Progress Notes (Signed)
Informed about patient's agitation despite fentanyl and Precedex We will add Ativan 2 mg every 6 as needed

## 2018-12-28 NOTE — Anesthesia Postprocedure Evaluation (Signed)
Anesthesia Post Note  Patient: Makih Stefanko  Procedure(s) Performed: RADIOLOGY WITH ANESTHESIA (N/A )     Patient location during evaluation: SICU Anesthesia Type: General Level of consciousness: sedated Pain management: pain level controlled Vital Signs Assessment: post-procedure vital signs reviewed and stable Respiratory status: patient remains intubated per anesthesia plan Cardiovascular status: stable Postop Assessment: no apparent nausea or vomiting Anesthetic complications: no    Last Vitals:  Vitals:   12/28/18 1800 12/28/18 1815  BP: (!) 78/54 (!) 86/54  Pulse:    Resp: 18 19  Temp:    SpO2: 93% 95%    Last Pain:  Vitals:   12/28/18 1600  TempSrc: Axillary  PainSc:                  Abdurrahman Petersheim

## 2018-12-28 NOTE — Progress Notes (Signed)
[facility]  Date of Admission:  12/22/2018      Total days of antibiotics 9  Day 6 Nafcillin          ASSESSMENT: Thomas Adams is a 58 y.o. 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 He unfortunately has not been able to wean from the ventilator over the weekend d/t agitation and increasing oxygen requirements. He is also back on CVVH and intermittent vasopressor support.   With regards to his elevating white blood cell count, this may be more reflective of the burden of his infection vs evolution of possible CNS abscess/cerebritis. Alternatively it is possible he may have a developing secondary acquired pneumonia given pulmonary status. AVF unremarkable. He is not having fevers however he is on CRRT which could mask them. Will repeat blood cultures today to ensure he is not bacteremic.   Nafcillin would be preferred for treatment of his MSSA infection especially with CNS involvement, however if he continues to worsen would recommend adding cefepime to nafcillin to treat for possible acquired pneumonia. TCTS (Dr. Roxy Manns) to evaluate today for possible surgical intervention.    PLAN:  1. Continue nafcillin  2. Repeat blood cultures today  3. Would add cefepime to nafcillin if his pulmonary status continues to worsen.     Principal Problem:   Bacterial endocarditis, presumed Active Problems:   Stroke ([place])   Septic shock ([place])   MSSA bacteremia   Acute respiratory failure with hypoxia ([facility])   Paroxysmal atrial fibrillation ([facility])   CKD (chronic kidney disease) stage V requiring chronic dialysis ([place])   End stage renal disease ([place])   Encounter for central line placement   Middle cerebral artery embolism, right   . amiodarone  200 mg Per Tube BID  . chlorhexidine gluconate (MEDLINE KIT)  15 mL Mouth Rinse BID  . Chlorhexidine Gluconate Cloth  6 each  Topical Daily  . Chlorhexidine Gluconate Cloth  6 each Topical Q0600  . Chlorhexidine Gluconate Cloth  6 each Topical Daily  . darbepoetin (ARANESP) injection - DIALYSIS  150 mcg Intravenous Q Thu-HD  . feeding supplement (PRO-STAT SUGAR FREE 64)  60 mL Per Tube QID  . mouth rinse  15 mL Mouth Rinse 10 times per day  . pantoprazole sodium  40 mg Per Tube Q24H  . sodium chloride flush  10-40 mL Intracatheter Q12H  . sodium chloride flush  3 mL Intravenous Once    SUBJECTIVE: Intubated on sedation.   Interim history noted for resuming CRRT, pressor need +/- over weekend. Afebrile but now with significantly elevated leukocytosis 48.5K. CXR today indicates "interstitial and patchy airspace opacities throughout both lungs mildly to moderately increased."   Review of Systems: Review of Systems  Unable to perform ROS: Intubated    No Active Allergies  OBJECTIVE: Vitals:   12/28/18 1536 12/28/18 1538 12/28/18 1545 12/28/18 1600  BP: 101/65  107/67 95/60  Pulse: 86     Resp: (!) _0 Temp:    98.6 F (37 C)  TempSrc:    Axillary  SpO2: 100% 100%  98%  Weight:      Height:       Body mass index is 31.25 kg/m.  Physical Exam Constitutional:      Comments: Intubated. Comfortably sedated in bed presently.   HENT:     Mouth/Throat:  Mouth: Mucous membranes are moist.     Pharynx: Oropharynx is clear.  Eyes:     General: No scleral icterus.    Pupils: Pupils are equal, round, and reactive to light.  Neck:     Comments: R IJ HD catheter clean and dry  L IJ CVC catheter clean and dry  Cardiovascular:     Rate and Rhythm: Tachycardia present. Rhythm irregular.     Pulses: Normal pulses.     Heart sounds: Murmur (Systolic murmur 2/6 at apex ) present.  Pulmonary:     Comments: Breathing comfortably on ventilator with sedation.  Diffusely coarse R>L.  FiO2 80% PEEP 10 Abdominal:     General: Bowel sounds are normal.     Palpations: Abdomen is soft.     Tenderness:  There is no abdominal tenderness.  Skin:    General: Skin is warm and dry.     Capillary Refill: Capillary refill takes less than 2 seconds.  Neurological:     Mental Status: He is oriented to person, place, and time.    Lab Results Lab Results  Component Value Date   WBC 48.5 (H) 12/28/2018   HGB 8.1 (L) 12/28/2018   HCT 24.3 (L) 12/28/2018   MCV 92.4 12/28/2018   PLT 144 (L) 12/28/2018    Lab Results  Component Value Date   CREATININE 2.61 (H) 12/28/2018   BUN 50 (H) 12/28/2018   NA 139 12/28/2018   K 4.7 12/28/2018   CL 103 12/28/2018   CO2 25 12/28/2018    Lab Results  Component Value Date   ALT 21 01/07/2019   AST 53 (H) 01/04/2019   ALKPHOS 102 12/25/2018   BILITOT 1.3 (H) 12/19/2018     Microbiology: Recent Results (from the past 240 hour(s))  SARS Coronavirus 2 (CEPHEID- Performed in [facility] hospital lab), Hosp Order     Status: None   Collection Time: 12/22/2018 11:45 AM  Result Value Ref Range Status   SARS Coronavirus 2 NEGATIVE NEGATIVE Final    Comment: (NOTE) If result is NEGATIVE SARS-CoV-2 target nucleic acids are NOT DETECTED. The SARS-CoV-2 RNA is generally detectable in upper and lower  respiratory specimens during the acute phase of infection. The lowest  concentration of SARS-CoV-2 viral copies this assay can detect is 250  copies / mL. A negative result does not preclude SARS-CoV-2 infection  and should not be used as the sole basis for treatment or other  patient management decisions.  A negative result may occur with  improper specimen collection / handling, submission of specimen other  than nasopharyngeal swab, presence of viral mutation(s) within the  areas targeted by this assay, and inadequate number of viral copies  (<250 copies / mL). A negative result must be combined with clinical  observations, patient history, and epidemiological information. If result is POSITIVE SARS-CoV-2 target nucleic acids are DETECTED. The SARS-CoV-2  RNA is generally detectable in upper and lower  respiratory specimens dur ing the acute phase of infection.  Positive  results are indicative of active infection with SARS-CoV-2.  Clinical  correlation with patient history and other diagnostic information is  necessary to determine patient infection status.  Positive results do  not rule out bacterial infection or co-infection with other viruses. If result is PRESUMPTIVE POSTIVE SARS-CoV-2 nucleic acids MAY BE PRESENT.   A presumptive positive result was obtained on the submitted specimen  and confirmed on repeat testing.  While 2019 novel coronavirus  (SARS-CoV-2) nucleic acids may be  present in the submitted sample  additional confirmatory testing may be necessary for epidemiological  and / or clinical management purposes  to differentiate between  SARS-CoV-2 and other Sarbecovirus currently known to infect humans.  If clinically indicated additional testing with an alternate test  methodology [phone]) is advised. The SARS-CoV-2 RNA is generally  detectable in upper and lower respiratory sp ecimens during the acute  phase of infection. The expected result is Negative. Fact Sheet for Patients:  StrictlyIdeas.no Fact Sheet for Healthcare Providers: BankingDealers.co.za This test is not yet approved or cleared by the [place] FDA and has been authorized for detection and/or diagnosis of SARS-CoV-2 by FDA under an Emergency Use Authorization (EUA).  This EUA will remain in effect (meaning this test can be used) for the duration of the COVID-19 declaration under Section 564(b)(1) of the Act, 21 U.S.C. section 360bbb-3(b)(1), unless the authorization is terminated or revoked sooner. Performed at [facility], [place] [address], [place], [place] [address]   Blood Culture (routine x 2)     Status: Abnormal   Collection Time: 01/06/2019  1:57 PM  Result Value Ref Range Status    Specimen Description BLOOD RIGHT FOREARM  Final   Special Requests   Final    BOTTLES DRAWN AEROBIC AND ANAEROBIC Blood Culture adequate volume   Culture  Setup Time   Final    GRAM POSITIVE COCCI IN BOTH AEROBIC AND ANAEROBIC BOTTLES CRITICAL RESULT CALLED TO, READ BACK BY AND VERIFIED WITH: J. LEDFORD,PHARMD [id number] 12/21/2018 T. TYSOR    Culture (A)  Final    STAPHYLOCOCCUS AUREUS SUSCEPTIBILITIES PERFORMED ON PREVIOUS CULTURE WITHIN THE LAST 5 DAYS. Performed at [facility], [place] [address] [place], [place] [address]    Report Status 12/23/2018 FINAL  Final  Blood Culture (routine x 2)     Status: Abnormal   Collection Time: 01/05/2019  1:58 PM  Result Value Ref Range Status   Specimen Description BLOOD RIGHT HAND  Final   Special Requests   Final    BOTTLES DRAWN AEROBIC AND ANAEROBIC Blood Culture adequate volume   Culture  Setup Time   Final    GRAM POSITIVE COCCI IN BOTH AEROBIC AND ANAEROBIC BOTTLES CRITICAL RESULT CALLED TO, READ BACK BY AND VERIFIED WITH: J. LEDFORD,PHARMD [id number] 12/21/2018 Mena Goes Performed at [facility], [place] [address], [place],  [address]    Culture STAPHYLOCOCCUS AUREUS (A)  Final   Report Status 12/23/2018 FINAL  Final   Organism ID, Bacteria STAPHYLOCOCCUS AUREUS  Final      Susceptibility   Staphylococcus aureus - MIC*    CIPROFLOXACIN <=0.5 SENSITIVE Sensitive     ERYTHROMYCIN <=0.25 SENSITIVE Sensitive     GENTAMICIN <=0.5 SENSITIVE Sensitive     OXACILLIN <=0.25 SENSITIVE Sensitive     TETRACYCLINE <=1 SENSITIVE Sensitive     VANCOMYCIN <=0.5 SENSITIVE Sensitive     TRIMETH/SULFA <=10 SENSITIVE Sensitive     CLINDAMYCIN <=0.25 SENSITIVE Sensitive     RIFAMPIN <=0.5 SENSITIVE Sensitive     Inducible Clindamycin NEGATIVE Sensitive     * STAPHYLOCOCCUS AUREUS  Blood Culture ID Panel (Reflexed)     Status: Abnormal   Collection Time: 12/16/2018  1:58 PM  Result Value Ref Range Status   Enterococcus species NOT DETECTED NOT  DETECTED Final   Listeria monocytogenes NOT DETECTED NOT DETECTED Final   Staphylococcus species DETECTED (A) NOT DETECTED Final    Comment: CRITICAL RESULT CALLED TO, READ BACK BY  AND VERIFIED WITH: J. LEDFORD,PHARMD [id number] 12/21/2018 T. TYSOR    Staphylococcus aureus (BCID) DETECTED (A) NOT DETECTED Final    Comment: Methicillin (oxacillin) susceptible Staphylococcus aureus (MSSA). Preferred therapy is anti staphylococcal beta lactam antibiotic (Cefazolin or Nafcillin), unless clinically contraindicated. CRITICAL RESULT CALLED TO, READ BACK BY AND VERIFIED WITH: J. LEDFORD,PHARMD 0209 12/21/2018 T. TYSOR    Methicillin resistance NOT DETECTED NOT DETECTED Final   Streptococcus species NOT DETECTED NOT DETECTED Final   Streptococcus agalactiae NOT DETECTED NOT DETECTED Final   Streptococcus pneumoniae NOT DETECTED NOT DETECTED Final   Streptococcus pyogenes NOT DETECTED NOT DETECTED Final   Acinetobacter baumannii NOT DETECTED NOT DETECTED Final   Enterobacteriaceae species NOT DETECTED NOT DETECTED Final   Enterobacter cloacae complex NOT DETECTED NOT DETECTED Final   Escherichia coli NOT DETECTED NOT DETECTED Final   Klebsiella oxytoca NOT DETECTED NOT DETECTED Final   Klebsiella pneumoniae NOT DETECTED NOT DETECTED Final   Proteus species NOT DETECTED NOT DETECTED Final   Serratia marcescens NOT DETECTED NOT DETECTED Final   Haemophilus influenzae NOT DETECTED NOT DETECTED Final   Neisseria meningitidis NOT DETECTED NOT DETECTED Final   Pseudomonas aeruginosa NOT DETECTED NOT DETECTED Final   Candida albicans NOT DETECTED NOT DETECTED Final   Candida glabrata NOT DETECTED NOT DETECTED Final   Candida krusei NOT DETECTED NOT DETECTED Final   Candida parapsilosis NOT DETECTED NOT DETECTED Final   Candida tropicalis NOT DETECTED NOT DETECTED Final    Comment: Performed at [facility], [place]. [address] [place], [place] [address]  MRSA PCR Screening     Status: None   Collection  Time: 12/12/2018  3:15 PM  Result Value Ref Range Status   MRSA by PCR NEGATIVE NEGATIVE Final    Comment:        The GeneXpert MRSA Assay (FDA approved for NASAL specimens only), is one component of a comprehensive MRSA colonization surveillance program. It is not intended to diagnose MRSA infection nor to guide or monitor treatment for MRSA infections. Performed at [facility], [place] [address], [place], [place] [address]   Urine Culture     Status: None   Collection Time: 12/21/18 11:04 PM  Result Value Ref Range Status   Specimen Description URINE, CATHETERIZED  Final   Special Requests NONE  Final   Culture   Final    NO GROWTH Performed at [facility], [address], [place], [place] [address]    Report Status 12/22/2018 FINAL  Final  Culture, blood (routine x 2)     Status: None   Collection Time: 12/23/18  6:14 AM  Result Value Ref Range Status   Specimen Description BLOOD RIGHT ARM  Final   Special Requests   Final    BOTTLES DRAWN AEROBIC ONLY Blood Culture adequate volume   Culture   Final    NO GROWTH 5 DAYS Performed at [facility], [place] [address] [place], [place] [address]    Report Status 12/28/2018 FINAL  Final  Culture, blood (routine x 2)     Status: None   Collection Time: 12/23/18  6:50 AM  Result Value Ref Range Status   Specimen Description BLOOD RIGHT ARM  Final   Special Requests   Final    BOTTLES DRAWN AEROBIC ONLY Blood Culture adequate volume   Culture   Final    NO GROWTH 5 DAYS Performed at [facility], [place] [address], [place], [place] [address]  Report Status 12/28/2018 FINAL  Final    Janene Madeira, MSN, NP-C [facility] Cell: [phone] Pager: [phone]  12/28/2018  4:39 PM

## 2018-12-28 NOTE — Progress Notes (Signed)
Nutrition Follow-up RD working remotely.  DOCUMENTATION CODES:   Not applicable  INTERVENTION:   Increase Vital AF 1.2 to 50 ml/hr via OG tube Continue 60 ml Prostat QID  Provides: 2240 kcal, 210 grams protein, 973 ml free water  Recommend checking Copper, zinc, and Vitamin C levels - spoke with MD; will order.   NUTRITION DIAGNOSIS:   Increased nutrient needs related to acute illness as evidenced by estimated needs. Ongoing.   GOAL:   Patient will meet greater than or equal to 90% of their needs Progressing.   MONITOR:   Vent status, I & O's, TF tolerance  REASON FOR ASSESSMENT:   Consult, Ventilator Enteral/tube feeding initiation and management  ASSESSMENT:   Pt with PMH of HTN, afib, ESRD on HD TThSat admitted with CNS emboli, R M2 occlusion, severe L MCA stenosis and sepsis with staph aureus bacteremia in setting of new MR.     5/10 OG tube placed and recommended to advance 5/12 - 5/14 CRRT  5/16 restarted on CRRT  Per notes pt is agitated on fentanyl and precedex, ativan added  Patient is currently intubated on ventilator support MV: 19.4 L/min Temp (24hrs), Avg:98.5 F (36.9 C), Min:97.7 F (36.5 C), Max:99.1 F (37.3 C)  Medications reviewed Levophed @ 8 mcg Labs reviewed Pt is 9.6 L positive     NUTRITION - FOCUSED PHYSICAL EXAM:  Deferred   Diet Order:   Diet Order            Diet NPO time specified  Diet effective now              EDUCATION NEEDS:   No education needs have been identified at this time  Skin:  Skin Assessment: Reviewed RN Assessment  Last BM:  600 ml via rectal tube  Height:   Ht Readings from Last 1 Encounters:  01/04/2019 5\' 9"  (1.753 m)    Weight:   Wt Readings from Last 1 Encounters:  12/27/18 96 kg    Ideal Body Weight:     BMI:  Body mass index is 31.25 kg/m.  Estimated Nutritional Needs:   Kcal:  2252  Protein:  170-213 grams  Fluid:  > 1.9 L/day  Maylon Peppers RD, LDN,  CNSC [phone] Pager [phone] After Hours Pager

## 2018-12-28 NOTE — Progress Notes (Signed)
STROKE TEAM PROGRESS NOTE   INTERVAL HISTORY Pt more awake alert and follows simple commands on the right hand. Still has intermittent agitation even on precedex and fentanyl. Still has significant leukocytosis and tachycardia and tachypnea. No fever. Still on levophed for low BP. Still on CRRT and Hb stable.  Vitals:   12/28/18 1130 12/28/18 1141 12/28/18 1142 12/28/18 1145  BP: 114/69 114/69    Pulse: (!) 126 (!) 106    Resp: 20 (!) 22    Temp:    99.1 F (37.3 C)  TempSrc:    Axillary  SpO2: 98% 93% 93%   Weight:      Height:        CBC:  Recent Labs  Lab 12/27/18 1108 12/27/18 1715 12/28/18 0616  WBC 29.9*  --  48.5*  HGB 6.8* 8.0* 8.1*  HCT 20.0* 24.3* 24.3*  MCV 89.7  --  92.4  PLT 135*  --  144*    Basic Metabolic Panel:  Recent Labs  Lab 12/27/18 0500 12/27/18 1550 12/28/18 0616  NA 138 138 137  K 4.4 4.6 4.3  CL 104 104 104  CO2 22 23 23   GLUCOSE 130* 147* 121*  BUN 87* 77* 55*  CREATININE 4.18* 3.63* 2.82*  CALCIUM 8.8* 8.8* 8.7*  MG 2.5*  --  2.6*  PHOS 4.4 5.1* 4.0   Ct Angio Head W Or Wo Contrast  Result Date: 12/18/2018 CLINICAL DATA:  Left-sided weakness, facial droop, and slurred speech. EXAM: CT ANGIOGRAPHY HEAD AND NECK CT PERFUSION BRAIN TECHNIQUE: Multidetector CT imaging of the head and neck was performed using the standard protocol during bolus administration of intravenous contrast. Multiplanar CT image reconstructions and MIPs were obtained to evaluate the vascular anatomy. Carotid stenosis measurements (when applicable) are obtained utilizing NASCET criteria, using the distal internal carotid diameter as the denominator. Multiphase CT imaging of the brain was performed following IV bolus contrast injection. Subsequent parametric perfusion maps were calculated using RAPID software. CONTRAST:  143mL OMNIPAQUE IOHEXOL 350 MG/ML SOLN COMPARISON:  None. FINDINGS: CTA NECK FINDINGS The study is motion degraded including moderate to severe motion  artifact through the larynx which limits assessment of the carotid bifurcations. Aortic arch: Standard 3 vessel aortic arch with mild atherosclerotic plaque. No significant arch vessel origin stenosis. Right carotid system: Patent with moderate calcified atherosclerosis about the carotid bifurcation. No evidence of dissection or significant stenosis within limitations of motion artifact. Left carotid system: Patent with mild calcified atherosclerosis about the carotid bifurcation without evidence of significant stenosis or dissection. Vertebral arteries: Patent with the left being mildly dominant. Calcified atherosclerosis at the vertebral artery origins results in moderate to severe stenosis bilaterally. Skeleton: Mild cervical disc degeneration. 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 Upper chest: Clear lung apices. Review of the MIP images confirms the above findings CTA HEAD FINDINGS Anterior circulation: The internal carotid arteries are patent from skull base to carotid termini with calcified atherosclerosis resulting in mild cavernous stenosis on the right. The right M1 segment is widely patent. Branch vessel assessment is limited by motion artifact, however there is a suspected mid right M2 branch vessel occlusion. The left MCA appears duplicated with suggestion of severe stenosis of the more superior origin although motion artifact may be contributing to this appearance. ACAs are patent without evidence of flow limiting proximal stenosis. No aneurysm is identified. Posterior circulation: The intracranial vertebral arteries are patent to the basilar without evidence of significant stenosis. P ICAs  and a ICAs are not well evaluated. SCA is are patent. The basilar artery is widely patent. There is a small right posterior communicating artery. PCAs are patent without evidence of flow limiting proximal stenosis allowing for suspected artifactual narrowing in the proximal right P2  segment. No aneurysm is identified. Venous sinuses: Patent. Anatomic variants: None. Review of the MIP images confirms the above findings CT Brain Perfusion Findings: ASPECTS: 10 CBF (<30%) Volume: 22mL Perfusion (Tmax>6.0s) volume: 44mL Mismatch Volume: 42mL Infarction Location: No core infarct by standard RAPID criteria. Penumbra anterolaterally in the right parietal lobe. IMPRESSION: 1. Motion degraded examination with limited assessment of the small and medium-sized intracranial arteries. 2. Suspected mid right M2 branch vessel occlusion with 9 mL penumbra in the right parietal lobe and no core infarct based on CTP. 3. No more proximal large vessel occlusion. 4. Patent cervical carotid arteries without evidence of significant stenosis. 5. 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 6. Possible severe left MCA origin stenosis. 7.  Aortic Atherosclerosis (ICD10-I70.0). These results were called by telephone at the time of interpretation on 12/12/2018 at 12:29 pm to Dr. Kerney Elbe , who verbally acknowledged these results. Electronically Signed   By: Logan Bores M.D.   On: 01/06/2019 12:59   Ct Angio Neck W Or Wo Contrast  Result Date: 12/18/2018 CLINICAL DATA:  Left-sided weakness, facial droop, and slurred speech. EXAM: CT ANGIOGRAPHY HEAD AND NECK CT PERFUSION BRAIN TECHNIQUE: Multidetector CT imaging of the head and neck was performed using the standard protocol during bolus administration of intravenous contrast. Multiplanar CT image reconstructions and MIPs were obtained to evaluate the vascular anatomy. Carotid stenosis measurements (when applicable) are obtained utilizing NASCET criteria, using the distal internal carotid diameter as the denominator. Multiphase CT imaging of the brain was performed following IV bolus contrast injection. Subsequent parametric perfusion maps were calculated using RAPID software. CONTRAST:  132mL OMNIPAQUE IOHEXOL 350 MG/ML SOLN COMPARISON:   None. FINDINGS: CTA NECK FINDINGS The study is motion degraded including moderate to severe motion artifact through the larynx which limits assessment of the carotid bifurcations. Aortic arch: Standard 3 vessel aortic arch with mild atherosclerotic plaque. No significant arch vessel origin stenosis. Right carotid system: Patent with moderate calcified atherosclerosis about the carotid bifurcation. No evidence of dissection or significant stenosis within limitations of motion artifact. Left carotid system: Patent with mild calcified atherosclerosis about the carotid bifurcation without evidence of significant stenosis or dissection. Vertebral arteries: Patent with the left being mildly dominant. Calcified atherosclerosis at the vertebral artery origins results in moderate to severe stenosis bilaterally. Skeleton: Mild cervical disc degeneration. 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 Upper chest: Clear lung apices. Review of the MIP images confirms the above findings CTA HEAD FINDINGS Anterior circulation: The internal carotid arteries are patent from skull base to carotid termini with calcified atherosclerosis resulting in mild cavernous stenosis on the right. The right M1 segment is widely patent. Branch vessel assessment is limited by motion artifact, however there is a suspected mid right M2 branch vessel occlusion. The left MCA appears duplicated with suggestion of severe stenosis of the more superior origin although motion artifact may be contributing to this appearance. ACAs are patent without evidence of flow limiting proximal stenosis. No aneurysm is identified. Posterior circulation: The intracranial vertebral arteries are patent to the basilar without evidence of significant stenosis. P ICAs and a ICAs are not well evaluated. SCA is are patent. The basilar artery  is widely patent. There is a small right posterior communicating artery. PCAs are patent without evidence of  flow limiting proximal stenosis allowing for suspected artifactual narrowing in the proximal right P2 segment. No aneurysm is identified. Venous sinuses: Patent. Anatomic variants: None. Review of the MIP images confirms the above findings CT Brain Perfusion Findings: ASPECTS: 10 CBF (<30%) Volume: 57mL Perfusion (Tmax>6.0s) volume: 69mL Mismatch Volume: 57mL Infarction Location: No core infarct by standard RAPID criteria. Penumbra anterolaterally in the right parietal lobe. IMPRESSION: 1. Motion degraded examination with limited assessment of the small and medium-sized intracranial arteries. 2. Suspected mid right M2 branch vessel occlusion with 9 mL penumbra in the right parietal lobe and no core infarct based on CTP. 3. No more proximal large vessel occlusion. 4. Patent cervical carotid arteries without evidence of significant stenosis. 5. 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 6. Possible severe left MCA origin stenosis. 7.  Aortic Atherosclerosis (ICD10-I70.0). These results were called by telephone at the time of interpretation on 12/14/2018 at 12:29 pm to Dr. Kerney Elbe , who verbally acknowledged these results. Electronically Signed   By: Logan Bores M.D.   On: 01/10/2019 12:59   Mr Brain Wo Contrast  Result Date: 12/21/2018 CLINICAL DATA:  Stroke follow-up EXAM: MRI HEAD WITHOUT CONTRAST TECHNIQUE: Multiplanar, multiecho pulse sequences of the brain and surrounding structures were obtained without intravenous contrast. COMPARISON:  CTA head neck 12/29/2018 FINDINGS: BRAIN: Multifocal abnormal diffusion restriction within both cerebellar hemispheres and scattered throughout multiple bilateral supratentorial territories. The largest area of ischemia is in the right frontal operculum and insula. The midline structures are normal. No midline shift or other mass effect. Early confluent hyperintense T2-weighted signal of the periventricular and deep white matter, most  commonly due to chronic ischemic microangiopathy. Generalized atrophy without lobar predilection. Multiple scattered foci of microhemorrhage, many of which correspond to areas of ischemia. No mass lesion. VASCULAR: Abnormal flow void within the right transverse sinus. The arterial flow voids are preserved. SKULL AND UPPER CERVICAL SPINE: Calvarial bone marrow signal is normal. There is no skull base mass. Visualized upper cervical spine and soft tissues are normal. SINUSES/ORBITS: Fluid level in the maxillary sinuses. The orbits are normal. IMPRESSION: 1. Multifocal acute ischemia scattered throughout both cerebral and cerebellar hemispheres, with the largest area located in the right frontal operculum and insula. 2. Multifocal petechiae/microhemorrhage at the sites of ischemic infarction. No intraparenchymal hematoma or mass effect. 3. Abnormal flow void in the right transverse sinus could indicate dural venous sinus thrombosis. However, on the earlier CTA and the interventional angiogram, the sinus was clearly patent. Electronically Signed   By: Ulyses Jarred M.D.   On: 12/21/2018 02:28   Ct Cerebral Perfusion W Contrast  Result Date: 12/17/2018 CLINICAL DATA:  Left-sided weakness, facial droop, and slurred speech. EXAM: CT ANGIOGRAPHY HEAD AND NECK CT PERFUSION BRAIN TECHNIQUE: Multidetector CT imaging of the head and neck was performed using the standard protocol during bolus administration of intravenous contrast. Multiplanar CT image reconstructions and MIPs were obtained to evaluate the vascular anatomy. Carotid stenosis measurements (when applicable) are obtained utilizing NASCET criteria, using the distal internal carotid diameter as the denominator. Multiphase CT imaging of the brain was performed following IV bolus contrast injection. Subsequent parametric perfusion maps were calculated using RAPID software. CONTRAST:  131mL OMNIPAQUE IOHEXOL 350 MG/ML SOLN COMPARISON:  None. FINDINGS: CTA NECK  FINDINGS The study is motion degraded including moderate to severe motion artifact through the larynx which limits  assessment of the carotid bifurcations. Aortic arch: Standard 3 vessel aortic arch with mild atherosclerotic plaque. No significant arch vessel origin stenosis. Right carotid system: Patent with moderate calcified atherosclerosis about the carotid bifurcation. No evidence of dissection or significant stenosis within limitations of motion artifact. Left carotid system: Patent with mild calcified atherosclerosis about the carotid bifurcation without evidence of significant stenosis or dissection. Vertebral arteries: Patent with the left being mildly dominant. Calcified atherosclerosis at the vertebral artery origins results in moderate to severe stenosis bilaterally. Skeleton: Mild cervical disc degeneration. 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 Upper chest: Clear lung apices. Review of the MIP images confirms the above findings CTA HEAD FINDINGS Anterior circulation: The internal carotid arteries are patent from skull base to carotid termini with calcified atherosclerosis resulting in mild cavernous stenosis on the right. The right M1 segment is widely patent. Branch vessel assessment is limited by motion artifact, however there is a suspected mid right M2 branch vessel occlusion. The left MCA appears duplicated with suggestion of severe stenosis of the more superior origin although motion artifact may be contributing to this appearance. ACAs are patent without evidence of flow limiting proximal stenosis. No aneurysm is identified. Posterior circulation: The intracranial vertebral arteries are patent to the basilar without evidence of significant stenosis. P ICAs and a ICAs are not well evaluated. SCA is are patent. The basilar artery is widely patent. There is a small right posterior communicating artery. PCAs are patent without evidence of flow limiting proximal  stenosis allowing for suspected artifactual narrowing in the proximal right P2 segment. No aneurysm is identified. Venous sinuses: Patent. Anatomic variants: None. Review of the MIP images confirms the above findings CT Brain Perfusion Findings: ASPECTS: 10 CBF (<30%) Volume: 42mL Perfusion (Tmax>6.0s) volume: 43mL Mismatch Volume: 59mL Infarction Location: No core infarct by standard RAPID criteria. Penumbra anterolaterally in the right parietal lobe. IMPRESSION: 1. Motion degraded examination with limited assessment of the small and medium-sized intracranial arteries. 2. Suspected mid right M2 branch vessel occlusion with 9 mL penumbra in the right parietal lobe and no core infarct based on CTP. 3. No more proximal large vessel occlusion. 4. Patent cervical carotid arteries without evidence of significant stenosis. 5. 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 6. Possible severe left MCA origin stenosis. 7.  Aortic Atherosclerosis (ICD10-I70.0). These results were called by telephone at the time of interpretation on 01/10/2019 at 12:29 pm to Dr. Kerney Elbe , who verbally acknowledged these results. Electronically Signed   By: Logan Bores M.D.   On: 01/08/2019 12:59   Dg Chest Port 1 View  Result Date: 12/28/2018 CLINICAL DATA:  Respiratory failure. EXAM: PORTABLE CHEST 1 VIEW COMPARISON:  12/27/2018 FINDINGS: Endotracheal tube terminates 4 cm above the carina. Bilateral jugular catheters are unchanged. An enteric tube courses into the upper abdomen. The cardiac silhouette is borderline enlarged. There is aortic atherosclerosis. Interstitial and patchy airspace opacities throughout both lungs have mildly to moderately increased in the interim. No sizable pleural effusion or pneumothorax is identified with note made incomplete imaging of the left costophrenic angle. IMPRESSION: Worsening bilateral airspace disease. Electronically Signed   By: Logan Bores M.D.   On: 12/28/2018  09:13   Dg Chest Port 1 View  Result Date: 12/27/2018 CLINICAL DATA:  Intubated EXAM: PORTABLE CHEST 1 VIEW COMPARISON:  One-view chest x-ray 12/26/2018 FINDINGS: Heart size is normal. Endotracheal tube terminates 5.5 cm above the carina, in satisfactory position. Left  IJ line and right IJ sheath are stable. NG tube courses off the inferior border of the film. There is slight improved aeration. Patchy interstitial and airspace disease is again noted bilaterally. IMPRESSION: 1. Support apparatus stable. 2. Slight improved aeration of both lungs. 3. Persistent diffuse interstitial and airspace disease. Electronically Signed   By: San Morelle M.D.   On: 12/27/2018 08:30   Dg Chest Port 1 View  Result Date: 12/26/2018 CLINICAL DATA:  Encounter for central line placement. EXAM: PORTABLE CHEST 1 VIEW COMPARISON:  12/26/2018 FINDINGS: There is a right IJ catheter with tip in the projection of the SVC. Left IJ catheter has been recently placed and tip is projecting over the cavoatrial junction. ET tube tip is above the carina. There is an enteric tube with tip just below the GE junction. Normal heart size. Diffuse bilateral pulmonary opacities are again noted and appear unchanged from previous exam IMPRESSION: 1. Interval placement of a left IJ catheter with tip projecting over the cavoatrial junction. No pneumothorax identified. 2. No change in aeration to lungs compared with previous exam. Electronically Signed   By: Kerby Moors M.D.   On: 12/26/2018 18:05   Dg Chest Port 1 View  Result Date: 12/26/2018 CLINICAL DATA:  Respiratory failure EXAM: PORTABLE CHEST 1 VIEW COMPARISON:  12/25/2018 FINDINGS: Endotracheal tube terminates 2.5 cm above the carina. Right IJ venous catheter terminates in the mid SVC. Enteric tube terminates in the gastric cardia. Multifocal patchy opacities in the lungs bilaterally, left upper lobe predominant, progressive from the prior and suspicious for multifocal pneumonia.  No definite pleural effusions. No pneumothorax. The heart is top-normal in size. IMPRESSION: Endotracheal tube terminates 2.5 cm above the carina. Multifocal pneumonia, mildly progressive. Electronically Signed   By: Julian Hy M.D.   On: 12/26/2018 06:54   Dg Chest Port 1 View  Result Date: 12/25/2018 CLINICAL DATA:  58 year old Thomas Adams with cerebral infarcts, sepsis, bacteremia. EXAM: PORTABLE CHEST 1 VIEW COMPARISON:  12/24/2018 and earlier. FINDINGS: Portable AP semi upright view at 0644 hours. Stable endotracheal tube and right IJ central line. Enteric tube tip at the level of the gastric body, the side hole is not clearly identified. Improved ventilation since yesterday with decreasing indistinct perihilar opacity. Mildly larger lung volumes. Stable cardiac size and mediastinal contours. No pneumothorax, pleural effusion or areas of worsening ventilation. Stable visible bowel gas pattern. IMPRESSION: 1. Stable lines and tubes. 2. Improved ventilation since yesterday with decreasing indistinct perihilar opacity. 3. No new cardiopulmonary abnormality. Electronically Signed   By: Genevie Ann M.D.   On: 12/25/2018 09:21   Dg Chest Port 1 View  Result Date: 12/24/2018 CLINICAL DATA:  Acute respiratory failure. EXAM: PORTABLE CHEST 1 VIEW COMPARISON:  Yesterday. FINDINGS: Endotracheal tube, enteric tube, and right central line remain in place. Persistent low lung volumes. Ill-defined bilateral lung opacities, left greater than right, with slight progression from prior. Worsening retrocardiac and right basilar opacity with more confluent density. No large pleural effusion. No pneumothorax. IMPRESSION: Worsening bilateral perihilar lung opacities, left greater than right. This may be pulmonary edema, ARDS, or multifocal pneumonia. More confluent opacities at the bases is also progressed. Electronically Signed   By: Keith Rake M.D.   On: 12/24/2018 03:58   Dg Chest Port 1 View  Result Date:  12/23/2018 CLINICAL DATA:  59 year old Thomas Adams with cerebral infarcts, sepsis, bacteremia, respiratory failure. EXAM: PORTABLE CHEST 1 VIEW COMPARISON:  12/22/2018 and earlier. FINDINGS: Portable AP semi upright view at 0649 hours. Stable endotracheal  tube tip between the level the clavicles and carina. Enteric tube courses to the abdomen, tip not included. Stable right IJ central line. Mildly larger lung volumes and regressed retrocardiac opacity with air bronchograms. The left hemidiaphragm is visible today. Mildly asymmetric increased left lung interstitial opacity is stable. No pneumothorax or new pulmonary opacity. Normal cardiac size and mediastinal contours. Negative visible bowel gas pattern. IMPRESSION: 1. Stable lines and tubes. 2. Mildly larger lung volumes and regressed left lower lobe collapse or consolidation. 3. Asymmetric interstitial opacity in the left lung persists, favor infection over asymmetric edema. 4. No new cardiopulmonary abnormality. Electronically Signed   By: Genevie Ann M.D.   On: 12/23/2018 08:38   Dg Chest Port 1 View  Result Date: 12/22/2018 CLINICAL DATA:  59 year old Thomas Adams with cerebral infarcts, sepsis, staph bacteremia, respiratory failure. EXAM: PORTABLE CHEST 1 VIEW COMPARISON:  12/21/2018 and earlier. FINDINGS: Portable AP semi upright view at 0528 hours. ET tube in good position between the clavicles and carina. Enteric tube courses to the abdomen, side hole is at the level of the GE junction. Stable right IJ central line. Left lower lobe collapse or consolidation with some air bronchograms. Additional patchy left perihilar and medial right lung base opacity. No pneumothorax or pulmonary edema. No definite pleural effusion. No acute osseous abnormality identified. IMPRESSION: 1. Enteric tube side hole at the level of the GE junction, advance 5 cm to ensure side hole placement within the stomach. 2.  Otherwise stable lines and tubes. 3. Stable ventilation with left lower lobe  collapse or consolidation and patchy additional perihilar opacity. Electronically Signed   By: Genevie Ann M.D.   On: 12/22/2018 07:23   Dg Chest Port 1 View  Result Date: 12/21/2018 CLINICAL DATA:  Central line placement. EXAM: PORTABLE CHEST 1 VIEW COMPARISON:  12/12/2018 FINDINGS: Endotracheal tube with tip 3.1 cm above the carina. Interval advancement of the nasogastric tube which has tip just below the expected region of the gastroesophageal junction as this could be advanced another 5 cm. Right IJ central venous catheter has tip over the SVC. Lungs are adequately inflated demonstrate hazy opacification over the left lung which may be due to layering effusion with atelectasis. Subtle hazy opacification of the perihilar regions which may be due to mild interstitial edema. Cardiomediastinal silhouette and remainder of the exam is unchanged. IMPRESSION: Hazy opacification over the left lung which may be due to layering effusion with atelectasis. Mild hazy opacification of the perihilar regions which suggest mild interstitial edema. Tubes and lines as described. Electronically Signed   By: Marin Olp M.D.   On: 12/21/2018 11:39   Dg Chest Portable 1 View  Result Date: 01/10/2019 CLINICAL DATA:  Acute LEFT CVA. EXAM: PORTABLE CHEST 1 VIEW COMPARISON:  03/04/2016 radiographs FINDINGS: An endotracheal tube with tip 4 cm above the carina and OG tube with tip overlying the distal esophagus noted. UPPER limits normal heart size noted. There is no evidence of focal airspace disease, pulmonary edema, suspicious pulmonary nodule/mass, pleural effusion, or pneumothorax. No acute bony abnormalities are identified. IMPRESSION: Endotracheal tube with tip 4 cm above the carina. OG tube with tip overlying the distal esophagus-recommend advancement. No acute cardiopulmonary disease. Electronically Signed   By: Margarette Canada M.D.   On: 12/17/2018 14:45   Dg Abd Portable 1 View  Result Date: 12/24/2018 CLINICAL DATA:  OG  tube placement. EXAM: PORTABLE ABDOMEN - 1 VIEW COMPARISON:  None. FINDINGS: An OG tube is identified with tip overlying the distal  esophagus. Recommend advancement. The bowel gas pattern is unremarkable. IMPRESSION: OG tube with tip overlying the distal esophagus-recommend advancement. Electronically Signed   By: Margarette Canada M.D.   On: 01/07/2019 14:46   Ct Head Code Stroke Wo Contrast  Result Date: 12/16/2018 CLINICAL DATA:  Code stroke. Left-sided weakness, facial droop, and slurred speech. EXAM: CT HEAD WITHOUT CONTRAST TECHNIQUE: Contiguous axial images were obtained from the base of the skull through the vertex without intravenous contrast. COMPARISON:  Head CT 09/17/2013 FINDINGS: Brain: There is no evidence of acute infarct, intracranial hemorrhage, mass, midline shift, or extra-axial fluid collection. Patchy cerebral white matter hypodensities are similar to the prior CT and nonspecific but compatible with moderately age advanced chronic small vessel ischemic disease. Mild prominence of the lateral ventricles is also unchanged and may reflect central predominant cerebral atrophy. Vascular: Calcified atherosclerosis at the skull base. No hyperdense vessel. Skull: No fracture or focal osseous lesion. Sinuses/Orbits: Mild mucosal thickening in the maxillary sinuses. Clear mastoid air cells. Left cataract extraction. Other: None. ASPECTS [facility] Stroke Program Early CT Score) - Ganglionic level infarction (caudate, lentiform nuclei, internal capsule, insula, M1-M3 cortex): 7 - Supraganglionic infarction (M4-M6 cortex): 3 Total score (0-10 with 10 being normal): 10 IMPRESSION: 1. No evidence of acute intracranial abnormality. 2. ASPECTS is 10. 3. Moderate chronic small vessel ischemic disease. These results were communicated to Dr. Cheral Marker at 12:09 pm on 01/07/2019 by text page via the [facility] messaging system. Electronically Signed   By: Logan Bores M.D.   On: 01/04/2019 12:37   Ir Angio Intra Extracran  Sel Com Carotid Innominate Bilat Mod Sed  Result Date: 12/24/2018 CLINICAL DATA:  Left-sided facial droop, severe dysarthria, and left upper weakness. CT angiogram revealing occlusion of the right middle cerebral artery questionable M2 division. EXAM: IR ANGIO VERTEBRAL SEL SUBCLAVIAN INNOMINATE UNI RIGHT MOD SED; IR ANGIO VERTEBRAL SEL VERTEBRAL UNI LEFT MOD SED; BILATERAL COMMON CAROTID AND INNOMINATE ANGIOGRAPHY COMPARISON:  CT angiogram of the head and neck of 01/09/2019. MEDICATIONS: Heparin none units IV; Ancef 2 g IV antibiotic was administered within 1 hour of the procedure. ANESTHESIA/SEDATION: General anesthesia. CONTRAST:  Isovue 300 approximately 50 mL. FLUOROSCOPY TIME:  Fluoroscopy Time: 5 minutes 0 seconds (712 mGy). COMPLICATIONS: None immediate. TECHNIQUE: Informed written consent was obtained from the patient's mother after a thorough discussion of the procedural risks, benefits and alternatives. All questions were addressed. Maximal Sterile Barrier Technique was utilized including caps, mask, sterile gowns, sterile gloves, sterile drape, hand hygiene and skin antiseptic. A timeout was performed prior to the initiation of the procedure. The right groin was prepped and draped in the usual sterile fashion. Thereafter using modified Seldinger technique, transfemoral access into the right common femoral artery was obtained without difficulty. Over a 0.035 inch guidewire, a 5 French Pinnacle sheath was inserted. Through this, and also over 0.035 inch guidewire, a 5 [place] JB 1 catheter was advanced to the aortic arch region and selectively positioned in the right common carotid artery, , the right subclavian artery, the left common carotid artery and the left vertebral artery. FINDINGS: The right subclavian arteriogram demonstrates hypoplastic right vertebral artery with a normal origin. The vessel is seen to opacify to the cranial skull base where it opacifies the right vertebrobasilar junction and  the right posterior-inferior cerebellar artery. The opacified portion of the basilar artery is widely patent. The right common carotid arteriogram demonstrates the right external carotid artery and its major branches to be widely patent. The right internal carotid  artery at the bulb to the cranial skull base demonstrates wide patency. The petrous, cavernous and the supraclinoid segments are widely patent. The right middle cerebral artery and the right anterior cerebral artery opacify into the capillary and venous phases. There is a tapered severe stenosis followed by occlusion of a mid perisylvian branch of the superior division of the right middle cerebral artery in the M3 region. Extensive collaterals are seen retrogradely opacifying this distribution. Dural venous sinuses are widely patent. The left common carotid arteriogram demonstrates the left external carotid artery and its major branches to be widely patent. The left internal carotid artery at the bulb to the cranial skull base demonstrates wide patency. The petrous, cavernous and the supraclinoid segments are widely patent. The left middle cerebral artery demonstrates an early bifurcation a developmental variation, the left anterior cerebral artery opacifies with the left middle cerebral artery into the capillary and the venous phases. The left vertebral artery origin is widely patent. The vessel opacifies to the cranial skull base. Wide patency is seen of the left vertebrobasilar junction and the left posterior-inferior cerebellar artery which is hypoplastic. Suggestion of a left anterior-inferior cerebellar artery/posterior-inferior cerebellar artery complex is seen. The opacified portion of the basilar artery, the posterior cerebral arteries, the superior cerebellar arteries and the anterior-inferior cerebellar arteries is noted into the capillary and venous phases. Unopacified blood is seen in the basilar artery from the contralateral vertebral artery.  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 PLAN: Follow-up with the referring neurologist. Electronically Signed   By: Luanne Bras M.D.   On: 12/21/2018 13:24   Ir Angio Vertebral Sel Subclavian Innominate Uni R Mod Sed  Result Date: 12/24/2018 CLINICAL DATA:  Left-sided facial droop, severe dysarthria, and left upper weakness. CT angiogram revealing occlusion of the right middle cerebral artery questionable M2 division. EXAM: IR ANGIO VERTEBRAL SEL SUBCLAVIAN INNOMINATE UNI RIGHT MOD SED; IR ANGIO VERTEBRAL SEL VERTEBRAL UNI LEFT MOD SED; BILATERAL COMMON CAROTID AND INNOMINATE ANGIOGRAPHY COMPARISON:  CT angiogram of the head and neck of 12/13/2018. MEDICATIONS: Heparin none units IV; Ancef 2 g IV antibiotic was administered within 1 hour of the procedure. ANESTHESIA/SEDATION: General anesthesia. CONTRAST:  Isovue 300 approximately 50 mL. FLUOROSCOPY TIME:  Fluoroscopy Time: 5 minutes 0 seconds (712 mGy). COMPLICATIONS: None immediate. TECHNIQUE: Informed written consent was obtained from the patient's mother after a thorough discussion of the procedural risks, benefits and alternatives. All questions were addressed. Maximal Sterile Barrier Technique was utilized including caps, mask, sterile gowns, sterile gloves, sterile drape, hand hygiene and skin antiseptic. A timeout was performed prior to the initiation of the procedure. The right groin was prepped and draped in the usual sterile fashion. Thereafter using modified Seldinger technique, transfemoral access into the right common femoral artery was obtained without difficulty. Over a 0.035 inch guidewire, a 5 French Pinnacle sheath was inserted. Through this, and also over 0.035 inch guidewire, a 5 [place] JB 1 catheter was advanced to the aortic arch region and selectively positioned in the right common carotid artery, , the right  subclavian artery, the left common carotid artery and the left vertebral artery. FINDINGS: The right subclavian arteriogram demonstrates hypoplastic right vertebral artery with a normal origin. The vessel is seen to opacify to the cranial skull base where it opacifies the right vertebrobasilar junction and the right posterior-inferior cerebellar artery. The opacified portion of the basilar artery is widely patent.  The right common carotid arteriogram demonstrates the right external carotid artery and its major branches to be widely patent. The right internal carotid artery at the bulb to the cranial skull base demonstrates wide patency. The petrous, cavernous and the supraclinoid segments are widely patent. The right middle cerebral artery and the right anterior cerebral artery opacify into the capillary and venous phases. There is a tapered severe stenosis followed by occlusion of a mid perisylvian branch of the superior division of the right middle cerebral artery in the M3 region. Extensive collaterals are seen retrogradely opacifying this distribution. Dural venous sinuses are widely patent. The left common carotid arteriogram demonstrates the left external carotid artery and its major branches to be widely patent. The left internal carotid artery at the bulb to the cranial skull base demonstrates wide patency. The petrous, cavernous and the supraclinoid segments are widely patent. The left middle cerebral artery demonstrates an early bifurcation a developmental variation, the left anterior cerebral artery opacifies with the left middle cerebral artery into the capillary and the venous phases. The left vertebral artery origin is widely patent. The vessel opacifies to the cranial skull base. Wide patency is seen of the left vertebrobasilar junction and the left posterior-inferior cerebellar artery which is hypoplastic. Suggestion of a left anterior-inferior cerebellar artery/posterior-inferior cerebellar artery  complex is seen. The opacified portion of the basilar artery, the posterior cerebral arteries, the superior cerebellar arteries and the anterior-inferior cerebellar arteries is noted into the capillary and venous phases. Unopacified blood is seen in the basilar artery from the contralateral vertebral artery. 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 PLAN: Follow-up with the referring neurologist. Electronically Signed   By: Luanne Bras M.D.   On: 12/21/2018 13:24   Ir Angio Vertebral Sel Vertebral Uni L Mod Sed  Result Date: 12/24/2018 CLINICAL DATA:  Left-sided facial droop, severe dysarthria, and left upper weakness. CT angiogram revealing occlusion of the right middle cerebral artery questionable M2 division. EXAM: IR ANGIO VERTEBRAL SEL SUBCLAVIAN INNOMINATE UNI RIGHT MOD SED; IR ANGIO VERTEBRAL SEL VERTEBRAL UNI LEFT MOD SED; BILATERAL COMMON CAROTID AND INNOMINATE ANGIOGRAPHY COMPARISON:  CT angiogram of the head and neck of 12/15/2018. MEDICATIONS: Heparin none units IV; Ancef 2 g IV antibiotic was administered within 1 hour of the procedure. ANESTHESIA/SEDATION: General anesthesia. CONTRAST:  Isovue 300 approximately 50 mL. FLUOROSCOPY TIME:  Fluoroscopy Time: 5 minutes 0 seconds (712 mGy). COMPLICATIONS: None immediate. TECHNIQUE: Informed written consent was obtained from the patient's mother after a thorough discussion of the procedural risks, benefits and alternatives. All questions were addressed. Maximal Sterile Barrier Technique was utilized including caps, mask, sterile gowns, sterile gloves, sterile drape, hand hygiene and skin antiseptic. A timeout was performed prior to the initiation of the procedure. The right groin was prepped and draped in the usual sterile fashion. Thereafter using modified Seldinger technique, transfemoral access into the right common femoral  artery was obtained without difficulty. Over a 0.035 inch guidewire, a 5 French Pinnacle sheath was inserted. Through this, and also over 0.035 inch guidewire, a 5 [place] JB 1 catheter was advanced to the aortic arch region and selectively positioned in the right common carotid artery, , the right subclavian artery, the left common carotid artery and the left vertebral artery. FINDINGS: The right subclavian arteriogram demonstrates hypoplastic right vertebral artery with a normal origin. The vessel is seen to opacify to the cranial skull base where  it opacifies the right vertebrobasilar junction and the right posterior-inferior cerebellar artery. The opacified portion of the basilar artery is widely patent. The right common carotid arteriogram demonstrates the right external carotid artery and its major branches to be widely patent. The right internal carotid artery at the bulb to the cranial skull base demonstrates wide patency. The petrous, cavernous and the supraclinoid segments are widely patent. The right middle cerebral artery and the right anterior cerebral artery opacify into the capillary and venous phases. There is a tapered severe stenosis followed by occlusion of a mid perisylvian branch of the superior division of the right middle cerebral artery in the M3 region. Extensive collaterals are seen retrogradely opacifying this distribution. Dural venous sinuses are widely patent. The left common carotid arteriogram demonstrates the left external carotid artery and its major branches to be widely patent. The left internal carotid artery at the bulb to the cranial skull base demonstrates wide patency. The petrous, cavernous and the supraclinoid segments are widely patent. The left middle cerebral artery demonstrates an early bifurcation a developmental variation, the left anterior cerebral artery opacifies with the left middle cerebral artery into the capillary and the venous phases. The left vertebral artery  origin is widely patent. The vessel opacifies to the cranial skull base. Wide patency is seen of the left vertebrobasilar junction and the left posterior-inferior cerebellar artery which is hypoplastic. Suggestion of a left anterior-inferior cerebellar artery/posterior-inferior cerebellar artery complex is seen. The opacified portion of the basilar artery, the posterior cerebral arteries, the superior cerebellar arteries and the anterior-inferior cerebellar arteries is noted into the capillary and venous phases. Unopacified blood is seen in the basilar artery from the contralateral vertebral artery. 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 PLAN: Follow-up with the referring neurologist. Electronically Signed   By: Luanne Bras M.D.   On: 12/21/2018 13:24     PHYSICAL EXAM:     Temp:  [97.7 F (36.5 C)-99.1 F (37.3 C)] 99.1 F (37.3 C) (05/18 1145) Pulse Rate:  [88-140] 106 (05/18 1141) Resp:  [8-28] 22 (05/18 1141) BP: (73-142)/(42-106) 114/69 (05/18 1141) SpO2:  [91 %-100 %] 93 % (05/18 1142) FiO2 (%):  [40 %-100 %] 80 % (05/18 1142)  General - Well nourished, well developed, intubated on sedation.  Ophthalmologic - fundi not visualized due to noncooperation.  Cardiovascular - Regular rhythm, persistent tachycardia.  Neuro - intubated on sedation, eyes open spontaneously, following simple commands with eye opening close, and right hand showing fingers, making fist, did not following commands with mouth open and tongue protrusion. Eyes in mid position, limited movement to the right gaze but no left gaze, no tracking, inconsistent blinking to visual threat, pupil 27mm bilaterally. Corneal reflex positive bilaterally, gag and cough absent. Breathing over the vent.  Facial symmetry not able to test due to ET tube.  Tongue midline in mouth.  Right arm 4/5 against gravity,  able to follow commands with right fingers.  On pain stimulation, no movement in LUE and BLEs. DTR  diminished and no babinski. Sensation, coordination and gait not tested.    ASSESSMENT/PLAN Thomas Adams is a 58 y.o. Thomas Adams with history of atrial fibrillation not on AC, HTN and ESRD on HD presenting with left hemiparesis and left facial droop with dysarthria. Sent to IR but no correctable lesion.  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 Small vessel disease. ASPECTS 10.     CTA head & neck suspected mid R M2 branch occlusion w/ 49mL penumbra R parietal. No LVO. Possible severe L MCA origin stenosis. Aortic atherosclerosis   CT perfusion no core  Cerebral angio RT MCA   Sup division M3 branch tapered occlusion. No intervention  MRI  Multifocal B supratentorial infarcts.   2D Echo EF 65%. There are severe posterior mitral annular calcifications extending into the mitral lefalets with a mobile portion that most probably represents a large calcified mass partially detached from the leaflet. This can possibly be a source of embolism and stroke.  HIV negative   TEE not indicated per cardiology as meeting dx criteria for endocarditis  LDL UTC d/t high TG 691  HgbA1c 5.5  SCDs for VTE prophylaxis. Hold Heparin given severe anemia   No antithrombotic prior to admission, now on aspirin 81 mg daily. Not an annticoagulation candidate due to endocarditis, risk of bleeding and severe anemia at this time. Consider AC if appropriate after completing 6 wk abx course. Hold off aspirin 81 now given severe anemia requiring PRBC.   Therapy recommendations:  pending   Disposition:  pending   Acute Hypoxic Respiratory Failure, compromised airway  Intubated initially for IR  CXR worsening bilateral airspace disease  Remains on sedation, agitated now - on fentanyl, precedex,  added ativan  Not a candidate for extubation  PCCM on board  Sepsis w/ MSSA bacteremia in setting of new severe MR with mobile mass, likely endocarditis  WBC 18.7->22.9-> 34.8->48.5  Temp in ED 103, TMax past 24h 100.4->99.3->97.9->99.1  On nafcillin, plan treatment x 6 weeks  2D Echo severe posterior mitral annular calcifications extending into the mitral lefalets with a mobile portion that most probably represents a large calcified mass partially detached from the leaflet.   Repeat Blood cultures - negative  UCx no growth   Lactic acid 3.3  ID on board  Cardiology signed off  Atrial Fibrillation w/ RVR . Home anticoagulation:  none  . On Amiodarone   . HR 80-120s . Cardiology signed off . Not on metoprolol given hypotension . Not an AC candidate d/t endocarditis and risk for bleeding - consider in future after abx treatment is completed in 6 weeks.     Hypotension in setting of sepsis  Hypertensive Emergency on admission   BP as high as 174/139 on admission  Home meds:  Norvasc 10, hydralazine 25 tid, labetalol 100 bid, lisinopril 40  BP low this AM 40-98 systolic  On levophed  . BP goal normotensive  Severe anemia   Hb 9.0-8.8-7.7-6.9-> 1u PRBC->8.2->8.9->6.8-> PRBC -> 8.0->8.1  Likely due to ESRD vs. Acute blood loss  ASA on hold for now  Close monitoring  Hyperlipidemia  Home meds:  No statin  LDL UTC d/t high TG 691, goal < 70  Recheck lipids once off lipid based drips  Consider statin if LDL remains elevated at that time  Dysphagia  On vent  TF per CCM  NPO  Other Stroke Risk Factors  Overweight, Body mass index is 31.25 kg/m., recommend weight loss, diet and exercise as appropriate   Other Active Problems  ESRD on HD TTS. Nephrology on board. Continued on CRRT  Secondary hyperparathyroidism   Thrombocytopenia in setting of sepsis [id number]   INR 1.7->1.5->pending  Hospital day # 8   This patient is  critically ill and at significant risk of neurological worsening,  death and care requires constant monitoring of vital signs, hemodynamics, respiratory and cardiac monitoring, extensive review of multiple databases, frequent neurological assessment, discussion with CCM team and medical decision making of high complexity. I spent 35 minutes of neurocritical care time  in the care of  this patient.  Rosalin Hawking, MD PhD Stroke Neurology 12/28/2018 12:16 PM   To contact Stroke Continuity provider, please refer to [email or web address] After hours, contact General Neurology

## 2018-12-28 NOTE — Progress Notes (Signed)
NAME:  Thomas Adams, MRN:  [id number], DOB:  06-06-1961, LOS: 8 ADMISSION DATE:  01/10/2019, CONSULTATION DATE:  12/28/18 REFERRING MD:  Dr. Germaine Pomfret , CHIEF COMPLAINT:  Left side weakness   Brief History   58 yr old with history of HTN, afib, febrile and coughing x 1 d, found with left facial droop, left sided weakness 5/10. 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 Brought to ICU intubated  Past Medical History  HTN ESRD, dialysis TThSat Afib w/ RVR  Significant Hospital Events   5/10 Angiogram in IR >> no clot retraction due to distal location of thrombus; hypotension from meds 5/11 d/c diprivan due to triglyceride levels 5/12 start CRRT; start amiodarone 5/14 Off pressors, on CRRT 5/17 difficult day yesterday, resume CRRT on pressors-being weaned 5/18 no significant changes overnight  Consults:  Nephrology  ID Cardiology   Procedures:  ETT 5/10 >>  Rt radial aline 5/10 >>  Rt IJ HD cath 5/11 >>  Left IJ triple-lumen catheter insertion 5/16  Significant Diagnostic Tests:  CT angio head/neck 5/10 >> Rt M2 occlusion, severe Lt MCA stenosis Echo 5/10 >> EF greater than 65%, severe LCH, severe MR MRI brain 5/11 >> multifocal acute ischemia scattered in both cerebral and cerebellar hemispheres  5/17-chest x-ray revealed improvement in congestion/infiltrate Micro Data:  COVID 5/10 >> negative Blood 5/10 >> MSSA  Antimicrobials:  Cefepime 5/10 >> 5/11 Vancomycin 5/10 >> 5/11 Ancef 5/11 >>5/13 Nafcillin 5/13 >  Subjective:   Remains critically ill, on vent, on pressure support On CRRT On Precedex and fentanyl, agitated on occasions  Objective   Blood pressure (!) 117/56, pulse (!) 116, temperature 99.1 F (37.3 C), temperature source Axillary, resp. rate 18, height 5\' 9"  (1.753 m), weight 96 kg, SpO2 99 %.    Vent Mode: PCV FiO2 (%):  [40 %-100 %] 80 % Set Rate:  [18 bmp] 18 bmp PEEP:  [10 cmH20]  10 cmH20 Plateau Pressure:  [11 cmH20-25 cmH20] 11 cmH20   Intake/Output Summary (Last 24 hours) at 12/28/2018 7048 Last data filed at 12/28/2018 0800 Gross per 24 hour  Intake 4022.34 ml  Output 4811 ml  Net -788.66 ml   Filed Weights   01/07/2019 1500 12/25/18 0440 12/27/18 0500  Weight: 85.3 kg 96.1 kg 96 kg    Examination: Middle-age male, does not appear to be in acute distress Endotracheal tube in place S1-S2 appreciated, no JVD Abdomen is soft, bowel sounds appreciated Breath sounds decreased bilaterally but clear Bilateral lower extremity edema Skin is warm and dry Spontaneous movement of extremities  Resolved Hospital Problem list     Assessment & Plan:   Acute respiratory failure due to airway compromise, in the setting of CVA Pulmonary edema-improving -Continue Precedex and fentanyl for sedation -Breathing trials as tolerated -Still with multiple active issues, not a candidate for extubation at present -Continue ventilator support -on pressure control ventilation at present  Septic shock, 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 -Nafcillin per ID, 6 weeks of treatment intubated -Trend CBC-worsening  CVA -Thrombectomy attempted by IR-unsuccessful -Neurology following -Start aspirin  End-stage renal disease -Nephrology following -On CRRT  Agitation -Continue Precedex with RA SS goal of 0 to -1  Anemia of critical illness Thrombocytopenia -Trend CBC -Monitor H&H-status post 1 unit transfusion 5/17  Infective endocarditis Mitral regurgitation Atrial fibrillation -6 weeks of antibiotics -TEE if surgery planned -Amiodarone for atrial fibrillation  Obtain chest x-ray today Contact ID regarding worsening leukocytosis-done  Best practice:  Diet: Continue tube feeds DVT prophylaxis: SCDs, on heparin GI prophylaxis: Protonix Mobility: Bedrest Code Status: Full code Disposition: ICU  Multiple comorbidities precludes extubation at present Still gets  very agitated off sedation Continuing on CRRT  The patient is critically ill with multiple organ system failure and requires high complexity decision making for assessment and support, frequent evaluation and titration of therapies, advanced monitoring, review of radiographic studies and interpretation of complex data.    Critical Care Time devoted to patient care services, exclusive of separately billable procedures, described in this note is 35 minutes.  Sherrilyn Rist, MD [phone]

## 2018-12-28 NOTE — Progress Notes (Signed)
Subjective:  Stabilized some again- currently off pressors- HR still high  Objective Vital signs in last 24 hours: Vitals:   12/28/18 0745 12/28/18 0747 12/28/18 0752 12/28/18 0800  BP: 93/74   (!) 117/56  Pulse: (!) 124     Resp: 18   18  Temp:   99.1 F (37.3 C)   TempSrc:   Axillary   SpO2: 96% 99%    Weight:      Height:       Weight change:   Intake/Output Summary (Last 24 hours) at 12/28/2018 5462 Last data filed at 12/28/2018 0800 Gross per 24 hour  Intake 4022.34 ml  Output 4811 ml  Net -788.66 ml   HD orders- [facility]- 4 hours 450 BFR- left AVF - EDW 88 2/2 heparin - yes  parsabiv 5, calcitriol 1.5 Last hgb 10.9- calc 9.0- phos 6.9- pth 594   Assessment/ Plan: Pt is a 58 y.o. yo male who was admitted on 01/07/2019 with an acute ischemic stroke , then found to have an MSSA bacteremia   Assessment/Plan: 1. Acute CVA- supportive care- c/w  septic emboli-  2. MSSA bacteremia- on nafcillin - ID following- 6 weeks total abx for presumed IE, WBC still quite high -   AVF not felt to be source of infection.  ID says can be changed to vanc at discharge so abx can be given at OP center.  CTS to consult now 3. ESRD-  Normally TTS  - CRRT from 5/12 to 5/14-  more unstable again-  resumption of CRRT on 5/16- all 4 K bath.  Given his overall instability especially resp wise will just keep on CRRT   4. Secondary hyperparathyroidism- calcium is OK - Phos is OK - no binder  5. HTN/volume- volume and BP seem OK- no BP meds- run even  6. Anemia- hgb trending down - also platelets down- now on some heparin thru CRRT - needing to prevent clotting- on Aranesp.  Given one unit on 5/15 as well.   7. VDRF-  Has taken a step back for unclear reasons, cont excellent care per [place]: Basic Metabolic Panel: Recent Labs  Lab 12/27/18 0500 12/27/18 1550 12/28/18 0616  NA 138 138 137  K 4.4 4.6 4.3  CL 104 104 104  CO2 _0 GLUCOSE 130* 147* 121*  BUN 87*  77* 55*  CREATININE 4.18* 3.63* 2.82*  CALCIUM 8.8* 8.8* 8.7*  PHOS 4.4 5.1* 4.0   Liver Function Tests: Recent Labs  Lab 12/27/18 0500 12/27/18 1550 12/28/18 0616  ALBUMIN 1.6* 1.7* 1.7*   Recent Labs  Lab 12/22/18 0945  LIPASE 30   No results for input(s): AMMONIA in the last 168 hours. CBC: Recent Labs  Lab 12/24/18 0540 12/25/18 0500  12/26/18 0738 12/27/18 1108 12/27/18 1715 12/28/18 0616  WBC 22.9* 22.9*  --  34.8* 29.9*  --  48.5*  HGB 7.7* 6.9*   < > 8.9*  8.2* 6.8* 8.0* 8.1*  HCT 22.6* 19.9*   < > 26.7*  24.0* 20.0* 24.3* 24.3*  MCV 92.6 91.7  --  92.7 89.7  --  92.4  PLT 83* 121*  --  183 135*  --  144*   < > = values in this interval not displayed.   Cardiac Enzymes: No results for input(s): CKTOTAL, CKMB, CKMBINDEX, TROPONINI in the last 168 hours. CBG: Recent Labs  Lab 12/27/18 1535 12/27/18 1929 12/27/18 2309 12/28/18 0308 12/28/18  0726  GLUCAP 96 122* 124* 111* 96    Iron Studies: No results for input(s): IRON, TIBC, TRANSFERRIN, FERRITIN in the last 72 hours. Studies/Results: Dg Chest Port 1 View  Result Date: 12/27/2018 CLINICAL DATA:  Intubated EXAM: PORTABLE CHEST 1 VIEW COMPARISON:  One-view chest x-ray 12/26/2018 FINDINGS: Heart size is normal. Endotracheal tube terminates 5.5 cm above the carina, in satisfactory position. Left IJ line and right IJ sheath are stable. NG tube courses off the inferior border of the film. There is slight improved aeration. Patchy interstitial and airspace disease is again noted bilaterally. IMPRESSION: 1. Support apparatus stable. 2. Slight improved aeration of both lungs. 3. Persistent diffuse interstitial and airspace disease. Electronically Signed   By: San Morelle M.D.   On: 12/27/2018 08:30   Dg Chest Port 1 View  Result Date: 12/26/2018 CLINICAL DATA:  Encounter for central line placement. EXAM: PORTABLE CHEST 1 VIEW COMPARISON:  12/26/2018 FINDINGS: There is a right IJ catheter with tip in  the projection of the SVC. Left IJ catheter has been recently placed and tip is projecting over the cavoatrial junction. ET tube tip is above the carina. There is an enteric tube with tip just below the GE junction. Normal heart size. Diffuse bilateral pulmonary opacities are again noted and appear unchanged from previous exam IMPRESSION: 1. Interval placement of a left IJ catheter with tip projecting over the cavoatrial junction. No pneumothorax identified. 2. No change in aeration to lungs compared with previous exam. Electronically Signed   By: Kerby Moors M.D.   On: 12/26/2018 18:05   Medications: Infusions: .  prismasol BGK 4/2.5 300 mL/hr at 12/27/18 2238  .  prismasol BGK 4/2.5 300 mL/hr at 12/27/18 2308  . sodium chloride Stopped (12/27/18 0427)  . dexmedetomidine (PRECEDEX) IV infusion 1.2 mcg/kg/hr (12/28/18 0800)  . feeding supplement (VITAL AF 1.2 CAL) 40 mL/hr at 12/27/18 0700  . fentaNYL infusion INTRAVENOUS 250 mcg/hr (12/28/18 0800)  . heparin 10,000 units/ 20 mL infusion syringe 1,750 Units/hr (12/28/18 0614)  . nafcillin IV Stopped (12/28/18 0037)  . norepinephrine (LEVOPHED) Adult infusion Stopped (12/27/18 2103)  . prismasol BGK 4/2.5 2,000 mL/hr at 12/28/18 0421    Scheduled Medications: . amiodarone  200 mg Per Tube BID  . chlorhexidine gluconate (MEDLINE KIT)  15 mL Mouth Rinse BID  . Chlorhexidine Gluconate Cloth  6 each Topical Daily  . Chlorhexidine Gluconate Cloth  6 each Topical Q0600  . darbepoetin (ARANESP) injection - DIALYSIS  150 mcg Intravenous Q Thu-HD  . feeding supplement (PRO-STAT SUGAR FREE 64)  60 mL Per Tube QID  . mouth rinse  15 mL Mouth Rinse 10 times per day  . pantoprazole sodium  40 mg Per Tube Q24H  . sodium chloride flush  10-40 mL Intracatheter Q12H  . sodium chloride flush  3 mL Intravenous Once    have reviewed scheduled and prn medications.  Physical Exam: General: sedated on vent  Heart: tachy Lungs: mostly clear Abdomen:  soft, non tender Extremities: really no edema Dialysis Access: left AVF patent- also with right IJ temp HD cath for CRRT  - placed 5/12-    12/28/2018,8:12 AM  LOS: 8 days

## 2018-12-29 ENCOUNTER — Encounter (HOSPITAL_COMMUNITY): Payer: Self-pay | Admitting: Infectious Diseases

## 2018-12-29 ENCOUNTER — Inpatient Hospital Stay (HOSPITAL_COMMUNITY): Payer: Medicare Other

## 2018-12-29 DIAGNOSIS — I499 Cardiac arrhythmia, unspecified: Secondary | ICD-10-CM

## 2018-12-29 DIAGNOSIS — N186 End stage renal disease: Secondary | ICD-10-CM

## 2018-12-29 DIAGNOSIS — Z515 Encounter for palliative care: Secondary | ICD-10-CM

## 2018-12-29 DIAGNOSIS — I340 Nonrheumatic mitral (valve) insufficiency: Secondary | ICD-10-CM

## 2018-12-29 DIAGNOSIS — R509 Fever, unspecified: Secondary | ICD-10-CM

## 2018-12-29 DIAGNOSIS — D72829 Elevated white blood cell count, unspecified: Secondary | ICD-10-CM

## 2018-12-29 DIAGNOSIS — Z8673 Personal history of transient ischemic attack (TIA), and cerebral infarction without residual deficits: Secondary | ICD-10-CM

## 2018-12-29 DIAGNOSIS — Z7189 Other specified counseling: Secondary | ICD-10-CM

## 2018-12-29 DIAGNOSIS — Z933 Colostomy status: Secondary | ICD-10-CM

## 2018-12-29 DIAGNOSIS — Z992 Dependence on renal dialysis: Secondary | ICD-10-CM

## 2018-12-29 DIAGNOSIS — I639 Cerebral infarction, unspecified: Secondary | ICD-10-CM

## 2018-12-29 DIAGNOSIS — D631 Anemia in chronic kidney disease: Secondary | ICD-10-CM

## 2018-12-29 LAB — BPAM RBC
Blood Product Expiration Date: 202005272359
Blood Product Expiration Date: 202006192359
ISSUE DATE / TIME: 202005151136
ISSUE DATE / TIME: 202005171258
Unit Type and Rh: 5100
Unit Type and Rh: 5100

## 2018-12-29 LAB — GLUCOSE, CAPILLARY
Glucose-Capillary: 116 mg/dL — ABNORMAL HIGH (ref 70–99)
Glucose-Capillary: 118 mg/dL — ABNORMAL HIGH (ref 70–99)
Glucose-Capillary: 119 mg/dL — ABNORMAL HIGH (ref 70–99)
Glucose-Capillary: 131 mg/dL — ABNORMAL HIGH (ref 70–99)
Glucose-Capillary: 94 mg/dL (ref 70–99)
Glucose-Capillary: 95 mg/dL (ref 70–99)

## 2018-12-29 LAB — RENAL FUNCTION PANEL
Albumin: 1.6 g/dL — ABNORMAL LOW (ref 3.5–5.0)
Albumin: 1.6 g/dL — ABNORMAL LOW (ref 3.5–5.0)
Anion gap: 11 (ref 5–15)
Anion gap: 11 (ref 5–15)
BUN: 43 mg/dL — ABNORMAL HIGH (ref 6–20)
BUN: 41 mg/dL — ABNORMAL HIGH (ref 6–20)
CO2: 25 mmol/L (ref 22–32)
CO2: 23 mmol/L (ref 22–32)
Calcium: 8.8 mg/dL — ABNORMAL LOW (ref 8.9–10.3)
Calcium: 9 mg/dL (ref 8.9–10.3)
Chloride: 102 mmol/L (ref 98–111)
Chloride: 105 mmol/L (ref 98–111)
Creatinine, Ser: 2.15 mg/dL — ABNORMAL HIGH (ref 0.61–1.24)
Creatinine, Ser: 2.22 mg/dL — ABNORMAL HIGH (ref 0.61–1.24)
GFR calc Af Amer: 39 mL/min — ABNORMAL LOW (ref >60)
GFR calc Af Amer: 37 mL/min — ABNORMAL LOW (ref >60)
GFR calc non Af Amer: 33 mL/min — ABNORMAL LOW (ref >60)
GFR calc non Af Amer: 32 mL/min — ABNORMAL LOW (ref >60)
Glucose, Bld: 142 mg/dL — ABNORMAL HIGH (ref 70–99)
Glucose, Bld: 122 mg/dL — ABNORMAL HIGH (ref 70–99)
Phosphorus: 3.6 mg/dL (ref 2.5–4.6)
Phosphorus: 3.7 mg/dL (ref 2.5–4.6)
Potassium: 4.4 mmol/L (ref 3.5–5.1)
Potassium: 5 mmol/L (ref 3.5–5.1)
Sodium: 138 mmol/L (ref 135–145)
Sodium: 139 mmol/L (ref 135–145)

## 2018-12-29 LAB — HEPATIC FUNCTION PANEL
ALT: 9 U/L (ref 0–44)
AST: 31 U/L (ref 15–41)
Albumin: 1.7 g/dL — ABNORMAL LOW (ref 3.5–5.0)
Alkaline Phosphatase: 114 U/L (ref 38–126)
Bilirubin, Direct: 0.4 mg/dL — ABNORMAL HIGH (ref 0.0–0.2)
Indirect Bilirubin: 1.9 mg/dL — ABNORMAL HIGH (ref 0.3–0.9)
Total Bilirubin: 2.3 mg/dL — ABNORMAL HIGH (ref 0.3–1.2)
Total Protein: 6.7 g/dL (ref 6.5–8.1)

## 2018-12-29 LAB — TYPE AND SCREEN
ABO/RH(D): O POS
Antibody Screen: NEGATIVE
Unit division: 0
Unit division: 0

## 2018-12-29 LAB — C DIFFICILE QUICK SCREEN W PCR REFLEX
C Diff antigen: NEGATIVE
C Diff interpretation: NOT DETECTED
C Diff toxin: NEGATIVE

## 2018-12-29 LAB — POCT ACTIVATED CLOTTING TIME
Activated Clotting Time: 197 seconds
Activated Clotting Time: 202 seconds
Activated Clotting Time: 202 seconds

## 2018-12-29 LAB — CBC
HCT: 21.9 % — ABNORMAL LOW (ref 39.0–52.0)
HCT: 21.6 % — ABNORMAL LOW (ref 39.0–52.0)
Hemoglobin: 7.1 g/dL — ABNORMAL LOW (ref 13.0–17.0)
Hemoglobin: 7 g/dL — ABNORMAL LOW (ref 13.0–17.0)
MCH: 31 pg (ref 26.0–34.0)
MCH: 31 pg (ref 26.0–34.0)
MCHC: 32.4 g/dL (ref 30.0–36.0)
MCHC: 32.4 g/dL (ref 30.0–36.0)
MCV: 95.6 fL (ref 80.0–100.0)
MCV: 95.6 fL (ref 80.0–100.0)
Platelets: 147 10*3/uL — ABNORMAL LOW (ref 150–400)
Platelets: 147 10*3/uL — ABNORMAL LOW (ref 150–400)
RBC: 2.29 MIL/uL — ABNORMAL LOW (ref 4.22–5.81)
RBC: 2.26 MIL/uL — ABNORMAL LOW (ref 4.22–5.81)
RDW: 17.5 % — ABNORMAL HIGH (ref 11.5–15.5)
RDW: 17.6 % — ABNORMAL HIGH (ref 11.5–15.5)
WBC: 46.1 10*3/uL — ABNORMAL HIGH (ref 4.0–10.5)
WBC: 45.8 10*3/uL — ABNORMAL HIGH (ref 4.0–10.5)
nRBC: 0.2 % (ref 0.0–0.2)
nRBC: 0.4 % — ABNORMAL HIGH (ref 0.0–0.2)

## 2018-12-29 LAB — APTT: aPTT: 172 seconds (ref 24–36)

## 2018-12-29 LAB — PROTIME-INR
INR: 1.5 — ABNORMAL HIGH (ref 0.8–1.2)
Prothrombin Time: 18.4 seconds — ABNORMAL HIGH (ref 11.4–15.2)

## 2018-12-29 LAB — MAGNESIUM: Magnesium: 2.7 mg/dL — ABNORMAL HIGH (ref 1.7–2.4)

## 2018-12-29 MED ORDER — LOPERAMIDE HCL 1 MG/7.5ML PO SUSP
2.0000 mg | Freq: Two times a day (BID) | ORAL | Status: DC | PRN
  Administered 2018-12-30 – 2018-12-31 (×3): 2 mg
  Filled 2018-12-29 (×3): qty 15

## 2018-12-29 MED ORDER — QUETIAPINE FUMARATE 25 MG PO TABS
50.0000 mg | ORAL_TABLET | Freq: Two times a day (BID) | ORAL | Status: DC
  Administered 2018-12-29 – 2018-12-30 (×4): 50 mg
  Filled 2018-12-29 (×4): qty 2

## 2018-12-29 MED ORDER — LOPERAMIDE HCL 1 MG/7.5ML PO SUSP
4.0000 mg | Freq: Once | ORAL | Status: AC
  Administered 2018-12-29: 4 mg
  Filled 2018-12-29: qty 30

## 2018-12-29 NOTE — Progress Notes (Signed)
Subjective:  Back on levophed gtt, I/O net neg 0.4L yesterday  Objective Vital signs in last 24 hours: Vitals:   12/29/18 0830 12/29/18 0845 12/29/18 0900 12/29/18 0906  BP: (!) 81/51 117/85 107/66   Pulse:      Resp: '18 14 19   ' Temp:      TempSrc:      SpO2:    90%  Weight:      Height:       Weight change:   Intake/Output Summary (Last 24 hours) at 12/29/2018 1006 Last data filed at 12/29/2018 0900 Gross per 24 hour  Intake 4221.84 ml  Output 4746 ml  Net -524.16 ml   [facility]  4h  450  88kg  2/2 bath  Hep yes   L AVF parsabiv 5, calcitriol 1.5 Last hgb 10.9- calc 9.0- phos 6.9- pth 594   Summary: Pt is a 58 y.o. yo male who was admitted on 12/17/2018 with an acute ischemic stroke , then found to have an MSSA bacteremia    Assessment/Plan: 1. Acute CVA- supportive care- c/w  septic emboli 2. MSSA bacteremia- on nafcillin - ID following- 6 weeks total abx for presumed IE, WBC still quite high. AVF not felt to be source of infection.  Per ID can switch to vanc IV w/ HD at dc. 3. ESRD-  Normally TTS  - CRRT from 5/12 to 5/14, then restarted on 5/16. Cont CRRT as still unstable   4. Secondary hyperparathyroidism- calcium is OK - Phos is OK - no binder  5. HTN/volume- +8 kg from admit wt, +mild edema on exam 6. Anemia- hgb trending down - also platelets down- now on some heparin thru CRRT - needing to prevent clotting- on Aranesp.  Given one unit on 5/15 as well.   7. VDRF- diffuse bilat infiltrates on CXR, unable to pull fluid as on pressor support now  [facility] 12/29/2018, 10:12 AM    Labs: Basic Metabolic Panel: Recent Labs  Lab 12/28/18 0616 12/28/18 1507 12/29/18 0500  NA 137 139 138  K 4.3 4.7 4.4  CL 104 103 102  CO2 '23 25 25  ' GLUCOSE 121* 126* 142*  BUN 55* 50* 43*  CREATININE 2.82* 2.61* 2.15*  CALCIUM 8.7* 8.7* 8.8*  PHOS 4.0 4.9* 3.6   Liver Function Tests: Recent Labs  Lab 12/28/18 0616 12/28/18 1507 12/29/18 0500   AST  --   --  31  ALT  --   --  9  ALKPHOS  --   --  114  BILITOT  --   --  2.3*  PROT  --   --  6.7  ALBUMIN 1.7* 1.7* 1.7*  1.6*   No results for input(s): LIPASE, AMYLASE in the last 168 hours. No results for input(s): AMMONIA in the last 168 hours. CBC: Recent Labs  Lab 12/25/18 0500  12/26/18 0738 12/27/18 1108 12/27/18 1715 12/28/18 0616 12/29/18 0500  WBC 22.9*  --  34.8* 29.9*  --  48.5* 46.1*  HGB 6.9*   < > 8.9*  8.2* 6.8* 8.0* 8.1* 7.1*  HCT 19.9*   < > 26.7*  24.0* 20.0* 24.3* 24.3* 21.9*  MCV 91.7  --  92.7 89.7  --  92.4 95.6  PLT 121*  --  183 135*  --  144* 147*   < > = values in this interval not displayed.   Cardiac Enzymes: No results for input(s): CKTOTAL, CKMB, CKMBINDEX, TROPONINI in the last 168 hours. CBG:  Recent Labs  Lab 12/28/18 1537 12/28/18 1933 12/28/18 2326 12/29/18 0324 12/29/18 0747  GLUCAP 90 97 107* 119* 131*    Iron Studies: No results for input(s): IRON, TIBC, TRANSFERRIN, FERRITIN in the last 72 hours. Studies/Results: Dg Chest Port 1 View  Result Date: 12/28/2018 CLINICAL DATA:  Respiratory failure. EXAM: PORTABLE CHEST 1 VIEW COMPARISON:  12/27/2018 FINDINGS: Endotracheal tube terminates 4 cm above the carina. Bilateral jugular catheters are unchanged. An enteric tube courses into the upper abdomen. The cardiac silhouette is borderline enlarged. There is aortic atherosclerosis. Interstitial and patchy airspace opacities throughout both lungs have mildly to moderately increased in the interim. No sizable pleural effusion or pneumothorax is identified with note made incomplete imaging of the left costophrenic angle. IMPRESSION: Worsening bilateral airspace disease. Electronically Signed   By: Logan Bores M.D.   On: 12/28/2018 09:13   Medications: Infusions: .  prismasol BGK 4/2.5 300 mL/hr at 12/29/18 0916  .  prismasol BGK 4/2.5 300 mL/hr at 12/29/18 0921  . sodium chloride Stopped (12/27/18 0427)  . dexmedetomidine  (PRECEDEX) IV infusion 1.4 mcg/kg/hr (12/29/18 0900)  . feeding supplement (VITAL AF 1.2 CAL) 1,000 mL (12/29/18 0500)  . fentaNYL infusion INTRAVENOUS 300 mcg/hr (12/29/18 0900)  . heparin 10,000 units/ 20 mL infusion syringe 1,750 Units/hr (12/29/18 0928)  . nafcillin IV Stopped (12/29/18 4259)  . norepinephrine (LEVOPHED) Adult infusion 4 mcg/min (12/29/18 0900)  . prismasol BGK 4/2.5 2,000 mL/hr at 12/29/18 5638    Scheduled Medications: . amiodarone  200 mg Per Tube BID  . chlorhexidine gluconate (MEDLINE KIT)  15 mL Mouth Rinse BID  . Chlorhexidine Gluconate Cloth  6 each Topical Daily  . darbepoetin (ARANESP) injection - DIALYSIS  150 mcg Intravenous Q Thu-HD  . feeding supplement (PRO-STAT SUGAR FREE 64)  60 mL Per Tube QID  . mouth rinse  15 mL Mouth Rinse 10 times per day  . pantoprazole sodium  40 mg Per Tube Q24H  . QUEtiapine  50 mg Per Tube BID  . sodium chloride flush  10-40 mL Intracatheter Q12H    have reviewed scheduled and prn medications.  Physical Exam: General: sedated on vent  Heart: tachy Lungs: mostly clear Abdomen: soft, non tender Extremities: really no edema Dialysis Access: left AVF patent- also with right IJ temp HD cath for CRRT  - placed 5/12-    12/29/2018,10:06 AM  LOS: 9 days

## 2018-12-29 NOTE — Progress Notes (Addendum)
NAME:  Evangelos Paulino, MRN:  [id number], DOB:  Aug 08, 1961, LOS: 9 ADMISSION DATE:  12/30/2018, CONSULTATION DATE:  12/29/18 REFERRING MD:  Dr. Germaine Pomfret , CHIEF COMPLAINT:  Left side weakness   Brief History   58 yr old with history of HTN, afib, febrile and coughing x 1 d, found with left facial droop, left sided weakness 5/10. 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 Brought to ICU intubated  Past Medical History  HTN, ESRD, dialysis TThSat, Afib w/ RVR  Significant Hospital Events   5/10 Angiogram in IR >> no clot retraction due to distal location of thrombus; hypotension from meds 5/11 d/c diprivan due to triglyceride levels 5/12 start CRRT; start amiodarone 5/14 Off pressors, on CRRT 5/17 difficult day yesterday, resume CRRT on pressors-being weaned 5/18 no significant changes overnight.  Seen by infectious disease for severe leukocytosis, repeat blood culture sent.  Of note chest x-ray actually looked worse.  Not clear if this representing new pneumonia or pulmonary edema. Seen by thoracic surg. Not candidate for surgery    Consults:  Nephrology  ID Cardiology   Procedures:  ETT 5/10 >>  Rt radial aline 5/10 >>  Rt IJ HD cath 5/11 >>  Left IJ triple-lumen catheter insertion 5/16  Significant Diagnostic Tests:  CT angio head/neck 5/10 >> Rt M2 occlusion, severe Lt MCA stenosis Echo 5/10 >> EF greater than 65%, severe LCH, severe MR MRI brain 5/11 >> multifocal acute ischemia scattered in both cerebral and cerebellar hemispheres 5/17-chest x-ray revealed improvement in congestion/infiltrate Micro Data:  COVID 5/10 >> negative Blood 5/10 >> MSSA Blood culture 5/18 Antimicrobials:  Cefepime 5/10 >> 5/11 Vancomycin 5/10 >> 5/11 Ancef 5/11 >>5/13 Nafcillin 5/13 > cdiff 5/19>>> Respiratory culture 5/19>>>  Subjective:    Objective   Blood pressure 122/85, pulse 96, temperature 97.6 F (36.4 C),  temperature source Axillary, resp. rate 18, height 5\' 9"  (1.753 m), weight 96 kg, SpO2 98 %.    Vent Mode: PCV FiO2 (%):  [60 %-80 %] 60 % Set Rate:  [18 bmp] 18 bmp PEEP:  [10 cmH20] 10 cmH20 Plateau Pressure:  [20 cmH20-26 cmH20] 22 cmH20   Intake/Output Summary (Last 24 hours) at 12/29/2018 0754 Last data filed at 12/29/2018 0700 Gross per 24 hour  Intake 4335.76 ml  Output 4750 ml  Net -414.24 ml   Filed Weights   12/18/2018 1500 12/25/18 0440 12/27/18 0500  Weight: 85.3 kg 96.1 kg 96 kg    Examination:  General: This is a chronically ill 58 year old male patient he remains sedated on ventilator HEENT: Orally intubated mucous membranes moist no JVD pupils equal reactive Neuro: Intermittently agitated.  Continues to have left-sided hemiparesis, intermittently will follow commands on the right Pulmonary: Coarse scattered rhonchi.  Remains mildly asynchronous on controlled ventilatory mode, no significant difference between ventilator synchrony when comparing pressure control versus PRVC Cardiac: Irregular irregular with bursts up into the 120s to 130s remains in atrial fibrillation systolic murmur over mitral area appreciated Abdomen: Soft nontender liquid stools Flexi-Seal in place positive bowel sounds GU: Minimal urine output, condom catheter in place, bladder scan with less than 50 cc urine Extremities: Warm, dry, generalized anasarca appreciated pulses strong  Resolved Hospital Problem list     Assessment & Plan:    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, complicated by possible CNS involvement -TEE not indicated per Cards as meets dx criteria for endocarditis  -Pressor requirements:still  on pressors -keeping even volume status -deemed not candidate for surgery at this point  Plan Continuing nafcillin as directed by infectious disease he will need to complete 6 weeks of therapy, this was started on 5/13 Keep even vol status on CRRT Wean norepi  for systolic blood pressure greater than 100  Severe leukocytosis -Infectious disease following but no significant improvement, white blood cell count 48.5 on 18th now 46.1 on the 19th -He does not have fever , However this could absolutely be masked by CRRT -Repeat blood cultures were sent on the 18th Plan Send sputum culture and Cdiff (I worry about this in such significant leukocytosis) Repeating chest x-ray If clinically declining we will add cefepime to his nafcillin per infectious disease recommendation  CVA: Embolic, either from MSSA endocarditis or longstanding atrial fibrillation -Thrombectomy attempted by IR-unsuccessful Plan Continuing daily aspirin Additional recommendations per stroke service who are primary  Agitated delirium/acute metabolic encephalopathy Added PRN Lorazepam on 5/18 Plan  Continuing Precedex for RASS goal of 0 to -1 Add low dose seroquel  50mg  q 12   Acute respiratory failure due to airway compromise, in the setting of CVA further complicated by bilateral pulmonary infiltrates felt mix of edema versus pneumonia Portable chest x-ray was personally reviewed from 5/18: This demonstrates endotracheal tube in satisfactory position.  The left triple-lumen catheter satisfactory as is the right IJ HD catheter.  He has patchy bilateral airspace disease.  This actually looked worse when comparing film from the 17th -His mental status and consequences of stroke will be barrier to extubation Plan Continuing full ventilator support VAP bundle Daily assessment for readiness to wean Ongoing severe leukocytosis Need to begin discussion about possibility of tracheostomy Repeat chest x-ray today and in a.m. 5/20 Would like to get him towards trial of extubation as trach and HD add makes him poor candidate for this  End-stage renal disease Plan CRRT per nephrology Daily chemistries  Anemia of critical illness, likely somewhat complicated by CRRT.  Hemoglobin down  from 8.1-7.1.  No other evidence of bleeding Plan Trend CBC Transfuse for hemoglobin less than 7; his last transfusion was on 5/17 when he received 1 unit  Thrombocytopenia: These are stable Plan Trend CBC  Atrial Fibrillation (chronic) Plan Continuing amiodarone Holding systemic AC for now given endocarditis. This can be re-evaluated after completing 6 weeks of ABX  Best practice:  Diet: Continue tube feeds DVT prophylaxis: SCDs, on heparin GI prophylaxis: Protonix Mobility: Bedrest Code Status: Full code Disposition: he remains critically ill with multiple issues including ongoing high PEEP and FiO2 requirements and concern for new H CAP versus just persistent pulmonary edema for this we have changed him back to [facility] mode of ventilation and will continue to wean his PEEP/FiO2 additionally were checking sputum culture.  From a hemodynamic standpoint he remains on low-dose norepinephrine, we are weaning this for systolic blood pressure greater than 100, keeping even volume status on CRRT.  From a neuro standpoint he continues to have left-sided hemiparesis, he is agitated at times nursing reporting almost as if he is extremely anxious.  He is currently on Precedex and fentanyl infusion, we will add some low-dose Seroquel to see if this evens him out and allows [place] to wean fentanyl.  He continues to have significantly high leukocytosis.  This all could be from his ongoing endocarditis however also worried about superimposed ventilator associated pneumonia or even C. difficile colitis given such high leukocytosis often seen in the setting and he is also been  on prolonged antibiotics we will go ahead and check C. difficile.  He has been seen by thoracic surgery and deemed not a candidate for surgery at this point.  Overall I think his prognosis is quite poor.  We need to evaluate for new potential untreated infection as this may be clouding his current neurological exam.  Ideally would like to try to  trial extubation as tracheostomy will severely limit his options disposition wise after his acute illness is improved.  Also severely limits options for outpatient dialysis  My critical care time is 46 minutes  Erick Colace ACNP-BC [place] Pager # [phone] OR # [phone] if no answer  Attending section.  Remains on pressors, sedation.  Having diarrhea.  WBC increased.  BP 90/70    Pulse (!) 110    Temp (!) 97.4 F (36.3 C) (Axillary)    Resp 14    Ht 5\' 9"  (1.753 m)    Wt 96 kg    SpO2 95%    BMI 31.25 kg/m   RASS -2.  ETT in.  HR irregular, tachycardic, 2/6 SM.  B/l rhonchi.  Abdomen soft, mild distention, increased bowel sounds.  1+ edema.  A/p  Acute hypoxic respiratory failure. - full vent support - will likely need trach  Septic shock from MSSA endocarditis. - continue ABx - pressors to keep MAP > 65 - not candidate for surgical intervention until medically stable  Diarrhea with worsening leukocytosis. - check C diff  CVA. - per neurology  ESRD. - CRRT  CC time by me independent of APP time 33 minutes  Chesley Mires, MD [facility] Pulmonary/Critical Care 12/29/2018, 10:28 AM

## 2018-12-29 NOTE — Progress Notes (Signed)
RT note: ventilator changes made per MD.  Patient now on PRVC, tidal volume of 560 (patient's 8cc), respiratory rate of 18, FIO2 50%, and PEEP of 10.  Per MD, wean PEEP as tolerated per protocol.  Will continue to monitor.

## 2018-12-29 NOTE — Progress Notes (Addendum)
STROKE TEAM PROGRESS NOTE   INTERVAL HISTORY Pt lethargic, still on vent and CCRT. Leukocytosis slightly better but still at 40s, CCM planing for infectious work up with sputum culture and rule out C. Diff. Neuro largely unchanged. Continue to have low BP and tachycardia.  Vitals:   12/29/18 0728 12/29/18 0730 12/29/18 0745 12/29/18 0800  BP:  91/81 116/88   Pulse:      Resp:  17 (!) 24   Temp:    (!) 97.4 F (36.3 C)  TempSrc:    Axillary  SpO2: 98%     Weight:      Height:        CBC:  Recent Labs  Lab 12/28/18 0616 12/29/18 0500  WBC 48.5* 46.1*  HGB 8.1* 7.1*  HCT 24.3* 21.9*  MCV 92.4 95.6  PLT 144* 147*    Basic Metabolic Panel:  Recent Labs  Lab 12/28/18 0616 12/28/18 1507 12/29/18 0500  NA 137 139 138  K 4.3 4.7 4.4  CL 104 103 102  CO2 23 25 25   GLUCOSE 121* 126* 142*  BUN 55* 50* 43*  CREATININE 2.82* 2.61* 2.15*  CALCIUM 8.7* 8.7* 8.8*  MG 2.6*  --  2.7*  PHOS 4.0 4.9* 3.6   Ct Angio Head W Or Wo Contrast  Result Date: 12/29/2018 CLINICAL DATA:  Left-sided weakness, facial droop, and slurred speech. EXAM: CT ANGIOGRAPHY HEAD AND NECK CT PERFUSION BRAIN TECHNIQUE: Multidetector CT imaging of the head and neck was performed using the standard protocol during bolus administration of intravenous contrast. Multiplanar CT image reconstructions and MIPs were obtained to evaluate the vascular anatomy. Carotid stenosis measurements (when applicable) are obtained utilizing NASCET criteria, using the distal internal carotid diameter as the denominator. Multiphase CT imaging of the brain was performed following IV bolus contrast injection. Subsequent parametric perfusion maps were calculated using RAPID software. CONTRAST:  156mL OMNIPAQUE IOHEXOL 350 MG/ML SOLN COMPARISON:  None. FINDINGS: CTA NECK FINDINGS The study is motion degraded including moderate to severe motion artifact through the larynx which limits assessment of the carotid bifurcations. Aortic arch:  Standard 3 vessel aortic arch with mild atherosclerotic plaque. No significant arch vessel origin stenosis. Right carotid system: Patent with moderate calcified atherosclerosis about the carotid bifurcation. No evidence of dissection or significant stenosis within limitations of motion artifact. Left carotid system: Patent with mild calcified atherosclerosis about the carotid bifurcation without evidence of significant stenosis or dissection. Vertebral arteries: Patent with the left being mildly dominant. Calcified atherosclerosis at the vertebral artery origins results in moderate to severe stenosis bilaterally. Skeleton: Mild cervical disc degeneration. 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 Upper chest: Clear lung apices. Review of the MIP images confirms the above findings CTA HEAD FINDINGS Anterior circulation: The internal carotid arteries are patent from skull base to carotid termini with calcified atherosclerosis resulting in mild cavernous stenosis on the right. The right M1 segment is widely patent. Branch vessel assessment is limited by motion artifact, however there is a suspected mid right M2 branch vessel occlusion. The left MCA appears duplicated with suggestion of severe stenosis of the more superior origin although motion artifact may be contributing to this appearance. ACAs are patent without evidence of flow limiting proximal stenosis. No aneurysm is identified. Posterior circulation: The intracranial vertebral arteries are patent to the basilar without evidence of significant stenosis. P ICAs and a ICAs are not well evaluated. SCA is are patent. The basilar artery is widely patent. There is  a small right posterior communicating artery. PCAs are patent without evidence of flow limiting proximal stenosis allowing for suspected artifactual narrowing in the proximal right P2 segment. No aneurysm is identified. Venous sinuses: Patent. Anatomic variants: None. Review  of the MIP images confirms the above findings CT Brain Perfusion Findings: ASPECTS: 10 CBF (<30%) Volume: 72mL Perfusion (Tmax>6.0s) volume: 50mL Mismatch Volume: 24mL Infarction Location: No core infarct by standard RAPID criteria. Penumbra anterolaterally in the right parietal lobe. IMPRESSION: 1. Motion degraded examination with limited assessment of the small and medium-sized intracranial arteries. 2. Suspected mid right M2 branch vessel occlusion with 9 mL penumbra in the right parietal lobe and no core infarct based on CTP. 3. No more proximal large vessel occlusion. 4. Patent cervical carotid arteries without evidence of significant stenosis. 5. 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 6. Possible severe left MCA origin stenosis. 7.  Aortic Atherosclerosis (ICD10-I70.0). These results were called by telephone at the time of interpretation on 01/02/2019 at 12:29 pm to Dr. Kerney Elbe , who verbally acknowledged these results. Electronically Signed   By: Logan Bores M.D.   On: 12/30/2018 12:59   Ct Angio Neck W Or Wo Contrast  Result Date: 01/02/2019 CLINICAL DATA:  Left-sided weakness, facial droop, and slurred speech. EXAM: CT ANGIOGRAPHY HEAD AND NECK CT PERFUSION BRAIN TECHNIQUE: Multidetector CT imaging of the head and neck was performed using the standard protocol during bolus administration of intravenous contrast. Multiplanar CT image reconstructions and MIPs were obtained to evaluate the vascular anatomy. Carotid stenosis measurements (when applicable) are obtained utilizing NASCET criteria, using the distal internal carotid diameter as the denominator. Multiphase CT imaging of the brain was performed following IV bolus contrast injection. Subsequent parametric perfusion maps were calculated using RAPID software. CONTRAST:  173mL OMNIPAQUE IOHEXOL 350 MG/ML SOLN COMPARISON:  None. FINDINGS: CTA NECK FINDINGS The study is motion degraded including moderate to severe  motion artifact through the larynx which limits assessment of the carotid bifurcations. Aortic arch: Standard 3 vessel aortic arch with mild atherosclerotic plaque. No significant arch vessel origin stenosis. Right carotid system: Patent with moderate calcified atherosclerosis about the carotid bifurcation. No evidence of dissection or significant stenosis within limitations of motion artifact. Left carotid system: Patent with mild calcified atherosclerosis about the carotid bifurcation without evidence of significant stenosis or dissection. Vertebral arteries: Patent with the left being mildly dominant. Calcified atherosclerosis at the vertebral artery origins results in moderate to severe stenosis bilaterally. Skeleton: Mild cervical disc degeneration. 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 Upper chest: Clear lung apices. Review of the MIP images confirms the above findings CTA HEAD FINDINGS Anterior circulation: The internal carotid arteries are patent from skull base to carotid termini with calcified atherosclerosis resulting in mild cavernous stenosis on the right. The right M1 segment is widely patent. Branch vessel assessment is limited by motion artifact, however there is a suspected mid right M2 branch vessel occlusion. The left MCA appears duplicated with suggestion of severe stenosis of the more superior origin although motion artifact may be contributing to this appearance. ACAs are patent without evidence of flow limiting proximal stenosis. No aneurysm is identified. Posterior circulation: The intracranial vertebral arteries are patent to the basilar without evidence of significant stenosis. P ICAs and a ICAs are not well evaluated. SCA is are patent. The basilar artery is widely patent. There is a small right posterior communicating artery. PCAs are patent without evidence of flow limiting  proximal stenosis allowing for suspected artifactual narrowing in the proximal  right P2 segment. No aneurysm is identified. Venous sinuses: Patent. Anatomic variants: None. Review of the MIP images confirms the above findings CT Brain Perfusion Findings: ASPECTS: 10 CBF (<30%) Volume: 55mL Perfusion (Tmax>6.0s) volume: 49mL Mismatch Volume: 56mL Infarction Location: No core infarct by standard RAPID criteria. Penumbra anterolaterally in the right parietal lobe. IMPRESSION: 1. Motion degraded examination with limited assessment of the small and medium-sized intracranial arteries. 2. Suspected mid right M2 branch vessel occlusion with 9 mL penumbra in the right parietal lobe and no core infarct based on CTP. 3. No more proximal large vessel occlusion. 4. Patent cervical carotid arteries without evidence of significant stenosis. 5. 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 6. Possible severe left MCA origin stenosis. 7.  Aortic Atherosclerosis (ICD10-I70.0). These results were called by telephone at the time of interpretation on 12/23/2018 at 12:29 pm to Dr. Kerney Elbe , who verbally acknowledged these results. Electronically Signed   By: Logan Bores M.D.   On: 01/08/2019 12:59   Thomas Brain Wo Contrast  Result Date: 12/21/2018 CLINICAL DATA:  Stroke follow-up EXAM: MRI HEAD WITHOUT CONTRAST TECHNIQUE: Multiplanar, multiecho pulse sequences of the brain and surrounding structures were obtained without intravenous contrast. COMPARISON:  CTA head neck 12/30/2018 FINDINGS: BRAIN: Multifocal abnormal diffusion restriction within both cerebellar hemispheres and scattered throughout multiple bilateral supratentorial territories. The largest area of ischemia is in the right frontal operculum and insula. The midline structures are normal. No midline shift or other mass effect. Early confluent hyperintense T2-weighted signal of the periventricular and deep white matter, most commonly due to chronic ischemic microangiopathy. Generalized atrophy without lobar predilection.  Multiple scattered foci of microhemorrhage, many of which correspond to areas of ischemia. No mass lesion. VASCULAR: Abnormal flow void within the right transverse sinus. The arterial flow voids are preserved. SKULL AND UPPER CERVICAL SPINE: Calvarial bone marrow signal is normal. There is no skull base mass. Visualized upper cervical spine and soft tissues are normal. SINUSES/ORBITS: Fluid level in the maxillary sinuses. The orbits are normal. IMPRESSION: 1. Multifocal acute ischemia scattered throughout both cerebral and cerebellar hemispheres, with the largest area located in the right frontal operculum and insula. 2. Multifocal petechiae/microhemorrhage at the sites of ischemic infarction. No intraparenchymal hematoma or mass effect. 3. Abnormal flow void in the right transverse sinus could indicate dural venous sinus thrombosis. However, on the earlier CTA and the interventional angiogram, the sinus was clearly patent. Electronically Signed   By: Ulyses Jarred M.D.   On: 12/21/2018 02:28   Ct Cerebral Perfusion W Contrast  Result Date: 12/29/2018 CLINICAL DATA:  Left-sided weakness, facial droop, and slurred speech. EXAM: CT ANGIOGRAPHY HEAD AND NECK CT PERFUSION BRAIN TECHNIQUE: Multidetector CT imaging of the head and neck was performed using the standard protocol during bolus administration of intravenous contrast. Multiplanar CT image reconstructions and MIPs were obtained to evaluate the vascular anatomy. Carotid stenosis measurements (when applicable) are obtained utilizing NASCET criteria, using the distal internal carotid diameter as the denominator. Multiphase CT imaging of the brain was performed following IV bolus contrast injection. Subsequent parametric perfusion maps were calculated using RAPID software. CONTRAST:  114mL OMNIPAQUE IOHEXOL 350 MG/ML SOLN COMPARISON:  None. FINDINGS: CTA NECK FINDINGS The study is motion degraded including moderate to severe motion artifact through the larynx  which limits assessment of the carotid bifurcations. Aortic arch: Standard 3 vessel aortic arch with mild atherosclerotic plaque. No significant arch  vessel origin stenosis. Right carotid system: Patent with moderate calcified atherosclerosis about the carotid bifurcation. No evidence of dissection or significant stenosis within limitations of motion artifact. Left carotid system: Patent with mild calcified atherosclerosis about the carotid bifurcation without evidence of significant stenosis or dissection. Vertebral arteries: Patent with the left being mildly dominant. Calcified atherosclerosis at the vertebral artery origins results in moderate to severe stenosis bilaterally. Skeleton: Mild cervical disc degeneration. 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 Upper chest: Clear lung apices. Review of the MIP images confirms the above findings CTA HEAD FINDINGS Anterior circulation: The internal carotid arteries are patent from skull base to carotid termini with calcified atherosclerosis resulting in mild cavernous stenosis on the right. The right M1 segment is widely patent. Branch vessel assessment is limited by motion artifact, however there is a suspected mid right M2 branch vessel occlusion. The left MCA appears duplicated with suggestion of severe stenosis of the more superior origin although motion artifact may be contributing to this appearance. ACAs are patent without evidence of flow limiting proximal stenosis. No aneurysm is identified. Posterior circulation: The intracranial vertebral arteries are patent to the basilar without evidence of significant stenosis. P ICAs and a ICAs are not well evaluated. SCA is are patent. The basilar artery is widely patent. There is a small right posterior communicating artery. PCAs are patent without evidence of flow limiting proximal stenosis allowing for suspected artifactual narrowing in the proximal right P2 segment. No aneurysm is  identified. Venous sinuses: Patent. Anatomic variants: None. Review of the MIP images confirms the above findings CT Brain Perfusion Findings: ASPECTS: 10 CBF (<30%) Volume: 58mL Perfusion (Tmax>6.0s) volume: 19mL Mismatch Volume: 38mL Infarction Location: No core infarct by standard RAPID criteria. Penumbra anterolaterally in the right parietal lobe. IMPRESSION: 1. Motion degraded examination with limited assessment of the small and medium-sized intracranial arteries. 2. Suspected mid right M2 branch vessel occlusion with 9 mL penumbra in the right parietal lobe and no core infarct based on CTP. 3. No more proximal large vessel occlusion. 4. Patent cervical carotid arteries without evidence of significant stenosis. 5. 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 6. Possible severe left MCA origin stenosis. 7.  Aortic Atherosclerosis (ICD10-I70.0). These results were called by telephone at the time of interpretation on 12/26/2018 at 12:29 pm to Dr. Kerney Elbe , who verbally acknowledged these results. Electronically Signed   By: Logan Bores M.D.   On: 01/01/2019 12:59   Dg Chest Port 1 View  Result Date: 12/28/2018 CLINICAL DATA:  Respiratory failure. EXAM: PORTABLE CHEST 1 VIEW COMPARISON:  12/27/2018 FINDINGS: Endotracheal tube terminates 4 cm above the carina. Bilateral jugular catheters are unchanged. An enteric tube courses into the upper abdomen. The cardiac silhouette is borderline enlarged. There is aortic atherosclerosis. Interstitial and patchy airspace opacities throughout both lungs have mildly to moderately increased in the interim. No sizable pleural effusion or pneumothorax is identified with note made incomplete imaging of the left costophrenic angle. IMPRESSION: Worsening bilateral airspace disease. Electronically Signed   By: Logan Bores M.D.   On: 12/28/2018 09:13   Dg Chest Port 1 View  Result Date: 12/27/2018 CLINICAL DATA:  Intubated EXAM: PORTABLE CHEST  1 VIEW COMPARISON:  One-view chest x-ray 12/26/2018 FINDINGS: Heart size is normal. Endotracheal tube terminates 5.5 cm above the carina, in satisfactory position. Left IJ line and right IJ sheath are stable. NG tube courses off the inferior border of the film. There  is slight improved aeration. Patchy interstitial and airspace disease is again noted bilaterally. IMPRESSION: 1. Support apparatus stable. 2. Slight improved aeration of both lungs. 3. Persistent diffuse interstitial and airspace disease. Electronically Signed   By: San Morelle M.D.   On: 12/27/2018 08:30   Dg Chest Port 1 View  Result Date: 12/26/2018 CLINICAL DATA:  Encounter for central line placement. EXAM: PORTABLE CHEST 1 VIEW COMPARISON:  12/26/2018 FINDINGS: There is a right IJ catheter with tip in the projection of the SVC. Left IJ catheter has been recently placed and tip is projecting over the cavoatrial junction. ET tube tip is above the carina. There is an enteric tube with tip just below the GE junction. Normal heart size. Diffuse bilateral pulmonary opacities are again noted and appear unchanged from previous exam IMPRESSION: 1. Interval placement of a left IJ catheter with tip projecting over the cavoatrial junction. No pneumothorax identified. 2. No change in aeration to lungs compared with previous exam. Electronically Signed   By: Kerby Moors M.D.   On: 12/26/2018 18:05   Dg Chest Port 1 View  Result Date: 12/26/2018 CLINICAL DATA:  Respiratory failure EXAM: PORTABLE CHEST 1 VIEW COMPARISON:  12/25/2018 FINDINGS: Endotracheal tube terminates 2.5 cm above the carina. Right IJ venous catheter terminates in the mid SVC. Enteric tube terminates in the gastric cardia. Multifocal patchy opacities in the lungs bilaterally, left upper lobe predominant, progressive from the prior and suspicious for multifocal pneumonia. No definite pleural effusions. No pneumothorax. The heart is top-normal in size. IMPRESSION: Endotracheal  tube terminates 2.5 cm above the carina. Multifocal pneumonia, mildly progressive. Electronically Signed   By: Julian Hy M.D.   On: 12/26/2018 06:54   Dg Chest Port 1 View  Result Date: 12/25/2018 CLINICAL DATA:  58 year old male with cerebral infarcts, sepsis, bacteremia. EXAM: PORTABLE CHEST 1 VIEW COMPARISON:  12/24/2018 and earlier. FINDINGS: Portable AP semi upright view at 0644 hours. Stable endotracheal tube and right IJ central line. Enteric tube tip at the level of the gastric body, the side hole is not clearly identified. Improved ventilation since yesterday with decreasing indistinct perihilar opacity. Mildly larger lung volumes. Stable cardiac size and mediastinal contours. No pneumothorax, pleural effusion or areas of worsening ventilation. Stable visible bowel gas pattern. IMPRESSION: 1. Stable lines and tubes. 2. Improved ventilation since yesterday with decreasing indistinct perihilar opacity. 3. No new cardiopulmonary abnormality. Electronically Signed   By: Genevie Ann M.D.   On: 12/25/2018 09:21   Dg Chest Port 1 View  Result Date: 12/24/2018 CLINICAL DATA:  Acute respiratory failure. EXAM: PORTABLE CHEST 1 VIEW COMPARISON:  Yesterday. FINDINGS: Endotracheal tube, enteric tube, and right central line remain in place. Persistent low lung volumes. Ill-defined bilateral lung opacities, left greater than right, with slight progression from prior. Worsening retrocardiac and right basilar opacity with more confluent density. No large pleural effusion. No pneumothorax. IMPRESSION: Worsening bilateral perihilar lung opacities, left greater than right. This may be pulmonary edema, ARDS, or multifocal pneumonia. More confluent opacities at the bases is also progressed. Electronically Signed   By: Keith Rake M.D.   On: 12/24/2018 03:58   Dg Chest Port 1 View  Result Date: 12/23/2018 CLINICAL DATA:  58 year old male with cerebral infarcts, sepsis, bacteremia, respiratory failure. EXAM:  PORTABLE CHEST 1 VIEW COMPARISON:  12/22/2018 and earlier. FINDINGS: Portable AP semi upright view at 0649 hours. Stable endotracheal tube tip between the level the clavicles and carina. Enteric tube courses to the abdomen, tip not included. Stable  right IJ central line. Mildly larger lung volumes and regressed retrocardiac opacity with air bronchograms. The left hemidiaphragm is visible today. Mildly asymmetric increased left lung interstitial opacity is stable. No pneumothorax or new pulmonary opacity. Normal cardiac size and mediastinal contours. Negative visible bowel gas pattern. IMPRESSION: 1. Stable lines and tubes. 2. Mildly larger lung volumes and regressed left lower lobe collapse or consolidation. 3. Asymmetric interstitial opacity in the left lung persists, favor infection over asymmetric edema. 4. No new cardiopulmonary abnormality. Electronically Signed   By: Genevie Ann M.D.   On: 12/23/2018 08:38   Dg Chest Port 1 View  Result Date: 12/22/2018 CLINICAL DATA:  58 year old male with cerebral infarcts, sepsis, staph bacteremia, respiratory failure. EXAM: PORTABLE CHEST 1 VIEW COMPARISON:  12/21/2018 and earlier. FINDINGS: Portable AP semi upright view at 0528 hours. ET tube in good position between the clavicles and carina. Enteric tube courses to the abdomen, side hole is at the level of the GE junction. Stable right IJ central line. Left lower lobe collapse or consolidation with some air bronchograms. Additional patchy left perihilar and medial right lung base opacity. No pneumothorax or pulmonary edema. No definite pleural effusion. No acute osseous abnormality identified. IMPRESSION: 1. Enteric tube side hole at the level of the GE junction, advance 5 cm to ensure side hole placement within the stomach. 2.  Otherwise stable lines and tubes. 3. Stable ventilation with left lower lobe collapse or consolidation and patchy additional perihilar opacity. Electronically Signed   By: Genevie Ann M.D.   On:  12/22/2018 07:23   Dg Chest Port 1 View  Result Date: 12/21/2018 CLINICAL DATA:  Central line placement. EXAM: PORTABLE CHEST 1 VIEW COMPARISON:  12/29/2018 FINDINGS: Endotracheal tube with tip 3.1 cm above the carina. Interval advancement of the nasogastric tube which has tip just below the expected region of the gastroesophageal junction as this could be advanced another 5 cm. Right IJ central venous catheter has tip over the SVC. Lungs are adequately inflated demonstrate hazy opacification over the left lung which may be due to layering effusion with atelectasis. Subtle hazy opacification of the perihilar regions which may be due to mild interstitial edema. Cardiomediastinal silhouette and remainder of the exam is unchanged. IMPRESSION: Hazy opacification over the left lung which may be due to layering effusion with atelectasis. Mild hazy opacification of the perihilar regions which suggest mild interstitial edema. Tubes and lines as described. Electronically Signed   By: Marin Olp M.D.   On: 12/21/2018 11:39   Dg Chest Portable 1 View  Result Date: 12/25/2018 CLINICAL DATA:  Acute LEFT CVA. EXAM: PORTABLE CHEST 1 VIEW COMPARISON:  03/04/2016 radiographs FINDINGS: An endotracheal tube with tip 4 cm above the carina and OG tube with tip overlying the distal esophagus noted. UPPER limits normal heart size noted. There is no evidence of focal airspace disease, pulmonary edema, suspicious pulmonary nodule/mass, pleural effusion, or pneumothorax. No acute bony abnormalities are identified. IMPRESSION: Endotracheal tube with tip 4 cm above the carina. OG tube with tip overlying the distal esophagus-recommend advancement. No acute cardiopulmonary disease. Electronically Signed   By: Margarette Canada M.D.   On: 12/26/2018 14:45   Dg Abd Portable 1 View  Result Date: 01/08/2019 CLINICAL DATA:  OG tube placement. EXAM: PORTABLE ABDOMEN - 1 VIEW COMPARISON:  None. FINDINGS: An OG tube is identified with tip  overlying the distal esophagus. Recommend advancement. The bowel gas pattern is unremarkable. IMPRESSION: OG tube with tip overlying the distal esophagus-recommend advancement.  Electronically Signed   By: Margarette Canada M.D.   On: 12/18/2018 14:46   Ct Head Code Stroke Wo Contrast  Result Date: 01/10/2019 CLINICAL DATA:  Code stroke. Left-sided weakness, facial droop, and slurred speech. EXAM: CT HEAD WITHOUT CONTRAST TECHNIQUE: Contiguous axial images were obtained from the base of the skull through the vertex without intravenous contrast. COMPARISON:  Head CT 09/17/2013 FINDINGS: Brain: There is no evidence of acute infarct, intracranial hemorrhage, mass, midline shift, or extra-axial fluid collection. Patchy cerebral white matter hypodensities are similar to the prior CT and nonspecific but compatible with moderately age advanced chronic small vessel ischemic disease. Mild prominence of the lateral ventricles is also unchanged and may reflect central predominant cerebral atrophy. Vascular: Calcified atherosclerosis at the skull base. No hyperdense vessel. Skull: No fracture or focal osseous lesion. Sinuses/Orbits: Mild mucosal thickening in the maxillary sinuses. Clear mastoid air cells. Left cataract extraction. Other: None. ASPECTS [facility] Stroke Program Early CT Score) - Ganglionic level infarction (caudate, lentiform nuclei, internal capsule, insula, M1-M3 cortex): 7 - Supraganglionic infarction (M4-M6 cortex): 3 Total score (0-10 with 10 being normal): 10 IMPRESSION: 1. No evidence of acute intracranial abnormality. 2. ASPECTS is 10. 3. Moderate chronic small vessel ischemic disease. These results were communicated to Dr. Cheral Marker at 12:09 pm on 01/10/2019 by text page via the [facility] messaging system. Electronically Signed   By: Logan Bores M.D.   On: 12/12/2018 12:37   Ir Angio Intra Extracran Sel Com Carotid Innominate Bilat Mod Sed  Result Date: 12/24/2018 CLINICAL DATA:  Left-sided facial droop,  severe dysarthria, and left upper weakness. CT angiogram revealing occlusion of the right middle cerebral artery questionable M2 division. EXAM: IR ANGIO VERTEBRAL SEL SUBCLAVIAN INNOMINATE UNI RIGHT MOD SED; IR ANGIO VERTEBRAL SEL VERTEBRAL UNI LEFT MOD SED; BILATERAL COMMON CAROTID AND INNOMINATE ANGIOGRAPHY COMPARISON:  CT angiogram of the head and neck of 12/11/2018. MEDICATIONS: Heparin none units IV; Ancef 2 g IV antibiotic was administered within 1 hour of the procedure. ANESTHESIA/SEDATION: General anesthesia. CONTRAST:  Isovue 300 approximately 50 mL. FLUOROSCOPY TIME:  Fluoroscopy Time: 5 minutes 0 seconds (712 mGy). COMPLICATIONS: None immediate. TECHNIQUE: Informed written consent was obtained from the patient's mother after a thorough discussion of the procedural risks, benefits and alternatives. All questions were addressed. Maximal Sterile Barrier Technique was utilized including caps, mask, sterile gowns, sterile gloves, sterile drape, hand hygiene and skin antiseptic. A timeout was performed prior to the initiation of the procedure. The right groin was prepped and draped in the usual sterile fashion. Thereafter using modified Seldinger technique, transfemoral access into the right common femoral artery was obtained without difficulty. Over a 0.035 inch guidewire, a 5 French Pinnacle sheath was inserted. Through this, and also over 0.035 inch guidewire, a 5 [place] JB 1 catheter was advanced to the aortic arch region and selectively positioned in the right common carotid artery, , the right subclavian artery, the left common carotid artery and the left vertebral artery. FINDINGS: The right subclavian arteriogram demonstrates hypoplastic right vertebral artery with a normal origin. The vessel is seen to opacify to the cranial skull base where it opacifies the right vertebrobasilar junction and the right posterior-inferior cerebellar artery. The opacified portion of the basilar artery is widely patent.  The right common carotid arteriogram demonstrates the right external carotid artery and its major branches to be widely patent. The right internal carotid artery at the bulb to the cranial skull base demonstrates wide patency. The petrous, cavernous and the supraclinoid segments  are widely patent. The right middle cerebral artery and the right anterior cerebral artery opacify into the capillary and venous phases. There is a tapered severe stenosis followed by occlusion of a mid perisylvian branch of the superior division of the right middle cerebral artery in the M3 region. Extensive collaterals are seen retrogradely opacifying this distribution. Dural venous sinuses are widely patent. The left common carotid arteriogram demonstrates the left external carotid artery and its major branches to be widely patent. The left internal carotid artery at the bulb to the cranial skull base demonstrates wide patency. The petrous, cavernous and the supraclinoid segments are widely patent. The left middle cerebral artery demonstrates an early bifurcation a developmental variation, the left anterior cerebral artery opacifies with the left middle cerebral artery into the capillary and the venous phases. The left vertebral artery origin is widely patent. The vessel opacifies to the cranial skull base. Wide patency is seen of the left vertebrobasilar junction and the left posterior-inferior cerebellar artery which is hypoplastic. Suggestion of a left anterior-inferior cerebellar artery/posterior-inferior cerebellar artery complex is seen. The opacified portion of the basilar artery, the posterior cerebral arteries, the superior cerebellar arteries and the anterior-inferior cerebellar arteries is noted into the capillary and venous phases. Unopacified blood is seen in the basilar artery from the contralateral vertebral artery. IMPRESSION: Angiographically tapered occlusion of an anterior perisylvian branch of the superior division of  the right middle cerebral artery and the M3 region. PLAN: Follow-up with the referring neurologist. Electronically Signed   By: Luanne Bras M.D.   On: 12/21/2018 13:24   Ir Angio Vertebral Sel Subclavian Innominate Uni R Mod Sed  Result Date: 12/24/2018 CLINICAL DATA:  Left-sided facial droop, severe dysarthria, and left upper weakness. CT angiogram revealing occlusion of the right middle cerebral artery questionable M2 division. EXAM: IR ANGIO VERTEBRAL SEL SUBCLAVIAN INNOMINATE UNI RIGHT MOD SED; IR ANGIO VERTEBRAL SEL VERTEBRAL UNI LEFT MOD SED; BILATERAL COMMON CAROTID AND INNOMINATE ANGIOGRAPHY COMPARISON:  CT angiogram of the head and neck of 12/30/2018. MEDICATIONS: Heparin none units IV; Ancef 2 g IV antibiotic was administered within 1 hour of the procedure. ANESTHESIA/SEDATION: General anesthesia. CONTRAST:  Isovue 300 approximately 50 mL. FLUOROSCOPY TIME:  Fluoroscopy Time: 5 minutes 0 seconds (712 mGy). COMPLICATIONS: None immediate. TECHNIQUE: Informed written consent was obtained from the patient's mother after a thorough discussion of the procedural risks, benefits and alternatives. All questions were addressed. Maximal Sterile Barrier Technique was utilized including caps, mask, sterile gowns, sterile gloves, sterile drape, hand hygiene and skin antiseptic. A timeout was performed prior to the initiation of the procedure. The right groin was prepped and draped in the usual sterile fashion. Thereafter using modified Seldinger technique, transfemoral access into the right common femoral artery was obtained without difficulty. Over a 0.035 inch guidewire, a 5 French Pinnacle sheath was inserted. Through this, and also over 0.035 inch guidewire, a 5 [place] JB 1 catheter was advanced to the aortic arch region and selectively positioned in the right common carotid artery, , the right subclavian artery, the left common carotid artery and the left vertebral artery. FINDINGS: The right subclavian  arteriogram demonstrates hypoplastic right vertebral artery with a normal origin. The vessel is seen to opacify to the cranial skull base where it opacifies the right vertebrobasilar junction and the right posterior-inferior cerebellar artery. The opacified portion of the basilar artery is widely patent. The right common carotid arteriogram demonstrates the right external carotid artery and its major branches to be widely patent.  The right internal carotid artery at the bulb to the cranial skull base demonstrates wide patency. The petrous, cavernous and the supraclinoid segments are widely patent. The right middle cerebral artery and the right anterior cerebral artery opacify into the capillary and venous phases. There is a tapered severe stenosis followed by occlusion of a mid perisylvian branch of the superior division of the right middle cerebral artery in the M3 region. Extensive collaterals are seen retrogradely opacifying this distribution. Dural venous sinuses are widely patent. The left common carotid arteriogram demonstrates the left external carotid artery and its major branches to be widely patent. The left internal carotid artery at the bulb to the cranial skull base demonstrates wide patency. The petrous, cavernous and the supraclinoid segments are widely patent. The left middle cerebral artery demonstrates an early bifurcation a developmental variation, the left anterior cerebral artery opacifies with the left middle cerebral artery into the capillary and the venous phases. The left vertebral artery origin is widely patent. The vessel opacifies to the cranial skull base. Wide patency is seen of the left vertebrobasilar junction and the left posterior-inferior cerebellar artery which is hypoplastic. Suggestion of a left anterior-inferior cerebellar artery/posterior-inferior cerebellar artery complex is seen. The opacified portion of the basilar artery, the posterior cerebral arteries, the superior  cerebellar arteries and the anterior-inferior cerebellar arteries is noted into the capillary and venous phases. Unopacified blood is seen in the basilar artery from the contralateral vertebral artery. 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 PLAN: Follow-up with the referring neurologist. Electronically Signed   By: Luanne Bras M.D.   On: 12/21/2018 13:24   Ir Angio Vertebral Sel Vertebral Uni L Mod Sed  Result Date: 12/24/2018 CLINICAL DATA:  Left-sided facial droop, severe dysarthria, and left upper weakness. CT angiogram revealing occlusion of the right middle cerebral artery questionable M2 division. EXAM: IR ANGIO VERTEBRAL SEL SUBCLAVIAN INNOMINATE UNI RIGHT MOD SED; IR ANGIO VERTEBRAL SEL VERTEBRAL UNI LEFT MOD SED; BILATERAL COMMON CAROTID AND INNOMINATE ANGIOGRAPHY COMPARISON:  CT angiogram of the head and neck of 01/05/2019. MEDICATIONS: Heparin none units IV; Ancef 2 g IV antibiotic was administered within 1 hour of the procedure. ANESTHESIA/SEDATION: General anesthesia. CONTRAST:  Isovue 300 approximately 50 mL. FLUOROSCOPY TIME:  Fluoroscopy Time: 5 minutes 0 seconds (712 mGy). COMPLICATIONS: None immediate. TECHNIQUE: Informed written consent was obtained from the patient's mother after a thorough discussion of the procedural risks, benefits and alternatives. All questions were addressed. Maximal Sterile Barrier Technique was utilized including caps, mask, sterile gowns, sterile gloves, sterile drape, hand hygiene and skin antiseptic. A timeout was performed prior to the initiation of the procedure. The right groin was prepped and draped in the usual sterile fashion. Thereafter using modified Seldinger technique, transfemoral access into the right common femoral artery was obtained without difficulty. Over a 0.035 inch guidewire, a 5 French Pinnacle sheath was inserted.  Through this, and also over 0.035 inch guidewire, a 5 [place] JB 1 catheter was advanced to the aortic arch region and selectively positioned in the right common carotid artery, , the right subclavian artery, the left common carotid artery and the left vertebral artery. FINDINGS: The right subclavian arteriogram demonstrates hypoplastic right vertebral artery with a normal origin. The vessel is seen to opacify to the cranial skull base where it opacifies the right vertebrobasilar junction and the right posterior-inferior cerebellar artery. The opacified portion of the basilar artery  is widely patent. The right common carotid arteriogram demonstrates the right external carotid artery and its major branches to be widely patent. The right internal carotid artery at the bulb to the cranial skull base demonstrates wide patency. The petrous, cavernous and the supraclinoid segments are widely patent. The right middle cerebral artery and the right anterior cerebral artery opacify into the capillary and venous phases. There is a tapered severe stenosis followed by occlusion of a mid perisylvian branch of the superior division of the right middle cerebral artery in the M3 region. Extensive collaterals are seen retrogradely opacifying this distribution. Dural venous sinuses are widely patent. The left common carotid arteriogram demonstrates the left external carotid artery and its major branches to be widely patent. The left internal carotid artery at the bulb to the cranial skull base demonstrates wide patency. The petrous, cavernous and the supraclinoid segments are widely patent. The left middle cerebral artery demonstrates an early bifurcation a developmental variation, the left anterior cerebral artery opacifies with the left middle cerebral artery into the capillary and the venous phases. The left vertebral artery origin is widely patent. The vessel opacifies to the cranial skull base. Wide patency is seen of the left  vertebrobasilar junction and the left posterior-inferior cerebellar artery which is hypoplastic. Suggestion of a left anterior-inferior cerebellar artery/posterior-inferior cerebellar artery complex is seen. The opacified portion of the basilar artery, the posterior cerebral arteries, the superior cerebellar arteries and the anterior-inferior cerebellar arteries is noted into the capillary and venous phases. Unopacified blood is seen in the basilar artery from the contralateral vertebral artery. 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 PLAN: Follow-up with the referring neurologist. Electronically Signed   By: Luanne Bras M.D.   On: 12/21/2018 13:24     PHYSICAL EXAM:     Temp:  [97.4 F (36.3 C)-99.1 F (37.3 C)] 97.4 F (36.3 C) (05/19 0800) Pulse Rate:  [85-131] 96 (05/19 0727) Resp:  [13-26] 24 (05/19 0745) BP: (72-143)/(42-107) 116/88 (05/19 0745) SpO2:  [89 %-100 %] 98 % (05/19 0728) FiO2 (%):  [60 %-80 %] 60 % (05/19 0728)  General - Well nourished, well developed, intubated on sedation.  Ophthalmologic - fundi not visualized due to noncooperation.  Cardiovascular - Regular rhythm, persistent tachycardia.  Neuro - intubated on sedation, eyes close but open with pain stimulation, able to simple commands with eye opening close, and right hand showing fingers, making fist, and wiggle toes but letahrgic. Eyes in mid position, limited movement to the bilateral gaze, no tracking, inconsistent blinking to visual threat, pupil 63mm bilaterally, sluggish to light. Corneal reflex positive bilaterally, gag and cough absent. Breathing over the vent.  Facial symmetry not able to test due to ET tube.  Tongue midline in mouth.  Right arm 3-/5 today, able to follow commands with right hand but weak. Able to wiggle toes on the right but no proximal movement. On pain stimulation,  no movement in LUE and LLE. DTR  diminished and no babinski. Sensation, coordination and gait not tested.   ASSESSMENT/PLAN Thomas. Lucifer Adams is a 58 y.o. male with history of atrial fibrillation not on AC, HTN and ESRD on HD presenting with left hemiparesis and left facial droop with dysarthria. Sent to IR but no correctable lesion.  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 Small vessel disease. ASPECTS 10.     CTA head & neck suspected mid R M2 branch occlusion w/ 39mL penumbra R parietal. No LVO. Possible severe L MCA origin stenosis. Aortic atherosclerosis   CT perfusion no core  Cerebral angio RT MCA   Sup division M3 branch tapered occlusion. No intervention  MRI  Multifocal B supratentorial infarcts.   2D Echo EF 65%. There are severe posterior mitral annular calcifications extending into the mitral lefalets with a mobile portion that most probably represents a large calcified mass partially detached from the leaflet. This can possibly be a source of embolism and stroke.  HIV negative   TEE not indicated per cardiology as meeting dx criteria for endocarditis  LDL UTC d/t high TG 691  HgbA1c 5.5  SCDs for VTE prophylaxis. Hold Heparin given severe anemia   No antithrombotic prior to admission, now on No antithrombotic. Not an annticoagulation candidate due to endocarditis, risk of bleeding and severe anemia at this time. Consider AC if appropriate after completing 6 wk abx course. Hold off aspirin 81 now given severe anemia requiring PRBC.   Therapy recommendations:  pending   Disposition:  Discussed with mom over the phone and will involve palliative care and mom agrees.   Acute Hypoxic Respiratory Failure, compromised airway  Intubated initially for IR  CXR worsening bilateral airspace disease  Remains on sedation, agitated now - on fentanyl,  precedex, added ativan  Not a candidate for extubation  PCCM on board  Sepsis w/ MSSA bacteremia in setting of new severe Thomas with mobile mass, likely endocarditis  WBC 18.7->22.9-> 34.8->48.5->46.1  Temp in ED 103, TMax past 24h 100.4->99.3->97.9->99.1  On nafcillin, plan treatment x 6 weeks  ID recommended addition of cefepiime if respiratory status continues to worsen  2D Echo severe posterior mitral annular calcifications extending into the mitral lefalets with a mobile portion that most probably represents a large calcified mass partially detached from the leaflet.   Repeat Blood cultures 5/13 - negative  Repeat Blood cultures 5/18 - pending   UCx no growth   Sputum culture pending  C.diff pending  Lactic acid 3.3->1.5  ID on board  Cardiology signed off  CVTS Roxy Manns) consulted- not a surgical candidate at this time. If meaningful neurologic recovery, could consider in the future.  Atrial Fibrillation w/ RVR . Home anticoagulation:  none  . On Amiodarone   . HR 80-120s . Cardiology signed off . Not on metoprolol given hypotension . Not an AC candidate d/t endocarditis and risk for bleeding - consider in future after abx treatment is completed in 6 weeks.     Hypotension in setting of sepsis  Hypertensive Emergency on admission   BP as high as 174/139 on admission  Home meds:  Norvasc 10, hydralazine 25 tid, labetalol 100 bid, lisinopril 40  BP 90-120s  On levophed  . BP goal normotensive  Severe anemia   Hb 9.0-8.8-7.7-6.9-> 1u PRBC->8.2->8.9->6.8-> PRBC -> 8.0->8.1->7.1  Likely due to ESRD vs. Acute blood loss, CCM on board more concerning towards ESRD with CRRT  ASA on hold for now  Close monitoring  Transfuse if Hb < 7.0  Hyperlipidemia  Home meds:  No statin  LDL UTC d/t high TG 691, goal < 70  Recheck lipids in am  Consider statin if LDL remains elevated at that time  AST/ALT 31/9  Dysphagia  On vent  TF per  CCM  NPO  Other Stroke Risk Factors  Overweight, Body mass index is  31.25 kg/m., recommend weight loss, diet and exercise as appropriate   Other Active Problems  ESRD on HD TTS. Nephrology on board. Continued on CRRT  Secondary hyperparathyroidism   Thrombocytopenia in setting of sepsis [id number]  INR 1.7->1.5->1.5  Hospital day # 9   This patient is critically ill and at significant risk of neurological worsening, death and care requires constant monitoring of vital signs, hemodynamics, respiratory and cardiac monitoring, extensive review of multiple databases, frequent neurological assessment, discussion with CCM team and medical decision making of high complexity. I spent 40 minutes of neurocritical care time  in the care of  this patient. I had long discussion with mom over the phone, updated pt current condition, treatment plan and poor prognosis. She expressed understanding and appreciation, and requested palliative care consult.    Rosalin Hawking, MD PhD Stroke Neurology 12/29/2018 8:45 AM   To contact Stroke Continuity provider, please refer to [email or web address] After hours, contact General Neurology

## 2018-12-29 NOTE — Progress Notes (Signed)
[facility]  Date of Admission:  12/17/2018      Total days of antibiotics 10  Day 7 Nafcillin          ASSESSMENT: Thomas Adams is a 58 y.o. man admitted for evaluation of stroke like symptoms. 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 Dr. Roxy Manns has consulted and not a surgical candidate given current condition and poor prognosis. Neurology is getting palliative care involved to help as well which is appreciated.   His FiO2 is improved today with chest xray pending. Still with impressive leukocytosis to 46K; repeated blood cultures are pending and sputum has been sent. He had 525cc watery brown diarrhea in collection system from over the last 24h. Agree with checking CDiff in this patient given his leukocytosis - continue contact precautions until results back and initiate treatment with PO vancomycin if returns positive.    PLAN:  1. Continue nafcillin  2. Follow micro data  3. Would add cefepime to nafcillin if his pulmonary status continues to worsen.  4. Continue contact precautions until results are back. CDiff treatment if indicated.     Principal Problem:   MSSA bacteremia Active Problems:   Septic shock ([place])   Bacterial endocarditis, presumed   Acute embolic stroke, bilateral hemispheres   Acute respiratory failure with hypoxia (HCC)   Paroxysmal atrial fibrillation (HCC)   CKD (chronic kidney disease) stage V requiring chronic dialysis ([place])   End stage renal disease ([place])   Encounter for central line placement   Middle cerebral artery embolism, right   Fever   . amiodarone  200 mg Per Tube BID  . chlorhexidine gluconate (MEDLINE KIT)  15 mL Mouth Rinse BID  . Chlorhexidine Gluconate Cloth  6 each Topical Daily  . darbepoetin (ARANESP) injection - DIALYSIS  150 mcg Intravenous Q Thu-HD  . feeding supplement (PRO-STAT SUGAR FREE 64)  60 mL Per Tube QID  . mouth rinse  15 mL Mouth  Rinse 10 times per day  . pantoprazole sodium  40 mg Per Tube Q24H  . QUEtiapine  50 mg Per Tube BID  . sodium chloride flush  10-40 mL Intracatheter Q12H    SUBJECTIVE: Intubated on sedation.   Interim history noted for ongoing CRRT. Unable to pull fluid now d/t pressor needs.  Improvement in ventilator settings (now on 50% FiO2 and 10 PEEP).  Sputum sample has been sent for culture Stool sample sent for CDiff with leukocytosis and diarrhea.  CXR pending.  Palliative care to join in to help with decision making as d/w with Neurology team and Thomas Adams mother.   Review of Systems: Review of Systems  Unable to perform ROS: Intubated    No Active Allergies  OBJECTIVE: Vitals:   12/29/18 1045 12/29/18 1100 12/29/18 1109 12/29/18 1110  BP: 1'07/69 93/60 93/60 '   Pulse: (!) 123 (!) 126 (!) 103   Resp: 19 15 (!) 24   Temp:      TempSrc:      SpO2: 96% (!) 89% 94% 93%  Weight:      Height:       Body mass index is 31.25 kg/m.  Physical Exam Constitutional:      Comments: Intubated. Comfortably sedated in bed presently.   HENT:     Mouth/Throat:     Mouth: Mucous membranes are moist.     Pharynx: Oropharynx is clear.  Eyes:  General: No scleral icterus.    Pupils: Pupils are equal, round, and reactive to light.  Neck:     Comments: R IJ HD catheter clean and dry  L IJ CVC catheter clean and dry  Cardiovascular:     Rate and Rhythm: Tachycardia present. Rhythm irregular.     Pulses: Normal pulses.     Heart sounds: Murmur (Systolic murmur 2/6 at apex ) present.  Pulmonary:     Comments: Breathing comfortably on ventilator with sedation.  Diffusely coarse, diminished L>R FiO2 50% PEEP 10 Abdominal:     General: Bowel sounds are normal.     Palpations: Abdomen is soft.     Tenderness: There is no abdominal tenderness.     Comments: Watery brown diarrhea in flexiseal  Skin:    General: Skin is warm and dry.     Capillary Refill: Capillary refill takes less than  2 seconds.  Neurological:     Mental Status: He is oriented to person, place, and time.    Lab Results Lab Results  Component Value Date   WBC 46.1 (H) 12/29/2018   HGB 7.1 (L) 12/29/2018   HCT 21.9 (L) 12/29/2018   MCV 95.6 12/29/2018   PLT 147 (L) 12/29/2018    Lab Results  Component Value Date   CREATININE 2.15 (H) 12/29/2018   BUN 43 (H) 12/29/2018   NA 138 12/29/2018   K 4.4 12/29/2018   CL 102 12/29/2018   CO2 25 12/29/2018    Lab Results  Component Value Date   ALT 9 12/29/2018   AST 31 12/29/2018   ALKPHOS 114 12/29/2018   BILITOT 2.3 (H) 12/29/2018     Microbiology: Recent Results (from the past 240 hour(s))  SARS Coronavirus 2 (CEPHEID- Performed in [place] hospital lab), Hosp Order     Status: None   Collection Time: 12/12/2018 11:45 AM  Result Value Ref Range Status   SARS Coronavirus 2 NEGATIVE NEGATIVE Final    Comment: (NOTE) If result is NEGATIVE SARS-CoV-2 target nucleic acids are NOT DETECTED. The SARS-CoV-2 RNA is generally detectable in upper and lower  respiratory specimens during the acute phase of infection. The lowest  concentration of SARS-CoV-2 viral copies this assay can detect is 250  copies / mL. A negative result does not preclude SARS-CoV-2 infection  and should not be used as the sole basis for treatment or other  patient management decisions.  A negative result may occur with  improper specimen collection / handling, submission of specimen other  than nasopharyngeal swab, presence of viral mutation(s) within the  areas targeted by this assay, and inadequate number of viral copies  (<250 copies / mL). A negative result must be combined with clinical  observations, patient history, and epidemiological information. If result is POSITIVE SARS-CoV-2 target nucleic acids are DETECTED. The SARS-CoV-2 RNA is generally detectable in upper and lower  respiratory specimens dur ing the acute phase of infection.  Positive  results are  indicative of active infection with SARS-CoV-2.  Clinical  correlation with patient history and other diagnostic information is  necessary to determine patient infection status.  Positive results do  not rule out bacterial infection or co-infection with other viruses. If result is PRESUMPTIVE POSTIVE SARS-CoV-2 nucleic acids MAY BE PRESENT.   A presumptive positive result was obtained on the submitted specimen  and confirmed on repeat testing.  While 2019 novel coronavirus  (SARS-CoV-2) nucleic acids may be present in the submitted sample  additional confirmatory testing may  be necessary for epidemiological  and / or clinical management purposes  to differentiate between  SARS-CoV-2 and other Sarbecovirus currently known to infect humans.  If clinically indicated additional testing with an alternate test  methodology [phone]) is advised. The SARS-CoV-2 RNA is generally  detectable in upper and lower respiratory sp ecimens during the acute  phase of infection. The expected result is Negative. Fact Sheet for Patients:  StrictlyIdeas.no Fact Sheet for Healthcare Providers: BankingDealers.co.za This test is not yet approved or cleared by the [place] FDA and has been authorized for detection and/or diagnosis of SARS-CoV-2 by FDA under an Emergency Use Authorization (EUA).  This EUA will remain in effect (meaning this test can be used) for the duration of the COVID-19 declaration under Section 564(b)(1) of the Act, 21 U.S.C. section 360bbb-3(b)(1), unless the authorization is terminated or revoked sooner. Performed at [facility], [place] [address] [place], [place] [address]   Blood Culture (routine x 2)     Status: Abnormal   Collection Time: 12/24/2018  1:57 PM  Result Value Ref Range Status   Specimen Description BLOOD RIGHT FOREARM  Final   Special Requests   Final    BOTTLES DRAWN AEROBIC AND ANAEROBIC Blood Culture adequate  volume   Culture  Setup Time   Final    GRAM POSITIVE COCCI IN BOTH AEROBIC AND ANAEROBIC BOTTLES CRITICAL RESULT CALLED TO, READ BACK BY AND VERIFIED WITH: J. LEDFORD,PHARMD [id number] 12/21/2018 T. TYSOR    Culture (A)  Final    STAPHYLOCOCCUS AUREUS SUSCEPTIBILITIES PERFORMED ON PREVIOUS CULTURE WITHIN THE LAST 5 DAYS. Performed at [facility], [place] [address] [place], [place] [address]    Report Status 12/23/2018 FINAL  Final  Blood Culture (routine x 2)     Status: Abnormal   Collection Time: 01/06/2019  1:58 PM  Result Value Ref Range Status   Specimen Description BLOOD RIGHT HAND  Final   Special Requests   Final    BOTTLES DRAWN AEROBIC AND ANAEROBIC Blood Culture adequate volume   Culture  Setup Time   Final    GRAM POSITIVE COCCI IN BOTH AEROBIC AND ANAEROBIC BOTTLES CRITICAL RESULT CALLED TO, READ BACK BY AND VERIFIED WITH: J. LEDFORD,PHARMD 0209 12/21/2018 Mena Goes Performed at [facility], [place] [address], [place], [place] [address]    Culture STAPHYLOCOCCUS AUREUS (A)  Final   Report Status 12/23/2018 FINAL  Final   Organism ID, Bacteria STAPHYLOCOCCUS AUREUS  Final      Susceptibility   Staphylococcus aureus - MIC*    CIPROFLOXACIN <=0.5 SENSITIVE Sensitive     ERYTHROMYCIN <=0.25 SENSITIVE Sensitive     GENTAMICIN <=0.5 SENSITIVE Sensitive     OXACILLIN <=0.25 SENSITIVE Sensitive     TETRACYCLINE <=1 SENSITIVE Sensitive     VANCOMYCIN <=0.5 SENSITIVE Sensitive     TRIMETH/SULFA <=10 SENSITIVE Sensitive     CLINDAMYCIN <=0.25 SENSITIVE Sensitive     RIFAMPIN <=0.5 SENSITIVE Sensitive     Inducible Clindamycin NEGATIVE Sensitive     * STAPHYLOCOCCUS AUREUS  Blood Culture ID Panel (Reflexed)     Status: Abnormal   Collection Time: 12/27/2018  1:58 PM  Result Value Ref Range Status   Enterococcus species NOT DETECTED NOT DETECTED Final   Listeria monocytogenes NOT DETECTED NOT DETECTED Final   Staphylococcus species DETECTED (A) NOT DETECTED Final     Comment: CRITICAL RESULT CALLED TO, READ BACK BY AND VERIFIED WITH: J. LEDFORD,PHARMD 0209 12/21/2018 T. Rosalia Hammers  Staphylococcus aureus (BCID) DETECTED (A) NOT DETECTED Final    Comment: Methicillin (oxacillin) susceptible Staphylococcus aureus (MSSA). Preferred therapy is anti staphylococcal beta lactam antibiotic (Cefazolin or Nafcillin), unless clinically contraindicated. CRITICAL RESULT CALLED TO, READ BACK BY AND VERIFIED WITH: J. LEDFORD,PHARMD 0209 12/21/2018 T. TYSOR    Methicillin resistance NOT DETECTED NOT DETECTED Final   Streptococcus species NOT DETECTED NOT DETECTED Final   Streptococcus agalactiae NOT DETECTED NOT DETECTED Final   Streptococcus pneumoniae NOT DETECTED NOT DETECTED Final   Streptococcus pyogenes NOT DETECTED NOT DETECTED Final   Acinetobacter baumannii NOT DETECTED NOT DETECTED Final   Enterobacteriaceae species NOT DETECTED NOT DETECTED Final   Enterobacter cloacae complex NOT DETECTED NOT DETECTED Final   Escherichia coli NOT DETECTED NOT DETECTED Final   Klebsiella oxytoca NOT DETECTED NOT DETECTED Final   Klebsiella pneumoniae NOT DETECTED NOT DETECTED Final   Proteus species NOT DETECTED NOT DETECTED Final   Serratia marcescens NOT DETECTED NOT DETECTED Final   Haemophilus influenzae NOT DETECTED NOT DETECTED Final   Neisseria meningitidis NOT DETECTED NOT DETECTED Final   Pseudomonas aeruginosa NOT DETECTED NOT DETECTED Final   Candida albicans NOT DETECTED NOT DETECTED Final   Candida glabrata NOT DETECTED NOT DETECTED Final   Candida krusei NOT DETECTED NOT DETECTED Final   Candida parapsilosis NOT DETECTED NOT DETECTED Final   Candida tropicalis NOT DETECTED NOT DETECTED Final    Comment: Performed at [facility], [place]. [address], [place], [place] [address]  MRSA PCR Screening     Status: None   Collection Time: 12/29/2018  3:15 PM  Result Value Ref Range Status   MRSA by PCR NEGATIVE NEGATIVE Final    Comment:        The GeneXpert MRSA  Assay (FDA approved for NASAL specimens only), is one component of a comprehensive MRSA colonization surveillance program. It is not intended to diagnose MRSA infection nor to guide or monitor treatment for MRSA infections. Performed at [facility], [place] [address], [place], [place] [address]   Urine Culture     Status: None   Collection Time: 12/21/18 11:04 PM  Result Value Ref Range Status   Specimen Description URINE, CATHETERIZED  Final   Special Requests NONE  Final   Culture   Final    NO GROWTH Performed at [facility], [address] [place], [place] [address]    Report Status 12/22/2018 FINAL  Final  Culture, blood (routine x 2)     Status: None   Collection Time: 12/23/18  6:14 AM  Result Value Ref Range Status   Specimen Description BLOOD RIGHT ARM  Final   Special Requests   Final    BOTTLES DRAWN AEROBIC ONLY Blood Culture adequate volume   Culture   Final    NO GROWTH 5 DAYS Performed at [facility], [place] [address], [place], [place] [address]    Report Status 12/28/2018 FINAL  Final  Culture, blood (routine x 2)     Status: None   Collection Time: 12/23/18  6:50 AM  Result Value Ref Range Status   Specimen Description BLOOD RIGHT ARM  Final   Special Requests   Final    BOTTLES DRAWN AEROBIC ONLY Blood Culture adequate volume   Culture   Final    NO GROWTH 5 DAYS Performed at [facility], [place] [address] [place],  [address]    Report Status 12/28/2018 FINAL  Final  Culture, blood (  Routine X 2) w Reflex to ID Panel     Status: None (Preliminary result)   Collection Time: 12/28/18 10:15 PM  Result Value Ref Range Status   Specimen Description BLOOD RIGHT HAND  Final   Special Requests   Final    BOTTLES DRAWN AEROBIC ONLY Blood Culture adequate volume   Culture   Final    NO GROWTH < 12 HOURS Performed at [facility], [place] [address] [place], [place] [address]    Report Status PENDING  Incomplete  Culture,  blood (Routine X 2) w Reflex to ID Panel     Status: None (Preliminary result)   Collection Time: 12/28/18 10:20 PM  Result Value Ref Range Status   Specimen Description BLOOD RIGHT HAND  Final   Special Requests   Final    BOTTLES DRAWN AEROBIC ONLY Blood Culture adequate volume   Culture   Final    NO GROWTH < 12 HOURS Performed at [facility], [place] [address], [place], [place] [address]    Report Status PENDING  Incomplete    Janene Madeira, MSN, NP-[facility] Cell: [phone] Pager: [phone]  12/29/2018  11:22 AM

## 2018-12-29 NOTE — Consult Note (Signed)
[address]       [place] [address]             [phone]          CARDIOTHORACIC SURGERY CONSULTATION REPORT  PCP is Helen Hashimoto., MD Referring Provider is Garvin Fila, MD Primary Cardiologist is Jenne Campus, MD  Reason for consultation:  Bacterial endocarditis  HPI:  Patient is a 58 year old African-American male with history of end-stage renal disease on hemodialysis, longstanding hypertension, chronic anemia, and paroxysmal atrial fibrillation who was admitted to the hospital Dec 20, 9447 with acute embolic stroke with blood cultures positive for methicillin sensitive Staphylococcus aureus who has been referred for surgical consultation due to presumed bacterial endocarditis.  Patient has longstanding history of hypertension and ultimately developed stage V chronic kidney disease requiring initiation of hemodialysis in 2015.  He was reportedly being evaluated for possible kidney transplantation at [facility] although documentation in the chart includes reference to the fact that the patient was felt to be a "borderline candidate" due to social circumstances.  The patient was evaluated by Dr. Agustin Cree in January of this year for preoperative cardiac clearance because of a reported history of paroxysmal atrial fibrillation.  He was in sinus rhythm at the time and not on oral anticoagulation.  Echocardiogram performed at that time revealed normal left ventricular systolic function with aortic valve sclerosis and possible minimal aortic stenosis.  Stress test was planned but never completed.  Patient was reportedly in his usual state of health until Dec 20, 2018 when he presented acutely to the emergency department with left hemiparesis, left facial droop, and dysarthria.  He reportedly was notably severely weak and sustained a fall at home.  He was febrile with temperature 100.3 F on presentation and shaking and uncontrollably  with chills and rigor.  Code stroke was called.  CT angiogram revealed subbranch occlusion of the right middle cerebral artery.  He was felt not a candidate for thrombolytic therapy and intubated acutely in the emergency department and taken to interventional radiology.  He was found to have tapered occlusion of a branch of the right middle cerebral artery that was not amenable to PTA.  Follow-up MRI revealed multiple bilateral ischemic infarcts consistent with embolization.  Blood cultures obtained at the time of admission grew methicillin sensitive Staphylococcus aureus.  Transthoracic echocardiogram revealed normal left ventricular systolic function with ejection fraction estimated 65% and moderate left ventricular hypertrophy.  There was severe mitral regurgitation associated with severe posterior mitral annular calcification and likely mobile large calcification that appeared to be partially detached, potentially representing a source of embolization.  Cardiothoracic surgical consultation was requested.  Patient remains intubated and heavily sedated on the ventilator with high-dose Levophed for blood pressure support.  He is unresponsive to verbal and mild noxious stimuli.  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)     Past Surgical History:  Procedure Laterality Date   AV FISTULA PLACEMENT Left 09/22/2013  Procedure: ARTERIOVENOUS (AV) FISTULA CREATION RADIOCEPHALIC;  Surgeon: Elam Dutch, MD;  Location: [facility] OR;  Service: Vascular;  Laterality: Left;    DIALYSIS FISTULA CREATION     INSERTION OF DIALYSIS CATHETER Right 09/22/2013   Procedure: INSERTION OF DIALYSIS CATHETER; ULTRASOUND GUIDED;  Surgeon: Elam Dutch, MD;  Location: [facility] OR;  Service: Vascular;  Laterality: Right;   IR ANGIO INTRA EXTRACRAN SEL COM CAROTID INNOMINATE BILAT MOD SED  12/19/2018   IR ANGIO VERTEBRAL SEL SUBCLAVIAN INNOMINATE UNI R MOD SED  12/16/2018   IR ANGIO VERTEBRAL SEL VERTEBRAL UNI L MOD SED  12/29/2018   IR GENERIC HISTORICAL  12/14/2015   IR RADIOLOGIST EVAL & MGMT 12/14/2015 Aletta Edouard, MD [facility] INTERV RAD   RADIOLOGY WITH ANESTHESIA N/A 01/08/2019   Procedure: RADIOLOGY WITH ANESTHESIA;  Surgeon: Luanne Bras, MD;  Location: [facility];  Service: Radiology;  Laterality: N/A;   REMOVAL OF A DIALYSIS CATHETER Right 09/22/2013   Procedure: REMOVAL OF A DIALYSIS CATHETER OF TEMPORARY RIGHT INTERNAL JUGULAR ;  Surgeon: Elam Dutch, MD;  Location: [facility] OR;  Service: Vascular;  Laterality: Right;    Family History  Problem Relation Age of Onset   Hypertension Father     Social History   Socioeconomic History   Marital status: Divorced    Spouse name: Not on file   Number of children: Not on file   Years of education: Not on file   Highest education level: Not on file  Occupational History   Not on file  Social Needs   Financial resource strain: Not on file   Food insecurity:    Worry: Not on file    Inability: Not on file   Transportation needs:    Medical: Not on file    Non-medical: Not on file  Tobacco Use   Smoking status: Never Smoker   Smokeless tobacco: Never Used  Substance and Sexual Activity   Alcohol use: No   Drug use: No   Sexual activity: Not on file  Lifestyle   Physical activity:    Days per week: Not on file    Minutes per session: Not on file   Stress: Not on file  Relationships   Social connections:    Talks on phone: Not on file    Gets together: Not on file    Attends religious  service: Not on file    Active member of club or organization: Not on file    Attends meetings of clubs or organizations: Not on file    Relationship status: Not on file   Intimate partner violence:    Fear of current or ex partner: Not on file    Emotionally abused: Not on file    Physically abused: Not on file    Forced sexual activity: Not on file  Other Topics Concern   Not on file  Social History Narrative   Not on file    Prior to Admission medications   Medication Sig Start Date End Date Taking? Authorizing Provider  amLODipine (NORVASC) 10 MG tablet Take 1 tablet (10 mg total) by mouth daily. Patient taking differently: Take 10 mg by mouth at bedtime.  03/05/16   Burns, Arloa Koh, MD  hydrALAZINE (APRESOLINE) 25 MG tablet Take 1 tablet (25 mg total) by mouth every 8 (eight) hours. Patient taking differently: Take 25 mg by mouth See admin instructions. Take one tablet (25 mg) by mouth twice daily as directed 09/23/13   Delfina Redwood, MD  labetalol (NORMODYNE) 100 MG tablet Take 100 mg by mouth 2 (two) times a day.  11/29/18   [provider]  lisinopril (ZESTRIL) 40 MG tablet Take 40 mg by mouth See admin instructions. Take one tablet (40 mg) by mouth every morning or after dialysis 12/18/15   [provider]  promethazine (PHENERGAN) 12.5 MG tablet Take 12.5 mg by mouth daily as needed for nausea or vomiting.    [provider]  sevelamer carbonate (RENVELA) 800 MG tablet Take 2,400 mg by mouth 3 (three) times daily.    [provider]    Current Facility-Administered Medications  Medication Dose Route Frequency Provider Last Rate Last Dose    prismasol BGK 4/2.5 infusion   CRRT Continuous Corliss Parish, MD 300 mL/hr at 12/28/18 1615      prismasol BGK 4/2.5 infusion   CRRT Continuous Corliss Parish, MD 300 mL/hr at 12/28/18 1558     0.9 %  sodium chloride infusion   Intravenous PRN Chesley Mires, MD   Stopped at 12/27/18  0427   acetaminophen (TYLENOL) tablet 650 mg  650 mg Oral Q4H PRN Vonzella Nipple, NP       Or   acetaminophen (TYLENOL) solution 650 mg  650 mg Per Tube Q4H PRN Vonzella Nipple, NP   650 mg at 12/24/18 1633   Or   acetaminophen (TYLENOL) suppository 650 mg  650 mg Rectal Q4H PRN Vonzella Nipple, NP   650 mg at 01/08/2019 1316   albuterol (PROVENTIL) (2.5 MG/3ML) 0.083% nebulizer solution 2.5 mg  2.5 mg Nebulization Q2H PRN Clydell Hakim, MD   2.5 mg at 12/23/18 1631   amiodarone (PACERONE) tablet 200 mg  200 mg Per Tube BID Cristal Generous, NP   200 mg at 12/28/18 2203   chlorhexidine gluconate (MEDLINE KIT) (PERIDEX) 0.12 % solution 15 mL  15 mL Mouth Rinse BID Kerney Elbe, MD   15 mL at 12/28/18 2014   Chlorhexidine Gluconate Cloth 2 % PADS 6 each  6 each Topical Daily Chesley Mires, MD   6 each at 12/29/18 0024   Chlorhexidine Gluconate Cloth 2 % PADS 6 each  6 each Topical Q0600 Corliss Parish, MD       Chlorhexidine Gluconate Cloth 2 % PADS 6 each  6 each Topical Daily Rosalin Hawking, MD   6 each at 12/29/18 0024   Darbepoetin Alfa (ARANESP) injection 150 mcg  150 mcg Intravenous Q Thu-HD Corliss Parish, MD       dexmedetomidine (PRECEDEX) 400 MCG/100ML (4 mcg/mL) infusion  0.4-2 mcg/kg/hr Intravenous Continuous Garvin Fila, MD 33.6 mL/hr at 12/29/18 0700 1.4 mcg/kg/hr at 12/29/18 0700   feeding supplement (PRO-STAT SUGAR FREE 64) liquid 60 mL  60 mL Per Tube QID Chesley Mires, MD   60 mL at 12/28/18 2203   feeding supplement (VITAL AF 1.2 CAL) liquid 1,000 mL  1,000 mL Per Tube Continuous Olalere, Adewale A, MD 50 mL/hr at 12/29/18 0500 1,000 mL at 12/29/18 0500   fentaNYL (SUBLIMAZE) injection 50-200 mcg  50-200 mcg Intravenous Q30 min PRN Clydell Hakim, MD   100 mcg at 12/26/18 0525   fentaNYL 2551mg in NS 257m(1032mml) infusion-PREMIX  0-400 mcg/hr Intravenous Titrated SomAnders SimmondsD 30 mL/hr at 12/29/18 0700 300 mcg/hr at 12/29/18 0700    heparin 10,000 units/ 20 mL infusion syringe  250-3,000 Units/hr CRRT Continuous GolCorliss ParishD 3.5 mL/hr at 12/29/18 0400 1,750 Units/hr at 12/29/18 0400   heparin bolus via infusion  syringe 1,000 Units  1,000 Units CRRT PRN Corliss Parish, MD   1,000 Units at 12/27/18 1329   heparin injection 1,000-6,000 Units  1,000-6,000 Units CRRT PRN Corliss Parish, MD       heparinized saline (2000 units/L) primer fluid for CRRT   CRRT PRN Corliss Parish, MD       ipratropium (ATROVENT) nebulizer solution 0.5 mg  0.5 mg Nebulization Q2H PRN Clydell Hakim, MD       LORazepam (ATIVAN) injection 2 mg  2 mg Intravenous Q6H PRN Olalere, Adewale A, MD   2 mg at 12/29/18 0010   MEDLINE mouth rinse  15 mL Mouth Rinse 10 times per day Kerney Elbe, MD   15 mL at 12/29/18 0602   metoprolol tartrate (LOPRESSOR) injection 2.5-5 mg  2.5-5 mg Intravenous Q3H PRN Deterding, Guadelupe Sabin, MD   5 mg at 12/28/18 0906   nafcillin 2 g in sodium chloride 0.9 % 100 mL IVPB  2 g Intravenous Q4H Minda Ditto, RPH   Stopped at 12/29/18 1610   norepinephrine (LEVOPHED) 32m in 2566mpremix infusion  0-40 mcg/min Intravenous Titrated SoChesley MiresMD 18.75 mL/hr at 12/29/18 0700 5 mcg/min at 12/29/18 0700   pantoprazole sodium (PROTONIX) 40 mg/20 mL oral suspension 40 mg  40 mg Per Tube Q24H SoChesley MiresMD   40 mg at 12/28/18 099604 prismasol BGK 4/2.5 infusion   CRRT Continuous GoCorliss ParishMD 2,000 mL/hr at 12/29/18 0602     senna-docusate (Senokot-S) tablet 1 tablet  1 tablet Oral QHS PRN WiVonzella NippleNP   1 tablet at 12/24/18 095409 sodium chloride 0.9 % primer fluid for CRRT   CRRT PRN GoCorliss ParishMD       sodium chloride flush (NS) 0.9 % injection 10-40 mL  10-40 mL Intracatheter Q12H SoChesley MiresMD   10 mL at 12/28/18 2204   sodium chloride flush (NS) 0.9 % injection 10-40 mL  10-40 mL Intracatheter PRN SoChesley MiresMD       sodium chloride flush (NS)  0.9 % injection 3 mL  3 mL Intravenous Once MiNoemi ChapelMD        No Active Allergies    Review of Systems:  Per HPI - remainder not obtainable at present    Physical Exam:   BP 122/85    Pulse 96    Temp 97.6 F (36.4 C) (Axillary)    Resp 18    Ht '5\' 9"'  (1.753 m)    Wt 96 kg    SpO2 98%    BMI 31.25 kg/m   General:  Intubated and comatose on ventilator  HEENT:  Unremarkable  Neck:   no JVD, no bruits, no adenopathy   Chest:   Coarse but symmetricl breath sounds, no wheezes, no rhonchi   CV:   Tachycardic, + systolic murmur   Abdomen:  soft, non-distended  Extremities:  warm, well-perfused, pulses not palpable, no lower extremity edema  Rectal/GU  Deferred  Neuro:   Comatose on vent  Skin:   Clean and dry, no rashes, no breakdown  Diagnostic Tests:  Lab Results:   Blood Cultures:  Results for SITAELYN, NEMESMRN [id number] of 12/29/2018 07:49  Ref. Range 12/15/2018 13:58  Organism ID, Bacteria Unknown STAPHYLOCOCCUS AUREUS     Recent Labs    12/28/18 0616 12/29/18 0500  WBC 48.5* 46.1*  HGB 8.1* 7.1*  HCT 24.3* 21.9*  PLT 144* 147*   BMET:  Recent Labs  12/28/18 1507 12/29/18 0500  NA 139 138  K 4.7 4.4  CL 103 102  CO2 25 25  GLUCOSE 126* 142*  BUN 50* 43*  CREATININE 2.61* 2.15*  CALCIUM 8.7* 8.8*    CBG (last 3)  Recent Labs    12/28/18 1933 12/28/18 2326 12/29/18 0324  GLUCAP 97 107* 119*   PT/INR:  No results for input(s): LABPROT, INR in the last 72 hours.  CXR:  PORTABLE CHEST 1 VIEW  COMPARISON:  12/27/2018  FINDINGS: Endotracheal tube terminates 4 cm above the carina. Bilateral jugular catheters are unchanged. An enteric tube courses into the upper abdomen. The cardiac silhouette is borderline enlarged. There is aortic atherosclerosis. Interstitial and patchy airspace opacities throughout both lungs have mildly to moderately increased in the interim. No sizable pleural effusion or pneumothorax is identified with note  made incomplete imaging of the left costophrenic angle.  IMPRESSION: Worsening bilateral airspace disease.   Electronically Signed   By: Logan Bores M.D.   On: 12/28/2018 09:13      ECHOCARDIOGRAM REPORT    Patient Name:   Thomas Adams Date of Exam: 09/24/2018 Medical Rec #:  [id number]    Height:       69.0 in Accession #:    [id number]   Weight:       195.8 lb Date of Birth:  July 10, 1961    BSA:          2.05 m Patient Age:    58 years     BP:           150/80 mmHg Patient Gender: M            HR:           81 bpm. 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 Acute renal failure; Risk Factors:                 Hypertension.   Sonographer:    Luane School Referring Phys: [place]    1. The left ventricle has normal systolic function with an ejection fraction of 60-65%. The cavity size was normal. There is moderately increased left ventricular wall thickness. Left ventricular diastolic Doppler parameters are consistent with impaired  relaxation.  2. The right ventricle has normal systolic function. The cavity was normal. There is no increase in right ventricular wall thickness.  3. Left atrial size was moderately dilated.  4. The mitral valve is normal in structure.  5. The tricuspid valve is normal in structure.  6. The aortic valve is tricuspid There is mild stenosis of the aortic valve.  7. The pulmonic valve was normal in structure.  8. There is mild dilatation of the ascending aorta measuring 37 mm.  9. Right atrial pressure is estimated at 3 mmHg.  FINDINGS  Left Ventricle: The left ventricle has normal systolic function,  with an ejection fraction of 60-65%. The cavity size was normal. There  is moderately increased left ventricular wall thickness. Left ventricular diastolic Doppler parameters are consistent  with impaired relaxation Right Ventricle: The right ventricle has normal systolic function. The cavity was normal. There is no increase in right ventricular wall thickness. Left Atrium: left atrial size was moderately dilated Right Atrium: right atrial size was normal in size Right atrial pressure is estimated at 3 mmHg. Interatrial Septum: No atrial level shunt detected by color flow Doppler. Pericardium: There is no evidence of pericardial effusion. Mitral Valve: The mitral valve is normal in structure. Mitral valve regurgitation is not visualized by color flow Doppler. Tricuspid Valve: The tricuspid valve is normal in structure. Tricuspid valve regurgitation was not visualized by color flow Doppler. Aortic Valve: The aortic valve is tricuspid Aortic valve regurgitation was not visualized by color flow Doppler. There is mild stenosis of the aortic valve, with a calculated valve area of 2.86 cm. Pulmonic Valve: The pulmonic valve was normal in structure. Pulmonic valve regurgitation is not visualized by color flow Doppler. Aorta: There is mild dilatation of the ascending aorta measuring 37 mm. Venous: The inferior vena cava measures 1.50 cm, is normal in size with greater than 50% respiratory variability.   LEFT VENTRICLE PLAX 2D (Te[place]) LV EF:          73.8 %   Diastology LVIDd:          4.90 cm  LV e' lateral:   7.01 cm/s LVIDs:          2.80 cm  LV E/e' lateral: 14.1 LV PW:          1.80 cm  LV e' medial:    4.29 cm/s LV IVS:         1.90 cm  LV E/e' medial:  23.0 LVOT diam:      2.30 cm LV SV:          83 ml LVOT Area:      4.15 cm  RIGHT VENTRICLE RV S prime:     10.40 cm/s TAPSE (M-mode): 3.0 cm RVSP:           15.7 mmHg  LEFT ATRIUM             Index       RIGHT ATRIUM           Index LA  diam:        5.20 cm 2.54 cm/m  RA Pressure: 3 mmHg LA Vol (A2C):   82.5 ml 40.29 ml/m RA Area:     16.10 cm LA Vol (A4C):   79.4 ml 38.78 ml/m RA Volume:   42.00 ml  20.51 ml/m LA Biplane Vol: 82.2 ml 40.15 ml/m  AORTIC VALVE AV Area (Vmax):    2.13 cm AV Area (Vmean):   2.03 cm AV Area (VTI):     2.86 cm AV Vmax:           249.50 cm/s AV Vmean:          154.000 cm/s AV VTI:            0.412 m AV Peak Grad:      24.9 mmHg AV Mean Grad:      11.5 mmHg LVOT Vmax:         128.00 cm/s LVOT Vmean:        75.400 cm/s LVOT VTI:          0.283 m LVOT/AV VTI ratio: 0.69   AORTA Ao Root diam: 3.30 cm Ao Asc diam:  3.70 cm  MITRAL VALVE               TRICUSPID VALVE MV Area (PHT): 3.08 cm    TR Peak grad:   12.7 mmHg MV PHT:        71.34 msec  TR Vmax:        182.00 cm/s MV Decel Time: 246 msec    RVSP:           15.7 mmHg MV E velocity: 98.50 cm/s MV A velocity: 102.00 cm/s MV E/A ratio:  0.97  IVC IVC diam: 1.50 cm    Jyl Heinz MD Electronically signed by Jyl Heinz MD Signature Date/Time: 09/24/2018/4:37:22 PM        ECHOCARDIOGRAM REPORT    Patient Name:   Thomas Adams Date of Exam: 01/04/2019 Medical Rec #:  [id number]    Height:       69.0 in Accession #:    [id number]   Weight:       188.1 lb Date of Birth:  09-22-1960    BSA:          2.01 m Patient Age:    78 years     BP:           109/78 mmHg Patient Gender: M            HR:           105 bpm. Exam Location:  Inpatient    Procedure: 2D Echo, Color Doppler and Cardiac Doppler  Indications:    Stroke.   History:        Patient has prior history of Echocardiogram examinations, most                 recent 09/24/2018. Risk Factors: Hypertension. CKD.   Sonographer:    Jonelle Sidle Dance Referring Phys: [id number] [place]    1. The left ventricle has hyperdynamic systolic function, with an ejection fraction of >65%. The cavity size was normal. There is severe  concentric left ventricular hypertrophy. Left ventricular diastolic Doppler parameters are consistent with impaired  relaxation. Elevated left atrial and left ventricular end-diastolic pressures.  2. The right ventricle has normal systolic function. The cavity was normal. There is no increase in right ventricular wall thickness.  3. Left atrial size was moderately dilated.  4. Right atrial size was mildly dilated.  5. The mitral valve is degenerative. Severe thickening of the mitral valve leaflet. Severe calcification of the mitral valve leaflet. There is severe mitral annular calcification present. Mitral valve regurgitation is severe by color flow Doppler.  6. The aortic valve is tricuspid. Severely thickening of the aortic valve. Severe calcifcation of the aortic valve. Aortic valve regurgitation was not assessed by color flow Doppler.  SUMMARY   Severe concentric LVH, hyperdynamic LVEF. Aortic valve is severely thickened and calcified. There are severe posterior mitral annular calcifications extending into the mitral lefalets with a mobile portion that most probably represents a large calcified mass partially detached from the leaflet. This can possibly be a source of embolism and stroke. Evaluation for TTR amyloidosis with PYP nuclear scan should be considered.  FINDINGS  Left Ventricle: The left ventricle has hyperdynamic systolic function, with an ejection fraction of >65%. The cavity size was normal. There is severe concentric left ventricular hypertrophy. Left ventricular diastolic Doppler parameters are consistent  with impaired relaxation. Elevated left atrial and left ventricular end-diastolic pressures  Right Ventricle: The right ventricle has normal systolic function. The  cavity was normal. There is no increase in right ventricular wall thickness.  Left Atrium: Left atrial size was moderately dilated.  Right Atrium: Right atrial size was mildly dilated. Right atrial  pressure is estimated at 3 mmHg.  Interatrial Septum: No atrial level shunt detected by color flow Doppler.  Pericardium: There is no evidence of pericardial effusion.  Mitral Valve: The mitral valve is degenerative in appearance. Severe thickening of the mitral valve leaflet. Severe calcification of the mitral valve leaflet. There is severe mitral annular calcification present. Mitral valve regurgitation is severe by  color flow Doppler.  Tricuspid Valve: The tricuspid valve is normal in structure. Tricuspid valve regurgitation is mild by color flow Doppler.  Aortic Valve: The aortic valve is tricuspid Severely thickening of the aortic valve. Severe calcifcation of the aortic valve. Aortic valve regurgitation was not assessed by color flow Doppler.  Pulmonic Valve: The pulmonic valve was grossly normal. Pulmonic valve regurgitation is trivial by color flow Doppler.  Venous: The inferior vena cava is normal in size with greater than 50% respiratory variability.    +--------------+--------++  LEFT VENTRICLE            +----------------+---------++ +--------------+--------++  Diastology                    PLAX 2D                   +----------------+---------++ +--------------+--------++  LV e' lateral:   5.55 cm/s    LVIDd:         4.65 cm    +----------------+---------++ +--------------+--------++  LV E/e' lateral: 21.6         LVIDs:         2.54 cm    +----------------+---------++ +--------------+--------++  LV e' medial:    4.68 cm/s    LV PW:         1.62 cm    +----------------+---------++ +--------------+--------++  LV E/e' medial:  25.6         LV IVS:        1.06 cm    +----------------+---------++ +--------------+--------++  LVOT diam:     2.30 cm    +--------------+--------++  LV SV:         77 ml      +--------------+--------++  LV SV Index:   37.27      +--------------+--------++  LVOT Area:     4.15 cm   +--------------+--------++                             +--------------+--------++  +---------------+----------++  RIGHT VENTRICLE              +---------------+----------++  RV Basal diam:  2.53 cm      +---------------+----------++  RV S prime:     13.90 cm/s   +---------------+----------++  TAPSE (M-mode): 2.4 cm       +---------------+----------++  +---------------+--------++-----------++  LEFT ATRIUM               Index         +---------------+--------++-----------++  LA diam:        4.60 cm   2.29 cm/m    +---------------+--------++-----------++  LA Vol (A2C):   114.0 ml  56.64 ml/m   +---------------+--------++-----------++  LA Vol (A4C):   63.9 ml   31.75 ml/m   +---------------+--------++-----------++  LA Biplane Vol: 87.1 ml   43.28 ml/m   +---------------+--------++-----------++ +------------+---------++-----------++  RIGHT ATRIUM  Index         +------------+---------++-----------++  RA Area:     13.90 cm                +------------+---------++-----------++  RA Volume:   33.50 ml   16.64 ml/m   +------------+---------++-----------++  +------------+-----------++  AORTIC VALVE               +------------+-----------++  LVOT Vmax:   117.00 cm/s   +------------+-----------++  LVOT Vmean:  72.300 cm/s   +------------+-----------++  LVOT VTI:    0.208 m       +------------+-----------++   +-------------+-------++  AORTA                   +-------------+-------++  Ao Root diam: 3.10 cm   +-------------+-------++  Ao Asc diam:  3.30 cm   +-------------+-------++  +--------------+----------++  MITRAL VALVE                 +--------------+-------+ +--------------+----------++   SHUNTS                   MV Area (PHT): 3.77 cm      +--------------+-------+ +--------------+----------++   Systemic VTI:  0.21 m    MV PHT:        58.29 msec    +--------------+-------+ +--------------+----------++   Systemic Diam: 2.30 cm   MV Decel Time: 201 msec       +--------------+-------+ +--------------+----------++ +--------------+-----------++  MV E velocity: 120.00 cm/s   +--------------+-----------++  MV A velocity: 125.00 cm/s   +--------------+-----------++  MV E/A ratio:  0.96          +--------------+-----------++    Ena Dawley MD Electronically signed by Ena Dawley MD Signature Date/Time: 01/04/2019/9:12:58 PM       CT ANGIOGRAPHY HEAD AND NECK  CT PERFUSION BRAIN  TECHNIQUE: Multidetector CT imaging of the head and neck was performed using the standard protocol during bolus administration of intravenous contrast. Multiplanar CT image reconstructions and MIPs were obtained to evaluate the vascular anatomy. Carotid stenosis measurements (when applicable) are obtained utilizing NASCET criteria, using the distal internal carotid diameter as the denominator.  Multiphase CT imaging of the brain was performed following IV bolus contrast injection. Subsequent parametric perfusion maps were calculated using RAPID software.  CONTRAST:  152m OMNIPAQUE IOHEXOL 350 MG/ML SOLN  COMPARISON:  None.  FINDINGS: CTA NECK FINDINGS  The study is motion degraded including moderate to severe motion artifact through the larynx which limits assessment of the carotid bifurcations.  Aortic arch: Standard 3 vessel aortic arch with mild atherosclerotic plaque. No significant arch vessel origin stenosis.  Right carotid system: Patent with moderate calcified atherosclerosis about the carotid bifurcation. No evidence of dissection or significant stenosis within limitations of motion artifact.  Left carotid system: Patent with mild calcified atherosclerosis about the carotid bifurcation without evidence of significant stenosis or dissection.  Vertebral arteries: Patent with the left being mildly dominant. Calcified atherosclerosis at the vertebral artery origins results in moderate to severe stenosis  bilaterally.  Skeleton: Mild cervical disc degeneration.  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  Upper chest: Clear lung apices.  Review of the MIP images confirms the above findings  CTA HEAD FINDINGS  Anterior circulation: The internal carotid arteries are patent from skull base to carotid termini with calcified atherosclerosis resulting in mild cavernous stenosis on the right. The right M1 segment is widely patent. Branch vessel assessment is limited by motion artifact,  however there is a suspected mid right M2 branch vessel occlusion. The left MCA appears duplicated with suggestion of severe stenosis of the more superior origin although motion artifact may be contributing to this appearance. ACAs are patent without evidence of flow limiting proximal stenosis. No aneurysm is identified.  Posterior circulation: The intracranial vertebral arteries are patent to the basilar without evidence of significant stenosis. P ICAs and a ICAs are not well evaluated. SCA is are patent. The basilar artery is widely patent. There is a small right posterior communicating artery. PCAs are patent without evidence of flow limiting proximal stenosis allowing for suspected artifactual narrowing in the proximal right P2 segment. No aneurysm is identified.  Venous sinuses: Patent.  Anatomic variants: None.  Review of the MIP images confirms the above findings  CT Brain Perfusion Findings:  ASPECTS: 10  CBF (<30%) Volume: 36m  Perfusion (Tmax>6.0s) volume: 937m Mismatch Volume: 61m16mInfarction Location: No core infarct by standard RAPID criteria. Penumbra anterolaterally in the right parietal lobe.  IMPRESSION: 1. Motion degraded examination with limited assessment of the small and medium-sized intracranial arteries. 2. Suspected mid right M2 branch vessel occlusion with 9 mL penumbra in the right parietal lobe and no core infarct  based on CTP. 3. No more proximal large vessel occlusion. 4. Patent cervical carotid arteries without evidence of significant stenosis. 5. 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 6. Possible severe left MCA origin stenosis. 7.  Aortic Atherosclerosis (ICD10-I70.0).  These results were called by telephone at the time of interpretation on 12/24/2018 at 12:29 pm to Dr. ERIKerney Elbewho verbally acknowledged these results.   Electronically Signed   By: AllLogan BoresD.   On: 12/23/2018 12:59     MRI HEAD WITHOUT CONTRAST  TECHNIQUE: Multiplanar, multiecho pulse sequences of the brain and surrounding structures were obtained without intravenous contrast.  COMPARISON:  CTA head neck 12/27/2018  FINDINGS: BRAIN: Multifocal abnormal diffusion restriction within both cerebellar hemispheres and scattered throughout multiple bilateral supratentorial territories. The largest area of ischemia is in the right frontal operculum and insula. The midline structures are normal. No midline shift or other mass effect. Early confluent hyperintense T2-weighted signal of the periventricular and deep white matter, most commonly due to chronic ischemic microangiopathy. Generalized atrophy without lobar predilection. Multiple scattered foci of microhemorrhage, many of which correspond to areas of ischemia. No mass lesion.  VASCULAR: Abnormal flow void within the right transverse sinus. The arterial flow voids are preserved.  SKULL AND UPPER CERVICAL SPINE: Calvarial bone marrow signal is normal. There is no skull base mass. Visualized upper cervical spine and soft tissues are normal.  SINUSES/ORBITS: Fluid level in the maxillary sinuses. The orbits are normal.  IMPRESSION: 1. Multifocal acute ischemia scattered throughout both cerebral and cerebellar hemispheres, with the largest area located in the right frontal operculum and insula. 2.  Multifocal petechiae/microhemorrhage at the sites of ischemic infarction. No intraparenchymal hematoma or mass effect. 3. Abnormal flow void in the right transverse sinus could indicate dural venous sinus thrombosis. However, on the earlier CTA and the interventional angiogram, the sinus was clearly patent.   Electronically Signed   By: KevUlyses JarredD.   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  He remains heavily sedated  and essentially comatose on the ventilator on high-dose pressor support at this time.  Blood cultures on medical therapy have remained no growth and the patient has defervesced but white blood count continues to climb.  I have personally reviewed the patient's chart, lab work, transthoracic echocardiograms, CT angiogram of the head and brain, and MRI of the brain.  Echocardiogram performed in February of this year revealed normal left ventricular systolic function with aortic valve sclerosis and minimal aortic stenosis.  There was no mitral regurgitation at the time although there was severe mitral annular calcification.  Echocardiogram performed at the time of hospital admission reveals severe mitral regurgitation with what appears to be ruptured endothelial surface and calcified posterior annulus that presumably is infected and likely source of embolization.  Transesophageal echocardiogram has not been performed but the patient meets modified Duke criteria for the presumed diagnosis of bacterial endocarditis.  The patient has not developed significant AV conduction delay or heart block.    The patient would not be considered a candidate for surgical intervention at the present time.  Prognosis appears  very poor.    Plan:  Per medical teams.  If the patient recovers from this acute event and demonstrates meaningful neurologic recovery and reasonable long-term prognosis we would be willing to reevaluate the patient to consider surgical intervention in the future.  I discussed matters at length over the telephone with the patient's mother, Mrs. Thomas Adams.  All questions answered.  Please call if further questions arise.   I spent in excess of 90 minutes during the conduct of this hospital consultation and >50% of this time involved direct face-to-face encounter for counseling and/or coordination of the patient's care.    Valentina Gu. Roxy Manns, MD 12/29/2018 7:48 AM

## 2018-12-29 NOTE — Consult Note (Signed)
Consultation Note Date: 12/29/2018   Patient Name: Thomas Adams  DOB: 1960/09/29  MRN: [id number]  Age / Sex: 58 y.o., male  PCP: Helen Hashimoto., MD Referring Physician: Rosalin Hawking, MD  Reason for Consultation: Establishing goals of care  HPI/Patient Profile: 58 y.o. male  with past medical history of a fib not on anticoagulation, HTN, and ESRD on HD admitted on 12/21/2018 with left facial droop and left sided weakness. Found to have R MCA branch occlusion not amenable to thrombectomy and not a tPA candidate d/t unknown time. Also found to be febrile with elevated white count. Strokes found to be d/t left sided endocarditis. Patient is not a surgical candidate d/t condition and poor prognosis. Patient has been vent dependent since 5/19 and requiring pressors off and on. PMT consulted for [place].   Clinical Assessment and Goals of Care: I have reviewed medical records including EPIC notes, labs and imaging, received report from RN and Dr. Erlinda Hong, and then spoke with patient's mother, Stanton Kidney, to discuss diagnosis prognosis, Joes, EOL wishes, disposition and options.  Per RN report - patient's mother has informed her that the patient does have children however they have not been involved in his life. Stanton Kidney has been serving as his Media planner.   I introduced Palliative Medicine as specialized medical care for people living with serious illness. It focuses on providing relief from the symptoms and stress of a serious illness. The goal is to improve quality of life for both the patient and the family.  We discussed a brief life review of the patient. Stanton Kidney shares that the patient lives alone and had been doing "okay".  She tells me about the patient's history with dialysis - she says that the patient had told her many times he was tired of dialysis and wasn't sure if he wanted to continue living this way. She was concerned about his quality of life prior to  his current illness.   As far as functional and nutritional status, tells me he was functional and could care for himself independently. She believes he had a decreased appetite.    We discussed his current illness and what it means in the larger context of his on-going co-morbidities.  Natural disease trajectory and expectations at EOL were discussed. Stanton Kidney has spoken with several of [facility] doctors and nurses and has a good understanding of his current condition. We reviewed clinical conditions and medical support being utilized for [place].   I attempted to elicit values and goals of care important to the patient. Stanton Kidney shares that Seann is independent and this is important to him - she shares that he would not want to live dependent on others.   Stanton Kidney shares that someone had mentioned a feeding tube to her and she immediately says this is something Mauri would never want. She continues to share that Carrington would never want to live in a facility. We discussed the option of a tracheostomy and after describing it to her and answering her questions, she shares that she does not think Jafar would want that either.   Advance directives, concepts specific to code status, artifical feeding and hydration, and rehospitalization were considered and discussed. We thoroughly discussed code status and all of Mary's questions were addressed. After discussion, Stanton Kidney shares that if Fergus were dying she would want him to be allowed to pass naturally without resuscitation attempts. We discussed changing code status to DNR and Stanton Kidney agrees.   Questions and concerns were addressed. The family was  encouraged to call with questions or concerns.   I shared the above conversation and wishes with Dr. Erlinda Hong - plan is to follow up tomorrow and include CCM in discussion. May be at a place to move forward with one way extubation/comfort care. PMT will continue discussions with [facility] tomorrow.   Primary Decision Maker NEXT OF KIN  - patient's mother, Stanton Kidney    SUMMARY OF RECOMMENDATIONS   - Initial discussion with mother, Stanton Kidney - code status changed to DNR and Stanton Kidney shares patient would never want feeding tube, trach, live in a facility - Dr. Erlinda Hong plans to follow up with CCM tomorrow - may be at a place to move forward with one way extubation/comfort care  - PMT will continue to support and discuss [place] with [facility] tomorrow.  Code Status/Advance Care Planning:  DNR  Additional Recommendations (Limitations, Scope, Preferences):  No Tracheostomy  Prognosis:   Unable to determine  Discharge Planning: To Be Determined      Primary Diagnoses: Present on Admission: . Middle cerebral artery embolism, right . Septic shock ([place]) . Acute respiratory failure with hypoxia ([place]) . End stage renal disease ([place]) . Paroxysmal atrial fibrillation ([facility]) . MSSA bacteremia . Bacterial endocarditis, presumed   I have reviewed the medical record, interviewed the patient and family, and examined the patient. The following aspects are pertinent.  Past Medical History:  Diagnosis Date  . Acute ischemic stroke ([place]) 01/10/2019  . Anemia   . Bacterial endocarditis 01/05/2019  . CKD (chronic kidney disease) stage V requiring chronic dialysis ([place]) 09/23/2013   HD every T-Th-S at [facility]  . Complication of anesthesia    " i HAD A HARD TIME WAKING WHEN THEY PUT MY FISTULA IN "  . End stage renal disease ([place]) 11/04/2013  . Hypertension   . Middle cerebral artery embolism, right 12/14/2018  . MSSA bacteremia 01/01/2019  . Paroxysmal atrial fibrillation ([facility])    Social History   Socioeconomic History  . Marital status: Divorced    Spouse name: Not on file  . Number of children: Not on file  . Years of education: Not on file  . Highest education level: Not on file  Occupational History  . Not on file  Social Needs  . Financial resource strain: Not on file  . Food insecurity:    Worry: Not on file     Inability: Not on file  . Transportation needs:    Medical: Not on file    Non-medical: Not on file  Tobacco Use  . Smoking status: Never Smoker  . Smokeless tobacco: Never Used  Substance and Sexual Activity  . Alcohol use: No  . Drug use: No  . Sexual activity: Not on file  Lifestyle  . Physical activity:    Days per week: Not on file    Minutes per session: Not on file  . Stress: Not on file  Relationships  . Social connections:    Talks on phone: Not on file    Gets together: Not on file    Attends religious service: Not on file    Active member of club or organization: Not on file    Attends meetings of clubs or organizations: Not on file    Relationship status: Not on file  Other Topics Concern  . Not on file  Social History Narrative  . Not on file   Family History  Problem Relation Age of Onset  . Hypertension Father    Scheduled Meds: .  amiodarone  200 mg Per Tube BID  . chlorhexidine gluconate (MEDLINE KIT)  15 mL Mouth Rinse BID  . Chlorhexidine Gluconate Cloth  6 each Topical Daily  . darbepoetin (ARANESP) injection - DIALYSIS  150 mcg Intravenous Q Thu-HD  . feeding supplement (PRO-STAT SUGAR FREE 64)  60 mL Per Tube QID  . loperamide HCl  4 mg Per Tube Once  . mouth rinse  15 mL Mouth Rinse 10 times per day  . pantoprazole sodium  40 mg Per Tube Q24H  . QUEtiapine  50 mg Per Tube BID  . sodium chloride flush  10-40 mL Intracatheter Q12H   Continuous Infusions: .  prismasol BGK 4/2.5 300 mL/hr at 12/29/18 0916  .  prismasol BGK 4/2.5 300 mL/hr at 12/29/18 0921  . sodium chloride Stopped (12/27/18 0427)  . dexmedetomidine (PRECEDEX) IV infusion 1.5 mcg/kg/hr (12/29/18 1400)  . feeding supplement (VITAL AF 1.2 CAL) 1,000 mL (12/29/18 0500)  . fentaNYL infusion INTRAVENOUS 275 mcg/hr (12/29/18 1400)  . heparin 10,000 units/ 20 mL infusion syringe 1,750 Units/hr (12/29/18 0928)  . nafcillin IV Stopped (12/29/18 1101)  . norepinephrine (LEVOPHED) Adult  infusion 6 mcg/min (12/29/18 1400)  . prismasol BGK 4/2.5 2,000 mL/hr at 12/29/18 1325   PRN Meds:.sodium chloride, acetaminophen **OR** acetaminophen (TYLENOL) oral liquid 160 mg/5 mL **OR** acetaminophen, albuterol, fentaNYL (SUBLIMAZE) injection, heparin, heparin, heparin, ipratropium, loperamide HCl **FOLLOWED BY** loperamide HCl, LORazepam, metoprolol tartrate, sodium chloride, sodium chloride flush No Active Allergies  Vital Signs: BP 109/62   Pulse (!) 139   Temp 98.3 F (36.8 C) (Axillary)   Resp 19   Ht '5\' 9"'  (1.753 m)   Wt 96 kg   SpO2 (!) 89%   BMI 31.25 kg/m  Pain Scale: CPOT   Pain Score: 0-No pain   SpO2: SpO2: (!) 89 % O2 Device:SpO2: (!) 89 % O2 Flow Rate: .O2 Flow Rate (L/min): 15 L/min  IO: Intake/output summary:   Intake/Output Summary (Last 24 hours) at 12/29/2018 1457 Last data filed at 12/29/2018 1400 Gross per 24 hour  Intake 4437.63 ml  Output 5084 ml  Net -646.37 ml    LBM: Last BM Date: 12/27/18 Baseline Weight: Weight: 87.2 kg Most recent weight: Weight: 96 kg     Palliative Assessment/Data: PPS 30%    The above conversation was completed via telephone due to the visitor restrictions during the COVID-19 pandemic. Thorough chart review and discussion with necessary members of the care team was completed as part of assessment. All issues were discussed and addressed but no physical exam was performed.  Time Total: 70 minutes  Greater than 50%  of this time was spent counseling and coordinating care related to the above assessment and plan.  Juel Burrow, DNP, AGNP-C Palliative Medicine Team [phone] Pager: [phone]

## 2018-12-30 ENCOUNTER — Inpatient Hospital Stay (HOSPITAL_COMMUNITY): Payer: Medicare Other

## 2018-12-30 DIAGNOSIS — D649 Anemia, unspecified: Secondary | ICD-10-CM

## 2018-12-30 DIAGNOSIS — I639 Cerebral infarction, unspecified: Secondary | ICD-10-CM

## 2018-12-30 DIAGNOSIS — Z515 Encounter for palliative care: Secondary | ICD-10-CM

## 2018-12-30 LAB — RENAL FUNCTION PANEL
Albumin: 1.6 g/dL — ABNORMAL LOW (ref 3.5–5.0)
Albumin: 1.6 g/dL — ABNORMAL LOW (ref 3.5–5.0)
Anion gap: 13 (ref 5–15)
Anion gap: 12 (ref 5–15)
BUN: 43 mg/dL — ABNORMAL HIGH (ref 6–20)
BUN: 44 mg/dL — ABNORMAL HIGH (ref 6–20)
CO2: 22 mmol/L (ref 22–32)
CO2: 22 mmol/L (ref 22–32)
Calcium: 8.7 mg/dL — ABNORMAL LOW (ref 8.9–10.3)
Calcium: 8.8 mg/dL — ABNORMAL LOW (ref 8.9–10.3)
Chloride: 103 mmol/L (ref 98–111)
Chloride: 104 mmol/L (ref 98–111)
Creatinine, Ser: 2.3 mg/dL — ABNORMAL HIGH (ref 0.61–1.24)
Creatinine, Ser: 2.19 mg/dL — ABNORMAL HIGH (ref 0.61–1.24)
GFR calc Af Amer: 36 mL/min — ABNORMAL LOW (ref >60)
GFR calc Af Amer: 38 mL/min — ABNORMAL LOW (ref >60)
GFR calc non Af Amer: 31 mL/min — ABNORMAL LOW (ref >60)
GFR calc non Af Amer: 33 mL/min — ABNORMAL LOW (ref >60)
Glucose, Bld: 135 mg/dL — ABNORMAL HIGH (ref 70–99)
Glucose, Bld: 132 mg/dL — ABNORMAL HIGH (ref 70–99)
Phosphorus: 4.3 mg/dL (ref 2.5–4.6)
Phosphorus: 4.5 mg/dL (ref 2.5–4.6)
Potassium: 4.7 mmol/L (ref 3.5–5.1)
Potassium: 4.9 mmol/L (ref 3.5–5.1)
Sodium: 138 mmol/L (ref 135–145)
Sodium: 138 mmol/L (ref 135–145)

## 2018-12-30 LAB — GLUCOSE, CAPILLARY
Glucose-Capillary: 105 mg/dL — ABNORMAL HIGH (ref 70–99)
Glucose-Capillary: 110 mg/dL — ABNORMAL HIGH (ref 70–99)
Glucose-Capillary: 111 mg/dL — ABNORMAL HIGH (ref 70–99)
Glucose-Capillary: 117 mg/dL — ABNORMAL HIGH (ref 70–99)
Glucose-Capillary: 124 mg/dL — ABNORMAL HIGH (ref 70–99)
Glucose-Capillary: 126 mg/dL — ABNORMAL HIGH (ref 70–99)

## 2018-12-30 LAB — CBC
HCT: 20.4 % — ABNORMAL LOW (ref 39.0–52.0)
Hemoglobin: 6.5 g/dL — CL (ref 13.0–17.0)
MCH: 30.7 pg (ref 26.0–34.0)
MCHC: 31.9 g/dL (ref 30.0–36.0)
MCV: 96.2 fL (ref 80.0–100.0)
Platelets: 151 10*3/uL (ref 150–400)
RBC: 2.12 MIL/uL — ABNORMAL LOW (ref 4.22–5.81)
RDW: 17.3 % — ABNORMAL HIGH (ref 11.5–15.5)
WBC: 45.2 10*3/uL — ABNORMAL HIGH (ref 4.0–10.5)
nRBC: 0.6 % — ABNORMAL HIGH (ref 0.0–0.2)

## 2018-12-30 LAB — COPPER, SERUM: Copper: 144 ug/dL (ref 72–166)

## 2018-12-30 LAB — LIPID PANEL
Cholesterol: 110 mg/dL (ref 0–200)
HDL: 19 mg/dL — ABNORMAL LOW (ref >40)
LDL Cholesterol: 76 mg/dL (ref 0–99)
Total CHOL/HDL Ratio: 5.8 RATIO
Triglycerides: 73 mg/dL (ref <150)
VLDL: 15 mg/dL (ref 0–40)

## 2018-12-30 LAB — PREPARE RBC (CROSSMATCH)

## 2018-12-30 LAB — POCT ACTIVATED CLOTTING TIME
Activated Clotting Time: 186 seconds
Activated Clotting Time: 191 seconds
Activated Clotting Time: 197 seconds
Activated Clotting Time: 197 seconds
Activated Clotting Time: 208 seconds
Activated Clotting Time: 213 seconds

## 2018-12-30 LAB — APTT: aPTT: 107 seconds — ABNORMAL HIGH (ref 24–36)

## 2018-12-30 LAB — MAGNESIUM: Magnesium: 2.5 mg/dL — ABNORMAL HIGH (ref 1.7–2.4)

## 2018-12-30 LAB — ZINC: Zinc: 70 ug/dL (ref 56–134)

## 2018-12-30 MED ORDER — SODIUM CHLORIDE 0.9% IV SOLUTION: Freq: Once | INTRAVENOUS | Status: DC

## 2018-12-30 NOTE — Progress Notes (Signed)
[place] Progress Note Patient Name: Thomas Adams DOB: [birth year] MRN: [id number]   Date of Service  12/30/2018  HPI/Events of Note  Anemia - Hgb = 6.5  eICU Interventions  Will transfuse 1 unit PRBC.     Intervention Category Major Interventions: Other:  [name],Steven Cornelia Copa 12/30/2018, 4:16 AM

## 2018-12-30 NOTE — Progress Notes (Signed)
CDS called w/ referral. Donor [phone]. Pt [place] 12. On vent at this time. DNR.Palliative care discussions. CDS rep will call unit.

## 2018-12-30 NOTE — Progress Notes (Signed)
  He is critically ill More hypoxic requiring PEEP of 10/60%. Intermittent agitation in spite of Precedex maximum dose and fentanyl 275 mcg Remains on CRRT , Levophed doses are increasing   Exam-currently sedated, bilateral scattered crackles, JVD, CRRT ongoing, soft nontender abdomen, 1+ edema.  Chest x-ray 5/20 personally reviewed which shows bilateral infiltrates, would be consistent with edema or septic emboli Labs reviewed  Impression/plan Septic shock due to MSSA endocarditis with likely septic emboli to lung and brain-continue nafcillin. Levophed being titrated for MEP 65  Embolic CVA-likely due to endocarditis or atrial fibrillation, unsuccessful thrombectomy-continue aspirin, not on anticoagulation yet  Acute metabolic encephalopathy-due to above, continue Precedex and fentanyl, goal RA SS -2 while on PEEP  Acute respiratory failure-continue PEEP of 10, dial down FiO2 if tolerated Anemia of critical illness-no evidence of acute blood loss ESRD-with hypotension, hence  CRRT  Appreciate CT surgery input and palliative care. Based on discussion with mother, his poor prognosis and known values, goal is to take a palliative approach and plan for withdrawal of life support 5/21.  We will allow family to visit given this palliative plan  The patient is critically ill with multiple organ systems failure and requires high complexity decision making for assessment and support, frequent evaluation and titration of therapies, application of advanced monitoring technologies and extensive interpretation of multiple databases. Critical Care Time devoted to patient care services described in this note independent of APP/resident  time is 35 minutes.   Kara Mead MD. Shade Flood. [facility] Pager [phone] If no response call [phone]   12/30/2018

## 2018-12-30 NOTE — Progress Notes (Signed)
Subjective:  No CRRT issues.  Family has decided for withdrawal of care tomorrow afternoon.   Objective Vital signs in last 24 hours: Vitals:   12/30/18 1009 12/30/18 1200 12/30/18 1300 12/30/18 1400  BP: 110/72 (!) 121/92 (!) 84/58 111/75  Pulse:  (!) 113    Resp: 13 (!) _0 Temp:  98.4 F (36.9 C)    TempSrc:  Axillary    SpO2:  95%    Weight:      Height:       Weight change:   Intake/Output Summary (Last 24 hours) at 12/30/2018 1439 Last data filed at 12/30/2018 1410 Gross per 24 hour  Intake 5291.1 ml  Output 4793 ml  Net 498.1 ml   Thomas Adams  4h  450  88kg  2/2 bath  Hep yes   L AVF parsabiv 5, calcitriol 1.5 Last hgb 10.9- calc 9.0- phos 6.9- pth 594   Summary: Thomas Adams is a 57 y.o. yo male who was admitted on 12/22/2018 with an acute ischemic stroke , then found to have an MSSA bacteremia    Assessment/Plan: 1. Acute CVA- c/w  septic emboli 2. MSSA bacteremia- IV abx per ID 3. ESRD- CRRT due to sepsis / shock 4. Secondary hyperparathyroidism- calcium is OK - Phos is OK - no binder  5. HTN/volume- +8 kg from dry wt 6. Anemia- hgb trending down - also platelets down- now on some heparin thru CRRT - needing to prevent clotting- on Aranesp.  Given one unit on 5/15 as well.   7. VDRF- diffuse bilat infiltrates on CXR, unable to pull fluid 8. EOL - plan now is for withdrawal of care tomorrow  Thomas Adams 12/30/2018, 2:39 PM    Labs: Basic Metabolic Panel: Recent Labs  Lab 12/29/18 0500 12/29/18 1329 12/30/18 0319  NA 138 139 138  K 4.4 5.0 4.7  CL 102 105 103  CO2 _1 GLUCOSE 142* 122* 135*  BUN 43* 41* 43*  CREATININE 2.15* 2.22* 2.30*  CALCIUM 8.8* 9.0 8.7*  PHOS 3.6 3.7 4.3   Liver Function Tests: Recent Labs  Lab 12/29/18 0500 12/29/18 1329 12/30/18 0319  AST 31  --   --   ALT 9  --   --   ALKPHOS 114  --   --   BILITOT 2.3*  --   --   PROT 6.7  --   --   ALBUMIN 1.7*  1.6* 1.6* 1.6*   No results for  input(s): LIPASE, AMYLASE in the last 168 hours. No results for input(s): AMMONIA in the last 168 hours. CBC: Recent Labs  Lab 12/27/18 1108  12/28/18 0616 12/29/18 0500 12/29/18 1329 12/30/18 0319  WBC 29.9*  --  48.5* 46.1* 45.8* 45.2*  HGB 6.8*   < > 8.1* 7.1* 7.0* 6.5*  HCT 20.0*   < > 24.3* 21.9* 21.6* 20.4*  MCV 89.7  --  92.4 95.6 95.6 96.2  PLT 135*  --  144* 147* 147* 151   < > = values in this interval not displayed.   Cardiac Enzymes: No results for input(s): CKTOTAL, CKMB, CKMBINDEX, TROPONINI in the last 168 hours. CBG: Recent Labs  Lab 12/29/18 1507 12/29/18 1930 12/29/18 2331 12/30/18 0338 12/30/18 0753  GLUCAP 94 116* 118* 110* 117*    Iron Studies: No results for input(s): IRON, TIBC, TRANSFERRIN, FERRITIN in the last 72 hours. Studies/Results: Dg Chest Port 1 View  Result Date: 12/30/2018 CLINICAL DATA:  Endotracheal  tube EXAM: PORTABLE CHEST 1 VIEW COMPARISON:  Yesterday FINDINGS: Endotracheal tube tip between the clavicular heads and carina. The orogastric tube tip is at the proximal stomach. Bilateral central line in stable good position. Extensive bilateral airspace disease without improvement. Cardiomegaly. IMPRESSION: Stable hardware positioning and symmetric, extensive airspace disease. Electronically Signed   By: Monte Fantasia M.D.   On: 12/30/2018 10:14   Dg Chest Port 1 View  Result Date: 12/29/2018 CLINICAL DATA:  Tachypnea. EXAM: PORTABLE CHEST 1 VIEW COMPARISON:  12/28/2018 FINDINGS: The right-sided central venous catheter, left-sided central venous catheter, endotracheal tube, and enteric tube all appear similar. Diffuse hazy bilateral airspace opacities are again noted. The cardiac silhouette is increased in size but stable from prior study. No pneumothorax. Trace bilateral pleural effusions are suspected, however the left costophrenic angle is not fully visualized. IMPRESSION: Stable lines and tubes.  No significant interval change.  Electronically Signed   By: Constance Holster M.D.   On: 12/29/2018 13:59   Medications: Infusions: .  prismasol BGK 4/2.5 300 mL/hr at 12/30/18 0214  .  prismasol BGK 4/2.5 300 mL/hr at 12/30/18 0229  . sodium chloride 10 mL/hr at 12/30/18 1400  . dexmedetomidine (PRECEDEX) IV infusion 1.5 mcg/kg/hr (12/30/18 1400)  . feeding supplement (VITAL AF 1.2 CAL) 1,000 mL (12/30/18 0229)  . fentaNYL infusion INTRAVENOUS 275 mcg/hr (12/30/18 1435)  . heparin 10,000 units/ 20 mL infusion syringe 1,800 Units/hr (12/30/18 1400)  . nafcillin IV Stopped (12/30/18 1344)  . norepinephrine (LEVOPHED) Adult infusion 10 mcg/min (12/30/18 1400)  . prismasol BGK 4/2.5 2,000 mL/hr at 12/30/18 1311    Scheduled Medications: . sodium chloride   Intravenous Once  . amiodarone  200 mg Per Tube BID  . chlorhexidine gluconate (MEDLINE KIT)  15 mL Mouth Rinse BID  . Chlorhexidine Gluconate Cloth  6 each Topical Daily  . darbepoetin (ARANESP) injection - DIALYSIS  150 mcg Intravenous Q Thu-HD  . feeding supplement (PRO-STAT SUGAR FREE 64)  60 mL Per Tube QID  . mouth rinse  15 mL Mouth Rinse 10 times per day  . pantoprazole sodium  40 mg Per Tube Q24H  . QUEtiapine  50 mg Per Tube BID  . sodium chloride flush  10-40 mL Intracatheter Q12H    have reviewed scheduled and prn medications.  Physical Exam: General: sedated on vent  Heart: tachy Lungs: mostly clear Abdomen: soft, non tender Extremities: really no edema Dialysis Access: left AVF patent- also with right IJ temp HD cath for CRRT  - placed 5/12-    12/30/2018,2:39 PM  LOS: 10 days

## 2018-12-30 NOTE — Evaluation (Signed)
SLP Cancellation Note  Patient Details Name: Mantaj Chamberlin MRN: [id number] DOB: 1961/05/20   Cancelled treatment:       Reason Eval/Treat Not Completed: Other (comment)(slp to s/o, pt for one way wean per notes, comfort care)   Macario Golds 12/30/2018, 7:27 AM   Luanna Salk, MS [facility] SLP [place] Pager [phone] Office [phone]

## 2018-12-30 NOTE — Progress Notes (Signed)
Daily Progress Note   Patient Name: Thomas Adams       Date: 12/30/2018 DOB: [birth year]  Age: 58 y.o. MRN#: [id number] Attending Physician: Rosalin Hawking, MD Primary Care Physician: Helen Hashimoto., MD Admit Date: 01/05/2019  Reason for Consultation/Follow-up: Establishing goals of care, Psychosocial/spiritual support and Withdrawal of life-sustaining treatment  Subjective: Per RN report, patient having episodes of bradycardia.   CCM has spoken with family this AM and have decided to pursue one way extubation/comfort care tomorrow.   Length of Stay: 10  Current Medications: Scheduled Meds:  . sodium chloride   Intravenous Once  . amiodarone  200 mg Per Tube BID  . chlorhexidine gluconate (MEDLINE KIT)  15 mL Mouth Rinse BID  . Chlorhexidine Gluconate Cloth  6 each Topical Daily  . darbepoetin (ARANESP) injection - DIALYSIS  150 mcg Intravenous Q Thu-HD  . feeding supplement (PRO-STAT SUGAR FREE 64)  60 mL Per Tube QID  . mouth rinse  15 mL Mouth Rinse 10 times per day  . pantoprazole sodium  40 mg Per Tube Q24H  . QUEtiapine  50 mg Per Tube BID  . sodium chloride flush  10-40 mL Intracatheter Q12H    Continuous Infusions: .  prismasol BGK 4/2.5 300 mL/hr at 12/30/18 0214  .  prismasol BGK 4/2.5 300 mL/hr at 12/30/18 0229  . sodium chloride Stopped (12/30/18 1408)  . dexmedetomidine (PRECEDEX) IV infusion 1.4 mcg/kg/hr (12/30/18 1600)  . feeding supplement (VITAL AF 1.2 CAL) 1,000 mL (12/30/18 0229)  . fentaNYL infusion INTRAVENOUS 250 mcg/hr (12/30/18 1600)  . heparin 10,000 units/ 20 mL infusion syringe 1,800 Units/hr (12/30/18 1600)  . nafcillin IV Stopped (12/30/18 1344)  . norepinephrine (LEVOPHED) Adult infusion 8 mcg/min (12/30/18 1600)  . prismasol BGK 4/2.5 2,000 mL/hr at  12/30/18 1540    PRN Meds: sodium chloride, acetaminophen **OR** acetaminophen (TYLENOL) oral liquid 160 mg/5 mL **OR** acetaminophen, albuterol, fentaNYL (SUBLIMAZE) injection, heparin, heparin, heparin, ipratropium, [COMPLETED] loperamide HCl **FOLLOWED BY** loperamide HCl, LORazepam, metoprolol tartrate, sodium chloride, sodium chloride flush      Vital Signs: BP (!) 77/56   Pulse (!) 125   Temp 98.4 F (36.9 C) (Axillary)   Resp 18   Ht '5\' 9"'  (1.753 m)   Wt 96 kg   SpO2 100%   BMI 31.25 kg/m  SpO2: SpO2: 100 % O2 Device: O2 Device: Ventilator O2 Flow Rate: O2 Flow Rate (L/min): 15 L/min  Intake/output summary:   Intake/Output Summary (Last 24 hours) at 12/30/2018 1617 Last data filed at 12/30/2018 1600 Gross per 24 hour  Intake 5201.4 ml  Output 4903 ml  Net 298.4 ml   LBM: Last BM Date: 12/29/18 Baseline Weight: Weight: 87.2 kg Most recent weight: Weight: 96 kg       Palliative Assessment/Data: UTA    Flowsheet Rows     Most Recent Value  Intake Tab  Referral Department  Neurology  Unit at Time of Referral  ICU  Palliative Care Primary Diagnosis  Sepsis/Infectious Disease  Date Notified  12/29/18  Palliative Care Type  New Palliative care  Reason for referral  Clarify Goals of Care  Date of Admission  12/17/2018  Date first seen by Palliative Care  12/29/18  # of days Palliative referral response time  0 Day(s)  # of days IP prior to Palliative referral  9  Clinical Assessment  Palliative Performance Scale Score  30%  Psychosocial & Spiritual Assessment  Palliative Care Outcomes  Patient/Family meeting held?  Yes  Who was at the meeting?  mother  Palliative Care Outcomes  Clarified goals of care, Provided end of life care assistance, Provided psychosocial or spiritual support, Provided advance care planning, Changed CPR status  Patient/Family wishes: Interventions discontinued/not started   Lurline Idol, PEG      Patient Active Problem List   Diagnosis Date  Noted  . Acute embolic stroke, bilateral hemispheres 12/29/2018  . Fever   . Goals of care, counseling/discussion   . Palliative care by specialist   . Paroxysmal atrial fibrillation ([place])   . Acute respiratory failure with hypoxia ([place])   . Septic shock ([place])   . 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 MSSA bacteremia 12/27/2018  . Bacterial endocarditis, presumed 12/19/2018  . Encounter for central line placement 08/18/2018  . Atrial fibrillation with RVR ([place]) 03/04/2016  . Essential hypertension 08/09/2014  . End stage renal disease ([place]) 11/04/2013  . CKD (chronic kidney disease) stage V requiring chronic dialysis ([place]) 09/23/2013  . Malignant hypertension 09/18/2013  . Anemia     Palliative Care Assessment & Plan   HPI: 58 y.o. male  with past medical history of a fib not on anticoagulation, HTN, and ESRD on HD admitted on 12/26/2018 with left facial droop and left sided weakness. Found to have R MCA branch occlusion not amenable to thrombectomy and not a tPA candidate d/t unknown time. Also found to be febrile with elevated white count. Strokes found to be d/t left sided endocarditis. Patient is not a surgical candidate d/t condition and poor prognosis. Patient has been vent dependent since 5/19 and requiring pressors off and on. PMT consulted for [place].   Assessment: Follow up with patient's mother today.   We reviewed conversation from yesterday. Yesterday, she made clear patient would never want trach/peg or facility placement. After her conversation with critical care this morning she has decided to pursue comfort care tomorrow. She plans to be at the hospital between 1 and 2 pm.   She shared some fears and concerns about her decision - we discussed this thoroughly - encouraged her that she was honoring the patient's previously stated wishes. Emotional support provided.   Recommendations/Plan:  Per CCM, plan to proceed with comfort care and extubation 5/21 -  mother plans to be at the bedside  Emotional support provided to patient's mother  Will consult spiritual care for emotional support tomorrow  DNR decided 5/19  Goals of Care and Additional Recommendations:  Limitations on Scope of Treatment: No Tracheostomy  Code Status:  DNR  Prognosis:   Hours - Days  Discharge Planning:  Anticipated Hospital Death  Care plan was discussed with mother and RN  Thank you for allowing the Palliative Medicine Team to assist in the care of this patient.   The above conversation was completed via telephone due to the visitor restrictions during the COVID-19 pandemic. Thorough chart review and discussion with necessary members of the care team was completed as part of assessment. All issues were discussed and addressed but no physical exam was performed.   Total Time 35 minutes Prolonged Time Billed  no       Greater than 50%  of this time was spent counseling and coordinating care  related to the above assessment and plan.  Juel Burrow, DNP, [facility] Palliative Medicine Team Team Phone # [phone]  Pager [phone]

## 2018-12-30 NOTE — Progress Notes (Signed)
ID Progress Note:   Thomas Adams is a 58 y.o. male with MSSA bacteremia secondary to left sided endocarditis complicated by bilateral CNS emboli now with ongoing critical illness, multi-system organ failure with increasing hypoxia/hypotension requiring support.   Chart reviewed and noted decision to withdraw life support on 5/21 which is appropriate given his current worsening condition and goals of care as discussed with his mother.  Would continue his nafcillin until his family is ready to withdraw care.  If we can be of any further assistance please do not hesitate to call.   Janene Madeira, MSN, NP-C [facility] [email or web address] Pager: [phone] Office: [phone] [place]: [phone]

## 2018-12-30 NOTE — Progress Notes (Signed)
STROKE TEAM PROGRESS NOTE   INTERVAL HISTORY Pt unresponsive, still on vent, sedation and CCRT. Leukocytosis again slightly better but still at 40s, palliative care on board, discussed with mom, given poor prognosis and needing trach and PEG, mom planned for comfort care tomorrow.  Vitals:   12/30/18 1710 12/30/18 1720 12/30/18 1730 12/30/18 1747  BP: (!) 93/59 95/68 90/62  122/76  Pulse:    (!) 110  Resp: 12 11 15 16   Temp:      TempSrc:      SpO2:    100%  Weight:      Height:        CBC:  Recent Labs  Lab 12/29/18 1329 12/30/18 0319  WBC 45.8* 45.2*  HGB 7.0* 6.5*  HCT 21.6* 20.4*  MCV 95.6 96.2  PLT 147* 960    Basic Metabolic Panel:  Recent Labs  Lab 12/29/18 0500  12/30/18 0319 12/30/18 1524  NA 138   < > 138 138  K 4.4   < > 4.7 4.9  CL 102   < > 103 104  CO2 25   < > 22 22  GLUCOSE 142*   < > 135* 132*  BUN 43*   < > 43* 44*  CREATININE 2.15*   < > 2.30* 2.19*  CALCIUM 8.8*   < > 8.7* 8.8*  MG 2.7*  --  2.5*  --   PHOS 3.6   < > 4.3 4.5   < > = values in this interval not displayed.   Ct Angio Head W Or Wo Contrast  Result Date: 12/12/2018 CLINICAL DATA:  Left-sided weakness, facial droop, and slurred speech. EXAM: CT ANGIOGRAPHY HEAD AND NECK CT PERFUSION BRAIN TECHNIQUE: Multidetector CT imaging of the head and neck was performed using the standard protocol during bolus administration of intravenous contrast. Multiplanar CT image reconstructions and MIPs were obtained to evaluate the vascular anatomy. Carotid stenosis measurements (when applicable) are obtained utilizing NASCET criteria, using the distal internal carotid diameter as the denominator. Multiphase CT imaging of the brain was performed following IV bolus contrast injection. Subsequent parametric perfusion maps were calculated using RAPID software. CONTRAST:  169mL OMNIPAQUE IOHEXOL 350 MG/ML SOLN COMPARISON:  None. FINDINGS: CTA NECK FINDINGS The study is motion degraded including moderate to  severe motion artifact through the larynx which limits assessment of the carotid bifurcations. Aortic arch: Standard 3 vessel aortic arch with mild atherosclerotic plaque. No significant arch vessel origin stenosis. Right carotid system: Patent with moderate calcified atherosclerosis about the carotid bifurcation. No evidence of dissection or significant stenosis within limitations of motion artifact. Left carotid system: Patent with mild calcified atherosclerosis about the carotid bifurcation without evidence of significant stenosis or dissection. Vertebral arteries: Patent with the left being mildly dominant. Calcified atherosclerosis at the vertebral artery origins results in moderate to severe stenosis bilaterally. Skeleton: Mild cervical disc degeneration. 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 Upper chest: Clear lung apices. Review of the MIP images confirms the above findings CTA HEAD FINDINGS Anterior circulation: The internal carotid arteries are patent from skull base to carotid termini with calcified atherosclerosis resulting in mild cavernous stenosis on the right. The right M1 segment is widely patent. Branch vessel assessment is limited by motion artifact, however there is a suspected mid right M2 branch vessel occlusion. The left MCA appears duplicated with suggestion of severe stenosis of the more superior origin although motion artifact may be contributing to this appearance. ACAs are patent without  evidence of flow limiting proximal stenosis. No aneurysm is identified. Posterior circulation: The intracranial vertebral arteries are patent to the basilar without evidence of significant stenosis. P ICAs and a ICAs are not well evaluated. SCA is are patent. The basilar artery is widely patent. There is a small right posterior communicating artery. PCAs are patent without evidence of flow limiting proximal stenosis allowing for suspected artifactual narrowing in the  proximal right P2 segment. No aneurysm is identified. Venous sinuses: Patent. Anatomic variants: None. Review of the MIP images confirms the above findings CT Brain Perfusion Findings: ASPECTS: 10 CBF (<30%) Volume: 2mL Perfusion (Tmax>6.0s) volume: 43mL Mismatch Volume: 87mL Infarction Location: No core infarct by standard RAPID criteria. Penumbra anterolaterally in the right parietal lobe. IMPRESSION: 1. Motion degraded examination with limited assessment of the small and medium-sized intracranial arteries. 2. Suspected mid right M2 branch vessel occlusion with 9 mL penumbra in the right parietal lobe and no core infarct based on CTP. 3. No more proximal large vessel occlusion. 4. Patent cervical carotid arteries without evidence of significant stenosis. 5. 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 6. Possible severe left MCA origin stenosis. 7.  Aortic Atherosclerosis (ICD10-I70.0). These results were called by telephone at the time of interpretation on 01/04/2019 at 12:29 pm to Dr. Kerney Elbe , who verbally acknowledged these results. Electronically Signed   By: Logan Bores M.D.   On: 01/09/2019 12:59   Ct Angio Neck W Or Wo Contrast  Result Date: 12/14/2018 CLINICAL DATA:  Left-sided weakness, facial droop, and slurred speech. EXAM: CT ANGIOGRAPHY HEAD AND NECK CT PERFUSION BRAIN TECHNIQUE: Multidetector CT imaging of the head and neck was performed using the standard protocol during bolus administration of intravenous contrast. Multiplanar CT image reconstructions and MIPs were obtained to evaluate the vascular anatomy. Carotid stenosis measurements (when applicable) are obtained utilizing NASCET criteria, using the distal internal carotid diameter as the denominator. Multiphase CT imaging of the brain was performed following IV bolus contrast injection. Subsequent parametric perfusion maps were calculated using RAPID software. CONTRAST:  177mL OMNIPAQUE IOHEXOL 350 MG/ML  SOLN COMPARISON:  None. FINDINGS: CTA NECK FINDINGS The study is motion degraded including moderate to severe motion artifact through the larynx which limits assessment of the carotid bifurcations. Aortic arch: Standard 3 vessel aortic arch with mild atherosclerotic plaque. No significant arch vessel origin stenosis. Right carotid system: Patent with moderate calcified atherosclerosis about the carotid bifurcation. No evidence of dissection or significant stenosis within limitations of motion artifact. Left carotid system: Patent with mild calcified atherosclerosis about the carotid bifurcation without evidence of significant stenosis or dissection. Vertebral arteries: Patent with the left being mildly dominant. Calcified atherosclerosis at the vertebral artery origins results in moderate to severe stenosis bilaterally. Skeleton: Mild cervical disc degeneration. 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 Upper chest: Clear lung apices. Review of the MIP images confirms the above findings CTA HEAD FINDINGS Anterior circulation: The internal carotid arteries are patent from skull base to carotid termini with calcified atherosclerosis resulting in mild cavernous stenosis on the right. The right M1 segment is widely patent. Branch vessel assessment is limited by motion artifact, however there is a suspected mid right M2 branch vessel occlusion. The left MCA appears duplicated with suggestion of severe stenosis of the more superior origin although motion artifact may be contributing to this appearance. ACAs are patent without evidence of flow limiting proximal stenosis. No aneurysm is identified. Posterior circulation: The intracranial  vertebral arteries are patent to the basilar without evidence of significant stenosis. P ICAs and a ICAs are not well evaluated. SCA is are patent. The basilar artery is widely patent. There is a small right posterior communicating artery. PCAs are patent  without evidence of flow limiting proximal stenosis allowing for suspected artifactual narrowing in the proximal right P2 segment. No aneurysm is identified. Venous sinuses: Patent. Anatomic variants: None. Review of the MIP images confirms the above findings CT Brain Perfusion Findings: ASPECTS: 10 CBF (<30%) Volume: 70mL Perfusion (Tmax>6.0s) volume: 5mL Mismatch Volume: 68mL Infarction Location: No core infarct by standard RAPID criteria. Penumbra anterolaterally in the right parietal lobe. IMPRESSION: 1. Motion degraded examination with limited assessment of the small and medium-sized intracranial arteries. 2. Suspected mid right M2 branch vessel occlusion with 9 mL penumbra in the right parietal lobe and no core infarct based on CTP. 3. No more proximal large vessel occlusion. 4. Patent cervical carotid arteries without evidence of significant stenosis. 5. 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 6. Possible severe left MCA origin stenosis. 7.  Aortic Atherosclerosis (ICD10-I70.0). These results were called by telephone at the time of interpretation on 12/30/2018 at 12:29 pm to Dr. Kerney Elbe , who verbally acknowledged these results. Electronically Signed   By: Logan Bores M.D.   On: 12/27/2018 12:59   Mr Brain Wo Contrast  Result Date: 12/21/2018 CLINICAL DATA:  Stroke follow-up EXAM: MRI HEAD WITHOUT CONTRAST TECHNIQUE: Multiplanar, multiecho pulse sequences of the brain and surrounding structures were obtained without intravenous contrast. COMPARISON:  CTA head neck 12/18/2018 FINDINGS: BRAIN: Multifocal abnormal diffusion restriction within both cerebellar hemispheres and scattered throughout multiple bilateral supratentorial territories. The largest area of ischemia is in the right frontal operculum and insula. The midline structures are normal. No midline shift or other mass effect. Early confluent hyperintense T2-weighted signal of the periventricular and deep  white matter, most commonly due to chronic ischemic microangiopathy. Generalized atrophy without lobar predilection. Multiple scattered foci of microhemorrhage, many of which correspond to areas of ischemia. No mass lesion. VASCULAR: Abnormal flow void within the right transverse sinus. The arterial flow voids are preserved. SKULL AND UPPER CERVICAL SPINE: Calvarial bone marrow signal is normal. There is no skull base mass. Visualized upper cervical spine and soft tissues are normal. SINUSES/ORBITS: Fluid level in the maxillary sinuses. The orbits are normal. IMPRESSION: 1. Multifocal acute ischemia scattered throughout both cerebral and cerebellar hemispheres, with the largest area located in the right frontal operculum and insula. 2. Multifocal petechiae/microhemorrhage at the sites of ischemic infarction. No intraparenchymal hematoma or mass effect. 3. Abnormal flow void in the right transverse sinus could indicate dural venous sinus thrombosis. However, on the earlier CTA and the interventional angiogram, the sinus was clearly patent. Electronically Signed   By: Ulyses Jarred M.D.   On: 12/21/2018 02:28   Ct Cerebral Perfusion W Contrast  Result Date: 12/29/2018 CLINICAL DATA:  Left-sided weakness, facial droop, and slurred speech. EXAM: CT ANGIOGRAPHY HEAD AND NECK CT PERFUSION BRAIN TECHNIQUE: Multidetector CT imaging of the head and neck was performed using the standard protocol during bolus administration of intravenous contrast. Multiplanar CT image reconstructions and MIPs were obtained to evaluate the vascular anatomy. Carotid stenosis measurements (when applicable) are obtained utilizing NASCET criteria, using the distal internal carotid diameter as the denominator. Multiphase CT imaging of the brain was performed following IV bolus contrast injection. Subsequent parametric perfusion maps were calculated using RAPID software. CONTRAST:  180mL OMNIPAQUE  IOHEXOL 350 MG/ML SOLN COMPARISON:  None.  FINDINGS: CTA NECK FINDINGS The study is motion degraded including moderate to severe motion artifact through the larynx which limits assessment of the carotid bifurcations. Aortic arch: Standard 3 vessel aortic arch with mild atherosclerotic plaque. No significant arch vessel origin stenosis. Right carotid system: Patent with moderate calcified atherosclerosis about the carotid bifurcation. No evidence of dissection or significant stenosis within limitations of motion artifact. Left carotid system: Patent with mild calcified atherosclerosis about the carotid bifurcation without evidence of significant stenosis or dissection. Vertebral arteries: Patent with the left being mildly dominant. Calcified atherosclerosis at the vertebral artery origins results in moderate to severe stenosis bilaterally. Skeleton: Mild cervical disc degeneration. 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 Upper chest: Clear lung apices. Review of the MIP images confirms the above findings CTA HEAD FINDINGS Anterior circulation: The internal carotid arteries are patent from skull base to carotid termini with calcified atherosclerosis resulting in mild cavernous stenosis on the right. The right M1 segment is widely patent. Branch vessel assessment is limited by motion artifact, however there is a suspected mid right M2 branch vessel occlusion. The left MCA appears duplicated with suggestion of severe stenosis of the more superior origin although motion artifact may be contributing to this appearance. ACAs are patent without evidence of flow limiting proximal stenosis. No aneurysm is identified. Posterior circulation: The intracranial vertebral arteries are patent to the basilar without evidence of significant stenosis. P ICAs and a ICAs are not well evaluated. SCA is are patent. The basilar artery is widely patent. There is a small right posterior communicating artery. PCAs are patent without evidence of flow  limiting proximal stenosis allowing for suspected artifactual narrowing in the proximal right P2 segment. No aneurysm is identified. Venous sinuses: Patent. Anatomic variants: None. Review of the MIP images confirms the above findings CT Brain Perfusion Findings: ASPECTS: 10 CBF (<30%) Volume: 31mL Perfusion (Tmax>6.0s) volume: 2mL Mismatch Volume: 77mL Infarction Location: No core infarct by standard RAPID criteria. Penumbra anterolaterally in the right parietal lobe. IMPRESSION: 1. Motion degraded examination with limited assessment of the small and medium-sized intracranial arteries. 2. Suspected mid right M2 branch vessel occlusion with 9 mL penumbra in the right parietal lobe and no core infarct based on CTP. 3. No more proximal large vessel occlusion. 4. Patent cervical carotid arteries without evidence of significant stenosis. 5. 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 6. Possible severe left MCA origin stenosis. 7.  Aortic Atherosclerosis (ICD10-I70.0). These results were called by telephone at the time of interpretation on 12/16/2018 at 12:29 pm to Dr. Kerney Elbe , who verbally acknowledged these results. Electronically Signed   By: Logan Bores M.D.   On: 12/12/2018 12:59   Dg Chest Port 1 View  Result Date: 12/30/2018 CLINICAL DATA:  Endotracheal tube EXAM: PORTABLE CHEST 1 VIEW COMPARISON:  Yesterday FINDINGS: Endotracheal tube tip between the clavicular heads and carina. The orogastric tube tip is at the proximal stomach. Bilateral central line in stable good position. Extensive bilateral airspace disease without improvement. Cardiomegaly. IMPRESSION: Stable hardware positioning and symmetric, extensive airspace disease. Electronically Signed   By: Monte Fantasia M.D.   On: 12/30/2018 10:14   Dg Chest Port 1 View  Result Date: 12/29/2018 CLINICAL DATA:  Tachypnea. EXAM: PORTABLE CHEST 1 VIEW COMPARISON:  12/28/2018 FINDINGS: The right-sided central venous  catheter, left-sided central venous catheter, endotracheal tube, and enteric tube all appear similar. Diffuse hazy bilateral  airspace opacities are again noted. The cardiac silhouette is increased in size but stable from prior study. No pneumothorax. Trace bilateral pleural effusions are suspected, however the left costophrenic angle is not fully visualized. IMPRESSION: Stable lines and tubes.  No significant interval change. Electronically Signed   By: Constance Holster M.D.   On: 12/29/2018 13:59   Dg Chest Port 1 View  Result Date: 12/28/2018 CLINICAL DATA:  Respiratory failure. EXAM: PORTABLE CHEST 1 VIEW COMPARISON:  12/27/2018 FINDINGS: Endotracheal tube terminates 4 cm above the carina. Bilateral jugular catheters are unchanged. An enteric tube courses into the upper abdomen. The cardiac silhouette is borderline enlarged. There is aortic atherosclerosis. Interstitial and patchy airspace opacities throughout both lungs have mildly to moderately increased in the interim. No sizable pleural effusion or pneumothorax is identified with note made incomplete imaging of the left costophrenic angle. IMPRESSION: Worsening bilateral airspace disease. Electronically Signed   By: Logan Bores M.D.   On: 12/28/2018 09:13   Dg Chest Port 1 View  Result Date: 12/27/2018 CLINICAL DATA:  Intubated EXAM: PORTABLE CHEST 1 VIEW COMPARISON:  One-view chest x-ray 12/26/2018 FINDINGS: Heart size is normal. Endotracheal tube terminates 5.5 cm above the carina, in satisfactory position. Left IJ line and right IJ sheath are stable. NG tube courses off the inferior border of the film. There is slight improved aeration. Patchy interstitial and airspace disease is again noted bilaterally. IMPRESSION: 1. Support apparatus stable. 2. Slight improved aeration of both lungs. 3. Persistent diffuse interstitial and airspace disease. Electronically Signed   By: San Morelle M.D.   On: 12/27/2018 08:30   Dg Chest Port 1  View  Result Date: 12/26/2018 CLINICAL DATA:  Encounter for central line placement. EXAM: PORTABLE CHEST 1 VIEW COMPARISON:  12/26/2018 FINDINGS: There is a right IJ catheter with tip in the projection of the SVC. Left IJ catheter has been recently placed and tip is projecting over the cavoatrial junction. ET tube tip is above the carina. There is an enteric tube with tip just below the GE junction. Normal heart size. Diffuse bilateral pulmonary opacities are again noted and appear unchanged from previous exam IMPRESSION: 1. Interval placement of a left IJ catheter with tip projecting over the cavoatrial junction. No pneumothorax identified. 2. No change in aeration to lungs compared with previous exam. Electronically Signed   By: Kerby Moors M.D.   On: 12/26/2018 18:05   Dg Chest Port 1 View  Result Date: 12/26/2018 CLINICAL DATA:  Respiratory failure EXAM: PORTABLE CHEST 1 VIEW COMPARISON:  12/25/2018 FINDINGS: Endotracheal tube terminates 2.5 cm above the carina. Right IJ venous catheter terminates in the mid SVC. Enteric tube terminates in the gastric cardia. Multifocal patchy opacities in the lungs bilaterally, left upper lobe predominant, progressive from the prior and suspicious for multifocal pneumonia. No definite pleural effusions. No pneumothorax. The heart is top-normal in size. IMPRESSION: Endotracheal tube terminates 2.5 cm above the carina. Multifocal pneumonia, mildly progressive. Electronically Signed   By: Julian Hy M.D.   On: 12/26/2018 06:54   Dg Chest Port 1 View  Result Date: 12/25/2018 CLINICAL DATA:  58 year old male with cerebral infarcts, sepsis, bacteremia. EXAM: PORTABLE CHEST 1 VIEW COMPARISON:  12/24/2018 and earlier. FINDINGS: Portable AP semi upright view at 0644 hours. Stable endotracheal tube and right IJ central line. Enteric tube tip at the level of the gastric body, the side hole is not clearly identified. Improved ventilation since yesterday with decreasing  indistinct perihilar opacity. Mildly larger lung volumes. Stable  cardiac size and mediastinal contours. No pneumothorax, pleural effusion or areas of worsening ventilation. Stable visible bowel gas pattern. IMPRESSION: 1. Stable lines and tubes. 2. Improved ventilation since yesterday with decreasing indistinct perihilar opacity. 3. No new cardiopulmonary abnormality. Electronically Signed   By: Genevie Ann M.D.   On: 12/25/2018 09:21   Dg Chest Port 1 View  Result Date: 12/24/2018 CLINICAL DATA:  Acute respiratory failure. EXAM: PORTABLE CHEST 1 VIEW COMPARISON:  Yesterday. FINDINGS: Endotracheal tube, enteric tube, and right central line remain in place. Persistent low lung volumes. Ill-defined bilateral lung opacities, left greater than right, with slight progression from prior. Worsening retrocardiac and right basilar opacity with more confluent density. No large pleural effusion. No pneumothorax. IMPRESSION: Worsening bilateral perihilar lung opacities, left greater than right. This may be pulmonary edema, ARDS, or multifocal pneumonia. More confluent opacities at the bases is also progressed. Electronically Signed   By: Keith Rake M.D.   On: 12/24/2018 03:58   Dg Chest Port 1 View  Result Date: 12/23/2018 CLINICAL DATA:  58 year old male with cerebral infarcts, sepsis, bacteremia, respiratory failure. EXAM: PORTABLE CHEST 1 VIEW COMPARISON:  12/22/2018 and earlier. FINDINGS: Portable AP semi upright view at 0649 hours. Stable endotracheal tube tip between the level the clavicles and carina. Enteric tube courses to the abdomen, tip not included. Stable right IJ central line. Mildly larger lung volumes and regressed retrocardiac opacity with air bronchograms. The left hemidiaphragm is visible today. Mildly asymmetric increased left lung interstitial opacity is stable. No pneumothorax or new pulmonary opacity. Normal cardiac size and mediastinal contours. Negative visible bowel gas pattern. IMPRESSION:  1. Stable lines and tubes. 2. Mildly larger lung volumes and regressed left lower lobe collapse or consolidation. 3. Asymmetric interstitial opacity in the left lung persists, favor infection over asymmetric edema. 4. No new cardiopulmonary abnormality. Electronically Signed   By: Genevie Ann M.D.   On: 12/23/2018 08:38   Dg Chest Port 1 View  Result Date: 12/22/2018 CLINICAL DATA:  58 year old male with cerebral infarcts, sepsis, staph bacteremia, respiratory failure. EXAM: PORTABLE CHEST 1 VIEW COMPARISON:  12/21/2018 and earlier. FINDINGS: Portable AP semi upright view at 0528 hours. ET tube in good position between the clavicles and carina. Enteric tube courses to the abdomen, side hole is at the level of the GE junction. Stable right IJ central line. Left lower lobe collapse or consolidation with some air bronchograms. Additional patchy left perihilar and medial right lung base opacity. No pneumothorax or pulmonary edema. No definite pleural effusion. No acute osseous abnormality identified. IMPRESSION: 1. Enteric tube side hole at the level of the GE junction, advance 5 cm to ensure side hole placement within the stomach. 2.  Otherwise stable lines and tubes. 3. Stable ventilation with left lower lobe collapse or consolidation and patchy additional perihilar opacity. Electronically Signed   By: Genevie Ann M.D.   On: 12/22/2018 07:23   Dg Chest Port 1 View  Result Date: 12/21/2018 CLINICAL DATA:  Central line placement. EXAM: PORTABLE CHEST 1 VIEW COMPARISON:  12/22/2018 FINDINGS: Endotracheal tube with tip 3.1 cm above the carina. Interval advancement of the nasogastric tube which has tip just below the expected region of the gastroesophageal junction as this could be advanced another 5 cm. Right IJ central venous catheter has tip over the SVC. Lungs are adequately inflated demonstrate hazy opacification over the left lung which may be due to layering effusion with atelectasis. Subtle hazy opacification of  the perihilar regions which may be due  to mild interstitial edema. Cardiomediastinal silhouette and remainder of the exam is unchanged. IMPRESSION: Hazy opacification over the left lung which may be due to layering effusion with atelectasis. Mild hazy opacification of the perihilar regions which suggest mild interstitial edema. Tubes and lines as described. Electronically Signed   By: Marin Olp M.D.   On: 12/21/2018 11:39   Dg Chest Portable 1 View  Result Date: 01/07/2019 CLINICAL DATA:  Acute LEFT CVA. EXAM: PORTABLE CHEST 1 VIEW COMPARISON:  03/04/2016 radiographs FINDINGS: An endotracheal tube with tip 4 cm above the carina and OG tube with tip overlying the distal esophagus noted. UPPER limits normal heart size noted. There is no evidence of focal airspace disease, pulmonary edema, suspicious pulmonary nodule/mass, pleural effusion, or pneumothorax. No acute bony abnormalities are identified. IMPRESSION: Endotracheal tube with tip 4 cm above the carina. OG tube with tip overlying the distal esophagus-recommend advancement. No acute cardiopulmonary disease. Electronically Signed   By: Margarette Canada M.D.   On: 12/17/2018 14:45   Dg Abd Portable 1 View  Result Date: 01/05/2019 CLINICAL DATA:  OG tube placement. EXAM: PORTABLE ABDOMEN - 1 VIEW COMPARISON:  None. FINDINGS: An OG tube is identified with tip overlying the distal esophagus. Recommend advancement. The bowel gas pattern is unremarkable. IMPRESSION: OG tube with tip overlying the distal esophagus-recommend advancement. Electronically Signed   By: Margarette Canada M.D.   On: 12/23/2018 14:46   Ct Head Code Stroke Wo Contrast  Result Date: 12/26/2018 CLINICAL DATA:  Code stroke. Left-sided weakness, facial droop, and slurred speech. EXAM: CT HEAD WITHOUT CONTRAST TECHNIQUE: Contiguous axial images were obtained from the base of the skull through the vertex without intravenous contrast. COMPARISON:  Head CT 09/17/2013 FINDINGS: Brain: There is no  evidence of acute infarct, intracranial hemorrhage, mass, midline shift, or extra-axial fluid collection. Patchy cerebral white matter hypodensities are similar to the prior CT and nonspecific but compatible with moderately age advanced chronic small vessel ischemic disease. Mild prominence of the lateral ventricles is also unchanged and may reflect central predominant cerebral atrophy. Vascular: Calcified atherosclerosis at the skull base. No hyperdense vessel. Skull: No fracture or focal osseous lesion. Sinuses/Orbits: Mild mucosal thickening in the maxillary sinuses. Clear mastoid air cells. Left cataract extraction. Other: None. ASPECTS [facility] Stroke Program Early CT Score) - Ganglionic level infarction (caudate, lentiform nuclei, internal capsule, insula, M1-M3 cortex): 7 - Supraganglionic infarction (M4-M6 cortex): 3 Total score (0-10 with 10 being normal): 10 IMPRESSION: 1. No evidence of acute intracranial abnormality. 2. ASPECTS is 10. 3. Moderate chronic small vessel ischemic disease. These results were communicated to Dr. Cheral Marker at 12:09 pm on 12/23/2018 by text page via the [facility] messaging system. Electronically Signed   By: Logan Bores M.D.   On: 12/22/2018 12:37   Ir Angio Intra Extracran Sel Com Carotid Innominate Bilat Mod Sed  Result Date: 12/24/2018 CLINICAL DATA:  Left-sided facial droop, severe dysarthria, and left upper weakness. CT angiogram revealing occlusion of the right middle cerebral artery questionable M2 division. EXAM: IR ANGIO VERTEBRAL SEL SUBCLAVIAN INNOMINATE UNI RIGHT MOD SED; IR ANGIO VERTEBRAL SEL VERTEBRAL UNI LEFT MOD SED; BILATERAL COMMON CAROTID AND INNOMINATE ANGIOGRAPHY COMPARISON:  CT angiogram of the head and neck of 12/18/2018. MEDICATIONS: Heparin none units IV; Ancef 2 g IV antibiotic was administered within 1 hour of the procedure. ANESTHESIA/SEDATION: General anesthesia. CONTRAST:  Isovue 300 approximately 50 mL. FLUOROSCOPY TIME:  Fluoroscopy Time: 5 minutes  0 seconds (712 mGy). COMPLICATIONS: None immediate. TECHNIQUE: Informed written  consent was obtained from the patient's mother after a thorough discussion of the procedural risks, benefits and alternatives. All questions were addressed. Maximal Sterile Barrier Technique was utilized including caps, mask, sterile gowns, sterile gloves, sterile drape, hand hygiene and skin antiseptic. A timeout was performed prior to the initiation of the procedure. The right groin was prepped and draped in the usual sterile fashion. Thereafter using modified Seldinger technique, transfemoral access into the right common femoral artery was obtained without difficulty. Over a 0.035 inch guidewire, a 5 French Pinnacle sheath was inserted. Through this, and also over 0.035 inch guidewire, a 5 [place] JB 1 catheter was advanced to the aortic arch region and selectively positioned in the right common carotid artery, , the right subclavian artery, the left common carotid artery and the left vertebral artery. FINDINGS: The right subclavian arteriogram demonstrates hypoplastic right vertebral artery with a normal origin. The vessel is seen to opacify to the cranial skull base where it opacifies the right vertebrobasilar junction and the right posterior-inferior cerebellar artery. The opacified portion of the basilar artery is widely patent. The right common carotid arteriogram demonstrates the right external carotid artery and its major branches to be widely patent. The right internal carotid artery at the bulb to the cranial skull base demonstrates wide patency. The petrous, cavernous and the supraclinoid segments are widely patent. The right middle cerebral artery and the right anterior cerebral artery opacify into the capillary and venous phases. There is a tapered severe stenosis followed by occlusion of a mid perisylvian branch of the superior division of the right middle cerebral artery in the M3 region. Extensive collaterals are seen  retrogradely opacifying this distribution. Dural venous sinuses are widely patent. The left common carotid arteriogram demonstrates the left external carotid artery and its major branches to be widely patent. The left internal carotid artery at the bulb to the cranial skull base demonstrates wide patency. The petrous, cavernous and the supraclinoid segments are widely patent. The left middle cerebral artery demonstrates an early bifurcation a developmental variation, the left anterior cerebral artery opacifies with the left middle cerebral artery into the capillary and the venous phases. The left vertebral artery origin is widely patent. The vessel opacifies to the cranial skull base. Wide patency is seen of the left vertebrobasilar junction and the left posterior-inferior cerebellar artery which is hypoplastic. Suggestion of a left anterior-inferior cerebellar artery/posterior-inferior cerebellar artery complex is seen. The opacified portion of the basilar artery, the posterior cerebral arteries, the superior cerebellar arteries and the anterior-inferior cerebellar arteries is noted into the capillary and venous phases. Unopacified blood is seen in the basilar artery from the contralateral vertebral artery. 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 PLAN: Follow-up with the referring neurologist. Electronically Signed   By: Luanne Bras M.D.   On: 12/21/2018 13:24   Ir Angio Vertebral Sel Subclavian Innominate Uni R Mod Sed  Result Date: 12/24/2018 CLINICAL DATA:  Left-sided facial droop, severe dysarthria, and left upper weakness. CT angiogram revealing occlusion of the right middle cerebral artery questionable M2 division. EXAM: IR ANGIO VERTEBRAL SEL SUBCLAVIAN INNOMINATE UNI RIGHT MOD SED; IR ANGIO VERTEBRAL SEL VERTEBRAL UNI LEFT MOD SED; BILATERAL COMMON CAROTID AND INNOMINATE  ANGIOGRAPHY COMPARISON:  CT angiogram of the head and neck of 12/28/2018. MEDICATIONS: Heparin none units IV; Ancef 2 g IV antibiotic was administered within 1 hour of the procedure. ANESTHESIA/SEDATION: General anesthesia. CONTRAST:  Isovue 300 approximately 50 mL. FLUOROSCOPY TIME:  Fluoroscopy Time: 5 minutes 0 seconds (712 mGy). COMPLICATIONS: None immediate. TECHNIQUE: Informed written consent was obtained from the patient's mother after a thorough discussion of the procedural risks, benefits and alternatives. All questions were addressed. Maximal Sterile Barrier Technique was utilized including caps, mask, sterile gowns, sterile gloves, sterile drape, hand hygiene and skin antiseptic. A timeout was performed prior to the initiation of the procedure. The right groin was prepped and draped in the usual sterile fashion. Thereafter using modified Seldinger technique, transfemoral access into the right common femoral artery was obtained without difficulty. Over a 0.035 inch guidewire, a 5 French Pinnacle sheath was inserted. Through this, and also over 0.035 inch guidewire, a 5 [place] JB 1 catheter was advanced to the aortic arch region and selectively positioned in the right common carotid artery, , the right subclavian artery, the left common carotid artery and the left vertebral artery. FINDINGS: The right subclavian arteriogram demonstrates hypoplastic right vertebral artery with a normal origin. The vessel is seen to opacify to the cranial skull base where it opacifies the right vertebrobasilar junction and the right posterior-inferior cerebellar artery. The opacified portion of the basilar artery is widely patent. The right common carotid arteriogram demonstrates the right external carotid artery and its major branches to be widely patent. The right internal carotid artery at the bulb to the cranial skull base demonstrates wide patency. The petrous, cavernous and the supraclinoid segments are widely patent.  The right middle cerebral artery and the right anterior cerebral artery opacify into the capillary and venous phases. There is a tapered severe stenosis followed by occlusion of a mid perisylvian branch of the superior division of the right middle cerebral artery in the M3 region. Extensive collaterals are seen retrogradely opacifying this distribution. Dural venous sinuses are widely patent. The left common carotid arteriogram demonstrates the left external carotid artery and its major branches to be widely patent. The left internal carotid artery at the bulb to the cranial skull base demonstrates wide patency. The petrous, cavernous and the supraclinoid segments are widely patent. The left middle cerebral artery demonstrates an early bifurcation a developmental variation, the left anterior cerebral artery opacifies with the left middle cerebral artery into the capillary and the venous phases. The left vertebral artery origin is widely patent. The vessel opacifies to the cranial skull base. Wide patency is seen of the left vertebrobasilar junction and the left posterior-inferior cerebellar artery which is hypoplastic. Suggestion of a left anterior-inferior cerebellar artery/posterior-inferior cerebellar artery complex is seen. The opacified portion of the basilar artery, the posterior cerebral arteries, the superior cerebellar arteries and the anterior-inferior cerebellar arteries is noted into the capillary and venous phases. Unopacified blood is seen in the basilar artery from the contralateral vertebral artery. 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 PLAN: Follow-up with the referring neurologist. Electronically Signed   By: Luanne Bras M.D.   On: 12/21/2018 13:24   Ir Angio Vertebral Sel Vertebral Uni L Mod Sed  Result Date: 12/24/2018 CLINICAL DATA:  Left-sided facial droop,  severe dysarthria, and left upper weakness. CT angiogram revealing occlusion of the right middle cerebral artery questionable M2 division. EXAM: IR ANGIO VERTEBRAL SEL SUBCLAVIAN INNOMINATE UNI RIGHT MOD SED; IR ANGIO VERTEBRAL SEL VERTEBRAL UNI LEFT MOD SED; BILATERAL COMMON CAROTID AND INNOMINATE ANGIOGRAPHY COMPARISON:  CT angiogram of the head and neck of 01/10/2019. MEDICATIONS:  Heparin none units IV; Ancef 2 g IV antibiotic was administered within 1 hour of the procedure. ANESTHESIA/SEDATION: General anesthesia. CONTRAST:  Isovue 300 approximately 50 mL. FLUOROSCOPY TIME:  Fluoroscopy Time: 5 minutes 0 seconds (712 mGy). COMPLICATIONS: None immediate. TECHNIQUE: Informed written consent was obtained from the patient's mother after a thorough discussion of the procedural risks, benefits and alternatives. All questions were addressed. Maximal Sterile Barrier Technique was utilized including caps, mask, sterile gowns, sterile gloves, sterile drape, hand hygiene and skin antiseptic. A timeout was performed prior to the initiation of the procedure. The right groin was prepped and draped in the usual sterile fashion. Thereafter using modified Seldinger technique, transfemoral access into the right common femoral artery was obtained without difficulty. Over a 0.035 inch guidewire, a 5 French Pinnacle sheath was inserted. Through this, and also over 0.035 inch guidewire, a 5 [place] JB 1 catheter was advanced to the aortic arch region and selectively positioned in the right common carotid artery, , the right subclavian artery, the left common carotid artery and the left vertebral artery. FINDINGS: The right subclavian arteriogram demonstrates hypoplastic right vertebral artery with a normal origin. The vessel is seen to opacify to the cranial skull base where it opacifies the right vertebrobasilar junction and the right posterior-inferior cerebellar artery. The opacified portion of the basilar artery is widely patent.  The right common carotid arteriogram demonstrates the right external carotid artery and its major branches to be widely patent. The right internal carotid artery at the bulb to the cranial skull base demonstrates wide patency. The petrous, cavernous and the supraclinoid segments are widely patent. The right middle cerebral artery and the right anterior cerebral artery opacify into the capillary and venous phases. There is a tapered severe stenosis followed by occlusion of a mid perisylvian branch of the superior division of the right middle cerebral artery in the M3 region. Extensive collaterals are seen retrogradely opacifying this distribution. Dural venous sinuses are widely patent. The left common carotid arteriogram demonstrates the left external carotid artery and its major branches to be widely patent. The left internal carotid artery at the bulb to the cranial skull base demonstrates wide patency. The petrous, cavernous and the supraclinoid segments are widely patent. The left middle cerebral artery demonstrates an early bifurcation a developmental variation, the left anterior cerebral artery opacifies with the left middle cerebral artery into the capillary and the venous phases. The left vertebral artery origin is widely patent. The vessel opacifies to the cranial skull base. Wide patency is seen of the left vertebrobasilar junction and the left posterior-inferior cerebellar artery which is hypoplastic. Suggestion of a left anterior-inferior cerebellar artery/posterior-inferior cerebellar artery complex is seen. The opacified portion of the basilar artery, the posterior cerebral arteries, the superior cerebellar arteries and the anterior-inferior cerebellar arteries is noted into the capillary and venous phases. Unopacified blood is seen in the basilar artery from the contralateral vertebral artery. IMPRESSION: Angiographically tapered occlusion of an anterior perisylvian branch of the superior division of  the right middle cerebral artery and the M3 region. PLAN: Follow-up with the referring neurologist. Electronically Signed   By: Luanne Bras M.D.   On: 12/21/2018 13:24     PHYSICAL EXAM:     Temp:  [98.4 F (36.9 C)-99.6 F (37.6 C)] 98.4 F (36.9 C) (05/20 1607) Pulse Rate:  [103-158] 110 (05/20 1747) Resp:  [11-28] 16 (05/20 1747) BP: (70-156)/(44-123) 122/76 (05/20 1747) SpO2:  [72 %-100 %] 100 % (05/20 1747) FiO2 (%):  [60 %]  60 % (05/20 1607)  General - Well nourished, well developed, intubated on sedation.  Ophthalmologic - fundi not visualized due to noncooperation.  Cardiovascular - Regular rhythm, persistent tachycardia.  Neuro - intubated on sedation, eyes closed, not following commands. With forced eye opening, disconjugate eyes with right eye in mid position and left eye mild right gaze position, not blinking to visual threat, doll's eyes absent, not tracking, pupil bilateral 2 mm, sluggish to light. Corneal reflex weak present, gag and cough present. Breathing over the vent.  Facial symmetry not able to test due to ET tube.  Tongue midline in mouth. On pain stimulation, mild withdraw right lower extremity only. DTR diminished and no babinski. Sensation, coordination and gait not tested.   ASSESSMENT/PLAN Thomas Adams is a 58 y.o. male with history of atrial fibrillation not on AC, HTN and ESRD on HD presenting with left hemiparesis and left facial droop with dysarthria. Sent to IR but no correctable lesion.  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 Small vessel disease. ASPECTS 10.     CTA head & neck suspected mid R M2 branch occlusion w/ 32mL penumbra R parietal. No LVO. Possible severe L MCA origin stenosis. Aortic atherosclerosis   CT perfusion no core  Cerebral angio RT MCA   Sup division M3 branch tapered occlusion. No  intervention  MRI  Multifocal B supratentorial infarcts.   2D Echo EF 65%. There are severe posterior mitral annular calcifications extending into the mitral lefalets with a mobile portion that most probably represents a large calcified mass partially detached from the leaflet. This can possibly be a source of embolism and stroke.  HIV negative   TEE not indicated per cardiology as meeting dx criteria for endocarditis  LDL 76  HgbA1c 5.5  SCDs for VTE prophylaxis. Hold Heparin given severe anemia   No antithrombotic prior to admission, now on No antithrombotic. Not an anticoagulation candidate due to endocarditis, risk of bleeding and severe anemia at this time. Hold off aspirin 81 now given severe anemia requiring PRBC.   Disposition:  Discussed with mom over the phone and involved palliative care and mom requested comfort care tomorrow.   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   Repeat Blood cultures 5/13 - negative  Repeat Blood cultures 5/18 - no growth x2 days  UCx no growth   Sputum culture ABUNDANT GRAM NEGATIVE RODS and gRAM POSITIVE COCCI IN PAIRS IN CHAINS  C.diff negative  Lactic acid 3.3->1.5  ID on board  Cardiology signed off  CVTS Roxy Manns) consulted- not a surgical candidate at this time.   Atrial Fibrillation w/ RVR . Home anticoagulation:  none  . On  Amiodarone   . HR 110-130 . Cardiology signed off . Not on metoprolol given hypotension . Not an AC candidate d/t endocarditis and risk for bleeding   Hypotension in  setting of sepsis  Hypertensive Emergency on admission   BP as high as 174/139 on admission  Home meds:  Norvasc 10, hydralazine 25 tid, labetalol 100 bid, lisinopril 40  BP 90-120s  Still on Levophed  . BP goal normotensive  Severe anemia   Hb 9.0-8.8-7.7-6.9-> 1u PRBC->8.2->8.9->6.8-> PRBC -> 8.0->8.1->7.1->6.5  Likely due to ESRD vs. Acute blood loss, CCM on board more concerning towards ESRD with CRRT  ASA on hold for now  Hold off transfusion due to planning of comfort care  Hyperlipidemia  Home meds:  No statin  LDL 75, goal < 70  AST/ALT 31/9  Dysphagia  On vent  TF per CCM  NPO  Other Stroke Risk Factors  Overweight, Body mass index is 31.25 kg/m., recommend weight loss, diet and exercise as appropriate   Other Active Problems  ESRD on HD TTS. Nephrology on board. Continued on CRRT  Secondary hyperparathyroidism   Thrombocytopenia in setting of sepsis [id number]  INR 1.7->1.5->1.5  Hospital day # 10   This patient is critically ill and at significant risk of neurological worsening, death and care requires constant monitoring of vital signs, hemodynamics, respiratory and cardiac monitoring, extensive review of multiple databases, frequent neurological assessment, discussion with CCM team and medical decision making of high complexity. I spent 30 minutes of neurocritical care time  in the care of  this patient.  Discussed with Dr. Elsworth Soho.    Rosalin Hawking, MD PhD Stroke Neurology 12/30/2018 6:29 PM   To contact Stroke Continuity provider, please refer to [email or web address] After hours, contact General Neurology

## 2018-12-30 NOTE — Progress Notes (Signed)
NAME:  Thomas Adams, MRN:  [id number], DOB:  1960-11-28, LOS: 69 ADMISSION DATE:  01/10/2019, CONSULTATION DATE:  12/30/18 REFERRING MD:  Dr. Germaine Pomfret , CHIEF COMPLAINT:  Left side weakness   Brief History   58 yr old with history of HTN, afib, febrile and coughing x 1 d, found with left facial droop, left sided weakness 5/10. 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 Brought to ICU intubated  Past Medical History  HTN, ESRD, dialysis TThSat, Afib w/ RVR  Significant Hospital Events   5/10 Angiogram in IR >> no clot retraction due to distal location of thrombus; hypotension from meds 5/11 d/c diprivan due to triglyceride levels 5/12 start CRRT; start amiodarone 5/14 Off pressors, on CRRT 5/17 difficult day yesterday, resume CRRT on pressors-being weaned 5/18 no significant changes overnight.  Seen by infectious disease for severe leukocytosis, repeat blood culture sent.  Of note chest x-ray actually looked worse.  Not clear if this representing new pneumonia or pulmonary edema. Seen by thoracic surg. Not candidate for surgery  5/19: Palliative care consulted  5/20: Remains pressor dependent.  Unresponsive.  Chest x-ray with what appears to be ARDS.  Spoke with mother, plan to transition to withdrawal of care and comfort on 5/21  Consults:  Nephrology  ID Cardiology   Procedures:  ETT 5/10 >>  Rt radial aline 5/10 >>  Rt IJ HD cath 5/11 >>  Left IJ triple-lumen catheter insertion 5/16  Significant Diagnostic Tests:  CT angio head/neck 5/10 >> Rt M2 occlusion, severe Lt MCA stenosis Echo 5/10 >> EF greater than 65%, severe LCH, severe MR MRI brain 5/11 >> multifocal acute ischemia scattered in both cerebral and cerebellar hemispheres 5/17-chest x-ray revealed improvement in congestion/infiltrate Micro Data:  COVID 5/10 >> negative Blood 5/10 >> MSSA Blood culture 5/18 Antimicrobials:  Cefepime 5/10 >> 5/11  Vancomycin 5/10 >> 5/11 Ancef 5/11 >>5/13 Nafcillin 5/13 > cdiff 5/19>>> Respiratory culture 5/19>>>  Subjective:    Objective   Blood pressure (Abnormal) 156/88, pulse (Abnormal) 142, temperature 99.6 F (37.6 C), temperature source Axillary, resp. rate 18, height 5\' 9"  (1.753 m), weight 96 kg, SpO2 94 %. CVP:  [5 mmHg-12 mmHg] 5 mmHg  Vent Mode: PRVC FiO2 (%):  [50 %-60 %] 60 % Set Rate:  [18 bmp] 18 bmp Vt Set:  [560 mL] 560 mL PEEP:  [8 cmH20-10 cmH20] 10 cmH20   Intake/Output Summary (Last 24 hours) at 12/30/2018 0805 Last data filed at 12/30/2018 0800 Gross per 24 hour  Intake 4885.29 ml  Output 4611 ml  Net 274.29 ml   Filed Weights   01/08/2019 1500 12/25/18 0440 12/27/18 0500  Weight: 85.3 kg 96.1 kg 96 kg    Examination:  General: This is a chronically ill-appearing 58 year old male he is unresponsive on mechanical ventilation HEENT normocephalic atraumatic does have some scleral edema he is orally intubated there is no clear JVD Pulmonary: Coarse diffuse rhonchus breath sounds continues to be quite asynchronous with mechanical ventilation.  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  This demonstrates diffuse pulmonary infiltrates worrisome for ARDS Plan Continue full ventilator support  VAP bundle  No further chest x-rays  Not a candidate for trach or PEG  Plan for one-way extubation and transition to comfort on 5/21    End-stage renal disease Plan We will continue CRRT through today and plan on discontinuing this in the morning  Anemia of critical illness, likely somewhat complicated by CRRT.  Hemoglobin down from 8.1-6.5 Got one unit this am Plan No further transfusion D/C CBCs  Thrombocytopenia: These are stable Plan No further labs   Atrial Fibrillation (chronic) Plan Cont amio  No ac Aiming towards w/d of care    Best practice:  Diet: Continue tube feeds DVT prophylaxis: SCDs, on heparin GI prophylaxis: Protonix Mobility: Bedrest Code Status: Full code Disposition: I spoke with his mother today [facility] via phone.  [name] is critically ill.  It appears as though he now has ARDS and possibly a ventilator associated  pneumonia.  Unfortunately the consequences of his acute illness are unlikely to allow him any sort of meaningful functional recovery which would be acceptable to the patient per my discussion with his mother.  Based on our discussion we will continue our current level of supportive care, we will not escalate.  And based on the patient's values we will plan on a one-way extubation on 5/21 with a transition to complete comfort.  The mother will join [place] at bedside on 5/21 as we proceed with this plan  My cct 33 min Erick Colace ACNP-BC [place] Pager # [phone] OR # [phone] if no answer

## 2018-12-31 LAB — BPAM RBC
Blood Product Expiration Date: 202006102359
ISSUE DATE / TIME: 202005200547
Unit Type and Rh: 5100

## 2018-12-31 LAB — RENAL FUNCTION PANEL
Albumin: 1.7 g/dL — ABNORMAL LOW (ref 3.5–5.0)
Anion gap: 10 (ref 5–15)
BUN: 45 mg/dL — ABNORMAL HIGH (ref 6–20)
CO2: 24 mmol/L (ref 22–32)
Calcium: 9.3 mg/dL (ref 8.9–10.3)
Chloride: 104 mmol/L (ref 98–111)
Creatinine, Ser: 1.98 mg/dL — ABNORMAL HIGH (ref 0.61–1.24)
GFR calc Af Amer: 43 mL/min — ABNORMAL LOW (ref >60)
GFR calc non Af Amer: 37 mL/min — ABNORMAL LOW (ref >60)
Glucose, Bld: 148 mg/dL — ABNORMAL HIGH (ref 70–99)
Phosphorus: 3.9 mg/dL (ref 2.5–4.6)
Potassium: 4.1 mmol/L (ref 3.5–5.1)
Sodium: 138 mmol/L (ref 135–145)

## 2018-12-31 LAB — TYPE AND SCREEN
ABO/RH(D): O POS
Antibody Screen: NEGATIVE
Unit division: 0

## 2018-12-31 LAB — POCT ACTIVATED CLOTTING TIME
Activated Clotting Time: 197 seconds
Activated Clotting Time: 202 seconds
Activated Clotting Time: 230 seconds
Activated Clotting Time: 230 seconds
Activated Clotting Time: 230 seconds
Activated Clotting Time: 213 seconds

## 2018-12-31 LAB — CULTURE, RESPIRATORY W GRAM STAIN: Culture: NORMAL

## 2018-12-31 LAB — GLUCOSE, CAPILLARY
Glucose-Capillary: 129 mg/dL — ABNORMAL HIGH (ref 70–99)
Glucose-Capillary: 133 mg/dL — ABNORMAL HIGH (ref 70–99)
Glucose-Capillary: 106 mg/dL — ABNORMAL HIGH (ref 70–99)

## 2018-12-31 LAB — MAGNESIUM: Magnesium: 2.7 mg/dL — ABNORMAL HIGH (ref 1.7–2.4)

## 2018-12-31 LAB — APTT: aPTT: >200 seconds (ref 24–36)

## 2018-12-31 MED ORDER — GLYCOPYRROLATE 0.2 MG/ML IJ SOLN: 0.2000 mg | INTRAMUSCULAR | Status: DC | PRN

## 2018-12-31 MED ORDER — POLYVINYL ALCOHOL 1.4 % OP SOLN
1.0000 [drp] | Freq: Four times a day (QID) | OPHTHALMIC | Status: DC | PRN
  Filled 2018-12-31: qty 15

## 2018-12-31 MED ORDER — ONDANSETRON HCL 4 MG/2ML IJ SOLN: 4.0000 mg | Freq: Four times a day (QID) | INTRAMUSCULAR | Status: DC | PRN

## 2018-12-31 MED ORDER — ONDANSETRON 4 MG PO TBDP
4.0000 mg | ORAL_TABLET | Freq: Four times a day (QID) | ORAL | Status: DC | PRN
  Filled 2018-12-31: qty 1

## 2018-12-31 MED ORDER — HALOPERIDOL LACTATE 5 MG/ML IJ SOLN: 0.5000 mg | INTRAMUSCULAR | Status: DC | PRN

## 2018-12-31 MED ORDER — HALOPERIDOL LACTATE 2 MG/ML PO CONC
0.5000 mg | ORAL | Status: DC | PRN
  Filled 2018-12-31: qty 0.3

## 2018-12-31 MED ORDER — BIOTENE DRY MOUTH MT LIQD: 15.0000 mL | OROMUCOSAL | Status: DC | PRN

## 2018-12-31 MED ORDER — HALOPERIDOL LACTATE 5 MG/ML IJ SOLN: 5.0000 mg | Freq: Once | INTRAMUSCULAR | Status: DC

## 2018-12-31 MED ORDER — HALOPERIDOL 0.5 MG PO TABS
0.5000 mg | ORAL_TABLET | ORAL | Status: DC | PRN
  Filled 2018-12-31: qty 1

## 2018-12-31 MED ORDER — GLYCOPYRROLATE 1 MG PO TABS
1.0000 mg | ORAL_TABLET | ORAL | Status: DC | PRN
  Filled 2018-12-31: qty 1

## 2018-12-31 MED ORDER — MORPHINE 100MG IN NS 100ML (1MG/ML) PREMIX INFUSION
10.0000 mg/h | INTRAVENOUS | Status: DC
  Administered 2018-12-31: 10 mg/h via INTRAVENOUS
  Filled 2018-12-31 (×2): qty 100

## 2018-12-31 MED ORDER — LORAZEPAM 2 MG/ML IJ SOLN: 2.0000 mg | INTRAMUSCULAR | Status: DC | PRN

## 2019-01-01 LAB — VITAMIN C: Vitamin C: 0.3 mg/dL — ABNORMAL LOW (ref 0.4–2.0)

## 2019-01-02 LAB — CULTURE, BLOOD (ROUTINE X 2)
Culture: NO GROWTH
Culture: NO GROWTH
Special Requests: ADEQUATE
Special Requests: ADEQUATE

## 2019-01-11 NOTE — Progress Notes (Signed)
CRITICAL VALUE ALERT  Critical Value:  APTT>200 sec  Date & Time Notified:  2019-01-14 6226  Provider Notified: Dr Augustin Coupe  Orders Received/Actions taken: Drop continuous heparin in filter to 1,200units/hr and recheck aPTT in 4 hours. Orders placed.

## 2019-01-11 NOTE — Progress Notes (Signed)
Paige, CDS representative states pt is not a candidate for donation and may go to funeral home.

## 2019-01-11 NOTE — Progress Notes (Signed)
Spoke with Pt's nurse and she will call chaplain when family arrive and she will ask wife if they want a chaplain presence  If extubation is to take place as scheduled.  Chaplain available as needed.  Jaclynn Major, [place], [facility], Pager [phone]

## 2019-01-11 NOTE — Progress Notes (Signed)
STROKE TEAM PROGRESS NOTE   INTERVAL HISTORY Pt still unresponsive, on vent, on precedex and morphine drip and off CCRT this am. BP stable on low dose levophed. Cre improved. Still tachycardia. CCM discussed with mom, will do comfort care measures this pm.   Vitals:   09-Jan-2019 0700 2019/01/09 0730 Jan 09, 2019 0754 Jan 09, 2019 0800  BP: 108/85 101/85    Pulse:  (!) 113    Resp: 14 12    Temp:    (!) 97.4 F (36.3 C)  TempSrc:    Axillary  SpO2: 100% 100% 100%   Weight:      Height:        CBC:  Recent Labs  Lab 12/29/18 1329 12/30/18 0319  WBC 45.8* 45.2*  HGB 7.0* 6.5*  HCT 21.6* 20.4*  MCV 95.6 96.2  PLT 147* 937    Basic Metabolic Panel:  Recent Labs  Lab 12/30/18 0319 12/30/18 1524 01-09-19 0437  NA 138 138 138  K 4.7 4.9 4.1  CL 103 104 104  CO2 22 22 24   GLUCOSE 135* 132* 148*  BUN 43* 44* 45*  CREATININE 2.30* 2.19* 1.98*  CALCIUM 8.7* 8.8* 9.3  MG 2.5*  --  2.7*  PHOS 4.3 4.5 3.9   IMAGING  Ct Angio Head W Or Wo Contrast Ct Angio Neck W Or Wo Contrast Ct Cerebral Perfusion W Contrast 12/16/2018 IMPRESSION:  1. Motion degraded examination with limited assessment of the small and medium-sized intracranial arteries.  2. Suspected mid right M2 branch vessel occlusion with 9 mL penumbra in the right parietal lobe and no core infarct based on CTP.  3. No more proximal large vessel occlusion.  4. Patent cervical carotid arteries without evidence of significant stenosis.  5. 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  6. Possible severe left MCA origin stenosis.  7.  Aortic Atherosclerosis (ICD10-I70.0).    Mr Brain Wo Contrast 12/21/2018 IMPRESSION:  1. Multifocal acute ischemia scattered throughout both cerebral and cerebellar hemispheres, with the largest area located in the right frontal operculum and insula.  2. Multifocal petechiae/microhemorrhage at the sites of ischemic infarction. No intraparenchymal hematoma or mass  effect.  3. Abnormal flow void in the right transverse sinus could indicate dural venous sinus thrombosis. However, on the earlier CTA and the interventional angiogram, the sinus was clearly patent.    Ct Head Code Stroke Wo Contrast 12/29/2018 IMPRESSION:  1. No evidence of acute intracranial abnormality.  2. ASPECTS is 10.  3. Moderate chronic small vessel ischemic disease.    Ir Angio Intra Extracran Sel Com Carotid Innominate Bilat Mod Sed 12/24/2018  IMPRESSION:  Angiographically tapered occlusion of an anterior perisylvian branch of the superior division of the right middle cerebral artery and the M3 region. PLAN: Follow-up with the referring neurologist.    Ir Angio Vertebral Sel Vertebral Uni L Mod Sed 12/24/2018 IMPRESSION:  Angiographically tapered occlusion of an anterior perisylvian branch of the superior division of the right middle cerebral artery and the M3 region. PLAN: Follow-up with the referring neurologist.     Dg Chest Port 1 View 12/30/2018 IMPRESSION:  Stable hardware positioning and symmetric, extensive airspace disease.   Dg Chest Port 1 View 12/29/2018 IMPRESSION:  Stable lines and tubes.  No significant interval change.   Dg Chest Port 1 View 12/28/2018 IMPRESSION:  Worsening bilateral airspace disease.   Dg Chest Port 1 View 12/27/2018 IMPRESSION:  1. Support apparatus stable.  2. Slight improved aeration of both lungs.  3.  Persistent diffuse interstitial and airspace disease.   Dg Chest Port 1 View 12/26/2018 IMPRESSION:  1. Interval placement of a left IJ catheter with tip projecting over the cavoatrial junction. No pneumothorax identified.  2. No change in aeration to lungs compared with previous exam.   Dg Chest Port 1 View 12/26/2018 IMPRESSION:  Endotracheal tube terminates 2.5 cm above the carina. Multifocal pneumonia, mildly progressive.   Dg Chest Port 1 View 12/25/2018 IMPRESSION:  1. Stable lines and tubes.  2. Improved  ventilation since yesterday with decreasing indistinct perihilar opacity.  3. No new cardiopulmonary abnormality.   Dg Chest Port 1 View 12/24/2018 IMPRESSION:  Worsening bilateral perihilar lung opacities, left greater than right. This may be pulmonary edema, ARDS, or multifocal pneumonia. More confluent opacities at the bases is also progressed.  Dg Chest Port 1 View 12/23/2018 IMPRESSION:  1. Stable lines and tubes.  2. Mildly larger lung volumes and regressed left lower lobe collapse or consolidation.  3. Asymmetric interstitial opacity in the left lung persists, favor infection over asymmetric edema.  4. No new cardiopulmonary abnormality.   Dg Chest Port 1 View  12/22/2018 IMPRESSION:  1. Enteric tube side hole at the level of the GE junction, advance 5 cm to ensure side hole placement within the stomach.  2.  Otherwise stable lines and tubes.  3. Stable ventilation with left lower lobe collapse or consolidation and patchy additional perihilar opacity.   Dg Chest Port 1 View 12/21/2018 IMPRESSION:  Hazy opacification over the left lung which may be due to layering effusion with atelectasis. Mild hazy opacification of the perihilar regions which suggest mild interstitial edema. Tubes and lines as described.   Dg Chest Portable 1 View 01/01/2019 IMPRESSION:  Endotracheal tube with tip 4 cm above the carina. OG tube with tip overlying the distal esophagus-recommend advancement. No acute cardiopulmonary disease.    Dg Abd Portable 1 View 01/01/2019 IMPRESSION:  OG tube with tip overlying the distal esophagus-recommend advancement.    PHYSICAL EXAM:     Temp:  [97.4 F (36.3 C)-98.7 F (37.1 C)] 97.4 F (36.3 C) (05/21 0800) Pulse Rate:  [108-145] 113 (05/21 0730) Resp:  [9-28] 12 (05/21 0730) BP: (77-135)/(51-103) 101/85 (05/21 0730) SpO2:  [87 %-100 %] 100 % (05/21 0754) FiO2 (%):  [60 %] 60 % (05/21 0754) Weight:  [95.1 kg] 95.1 kg (05/21 0500)  General - Well  nourished, well developed, intubated on sedation.  Ophthalmologic - fundi not visualized due to noncooperation.  Cardiovascular - Regular rhythm, persistent tachycardia.  Neuro - intubated on precedex and morphine drip, eyes closed, not following commands. With forced eye opening, disconjugate eyes with right eye in mid position and left eye mild right gaze position, not blinking to visual threat, doll's eyes absent, not tracking, pupil bilateral 2 mm, sluggish to light. Corneal reflex weak present, gag and cough present. Breathing over the vent.  Facial symmetry not able to test due to ET tube.  Tongue midline in mouth. On pain stimulation, mild withdraw right upper and lower extremities. No movement of left upper and lower extremities. DTR diminished and no babinski. Sensation, coordination and gait not tested.   ASSESSMENT/PLAN [name] is a 58 y.o. male with history of atrial fibrillation not on AC, HTN and ESRD on HD presenting with left hemiparesis and left facial droop with dysarthria. Sent to IR but no correctable lesion.  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 Small vessel disease. ASPECTS 10.     CTA head & neck suspected mid R M2 branch occlusion w/ 57mL penumbra R parietal. No LVO. Possible severe L MCA origin stenosis. Aortic atherosclerosis   CT perfusion no core  Cerebral angio RT MCA   Sup division M3 branch tapered occlusion. No intervention  MRI  Multifocal B supratentorial infarcts.   2D Echo EF 65%. There are severe posterior mitral annular calcifications extending into the mitral lefalets with a mobile portion that most probably represents a large calcified mass partially detached from the leaflet. This can possibly be a source of embolism and stroke.  HIV negative   TEE not indicated per cardiology as meeting dx criteria for  endocarditis  LDL 76  HgbA1c 5.5  SCDs for VTE prophylaxis. Hold Heparin given severe anemia   No antithrombotic prior to admission, now on No antithrombotic. Not an anticoagulation candidate due to endocarditis, risk of bleeding and severe anemia at this time. Hold off aspirin 81 now given severe anemia requiring PRBC.   Disposition:  Discussed with mom over the phone and involved palliative care and mom requested comfort care tomorrow.   Acute Hypoxic Respiratory Failure, compromised airway  Intubated initially for IR  CXR worsening bilateral airspace disease  Remains on sedation  Not a candidate for extubation  PCCM on board  Mom requested comfort care 01/08/19  Sepsis w/ MSSA bacteremia in setting of new severe MR with mobile mass, likely endocarditis  WBC 18.7->22.9-> 34.8->48.5->46.1->45.8->45.2  Temp in ED 103, TMax past 24h 100.4->99.3->97.9->99.1->97.4  On nafcillin  2D Echo severe posterior mitral annular calcifications extending into the mitral lefalets with a mobile portion that most probably represents a large calcified mass partially detached from the leaflet.   Repeat Blood cultures 5/13 - negative  Repeat Blood cultures 5/18 - no growth x2 days  UCx no growth   Sputum culture ABUNDANT GRAM NEGATIVE RODS and gRAM POSITIVE COCCI IN PAIRS IN CHAINS  C.diff negative  Lactic acid 3.3->1.5  ID on board  Cardiology signed off  CVTS Thomas Adams) consulted- not a surgical candidate at this time.   Atrial Fibrillation w/ RVR . Home anticoagulation:  none  . On Amiodarone   . HR 100-120 . Cardiology signed off . Not on metoprolol given hypotension . Not an AC candidate d/t endocarditis and risk for bleeding   Hypotension in setting of sepsis  Hypertensive Emergency on admission   BP as high as 174/139 on admission  Home meds:  Norvasc 10, hydralazine 25 tid, labetalol 100 bid, lisinopril 40  BP 90-120s  Still on Levophed  . BP goal  normotensive  Severe anemia   Hb 9.0-8.8-7.7-6.9-> 1u PRBC->8.2->8.9->6.8-> PRBC -> 8.0->8.1->7.1->6.5  Likely due to ESRD vs. Acute blood loss, CCM on board more concerning towards ESRD with CRRT  ASA on hold for now  Hold off transfusion due to planning of comfort care  Hyperlipidemia  Home meds:  No statin  LDL 75, goal < 70  AST/ALT 31/9  Dysphagia  On vent  TF per CCM  NPO  ESRD on HD  Has been on CRRT for days  Just off this am  Cre 2.22-2.30-2.19-1.98  Other Stroke Risk Factors  Overweight, Body mass index is 30.96 kg/m., recommend weight loss, diet and exercise as appropriate   Other Active Problems  ESRD on HD TTS. Nephrology on board. Continued on CRRT - 45/1.98  Secondary hyperparathyroidism   Thrombocytopenia in setting of sepsis  [id number]  INR 1.7->1.5->1.5  PTT > 200  Hypermagnesemia - 2.7   PLAN  Comfort Care - Palliative Care onoard -> IV Morphine now, will full comfort this pm when mom comes (still on other meds as well)  Hospital day # 11   Rosalin Hawking, MD PhD Stroke Neurology 01/12/19 9:20 AM        To contact Stroke Continuity provider, please refer to [email or web address] After hours, contact General Neurology

## 2019-01-11 NOTE — Death Summary Note (Signed)
DEATH SUMMARY   Patient Details  Name: Thomas Adams MRN: [id number] DOB: 30-Dec-1960  Admission/Discharge Information   Admit Date:  01-12-19  Date of Death: Date of Death: 01/23/2019  Time of Death: Time of Death: 1635/11/24  Length of Stay: 11-13-22  Referring Physician: Helen Adams., MD   Reason(s) for Hospitalization  Stroke   Diagnoses  Preliminary cause of death:  Secondary Diagnoses (including complications and co-morbidities):  Principal Problem:   MSSA bacteremia Active Problems:   CKD (chronic kidney disease) stage V requiring chronic dialysis ([place])   End stage renal disease ([place])   Encounter for central line placement   Middle cerebral artery embolism, right   Septic shock ([place])   Acute respiratory failure with hypoxia ([facility])   Paroxysmal atrial fibrillation ([place])   Bacterial endocarditis, presumed   Fever   Acute embolic stroke, bilateral hemispheres   Goals of care, counseling/discussion   Palliative care by specialist   Brief Hospital Course (including significant findings, care, treatment, and services provided and events leading to death)  Thomas Adams is a 58 y.o. year old male with history of atrial fibrillation not on AC, HTN and ESRD on HD presenting with left hemiparesis and left facial droop with dysarthria. Sent to IR but no correctable lesion.  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 Small vessel disease. ASPECTS 10.     CTA head & neck suspected mid R M2 branch occlusion w/ 14mL penumbra R parietal. No LVO. Possible severe L MCA origin stenosis. Aortic atherosclerosis   CT perfusion no core  Cerebral angio RT MCA Sup division M3 branch tapered occlusion. No intervention  MRI  Multifocal B supratentorial infarcts.   2D Echo EF 65%. There are severe posterior mitral annular calcifications extending into the mitral lefalets  with a mobile portion that most probably represents a large calcified mass partially detached from the leaflet. This can possibly be a source of embolism and stroke.  HIV negative   TEE not indicated per cardiology as meeting dx criteria for endocarditis  LDL 76  HgbA1c 5.5  SCDs for VTE prophylaxis. Hold Heparin given severe anemia   No antithrombotic prior to admission, now on No antithrombotic. Not an anticoagulation candidate due to endocarditis, risk of bleeding and severe anemia at this time. aspirin 81 on hold given severe anemia requiring PRBC.   Disposition:  Discussed with mom over the phone and involved palliative care and mom requested comfort care on Jan 23, 2019. Pt passed away [id number] 01-23-2019  Acute Hypoxic Respiratory Failure, compromised airway  Intubated initially for IR  CXR worsening bilateral airspace disease  Remains on sedation  Not a candidate for extubation  PCCM on board  Mom requested comfort care 2019/01/23  Sepsis w/ MSSA bacteremia in setting of new severe MR with mobile mass, likely endocarditis  WBC 18.7->22.9-> 34.8->48.5->46.1->45.8->45.2  Temp in ED 103, TMax past 24h 100.4->99.3->97.9->99.1->97.4  On nafcillin  2D Echo severe posterior mitral annular calcifications extending into the mitral lefalets with a mobile portion that most probably represents a large calcified mass partially detached from the leaflet.   Repeat Blood cultures 5/13 - negative  Repeat Blood cultures 5/18 - no growth x2 days  UCx no growth   Sputum culture ABUNDANT GRAM NEGATIVE RODS and gRAM POSITIVE COCCI IN PAIRS IN CHAINS  C.diff negative  Lactic acid 3.3->1.5  ID on board  Cardiology signed off  CVTS (  Thomas Adams) consulted- not a surgical candidate at this time.   Atrial Fibrillation w/ RVR  Home anticoagulation:  none   On Amiodarone    HR 100-120  Cardiology signed off  Not on metoprolol given hypotension  Not an AC candidate d/t endocarditis and  risk for bleeding   Hypotension in setting of sepsis  Hypertensive Emergency on admission   BP as high as 174/139 on admission  Home meds:  Norvasc 10, hydralazine 25 tid, labetalol 100 bid, lisinopril 40  BP 90-120s  was on Levophed   Severe anemia   Hb 9.0-8.8-7.7-6.9-> 1u PRBC->8.2->8.9->6.8-> PRBC -> 8.0->8.1->7.1->6.5  Likely due to ESRD vs. Acute blood loss, CCM on board more concerning towards ESRD with CRRT  ASA on hold   Hyperlipidemia  Home meds:  No statin  LDL 75, goal < 70  AST/ALT 31/9  Dysphagia  On vent  Was on TF  NPO  ESRD on HD  Has been on CRRT for days  Off CRRT shortly before withdraw  Cre 2.22-2.30-2.19-1.98  Other Stroke Risk Factors  Overweight, Body mass index is 30.96 kg/m., recommend weight loss, diet and exercise as appropriate   Other Active Problems  ESRD on HD TTS. Nephrology on board. Continued on CRRT - 45/1.98  Secondary hyperparathyroidism   Thrombocytopenia in setting of sepsis [id number]  INR 1.7->1.5->1.5  PTT > 200 due to heparin use for CRRT  Hypermagnesemia - 2.7     Pertinent Labs and Studies  Significant Diagnostic Studies Ct Angio Head W Or Wo Contrast  Result Date: 01/09/2019 CLINICAL DATA:  Left-sided weakness, facial droop, and slurred speech. EXAM: CT ANGIOGRAPHY HEAD AND NECK CT PERFUSION BRAIN TECHNIQUE: Multidetector CT imaging of the head and neck was performed using the standard protocol during bolus administration of intravenous contrast. Multiplanar CT image reconstructions and MIPs were obtained to evaluate the vascular anatomy. Carotid stenosis measurements (when applicable) are obtained utilizing NASCET criteria, using the distal internal carotid diameter as the denominator. Multiphase CT imaging of the brain was performed following IV bolus contrast injection. Subsequent parametric perfusion maps were calculated using RAPID software. CONTRAST:  111mL OMNIPAQUE  IOHEXOL 350 MG/ML SOLN COMPARISON:  None. FINDINGS: CTA NECK FINDINGS The study is motion degraded including moderate to severe motion artifact through the larynx which limits assessment of the carotid bifurcations. Aortic arch: Standard 3 vessel aortic arch with mild atherosclerotic plaque. No significant arch vessel origin stenosis. Right carotid system: Patent with moderate calcified atherosclerosis about the carotid bifurcation. No evidence of dissection or significant stenosis within limitations of motion artifact. Left carotid system: Patent with mild calcified atherosclerosis about the carotid bifurcation without evidence of significant stenosis or dissection. Vertebral arteries: Patent with the left being mildly dominant. Calcified atherosclerosis at the vertebral artery origins results in moderate to severe stenosis bilaterally. Skeleton: Mild cervical disc degeneration. 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 Upper chest: Clear lung apices. Review of the MIP images confirms the above findings CTA HEAD FINDINGS Anterior circulation: The internal carotid arteries are patent from skull base to carotid termini with calcified atherosclerosis resulting in mild cavernous stenosis on the right. The right M1 segment is widely patent. Branch vessel assessment is limited by motion artifact, however there is a suspected mid right M2 branch vessel occlusion. The left MCA appears duplicated with suggestion of severe stenosis of the more superior origin although motion artifact may be contributing to this appearance. ACAs are patent without evidence of flow limiting proximal stenosis.  No aneurysm is identified. Posterior circulation: The intracranial vertebral arteries are patent to the basilar without evidence of significant stenosis. P ICAs and a ICAs are not well evaluated. SCA is are patent. The basilar artery is widely patent. There is a small right posterior communicating artery.  PCAs are patent without evidence of flow limiting proximal stenosis allowing for suspected artifactual narrowing in the proximal right P2 segment. No aneurysm is identified. Venous sinuses: Patent. Anatomic variants: None. Review of the MIP images confirms the above findings CT Brain Perfusion Findings: ASPECTS: 10 CBF (<30%) Volume: 56mL Perfusion (Tmax>6.0s) volume: 43mL Mismatch Volume: 105mL Infarction Location: No core infarct by standard RAPID criteria. Penumbra anterolaterally in the right parietal lobe. IMPRESSION: 1. Motion degraded examination with limited assessment of the small and medium-sized intracranial arteries. 2. Suspected mid right M2 branch vessel occlusion with 9 mL penumbra in the right parietal lobe and no core infarct based on CTP. 3. No more proximal large vessel occlusion. 4. Patent cervical carotid arteries without evidence of significant stenosis. 5. 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 6. Possible severe left MCA origin stenosis. 7.  Aortic Atherosclerosis (ICD10-I70.0). These results were called by telephone at the time of interpretation on 01/06/2019 at 12:29 pm to Dr. Kerney Elbe , who verbally acknowledged these results. Electronically Signed   By: Logan Bores M.D.   On: 01/07/2019 12:59   Ct Angio Neck W Or Wo Contrast  Result Date: 12/25/2018 CLINICAL DATA:  Left-sided weakness, facial droop, and slurred speech. EXAM: CT ANGIOGRAPHY HEAD AND NECK CT PERFUSION BRAIN TECHNIQUE: Multidetector CT imaging of the head and neck was performed using the standard protocol during bolus administration of intravenous contrast. Multiplanar CT image reconstructions and MIPs were obtained to evaluate the vascular anatomy. Carotid stenosis measurements (when applicable) are obtained utilizing NASCET criteria, using the distal internal carotid diameter as the denominator. Multiphase CT imaging of the brain was performed following IV bolus contrast injection.  Subsequent parametric perfusion maps were calculated using RAPID software. CONTRAST:  19mL OMNIPAQUE IOHEXOL 350 MG/ML SOLN COMPARISON:  None. FINDINGS: CTA NECK FINDINGS The study is motion degraded including moderate to severe motion artifact through the larynx which limits assessment of the carotid bifurcations. Aortic arch: Standard 3 vessel aortic arch with mild atherosclerotic plaque. No significant arch vessel origin stenosis. Right carotid system: Patent with moderate calcified atherosclerosis about the carotid bifurcation. No evidence of dissection or significant stenosis within limitations of motion artifact. Left carotid system: Patent with mild calcified atherosclerosis about the carotid bifurcation without evidence of significant stenosis or dissection. Vertebral arteries: Patent with the left being mildly dominant. Calcified atherosclerosis at the vertebral artery origins results in moderate to severe stenosis bilaterally. Skeleton: Mild cervical disc degeneration. 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 Upper chest: Clear lung apices. Review of the MIP images confirms the above findings CTA HEAD FINDINGS Anterior circulation: The internal carotid arteries are patent from skull base to carotid termini with calcified atherosclerosis resulting in mild cavernous stenosis on the right. The right M1 segment is widely patent. Branch vessel assessment is limited by motion artifact, however there is a suspected mid right M2 branch vessel occlusion. The left MCA appears duplicated with suggestion of severe stenosis of the more superior origin although motion artifact may be contributing to this appearance. ACAs are patent without evidence of flow limiting proximal stenosis. No aneurysm is identified. Posterior circulation: The intracranial vertebral arteries are patent to the  basilar without evidence of significant stenosis. P ICAs and a ICAs are not well evaluated. SCA is are  patent. The basilar artery is widely patent. There is a small right posterior communicating artery. PCAs are patent without evidence of flow limiting proximal stenosis allowing for suspected artifactual narrowing in the proximal right P2 segment. No aneurysm is identified. Venous sinuses: Patent. Anatomic variants: None. Review of the MIP images confirms the above findings CT Brain Perfusion Findings: ASPECTS: 10 CBF (<30%) Volume: 6mL Perfusion (Tmax>6.0s) volume: 42mL Mismatch Volume: 78mL Infarction Location: No core infarct by standard RAPID criteria. Penumbra anterolaterally in the right parietal lobe. IMPRESSION: 1. Motion degraded examination with limited assessment of the small and medium-sized intracranial arteries. 2. Suspected mid right M2 branch vessel occlusion with 9 mL penumbra in the right parietal lobe and no core infarct based on CTP. 3. No more proximal large vessel occlusion. 4. Patent cervical carotid arteries without evidence of significant stenosis. 5. 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 6. Possible severe left MCA origin stenosis. 7.  Aortic Atherosclerosis (ICD10-I70.0). These results were called by telephone at the time of interpretation on 12/19/2018 at 12:29 pm to Dr. Kerney Elbe , who verbally acknowledged these results. Electronically Signed   By: Logan Bores M.D.   On: 12/27/2018 12:59   Mr Brain Wo Contrast  Result Date: 12/21/2018 CLINICAL DATA:  Stroke follow-up EXAM: MRI HEAD WITHOUT CONTRAST TECHNIQUE: Multiplanar, multiecho pulse sequences of the brain and surrounding structures were obtained without intravenous contrast. COMPARISON:  CTA head neck 12/23/2018 FINDINGS: BRAIN: Multifocal abnormal diffusion restriction within both cerebellar hemispheres and scattered throughout multiple bilateral supratentorial territories. The largest area of ischemia is in the right frontal operculum and insula. The midline structures are normal. No  midline shift or other mass effect. Early confluent hyperintense T2-weighted signal of the periventricular and deep white matter, most commonly due to chronic ischemic microangiopathy. Generalized atrophy without lobar predilection. Multiple scattered foci of microhemorrhage, many of which correspond to areas of ischemia. No mass lesion. VASCULAR: Abnormal flow void within the right transverse sinus. The arterial flow voids are preserved. SKULL AND UPPER CERVICAL SPINE: Calvarial bone marrow signal is normal. There is no skull base mass. Visualized upper cervical spine and soft tissues are normal. SINUSES/ORBITS: Fluid level in the maxillary sinuses. The orbits are normal. IMPRESSION: 1. Multifocal acute ischemia scattered throughout both cerebral and cerebellar hemispheres, with the largest area located in the right frontal operculum and insula. 2. Multifocal petechiae/microhemorrhage at the sites of ischemic infarction. No intraparenchymal hematoma or mass effect. 3. Abnormal flow void in the right transverse sinus could indicate dural venous sinus thrombosis. However, on the earlier CTA and the interventional angiogram, the sinus was clearly patent. Electronically Signed   By: Ulyses Jarred M.D.   On: 12/21/2018 02:28   Ct Cerebral Perfusion W Contrast  Result Date: 12/28/2018 CLINICAL DATA:  Left-sided weakness, facial droop, and slurred speech. EXAM: CT ANGIOGRAPHY HEAD AND NECK CT PERFUSION BRAIN TECHNIQUE: Multidetector CT imaging of the head and neck was performed using the standard protocol during bolus administration of intravenous contrast. Multiplanar CT image reconstructions and MIPs were obtained to evaluate the vascular anatomy. Carotid stenosis measurements (when applicable) are obtained utilizing NASCET criteria, using the distal internal carotid diameter as the denominator. Multiphase CT imaging of the brain was performed following IV bolus contrast injection. Subsequent parametric perfusion  maps were calculated using RAPID software. CONTRAST:  141mL OMNIPAQUE IOHEXOL 350 MG/ML SOLN COMPARISON:  None. FINDINGS: CTA NECK FINDINGS The study is motion degraded including moderate to severe motion artifact through the larynx which limits assessment of the carotid bifurcations. Aortic arch: Standard 3 vessel aortic arch with mild atherosclerotic plaque. No significant arch vessel origin stenosis. Right carotid system: Patent with moderate calcified atherosclerosis about the carotid bifurcation. No evidence of dissection or significant stenosis within limitations of motion artifact. Left carotid system: Patent with mild calcified atherosclerosis about the carotid bifurcation without evidence of significant stenosis or dissection. Vertebral arteries: Patent with the left being mildly dominant. Calcified atherosclerosis at the vertebral artery origins results in moderate to severe stenosis bilaterally. Skeleton: Mild cervical disc degeneration. 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 Upper chest: Clear lung apices. Review of the MIP images confirms the above findings CTA HEAD FINDINGS Anterior circulation: The internal carotid arteries are patent from skull base to carotid termini with calcified atherosclerosis resulting in mild cavernous stenosis on the right. The right M1 segment is widely patent. Branch vessel assessment is limited by motion artifact, however there is a suspected mid right M2 branch vessel occlusion. The left MCA appears duplicated with suggestion of severe stenosis of the more superior origin although motion artifact may be contributing to this appearance. ACAs are patent without evidence of flow limiting proximal stenosis. No aneurysm is identified. Posterior circulation: The intracranial vertebral arteries are patent to the basilar without evidence of significant stenosis. P ICAs and a ICAs are not well evaluated. SCA is are patent. The basilar artery is  widely patent. There is a small right posterior communicating artery. PCAs are patent without evidence of flow limiting proximal stenosis allowing for suspected artifactual narrowing in the proximal right P2 segment. No aneurysm is identified. Venous sinuses: Patent. Anatomic variants: None. Review of the MIP images confirms the above findings CT Brain Perfusion Findings: ASPECTS: 10 CBF (<30%) Volume: 75mL Perfusion (Tmax>6.0s) volume: 53mL Mismatch Volume: 9mL Infarction Location: No core infarct by standard RAPID criteria. Penumbra anterolaterally in the right parietal lobe. IMPRESSION: 1. Motion degraded examination with limited assessment of the small and medium-sized intracranial arteries. 2. Suspected mid right M2 branch vessel occlusion with 9 mL penumbra in the right parietal lobe and no core infarct based on CTP. 3. No more proximal large vessel occlusion. 4. Patent cervical carotid arteries without evidence of significant stenosis. 5. 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 6. Possible severe left MCA origin stenosis. 7.  Aortic Atherosclerosis (ICD10-I70.0). These results were called by telephone at the time of interpretation on 01/08/2019 at 12:29 pm to Dr. Kerney Elbe , who verbally acknowledged these results. Electronically Signed   By: Logan Bores M.D.   On: 12/24/2018 12:59   Dg Chest Port 1 View  Result Date: 12/30/2018 CLINICAL DATA:  Endotracheal tube EXAM: PORTABLE CHEST 1 VIEW COMPARISON:  Yesterday FINDINGS: Endotracheal tube tip between the clavicular heads and carina. The orogastric tube tip is at the proximal stomach. Bilateral central line in stable good position. Extensive bilateral airspace disease without improvement. Cardiomegaly. IMPRESSION: Stable hardware positioning and symmetric, extensive airspace disease. Electronically Signed   By: Monte Fantasia M.D.   On: 12/30/2018 10:14   Dg Chest Port 1 View  Result Date: 12/29/2018 CLINICAL  DATA:  Tachypnea. EXAM: PORTABLE CHEST 1 VIEW COMPARISON:  12/28/2018 FINDINGS: The right-sided central venous catheter, left-sided central venous catheter, endotracheal tube, and enteric tube all appear similar. Diffuse hazy bilateral airspace opacities are again noted. The cardiac  silhouette is increased in size but stable from prior study. No pneumothorax. Trace bilateral pleural effusions are suspected, however the left costophrenic angle is not fully visualized. IMPRESSION: Stable lines and tubes.  No significant interval change. Electronically Signed   By: Constance Holster M.D.   On: 12/29/2018 13:59   Dg Chest Port 1 View  Result Date: 12/28/2018 CLINICAL DATA:  Respiratory failure. EXAM: PORTABLE CHEST 1 VIEW COMPARISON:  12/27/2018 FINDINGS: Endotracheal tube terminates 4 cm above the carina. Bilateral jugular catheters are unchanged. An enteric tube courses into the upper abdomen. The cardiac silhouette is borderline enlarged. There is aortic atherosclerosis. Interstitial and patchy airspace opacities throughout both lungs have mildly to moderately increased in the interim. No sizable pleural effusion or pneumothorax is identified with note made incomplete imaging of the left costophrenic angle. IMPRESSION: Worsening bilateral airspace disease. Electronically Signed   By: Logan Bores M.D.   On: 12/28/2018 09:13   Dg Chest Port 1 View  Result Date: 12/27/2018 CLINICAL DATA:  Intubated EXAM: PORTABLE CHEST 1 VIEW COMPARISON:  One-view chest x-ray 12/26/2018 FINDINGS: Heart size is normal. Endotracheal tube terminates 5.5 cm above the carina, in satisfactory position. Left IJ line and right IJ sheath are stable. NG tube courses off the inferior border of the film. There is slight improved aeration. Patchy interstitial and airspace disease is again noted bilaterally. IMPRESSION: 1. Support apparatus stable. 2. Slight improved aeration of both lungs. 3. Persistent diffuse interstitial and airspace  disease. Electronically Signed   By: San Morelle M.D.   On: 12/27/2018 08:30   Dg Chest Port 1 View  Result Date: 12/26/2018 CLINICAL DATA:  Encounter for central line placement. EXAM: PORTABLE CHEST 1 VIEW COMPARISON:  12/26/2018 FINDINGS: There is a right IJ catheter with tip in the projection of the SVC. Left IJ catheter has been recently placed and tip is projecting over the cavoatrial junction. ET tube tip is above the carina. There is an enteric tube with tip just below the GE junction. Normal heart size. Diffuse bilateral pulmonary opacities are again noted and appear unchanged from previous exam IMPRESSION: 1. Interval placement of a left IJ catheter with tip projecting over the cavoatrial junction. No pneumothorax identified. 2. No change in aeration to lungs compared with previous exam. Electronically Signed   By: Kerby Moors M.D.   On: 12/26/2018 18:05   Dg Chest Port 1 View  Result Date: 12/26/2018 CLINICAL DATA:  Respiratory failure EXAM: PORTABLE CHEST 1 VIEW COMPARISON:  12/25/2018 FINDINGS: Endotracheal tube terminates 2.5 cm above the carina. Right IJ venous catheter terminates in the mid SVC. Enteric tube terminates in the gastric cardia. Multifocal patchy opacities in the lungs bilaterally, left upper lobe predominant, progressive from the prior and suspicious for multifocal pneumonia. No definite pleural effusions. No pneumothorax. The heart is top-normal in size. IMPRESSION: Endotracheal tube terminates 2.5 cm above the carina. Multifocal pneumonia, mildly progressive. Electronically Signed   By: Julian Hy M.D.   On: 12/26/2018 06:54   Dg Chest Port 1 View  Result Date: 12/25/2018 CLINICAL DATA:  58 year old male with cerebral infarcts, sepsis, bacteremia. EXAM: PORTABLE CHEST 1 VIEW COMPARISON:  12/24/2018 and earlier. FINDINGS: Portable AP semi upright view at 0644 hours. Stable endotracheal tube and right IJ central line. Enteric tube tip at the level of the  gastric body, the side hole is not clearly identified. Improved ventilation since yesterday with decreasing indistinct perihilar opacity. Mildly larger lung volumes. Stable cardiac size and mediastinal contours. No pneumothorax,  pleural effusion or areas of worsening ventilation. Stable visible bowel gas pattern. IMPRESSION: 1. Stable lines and tubes. 2. Improved ventilation since yesterday with decreasing indistinct perihilar opacity. 3. No new cardiopulmonary abnormality. Electronically Signed   By: Genevie Ann M.D.   On: 12/25/2018 09:21   Dg Chest Port 1 View  Result Date: 12/24/2018 CLINICAL DATA:  Acute respiratory failure. EXAM: PORTABLE CHEST 1 VIEW COMPARISON:  Yesterday. FINDINGS: Endotracheal tube, enteric tube, and right central line remain in place. Persistent low lung volumes. Ill-defined bilateral lung opacities, left greater than right, with slight progression from prior. Worsening retrocardiac and right basilar opacity with more confluent density. No large pleural effusion. No pneumothorax. IMPRESSION: Worsening bilateral perihilar lung opacities, left greater than right. This may be pulmonary edema, ARDS, or multifocal pneumonia. More confluent opacities at the bases is also progressed. Electronically Signed   By: Keith Rake M.D.   On: 12/24/2018 03:58   Dg Chest Port 1 View  Result Date: 12/23/2018 CLINICAL DATA:  58 year old male with cerebral infarcts, sepsis, bacteremia, respiratory failure. EXAM: PORTABLE CHEST 1 VIEW COMPARISON:  12/22/2018 and earlier. FINDINGS: Portable AP semi upright view at 0649 hours. Stable endotracheal tube tip between the level the clavicles and carina. Enteric tube courses to the abdomen, tip not included. Stable right IJ central line. Mildly larger lung volumes and regressed retrocardiac opacity with air bronchograms. The left hemidiaphragm is visible today. Mildly asymmetric increased left lung interstitial opacity is stable. No pneumothorax or new  pulmonary opacity. Normal cardiac size and mediastinal contours. Negative visible bowel gas pattern. IMPRESSION: 1. Stable lines and tubes. 2. Mildly larger lung volumes and regressed left lower lobe collapse or consolidation. 3. Asymmetric interstitial opacity in the left lung persists, favor infection over asymmetric edema. 4. No new cardiopulmonary abnormality. Electronically Signed   By: Genevie Ann M.D.   On: 12/23/2018 08:38   Dg Chest Port 1 View  Result Date: 12/22/2018 CLINICAL DATA:  58 year old male with cerebral infarcts, sepsis, staph bacteremia, respiratory failure. EXAM: PORTABLE CHEST 1 VIEW COMPARISON:  12/21/2018 and earlier. FINDINGS: Portable AP semi upright view at 0528 hours. ET tube in good position between the clavicles and carina. Enteric tube courses to the abdomen, side hole is at the level of the GE junction. Stable right IJ central line. Left lower lobe collapse or consolidation with some air bronchograms. Additional patchy left perihilar and medial right lung base opacity. No pneumothorax or pulmonary edema. No definite pleural effusion. No acute osseous abnormality identified. IMPRESSION: 1. Enteric tube side hole at the level of the GE junction, advance 5 cm to ensure side hole placement within the stomach. 2.  Otherwise stable lines and tubes. 3. Stable ventilation with left lower lobe collapse or consolidation and patchy additional perihilar opacity. Electronically Signed   By: Genevie Ann M.D.   On: 12/22/2018 07:23   Dg Chest Port 1 View  Result Date: 12/21/2018 CLINICAL DATA:  Central line placement. EXAM: PORTABLE CHEST 1 VIEW COMPARISON:  01/06/2019 FINDINGS: Endotracheal tube with tip 3.1 cm above the carina. Interval advancement of the nasogastric tube which has tip just below the expected region of the gastroesophageal junction as this could be advanced another 5 cm. Right IJ central venous catheter has tip over the SVC. Lungs are adequately inflated demonstrate hazy  opacification over the left lung which may be due to layering effusion with atelectasis. Subtle hazy opacification of the perihilar regions which may be due to mild interstitial edema. Cardiomediastinal silhouette and  remainder of the exam is unchanged. IMPRESSION: Hazy opacification over the left lung which may be due to layering effusion with atelectasis. Mild hazy opacification of the perihilar regions which suggest mild interstitial edema. Tubes and lines as described. Electronically Signed   By: Marin Olp M.D.   On: 12/21/2018 11:39   Dg Chest Portable 1 View  Result Date: 12/13/2018 CLINICAL DATA:  Acute LEFT CVA. EXAM: PORTABLE CHEST 1 VIEW COMPARISON:  03/04/2016 radiographs FINDINGS: An endotracheal tube with tip 4 cm above the carina and OG tube with tip overlying the distal esophagus noted. UPPER limits normal heart size noted. There is no evidence of focal airspace disease, pulmonary edema, suspicious pulmonary nodule/mass, pleural effusion, or pneumothorax. No acute bony abnormalities are identified. IMPRESSION: Endotracheal tube with tip 4 cm above the carina. OG tube with tip overlying the distal esophagus-recommend advancement. No acute cardiopulmonary disease. Electronically Signed   By: Margarette Canada M.D.   On: 12/30/2018 14:45   Dg Abd Portable 1 View  Result Date: 12/13/2018 CLINICAL DATA:  OG tube placement. EXAM: PORTABLE ABDOMEN - 1 VIEW COMPARISON:  None. FINDINGS: An OG tube is identified with tip overlying the distal esophagus. Recommend advancement. The bowel gas pattern is unremarkable. IMPRESSION: OG tube with tip overlying the distal esophagus-recommend advancement. Electronically Signed   By: Margarette Canada M.D.   On: 12/24/2018 14:46   Ct Head Code Stroke Wo Contrast  Result Date: 12/19/2018 CLINICAL DATA:  Code stroke. Left-sided weakness, facial droop, and slurred speech. EXAM: CT HEAD WITHOUT CONTRAST TECHNIQUE: Contiguous axial images were obtained from the base of the  skull through the vertex without intravenous contrast. COMPARISON:  Head CT 09/17/2013 FINDINGS: Brain: There is no evidence of acute infarct, intracranial hemorrhage, mass, midline shift, or extra-axial fluid collection. Patchy cerebral white matter hypodensities are similar to the prior CT and nonspecific but compatible with moderately age advanced chronic small vessel ischemic disease. Mild prominence of the lateral ventricles is also unchanged and may reflect central predominant cerebral atrophy. Vascular: Calcified atherosclerosis at the skull base. No hyperdense vessel. Skull: No fracture or focal osseous lesion. Sinuses/Orbits: Mild mucosal thickening in the maxillary sinuses. Clear mastoid air cells. Left cataract extraction. Other: None. ASPECTS [facility] Stroke Program Early CT Score) - Ganglionic level infarction (caudate, lentiform nuclei, internal capsule, insula, M1-M3 cortex): 7 - Supraganglionic infarction (M4-M6 cortex): 3 Total score (0-10 with 10 being normal): 10 IMPRESSION: 1. No evidence of acute intracranial abnormality. 2. ASPECTS is 10. 3. Moderate chronic small vessel ischemic disease. These results were communicated to Dr. Cheral Marker at 12:09 pm on 12/13/2018 by text page via the [facility] messaging system. Electronically Signed   By: Logan Bores M.D.   On: 12/19/2018 12:37   Ir Angio Intra Extracran Sel Com Carotid Innominate Bilat Mod Sed  Result Date: 12/24/2018 CLINICAL DATA:  Left-sided facial droop, severe dysarthria, and left upper weakness. CT angiogram revealing occlusion of the right middle cerebral artery questionable M2 division. EXAM: IR ANGIO VERTEBRAL SEL SUBCLAVIAN INNOMINATE UNI RIGHT MOD SED; IR ANGIO VERTEBRAL SEL VERTEBRAL UNI LEFT MOD SED; BILATERAL COMMON CAROTID AND INNOMINATE ANGIOGRAPHY COMPARISON:  CT angiogram of the head and neck of 12/11/2018. MEDICATIONS: Heparin none units IV; Ancef 2 g IV antibiotic was administered within 1 hour of the procedure.  ANESTHESIA/SEDATION: General anesthesia. CONTRAST:  Isovue 300 approximately 50 mL. FLUOROSCOPY TIME:  Fluoroscopy Time: 5 minutes 0 seconds (712 mGy). COMPLICATIONS: None immediate. TECHNIQUE: Informed written consent was obtained from the patient's mother  after a thorough discussion of the procedural risks, benefits and alternatives. All questions were addressed. Maximal Sterile Barrier Technique was utilized including caps, mask, sterile gowns, sterile gloves, sterile drape, hand hygiene and skin antiseptic. A timeout was performed prior to the initiation of the procedure. The right groin was prepped and draped in the usual sterile fashion. Thereafter using modified Seldinger technique, transfemoral access into the right common femoral artery was obtained without difficulty. Over a 0.035 inch guidewire, a 5 French Pinnacle sheath was inserted. Through this, and also over 0.035 inch guidewire, a 5 [place] JB 1 catheter was advanced to the aortic arch region and selectively positioned in the right common carotid artery, , the right subclavian artery, the left common carotid artery and the left vertebral artery. FINDINGS: The right subclavian arteriogram demonstrates hypoplastic right vertebral artery with a normal origin. The vessel is seen to opacify to the cranial skull base where it opacifies the right vertebrobasilar junction and the right posterior-inferior cerebellar artery. The opacified portion of the basilar artery is widely patent. The right common carotid arteriogram demonstrates the right external carotid artery and its major branches to be widely patent. The right internal carotid artery at the bulb to the cranial skull base demonstrates wide patency. The petrous, cavernous and the supraclinoid segments are widely patent. The right middle cerebral artery and the right anterior cerebral artery opacify into the capillary and venous phases. There is a tapered severe stenosis followed by occlusion of a mid  perisylvian branch of the superior division of the right middle cerebral artery in the M3 region. Extensive collaterals are seen retrogradely opacifying this distribution. Dural venous sinuses are widely patent. The left common carotid arteriogram demonstrates the left external carotid artery and its major branches to be widely patent. The left internal carotid artery at the bulb to the cranial skull base demonstrates wide patency. The petrous, cavernous and the supraclinoid segments are widely patent. The left middle cerebral artery demonstrates an early bifurcation a developmental variation, the left anterior cerebral artery opacifies with the left middle cerebral artery into the capillary and the venous phases. The left vertebral artery origin is widely patent. The vessel opacifies to the cranial skull base. Wide patency is seen of the left vertebrobasilar junction and the left posterior-inferior cerebellar artery which is hypoplastic. Suggestion of a left anterior-inferior cerebellar artery/posterior-inferior cerebellar artery complex is seen. The opacified portion of the basilar artery, the posterior cerebral arteries, the superior cerebellar arteries and the anterior-inferior cerebellar arteries is noted into the capillary and venous phases. Unopacified blood is seen in the basilar artery from the contralateral vertebral artery. 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 PLAN: Follow-up with the referring neurologist. Electronically Signed   By: Luanne Bras M.D.   On: 12/21/2018 13:24   Ir Angio Vertebral Sel Subclavian Innominate Uni R Mod Sed  Result Date: 12/24/2018 CLINICAL DATA:  Left-sided facial droop, severe dysarthria, and left upper weakness. CT angiogram revealing occlusion of the right middle cerebral artery questionable M2 division. EXAM: IR ANGIO VERTEBRAL SEL  SUBCLAVIAN INNOMINATE UNI RIGHT MOD SED; IR ANGIO VERTEBRAL SEL VERTEBRAL UNI LEFT MOD SED; BILATERAL COMMON CAROTID AND INNOMINATE ANGIOGRAPHY COMPARISON:  CT angiogram of the head and neck of 01/01/2019. MEDICATIONS: Heparin none units IV; Ancef 2 g IV antibiotic was administered within 1 hour of the procedure. ANESTHESIA/SEDATION: General anesthesia. CONTRAST:  Isovue 300 approximately 50 mL. FLUOROSCOPY  TIME:  Fluoroscopy Time: 5 minutes 0 seconds (712 mGy). COMPLICATIONS: None immediate. TECHNIQUE: Informed written consent was obtained from the patient's mother after a thorough discussion of the procedural risks, benefits and alternatives. All questions were addressed. Maximal Sterile Barrier Technique was utilized including caps, mask, sterile gowns, sterile gloves, sterile drape, hand hygiene and skin antiseptic. A timeout was performed prior to the initiation of the procedure. The right groin was prepped and draped in the usual sterile fashion. Thereafter using modified Seldinger technique, transfemoral access into the right common femoral artery was obtained without difficulty. Over a 0.035 inch guidewire, a 5 French Pinnacle sheath was inserted. Through this, and also over 0.035 inch guidewire, a 5 [place] JB 1 catheter was advanced to the aortic arch region and selectively positioned in the right common carotid artery, , the right subclavian artery, the left common carotid artery and the left vertebral artery. FINDINGS: The right subclavian arteriogram demonstrates hypoplastic right vertebral artery with a normal origin. The vessel is seen to opacify to the cranial skull base where it opacifies the right vertebrobasilar junction and the right posterior-inferior cerebellar artery. The opacified portion of the basilar artery is widely patent. The right common carotid arteriogram demonstrates the right external carotid artery and its major branches to be widely patent. The right internal carotid artery at the  bulb to the cranial skull base demonstrates wide patency. The petrous, cavernous and the supraclinoid segments are widely patent. The right middle cerebral artery and the right anterior cerebral artery opacify into the capillary and venous phases. There is a tapered severe stenosis followed by occlusion of a mid perisylvian branch of the superior division of the right middle cerebral artery in the M3 region. Extensive collaterals are seen retrogradely opacifying this distribution. Dural venous sinuses are widely patent. The left common carotid arteriogram demonstrates the left external carotid artery and its major branches to be widely patent. The left internal carotid artery at the bulb to the cranial skull base demonstrates wide patency. The petrous, cavernous and the supraclinoid segments are widely patent. The left middle cerebral artery demonstrates an early bifurcation a developmental variation, the left anterior cerebral artery opacifies with the left middle cerebral artery into the capillary and the venous phases. The left vertebral artery origin is widely patent. The vessel opacifies to the cranial skull base. Wide patency is seen of the left vertebrobasilar junction and the left posterior-inferior cerebellar artery which is hypoplastic. Suggestion of a left anterior-inferior cerebellar artery/posterior-inferior cerebellar artery complex is seen. The opacified portion of the basilar artery, the posterior cerebral arteries, the superior cerebellar arteries and the anterior-inferior cerebellar arteries is noted into the capillary and venous phases. Unopacified blood is seen in the basilar artery from the contralateral vertebral artery. 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 PLAN: Follow-up with the referring neurologist. Electronically Signed   By: Luanne Bras M.D.   On: 12/21/2018  13:24   Ir Angio Vertebral Sel Vertebral Uni L Mod Sed  Result Date: 12/24/2018 CLINICAL DATA:  Left-sided facial droop, severe dysarthria, and left upper weakness. CT angiogram revealing occlusion of the right middle cerebral artery questionable M2 division. EXAM: IR ANGIO VERTEBRAL SEL SUBCLAVIAN INNOMINATE UNI RIGHT MOD SED; IR ANGIO VERTEBRAL SEL VERTEBRAL UNI LEFT MOD SED; BILATERAL COMMON CAROTID AND INNOMINATE ANGIOGRAPHY COMPARISON:  CT angiogram of the head and neck of 12/30/2018. MEDICATIONS: Heparin none units IV; Ancef 2  g IV antibiotic was administered within 1 hour of the procedure. ANESTHESIA/SEDATION: General anesthesia. CONTRAST:  Isovue 300 approximately 50 mL. FLUOROSCOPY TIME:  Fluoroscopy Time: 5 minutes 0 seconds (712 mGy). COMPLICATIONS: None immediate. TECHNIQUE: Informed written consent was obtained from the patient's mother after a thorough discussion of the procedural risks, benefits and alternatives. All questions were addressed. Maximal Sterile Barrier Technique was utilized including caps, mask, sterile gowns, sterile gloves, sterile drape, hand hygiene and skin antiseptic. A timeout was performed prior to the initiation of the procedure. The right groin was prepped and draped in the usual sterile fashion. Thereafter using modified Seldinger technique, transfemoral access into the right common femoral artery was obtained without difficulty. Over a 0.035 inch guidewire, a 5 French Pinnacle sheath was inserted. Through this, and also over 0.035 inch guidewire, a 5 [place] JB 1 catheter was advanced to the aortic arch region and selectively positioned in the right common carotid artery, , the right subclavian artery, the left common carotid artery and the left vertebral artery. FINDINGS: The right subclavian arteriogram demonstrates hypoplastic right vertebral artery with a normal origin. The vessel is seen to opacify to the cranial skull base where it opacifies the right  vertebrobasilar junction and the right posterior-inferior cerebellar artery. The opacified portion of the basilar artery is widely patent. The right common carotid arteriogram demonstrates the right external carotid artery and its major branches to be widely patent. The right internal carotid artery at the bulb to the cranial skull base demonstrates wide patency. The petrous, cavernous and the supraclinoid segments are widely patent. The right middle cerebral artery and the right anterior cerebral artery opacify into the capillary and venous phases. There is a tapered severe stenosis followed by occlusion of a mid perisylvian branch of the superior division of the right middle cerebral artery in the M3 region. Extensive collaterals are seen retrogradely opacifying this distribution. Dural venous sinuses are widely patent. The left common carotid arteriogram demonstrates the left external carotid artery and its major branches to be widely patent. The left internal carotid artery at the bulb to the cranial skull base demonstrates wide patency. The petrous, cavernous and the supraclinoid segments are widely patent. The left middle cerebral artery demonstrates an early bifurcation a developmental variation, the left anterior cerebral artery opacifies with the left middle cerebral artery into the capillary and the venous phases. The left vertebral artery origin is widely patent. The vessel opacifies to the cranial skull base. Wide patency is seen of the left vertebrobasilar junction and the left posterior-inferior cerebellar artery which is hypoplastic. Suggestion of a left anterior-inferior cerebellar artery/posterior-inferior cerebellar artery complex is seen. The opacified portion of the basilar artery, the posterior cerebral arteries, the superior cerebellar arteries and the anterior-inferior cerebellar arteries is noted into the capillary and venous phases. Unopacified blood is seen in the basilar artery from the  contralateral vertebral artery. 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 PLAN: Follow-up with the referring neurologist. Electronically Signed   By: Luanne Bras M.D.   On: 12/21/2018 13:24    Microbiology Recent Results (from the past 240 hour(s))  Urine Culture     Status: None   Collection Time: 12/21/18 11:04 PM  Result Value Ref Range Status   Specimen Description URINE, CATHETERIZED  Final   Special Requests NONE  Final   Culture   Final    NO GROWTH Performed at [facility]  [facility], [place] [address] [place], [place] [address]    Report Status 12/22/2018 FINAL  Final  Culture, blood (routine x 2)     Status: None   Collection Time: 12/23/18  6:14 AM  Result Value Ref Range Status   Specimen Description BLOOD RIGHT ARM  Final   Special Requests   Final    BOTTLES DRAWN AEROBIC ONLY Blood Culture adequate volume   Culture   Final    NO GROWTH 5 DAYS Performed at [facility], [place] [address], [place], [place] [address]    Report Status 12/28/2018 FINAL  Final  Culture, blood (routine x 2)     Status: None   Collection Time: 12/23/18  6:50 AM  Result Value Ref Range Status   Specimen Description BLOOD RIGHT ARM  Final   Special Requests   Final    BOTTLES DRAWN AEROBIC ONLY Blood Culture adequate volume   Culture   Final    NO GROWTH 5 DAYS Performed at [place], [place] [address], [place], [place] [address]    Report Status 12/28/2018 FINAL  Final  Culture, blood (Routine X 2) w Reflex to ID Panel     Status: None (Preliminary result)   Collection Time: 12/28/18 10:15 PM  Result Value Ref Range Status   Specimen Description BLOOD RIGHT HAND  Final   Special Requests   Final    BOTTLES DRAWN AEROBIC ONLY Blood Culture adequate volume   Culture   Final    NO GROWTH 3 DAYS Performed at [facility], [place] [address],  [place], [place] [address]    Report Status PENDING  Incomplete  Culture, blood (Routine X 2) w Reflex to ID Panel     Status: None (Preliminary result)   Collection Time: 12/28/18 10:20 PM  Result Value Ref Range Status   Specimen Description BLOOD RIGHT HAND  Final   Special Requests   Final    BOTTLES DRAWN AEROBIC ONLY Blood Culture adequate volume   Culture   Final    NO GROWTH 3 DAYS Performed at [facility], [place] [address] [place], [place] [address]    Report Status PENDING  Incomplete  Culture, respiratory (non-expectorated)     Status: None   Collection Time: 12/29/18 10:39 AM  Result Value Ref Range Status   Specimen Description TRACHEAL ASPIRATE  Final   Special Requests NONE  Final   Gram Stain   Final    RARE WBC PRESENT, PREDOMINANTLY PMN ABUNDANT GRAM NEGATIVE RODS ABUNDANT GRAM POSITIVE COCCI IN PAIRS IN CHAINS RARE YEAST RARE GRAM POSITIVE RODS    Culture   Final    MODERATE Consistent with normal respiratory flora. Performed at [facility], [place] [address], [place], [place] [address]    Report Status Jan 01, 2019 FINAL  Final  C difficile quick scan w PCR reflex     Status: None   Collection Time: 12/29/18 10:39 AM  Result Value Ref Range Status   C Diff antigen NEGATIVE NEGATIVE Final   C Diff toxin NEGATIVE NEGATIVE Final   C Diff interpretation No C. difficile detected.  Final    Comment: Performed at [facility], [place] [address] [place], [place] [address]    Lab Basic Metabolic Panel: Recent Labs  Lab 12/27/18 0500  12/28/18 0616  12/29/18 0500 12/29/18 1329 12/30/18 0319 12/30/18 1524 01-01-19 0437  NA 138   < > 137   < >  138 139 138 138 138  K 4.4   < > 4.3   < > 4.4 5.0 4.7 4.9 4.1  CL 104   < > 104   < > 102 105 103 104 104  CO2 22   < > 23   < > 25 23 22 22 24   GLUCOSE 130*   < > 121*   < > 142* 122* 135* 132* 148*  BUN 87*   < > 55*   < > 43* 41* 43* 44* 45*  CREATININE 4.18*   < > 2.82*   < > 2.15* 2.22* 2.30* 2.19*  1.98*  CALCIUM 8.8*   < > 8.7*   < > 8.8* 9.0 8.7* 8.8* 9.3  MG 2.5*  --  2.6*  --  2.7*  --  2.5*  --  2.7*  PHOS 4.4   < > 4.0   < > 3.6 3.7 4.3 4.5 3.9   < > = values in this interval not displayed.   Liver Function Tests: Recent Labs  Lab 12/29/18 0500 12/29/18 1329 12/30/18 0319 12/30/18 1524 13-Jan-2019 0437  AST 31  --   --   --   --   ALT 9  --   --   --   --   ALKPHOS 114  --   --   --   --   BILITOT 2.3*  --   --   --   --   PROT 6.7  --   --   --   --   ALBUMIN 1.7*  1.6* 1.6* 1.6* 1.6* 1.7*   No results for input(s): LIPASE, AMYLASE in the last 168 hours. No results for input(s): AMMONIA in the last 168 hours. CBC: Recent Labs  Lab 12/27/18 1108 12/27/18 1715 12/28/18 0616 12/29/18 0500 12/29/18 1329 12/30/18 0319  WBC 29.9*  --  48.5* 46.1* 45.8* 45.2*  HGB 6.8* 8.0* 8.1* 7.1* 7.0* 6.5*  HCT 20.0* 24.3* 24.3* 21.9* 21.6* 20.4*  MCV 89.7  --  92.4 95.6 95.6 96.2  PLT 135*  --  144* 147* 147* 151   Cardiac Enzymes: No results for input(s): CKTOTAL, CKMB, CKMBINDEX, TROPONINI in the last 168 hours. Sepsis Labs: Recent Labs  Lab 12/28/18 0616 12/29/18 0500 12/29/18 1329 12/30/18 0319  WBC 48.5* 46.1* 45.8* 45.2*    Procedures/Operations  Cerebral angiogram HD catheter insertion  Dametra Whetsel 2019-01-13, 5:29 PM

## 2019-01-11 NOTE — Progress Notes (Signed)
Wasted 41ml of morphine in sink w/ Phong, Therapist, sports. Lily, NT walked unused 170ml morphine bag down to pharmacy.

## 2019-01-11 NOTE — Progress Notes (Signed)
Subjective:  No new issues.   Objective Vital signs in last 24 hours: Vitals:   01/24/2019 0700 2019-01-24 0730 01/24/2019 0754 01-24-19 0800  BP: 108/85 101/85    Pulse:  (!) 113    Resp: 14 12    Temp:    (!) 97.4 F (36.3 C)  TempSrc:    Axillary  SpO2: 100% 100% 100%   Weight:      Height:       Weight change:   Intake/Output Summary (Last 24 hours) at 24-Jan-2019 0934 Last data filed at 2019-01-24 0700 Gross per 24 hour  Intake 5149.11 ml  Output 5178 ml  Net -28.89 ml   [facility]  4h  450  88kg  2/2 bath  Hep yes   L AVF parsabiv 5, calcitriol 1.5 Last hgb 10.9- calc 9.0- phos 6.9- pth 594   Summary: Pt is a 58 y.o. yo male who was admitted on 01/03/2019 with an acute ischemic stroke , then found to have an MSSA bacteremia    Assessment/Plan: 1. Acute CVA- bilat due to septic emboli 2. MSSA bacteremia- IV abx per ID 3. ESRD- CRRT due to sepsis / shock 5. HTN/volume- +7 kg from dry wt 6. Anemia- transfusing prn 7. VDRF- diffuse bilat infiltrates on CXR, unable to pull fluid due to shock 8. EOL - plan is for withdrawal of life support later today  [facility] 2019/01/24, 9:34 AM    Labs: Basic Metabolic Panel: Recent Labs  Lab 12/30/18 0319 12/30/18 1524 January 24, 2019 0437  NA 138 138 138  K 4.7 4.9 4.1  CL 103 104 104  CO2 '22 22 24  ' GLUCOSE 135* 132* 148*  BUN 43* 44* 45*  CREATININE 2.30* 2.19* 1.98*  CALCIUM 8.7* 8.8* 9.3  PHOS 4.3 4.5 3.9   Liver Function Tests: Recent Labs  Lab 12/29/18 0500  12/30/18 0319 12/30/18 1524 24-Jan-2019 0437  AST 31  --   --   --   --   ALT 9  --   --   --   --   ALKPHOS 114  --   --   --   --   BILITOT 2.3*  --   --   --   --   PROT 6.7  --   --   --   --   ALBUMIN 1.7*  1.6*   < > 1.6* 1.6* 1.7*   < > = values in this interval not displayed.   No results for input(s): LIPASE, AMYLASE in the last 168 hours. No results for input(s): AMMONIA in the last 168 hours. CBC: Recent Labs  Lab  12/27/18 1108  12/28/18 0616 12/29/18 0500 12/29/18 1329 12/30/18 0319  WBC 29.9*  --  48.5* 46.1* 45.8* 45.2*  HGB 6.8*   < > 8.1* 7.1* 7.0* 6.5*  HCT 20.0*   < > 24.3* 21.9* 21.6* 20.4*  MCV 89.7  --  92.4 95.6 95.6 96.2  PLT 135*  --  144* 147* 147* 151   < > = values in this interval not displayed.   Cardiac Enzymes: No results for input(s): CKTOTAL, CKMB, CKMBINDEX, TROPONINI in the last 168 hours. CBG: Recent Labs  Lab 12/30/18 1541 12/30/18 1949 12/30/18 2332 01/24/19 0345 24-Jan-2019 0743  GLUCAP 111* 105* 124* 129* 133*    Iron Studies: No results for input(s): IRON, TIBC, TRANSFERRIN, FERRITIN in the last 72 hours. Studies/Results: Dg Chest Port 1 View  Result Date: 12/30/2018 CLINICAL DATA:  Endotracheal  tube EXAM: PORTABLE CHEST 1 VIEW COMPARISON:  Yesterday FINDINGS: Endotracheal tube tip between the clavicular heads and carina. The orogastric tube tip is at the proximal stomach. Bilateral central line in stable good position. Extensive bilateral airspace disease without improvement. Cardiomegaly. IMPRESSION: Stable hardware positioning and symmetric, extensive airspace disease. Electronically Signed   By: Monte Fantasia M.D.   On: 12/30/2018 10:14   Dg Chest Port 1 View  Result Date: 12/29/2018 CLINICAL DATA:  Tachypnea. EXAM: PORTABLE CHEST 1 VIEW COMPARISON:  12/28/2018 FINDINGS: The right-sided central venous catheter, left-sided central venous catheter, endotracheal tube, and enteric tube all appear similar. Diffuse hazy bilateral airspace opacities are again noted. The cardiac silhouette is increased in size but stable from prior study. No pneumothorax. Trace bilateral pleural effusions are suspected, however the left costophrenic angle is not fully visualized. IMPRESSION: Stable lines and tubes.  No significant interval change. Electronically Signed   By: Constance Holster M.D.   On: 12/29/2018 13:59   Medications: Infusions: . sodium chloride Stopped  (2019-01-26 0619)  . dexmedetomidine (PRECEDEX) IV infusion 2 mcg/kg/hr (Jan 26, 2019 0826)  . morphine 10 mg/hr (01/26/2019 0901)  . norepinephrine (LEVOPHED) Adult infusion 11 mcg/min (2019-01-26 0836)    Scheduled Medications: . amiodarone  200 mg Per Tube BID  . chlorhexidine gluconate (MEDLINE KIT)  15 mL Mouth Rinse BID  . Chlorhexidine Gluconate Cloth  6 each Topical Daily  . mouth rinse  15 mL Mouth Rinse 10 times per day  . pantoprazole sodium  40 mg Per Tube Q24H    have reviewed scheduled and prn medications.  Physical Exam: General: sedated on vent  Heart: tachy Lungs: mostly clear Abdomen: soft, non tender Extremities: really no edema Dialysis Access: left AVF patent- also with right IJ temp HD cath for CRRT  - placed 5/12-    01/26/2019,9:34 AM  LOS: 11 days

## 2019-01-11 NOTE — Progress Notes (Signed)
[facility], pt mother, to notify 2 pt belonging bags at bedside. [facility] will send pt ex-wife to pick up belongings from main entrance front desk.

## 2019-01-11 NOTE — Progress Notes (Signed)
Wasted 129ml fentanyl in sink w/ Phong, Therapist, sports.

## 2019-01-11 NOTE — Procedures (Signed)
Extubation Procedure Note  Patient Details:   Name: Thomas Adams DOB: 1960/11/21 MRN: [id number]   Airway Documentation:    Vent end date: January 11, 2019 Vent end time: 1445   Evaluation  O2 sats: monitor off, withdrawal of care Complications: No apparent complications Patient did tolerate procedure well. Bilateral Breath Sounds: Rhonchi   Withdrawal of care per MD order & family wishes  Kathie Dike 2019-01-11, 2:52 PM

## 2019-01-11 NOTE — Progress Notes (Signed)
Nutrition Brief Note  Chart reviewed. Pt now transitioning to comfort care.  No further nutrition interventions warranted at this time.  Please re-consult as needed.   Shelisa Fern RD, LDN, CNSC [phone] Pager [phone] After Hours Pager    

## 2019-01-11 NOTE — Progress Notes (Signed)
NAME:  Kin Galbraith, MRN:  [id number], DOB:  February 04, 1961, LOS: 27 ADMISSION DATE:  12/21/2018, CONSULTATION DATE:  Jan 12, 2019 REFERRING MD:  Dr. Germaine Pomfret , CHIEF COMPLAINT:  Left side weakness   Brief History   58 yr old with history of HTN, afib, febrile and coughing x 1 d, found with left facial droop, left sided weakness 5/10. 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 Brought to ICU intubated  Past Medical History  HTN, ESRD, dialysis TThSat, Afib w/ RVR  Significant Hospital Events   5/10 Angiogram in IR >> no clot retraction due to distal location of thrombus; hypotension from meds 5/11 d/c diprivan due to triglyceride levels 5/12 start CRRT; start amiodarone 5/14 Off pressors, on CRRT 5/17 difficult day yesterday, resume CRRT on pressors-being weaned 5/18 no significant changes overnight.  Seen by infectious disease for severe leukocytosis, repeat blood culture sent.  Of note chest x-ray actually looked worse.  Not clear if this representing new pneumonia or pulmonary edema. Seen by thoracic surg. Not candidate for surgery  5/19: Palliative care consulted  5/20: Remains pressor dependent.  Unresponsive.  Chest x-ray with what appears to be ARDS.  Spoke with mother, plan to transition to withdrawal of care and comfort on 5/21 5/21: Antibiotics stopped.  CRRT stopped, all labs and therapies that do not contribute to comfort have been discontinued with the exception of norepinephrine as we await family to arrive.  Once family is here we will stop norepinephrine.  Transitioning from fentanyl to morphine infusion Consults:  Nephrology  ID Cardiology   Procedures:  ETT 5/10 >>  Rt radial aline 5/10 >>  Rt IJ HD cath 5/11 >>  Left IJ triple-lumen catheter insertion 5/16  Significant Diagnostic Tests:  CT angio head/neck 5/10 >> Rt M2 occlusion, severe Lt MCA stenosis Echo 5/10 >> EF greater than 65%, severe LCH, severe  MR MRI brain 5/11 >> multifocal acute ischemia scattered in both cerebral and cerebellar hemispheres 5/17-chest x-ray revealed improvement in congestion/infiltrate Micro Data:  COVID 5/10 >> negative Blood 5/10 >> MSSA Blood culture 5/18 Antimicrobials:  Cefepime 5/10 >> 5/11 Vancomycin 5/10 >> 5/11 Ancef 5/11 >>5/13 Nafcillin 5/13 > cdiff 5/19>>> Respiratory culture 5/19>>>  Subjective:    Objective   Blood pressure 101/85, pulse (Abnormal) 113, temperature (Abnormal) 97.4 F (36.3 C), temperature source Axillary, resp. rate 12, height 5\' 9"  (1.753 m), weight 95.1 kg, SpO2 100 %. CVP:  [7 mmHg-25 mmHg] 12 mmHg  Vent Mode: PRVC FiO2 (%):  [60 %] 60 % Set Rate:  [18 bmp] 18 bmp Vt Set:  [560 mL-650 mL] 560 mL PEEP:  [10 cmH20] 10 cmH20 Plateau Pressure:  [20 cmH20] 20 cmH20   Intake/Output Summary (Last 24 hours) at 2019-01-12 6237 Last data filed at 01/12/2019 0700 Gross per 24 hour  Intake 5318.75 ml  Output 5408 ml  Net -89.25 ml   Filed Weights   12/25/18 0440 12/27/18 0500 01-12-2019 0500  Weight: 96.1 kg 96 kg 95.1 kg    Examination: General: This is a 58 year old male patient who is currently sedated on Precedex and fentanyl he is not following commands, had been agitated at times. HEENT normocephalic atraumatic some scleral edema is appreciated he is orally intubated Pulmonary: Diffuse coarse rhonchi, he is more synchronous on heavier sedation Cardiac: Rapid irregular irregular with atrial fibrillation on telemetry Abdomen: Soft not tender no organomegaly Extremities: Diffuse anasarca cool. 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  Suspect he likely has a superimposed pneumonia but after my discussion with his  mother we will not escalate CVA: Embolic, either from MSSA endocarditis or longstanding atrial fibrillation-Thrombectomy attempted by IR-unsuccessful Agitated delirium/acute metabolic encephalopathy Acute respiratory failure due to airway compromise, in the setting of CVA further complicated by bilateral pulmonary infiltrates felt mix of edema versus pneumonia and ARDS End-stage renal disease Anemia of critical illness, likely somewhat complicated by CRRT.  Thrombocytopenia: These are stable Atrial Fibrillation (chronic)  Discussion: This is an unfortunate male patient who has suffered a catastrophic embolic CVA in the setting of MSSA endocarditis.  He also has longstanding atrial fibrillation.  His course has been complicated by ongoing shock, profound metabolic derangements, ongoing encephalopathy, and severe debilitation as a consequence of his stroke.  Additionally he has had poorly explained but significant leukocytosis.  To date we have had no new culture data to explain this.  Had a long discussion with his mother in regards to the consequences of his acute illness superimposed on his chronic poor health.  She understands he is unlikely to survive this and if he were to survive he would have significant debilitation and would require lifelong assistance with all activities of daily living.  This is not consistent with the patient's values and because of this she has opted to proceed to transition for comfort focus  Plan Discontinue CRRT Discontinue antibiotics Start morphine drip Discontinue all labs Mother is planning on being here later this afternoon to say goodbye     Best practice:  Diet: Continue tube feeds DVT prophylaxis: SCDs, on heparin GI prophylaxis: Protonix Mobility: Bedrest Code Status: Full dnr  Disposition:  My cct 33 min Erick Colace ACNP-BC [facility] Pager # [phone] OR # [phone] if no answer

## 2019-01-11 NOTE — Progress Notes (Signed)
[facility], pt mother called RN back about pt belongings bag. [facility] states to discard of pt belongings in trash can.

## 2019-01-11 DEATH — deceased

## 2019-01-25 ENCOUNTER — Telehealth: Payer: Medicare Other | Admitting: Cardiology

## 2019-03-03 LAB — DIFFERENTIAL

## 2019-10-25 IMAGING — DX PORTABLE CHEST - 1 VIEW
1 series · 1 of 1 positions shown · non-contrast
Comparison: 12/21/2018 and earlier.

CLINICAL DATA: 57-year-old female with cerebral infarcts, sepsis,
staph bacteremia, respiratory failure.

EXAM:
PORTABLE CHEST 1 VIEW

[chest ap]
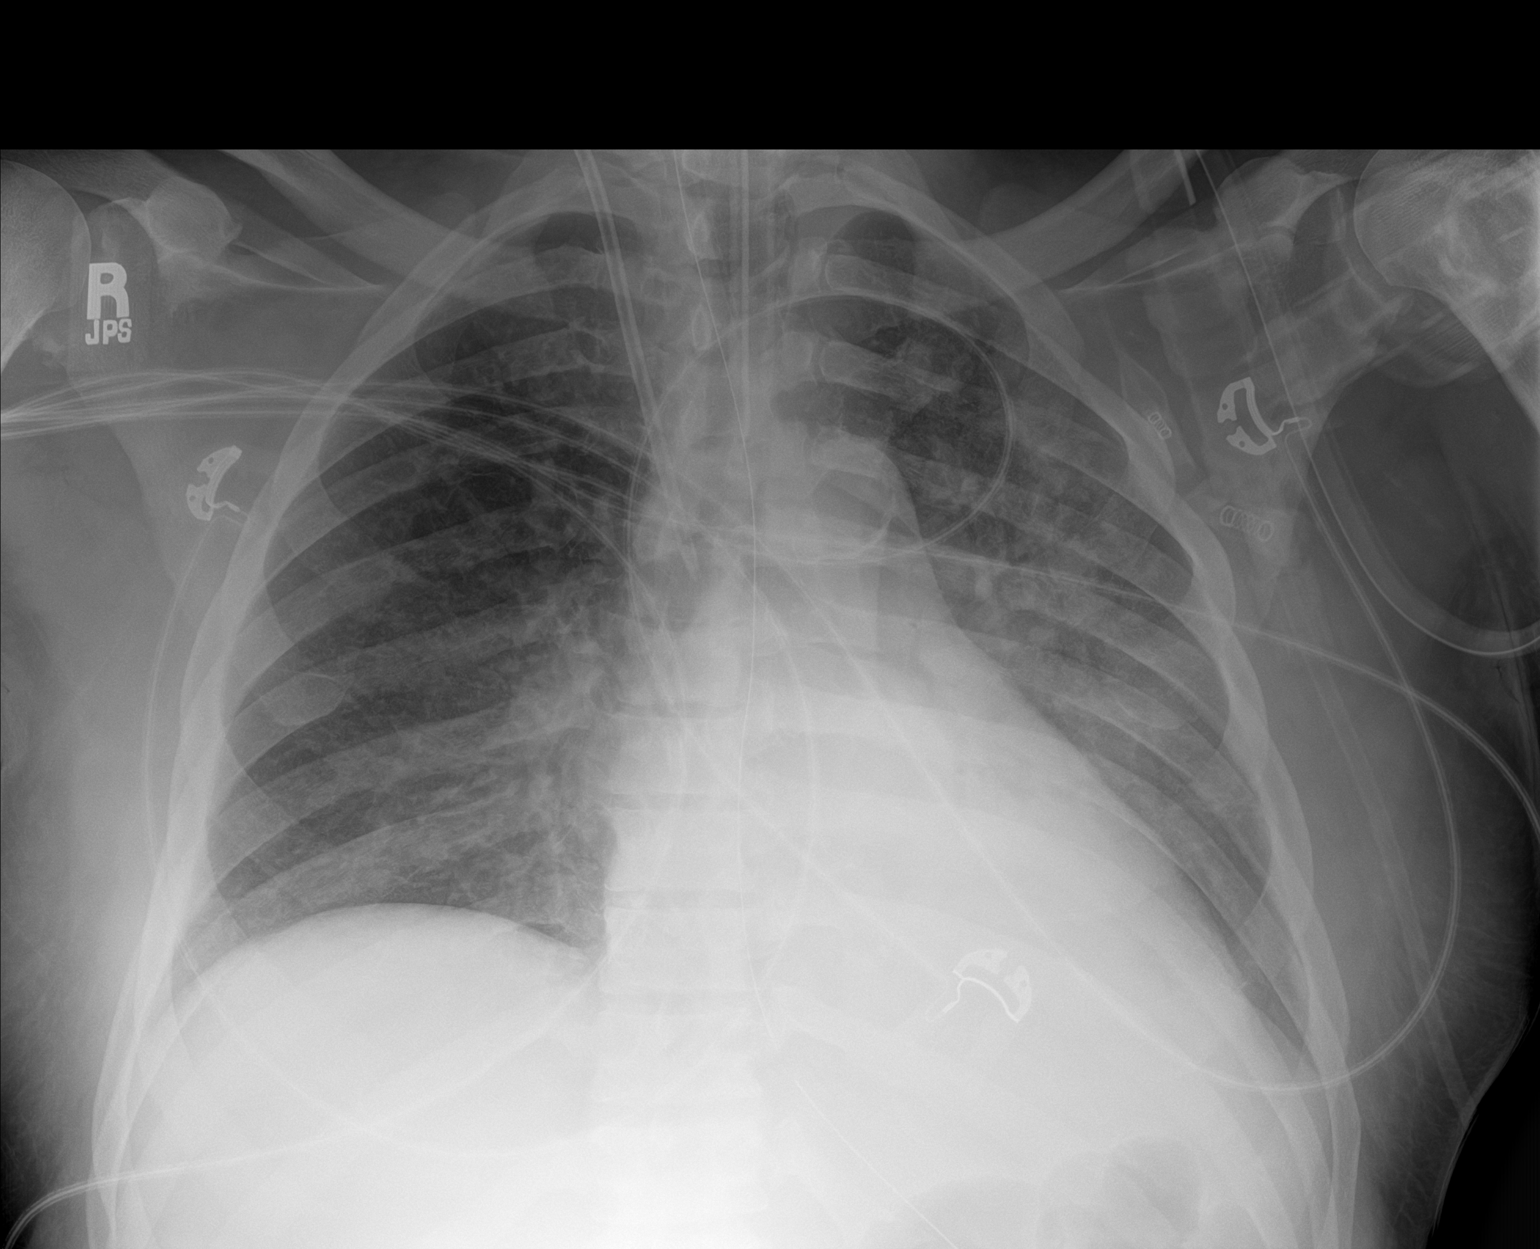

[1 of 1 positions shown; findings below may reference images not displayed]

FINDINGS: Portable AP semi upright view at 2878 hours. ET tube in good
position between the clavicles and carina. Enteric tube courses to
the abdomen, side hole is at the level of the GE junction. Stable
right IJ central line.

Left lower lobe collapse or consolidation with some air
bronchograms. Additional patchy left perihilar and medial right lung
base opacity. No pneumothorax or pulmonary edema. No definite
pleural effusion. No acute osseous abnormality identified.
IMPRESSION: 1. Enteric tube side hole at the level of the GE junction, advance 5
cm to ensure side hole placement within the stomach.
2.  Otherwise stable lines and tubes.
3. Stable ventilation with left lower lobe collapse or consolidation
and patchy additional perihilar opacity.

## 2019-10-27 IMAGING — DX PORTABLE CHEST - 1 VIEW
1 series · 1 of 1 positions shown · non-contrast
Comparison: Yesterday.

CLINICAL DATA: Acute respiratory failure.

EXAM:
PORTABLE CHEST 1 VIEW

[chest ap]
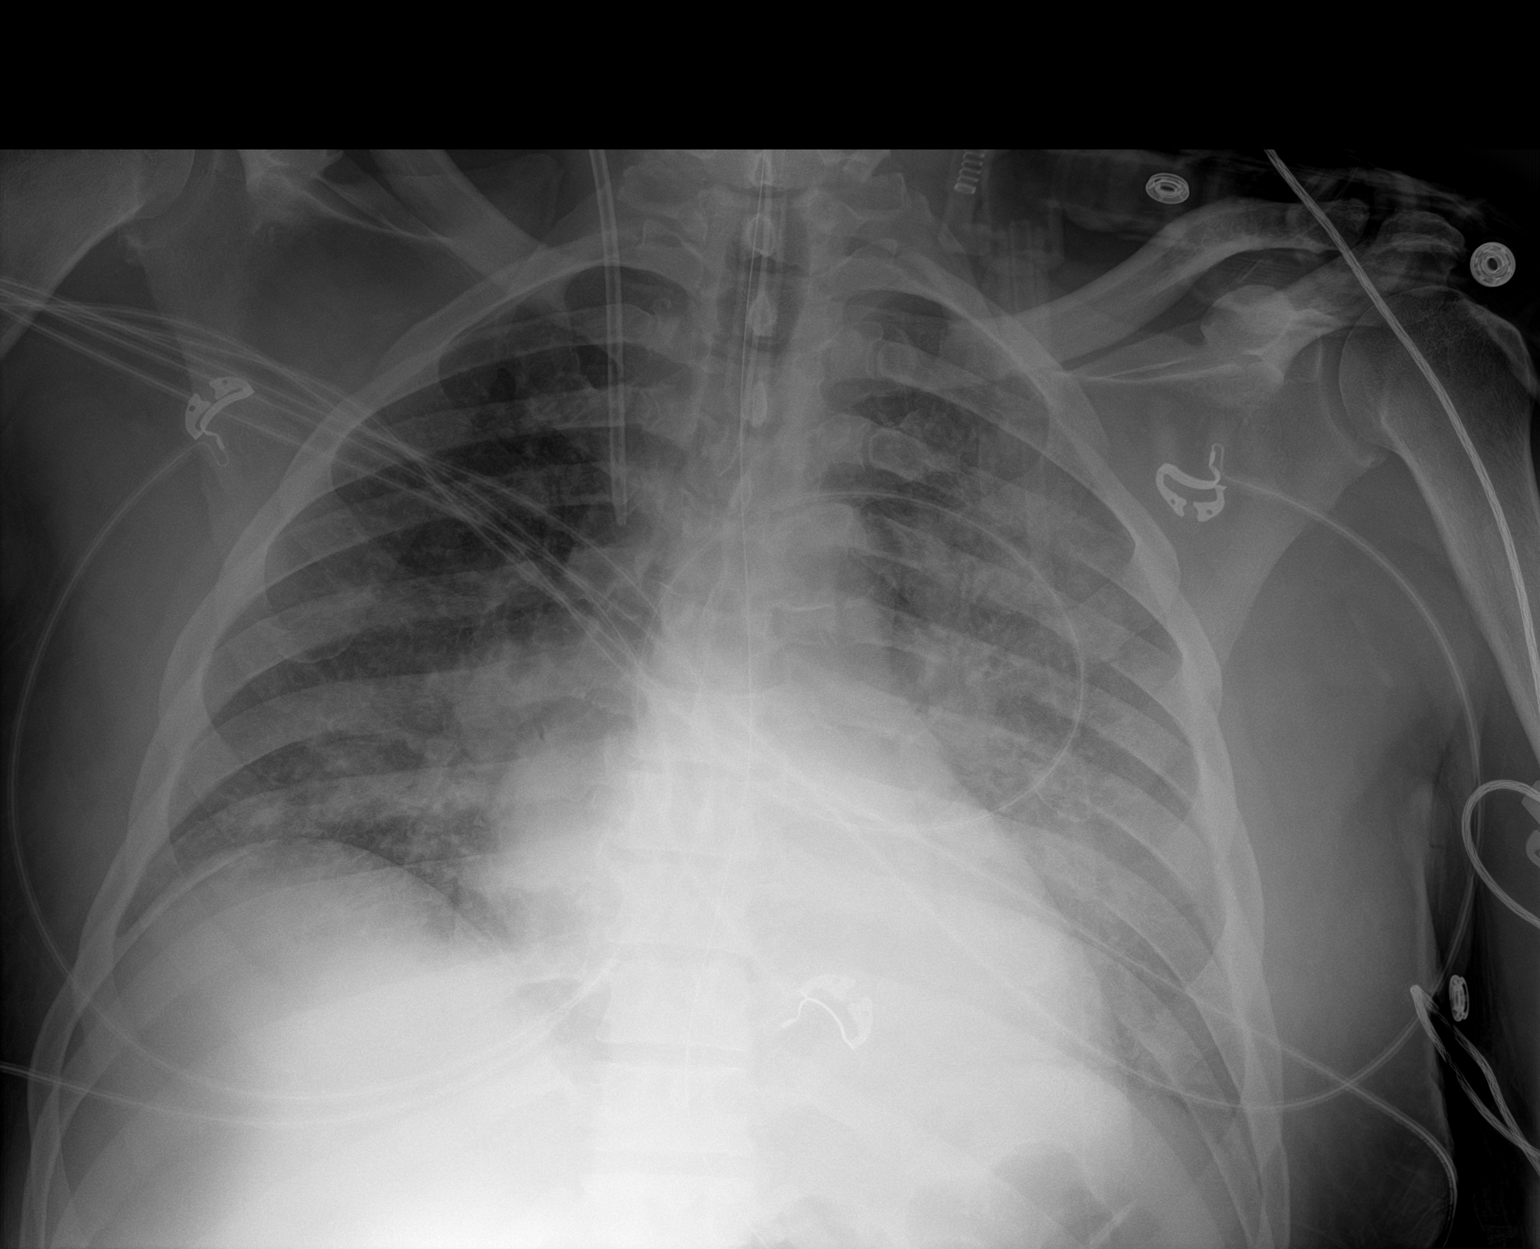

[1 of 1 positions shown; findings below may reference images not displayed]

FINDINGS: Endotracheal tube, enteric tube, and right central line remain in
place. Persistent low lung volumes. Ill-defined bilateral lung
opacities, left greater than right, with slight progression from
prior. Worsening retrocardiac and right basilar opacity with more
confluent density. No large pleural effusion. No pneumothorax.
IMPRESSION: Worsening bilateral perihilar lung opacities, left greater than
right. This may be pulmonary edema, ARDS, or multifocal pneumonia.
More confluent opacities at the bases is also progressed.

## 2019-10-30 IMAGING — CR PORTABLE CHEST - 1 VIEW
1 series · 1 of 1 positions shown · non-contrast
Comparison: One-view chest x-ray 12/26/2018

CLINICAL DATA: Intubated

EXAM:
PORTABLE CHEST 1 VIEW

[AP]
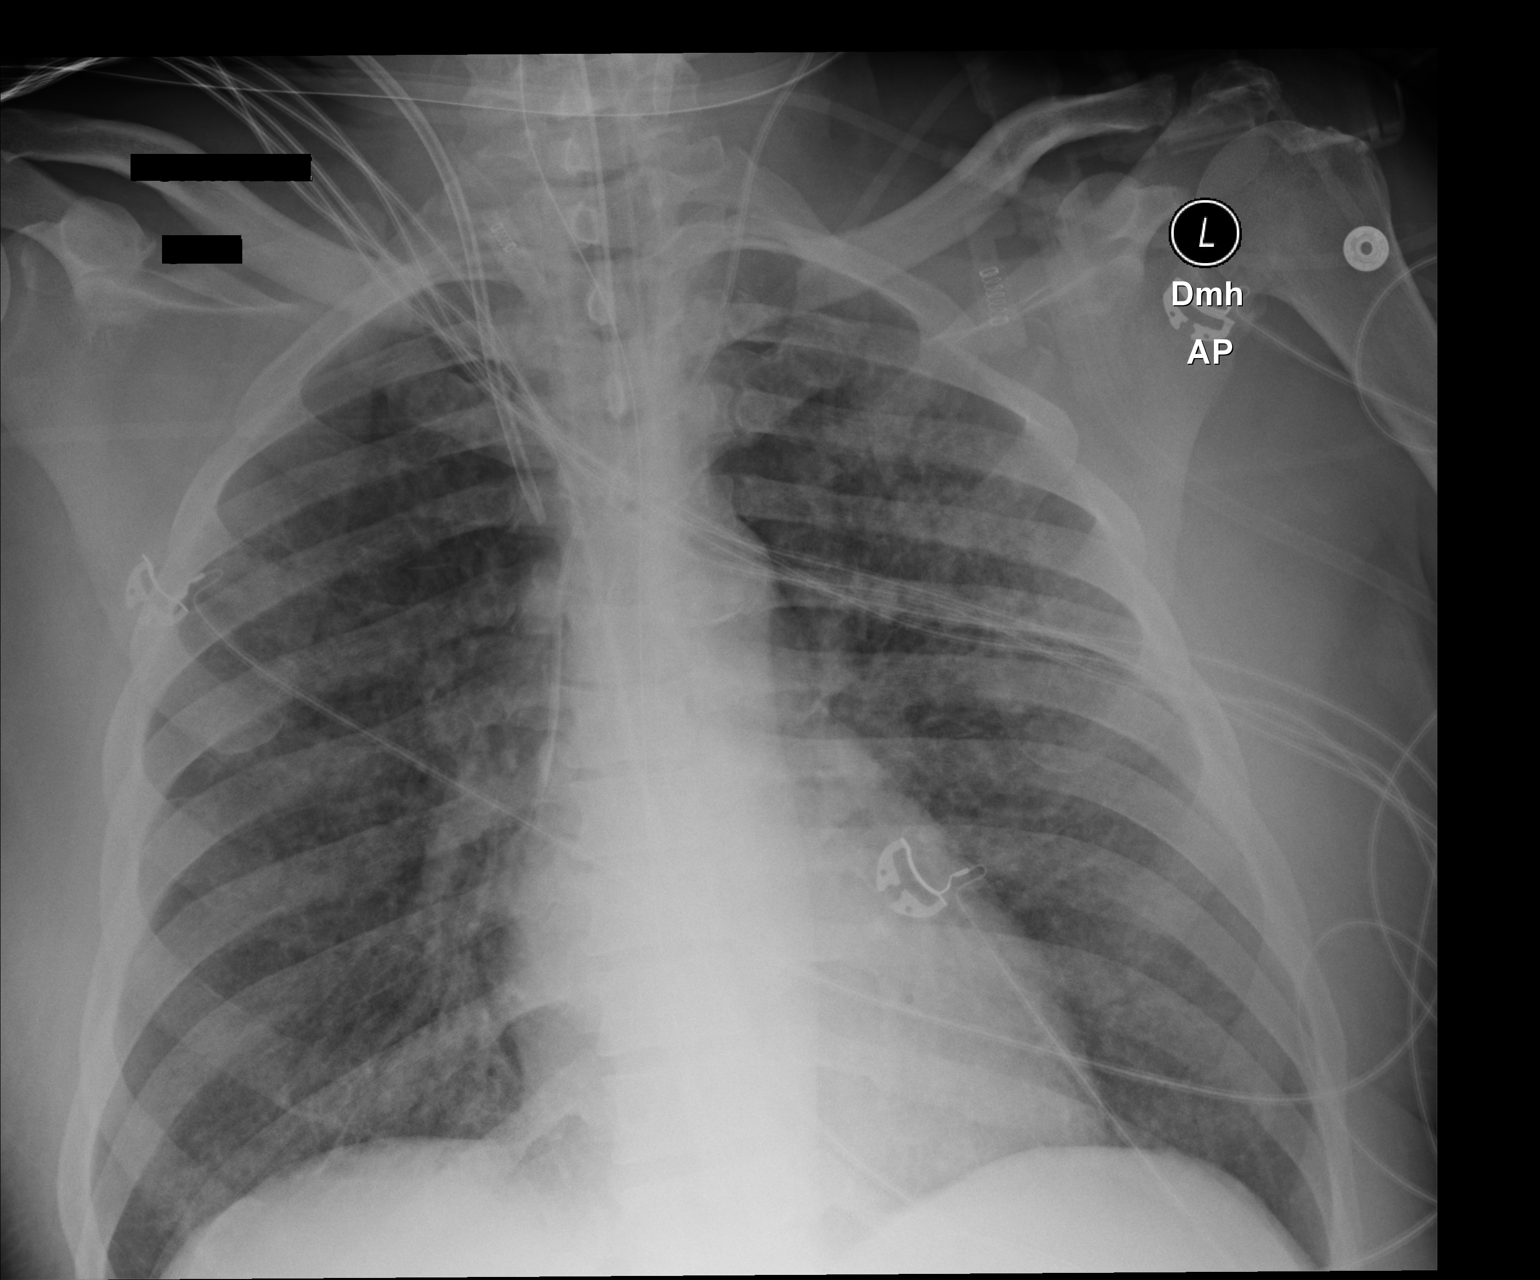

[1 of 1 positions shown; findings below may reference images not displayed]

FINDINGS: Heart size is normal. Endotracheal tube terminates 5.5 cm above the
carina, in satisfactory position. Left IJ line and right IJ sheath
are stable. NG tube courses off the inferior border of the film.

There is slight improved aeration. Patchy interstitial and airspace
disease is again noted bilaterally.
IMPRESSION: 1. Support apparatus stable.
2. Slight improved aeration of both lungs.
3. Persistent diffuse interstitial and airspace disease.

## 2019-11-01 IMAGING — CR PORTABLE CHEST - 1 VIEW
1 series · 1 of 1 positions shown · non-contrast
Comparison: 12/28/2018

CLINICAL DATA: Tachypnea.

EXAM:
PORTABLE CHEST 1 VIEW

[AP]
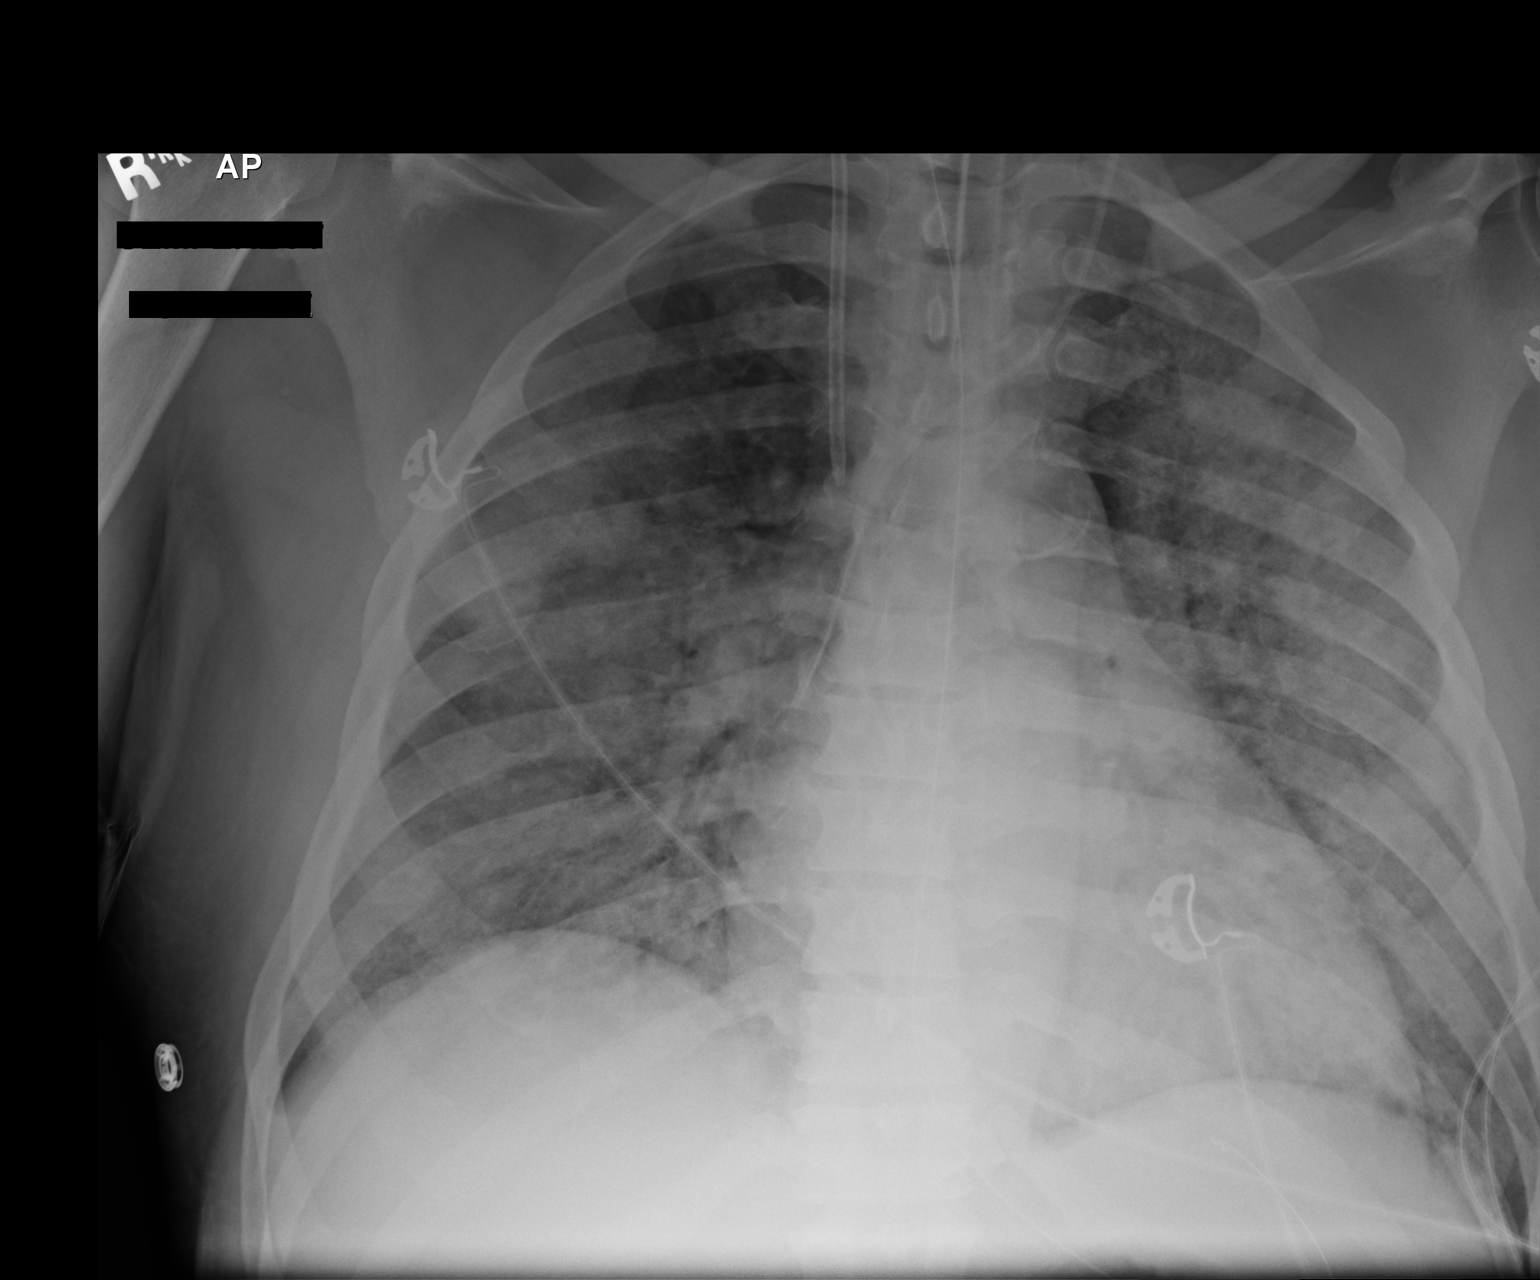

[1 of 1 positions shown; findings below may reference images not displayed]

FINDINGS: The right-sided central venous catheter, left-sided central venous
catheter, endotracheal tube, and enteric tube all appear similar.
Diffuse hazy bilateral airspace opacities are again noted. The
cardiac silhouette is increased in size but stable from prior study.
No pneumothorax. Trace bilateral pleural effusions are suspected,
however the left costophrenic angle is not fully visualized.
IMPRESSION: Stable lines and tubes.  No significant interval change.

## 2019-11-02 IMAGING — DX PORTABLE CHEST - 1 VIEW
1 series · 1 of 1 positions shown · non-contrast
Comparison: Yesterday

CLINICAL DATA: Endotracheal tube

EXAM:
PORTABLE CHEST 1 VIEW

[chest ap]
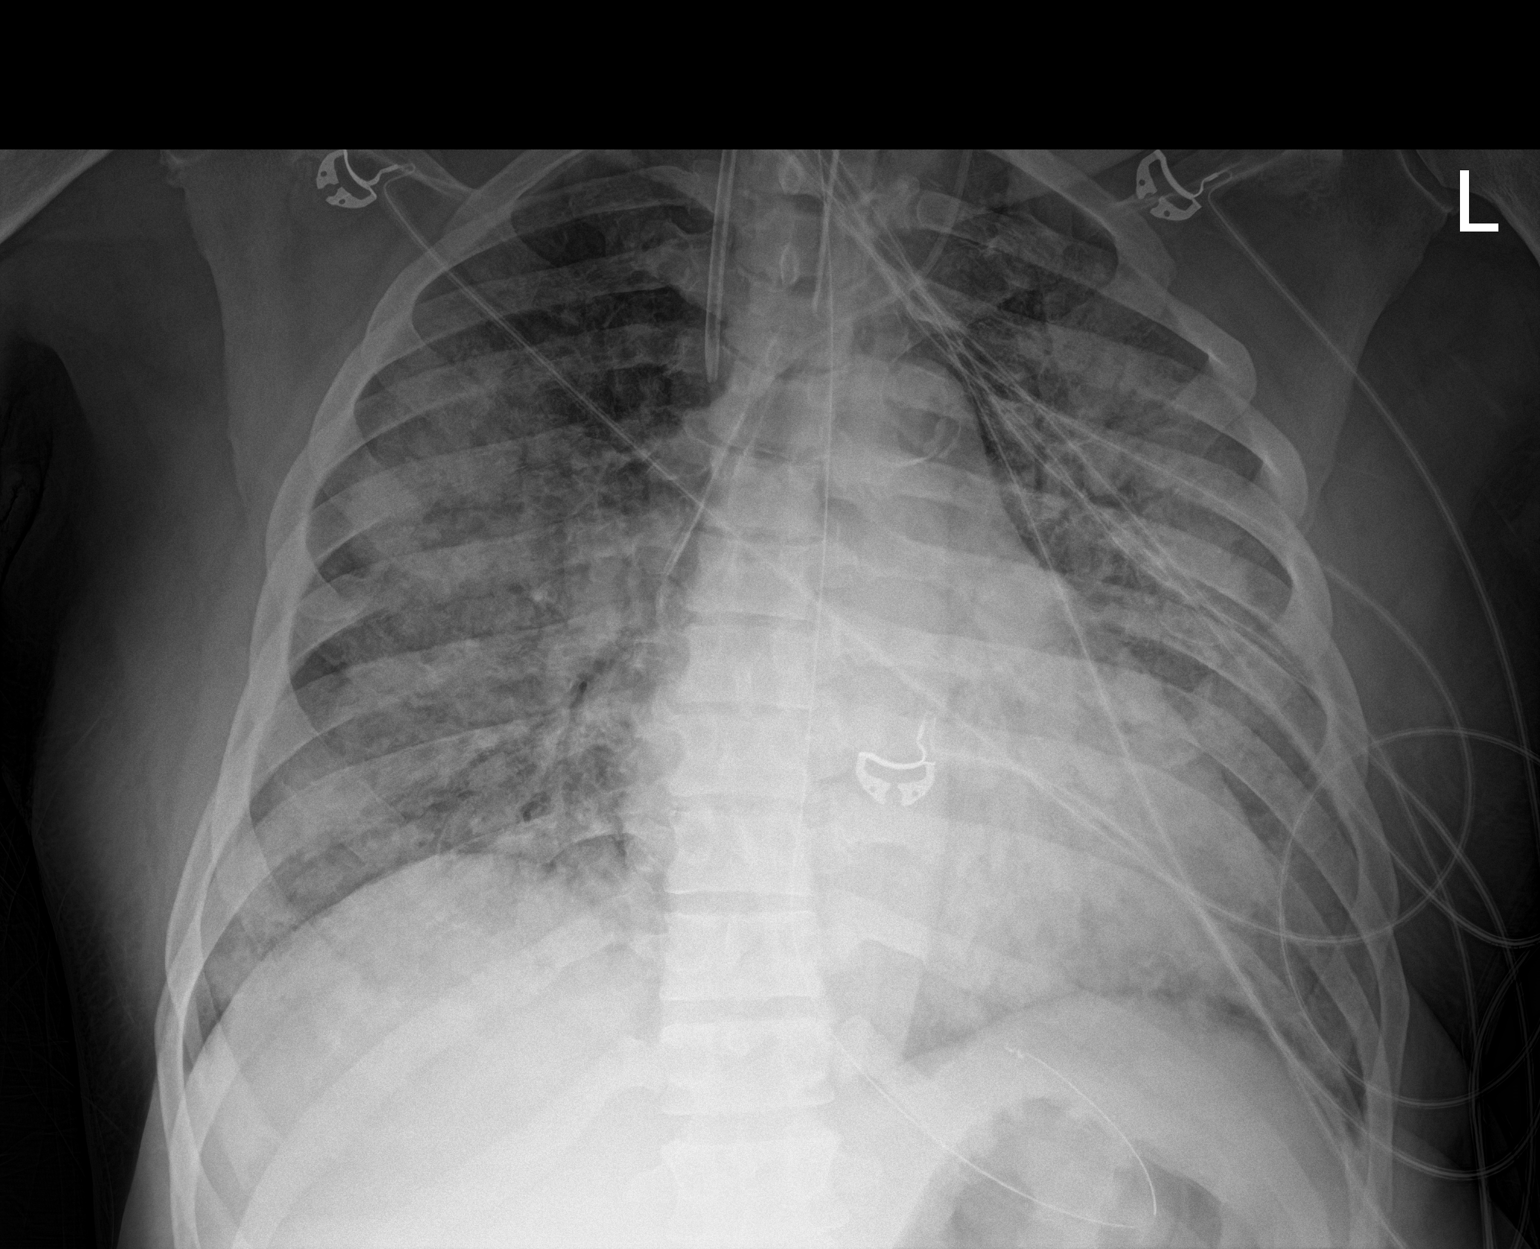

[1 of 1 positions shown; findings below may reference images not displayed]

FINDINGS: Endotracheal tube tip between the clavicular heads and carina. The
orogastric tube tip is at the proximal stomach. Bilateral central
line in stable good position. Extensive bilateral airspace disease
without improvement. Cardiomegaly.
IMPRESSION: Stable hardware positioning and symmetric, extensive airspace
disease.
